# Patient Record
Sex: Female | Born: 1945
Health system: Southern US, Community
[De-identification: ages and names within clinical notes are randomized; demographics above are authoritative.]

## PROBLEM LIST (undated history)

## (undated) DIAGNOSIS — N952 Postmenopausal atrophic vaginitis: Secondary | ICD-10-CM

## (undated) DIAGNOSIS — M109 Gout, unspecified: Secondary | ICD-10-CM

## (undated) DIAGNOSIS — M797 Fibromyalgia: Secondary | ICD-10-CM

## (undated) DIAGNOSIS — N393 Stress incontinence (female) (male): Secondary | ICD-10-CM

## (undated) DIAGNOSIS — M1990 Unspecified osteoarthritis, unspecified site: Secondary | ICD-10-CM

## (undated) DIAGNOSIS — Z1371 Encounter for nonprocreative screening for genetic disease carrier status: Secondary | ICD-10-CM

## (undated) DIAGNOSIS — R0602 Shortness of breath: Secondary | ICD-10-CM

## (undated) DIAGNOSIS — H8109 Meniere's disease, unspecified ear: Secondary | ICD-10-CM

## (undated) DIAGNOSIS — G459 Transient cerebral ischemic attack, unspecified: Secondary | ICD-10-CM

## (undated) DIAGNOSIS — I499 Cardiac arrhythmia, unspecified: Secondary | ICD-10-CM

## (undated) DIAGNOSIS — T7840XA Allergy, unspecified, initial encounter: Secondary | ICD-10-CM

## (undated) DIAGNOSIS — K219 Gastro-esophageal reflux disease without esophagitis: Secondary | ICD-10-CM

## (undated) DIAGNOSIS — E785 Hyperlipidemia, unspecified: Secondary | ICD-10-CM

## (undated) DIAGNOSIS — R42 Dizziness and giddiness: Secondary | ICD-10-CM

## (undated) DIAGNOSIS — E119 Type 2 diabetes mellitus without complications: Secondary | ICD-10-CM

## (undated) DIAGNOSIS — Z803 Family history of malignant neoplasm of breast: Secondary | ICD-10-CM

## (undated) DIAGNOSIS — H269 Unspecified cataract: Secondary | ICD-10-CM

## (undated) DIAGNOSIS — Z87442 Personal history of urinary calculi: Secondary | ICD-10-CM

## (undated) DIAGNOSIS — R002 Palpitations: Secondary | ICD-10-CM

## (undated) DIAGNOSIS — K449 Diaphragmatic hernia without obstruction or gangrene: Secondary | ICD-10-CM

## (undated) DIAGNOSIS — I10 Essential (primary) hypertension: Secondary | ICD-10-CM

## (undated) DIAGNOSIS — I639 Cerebral infarction, unspecified: Secondary | ICD-10-CM

## (undated) HISTORY — PX: TUBAL LIGATION: SHX77

## (undated) HISTORY — DX: Encounter for nonprocreative screening for genetic disease carrier status: Z13.71

## (undated) HISTORY — DX: Postmenopausal atrophic vaginitis: N95.2

## (undated) HISTORY — DX: Hyperlipidemia, unspecified: E78.5

## (undated) HISTORY — DX: Gout, unspecified: M10.9

## (undated) HISTORY — PX: EYE SURGERY: SHX253

## (undated) HISTORY — DX: Unspecified osteoarthritis, unspecified site: M19.90

## (undated) HISTORY — DX: Family history of malignant neoplasm of breast: Z80.3

## (undated) HISTORY — DX: Stress incontinence (female) (male): N39.3

## (undated) HISTORY — PX: TONSILLECTOMY: SUR1361

## (undated) HISTORY — DX: Diaphragmatic hernia without obstruction or gangrene: K44.9

## (undated) HISTORY — PX: JOINT REPLACEMENT: SHX530

## (undated) HISTORY — PX: OOPHORECTOMY: SHX86

## (undated) HISTORY — DX: Allergy, unspecified, initial encounter: T78.40XA

## (undated) HISTORY — DX: Palpitations: R00.2

## (undated) HISTORY — PX: BREAST SURGERY: SHX581

## (undated) HISTORY — DX: Essential (primary) hypertension: I10

## (undated) HISTORY — PX: KNEE ARTHROSCOPY: SHX127

## (undated) HISTORY — PX: CHOLECYSTECTOMY: SHX55

## (undated) HISTORY — DX: Unspecified cataract: H26.9

## (undated) HISTORY — DX: Fibromyalgia: M79.7

## (undated) HISTORY — DX: Meniere's disease, unspecified ear: H81.09

## (undated) HISTORY — PX: SPINE SURGERY: SHX786

## (undated) HISTORY — DX: Transient cerebral ischemic attack, unspecified: G45.9

---

## 1992-05-31 HISTORY — PX: ABDOMINAL HYSTERECTOMY: SHX81

## 1992-05-31 HISTORY — PX: CYSTOCELE REPAIR: SHX163

## 1997-11-19 ENCOUNTER — Ambulatory Visit (HOSPITAL_COMMUNITY): Admission: RE | Admit: 1997-11-19 | Discharge: 1997-11-19 | Payer: Self-pay

## 1998-05-01 ENCOUNTER — Ambulatory Visit (HOSPITAL_COMMUNITY): Admission: RE | Admit: 1998-05-01 | Discharge: 1998-05-01 | Payer: Self-pay | Admitting: Gastroenterology

## 1998-07-24 ENCOUNTER — Ambulatory Visit (HOSPITAL_COMMUNITY): Admission: RE | Admit: 1998-07-24 | Discharge: 1998-07-24 | Payer: Self-pay | Admitting: Gastroenterology

## 1998-07-24 ENCOUNTER — Encounter: Payer: Self-pay | Admitting: Gastroenterology

## 1999-06-01 HISTORY — PX: CHOLECYSTECTOMY, LAPAROSCOPIC: SHX56

## 2000-02-10 ENCOUNTER — Encounter: Payer: Self-pay | Admitting: General Surgery

## 2000-02-11 ENCOUNTER — Ambulatory Visit (HOSPITAL_COMMUNITY): Admission: RE | Admit: 2000-02-11 | Discharge: 2000-02-12 | Payer: Self-pay | Admitting: General Surgery

## 2000-02-11 ENCOUNTER — Encounter (INDEPENDENT_AMBULATORY_CARE_PROVIDER_SITE_OTHER): Payer: Self-pay

## 2000-05-13 ENCOUNTER — Other Ambulatory Visit: Admission: RE | Admit: 2000-05-13 | Discharge: 2000-05-13 | Payer: Self-pay | Admitting: Gastroenterology

## 2000-05-13 ENCOUNTER — Encounter (INDEPENDENT_AMBULATORY_CARE_PROVIDER_SITE_OTHER): Payer: Self-pay | Admitting: Specialist

## 2001-05-31 HISTORY — PX: OTHER SURGICAL HISTORY: SHX169

## 2002-07-31 ENCOUNTER — Encounter: Payer: Self-pay | Admitting: General Surgery

## 2002-07-31 ENCOUNTER — Encounter: Admission: RE | Admit: 2002-07-31 | Discharge: 2002-07-31 | Payer: Self-pay | Admitting: General Surgery

## 2002-08-07 ENCOUNTER — Other Ambulatory Visit: Admission: RE | Admit: 2002-08-07 | Discharge: 2002-08-07 | Payer: Self-pay | Admitting: Obstetrics and Gynecology

## 2002-11-19 ENCOUNTER — Encounter: Payer: Self-pay | Admitting: Emergency Medicine

## 2002-11-19 ENCOUNTER — Emergency Department (HOSPITAL_COMMUNITY): Admission: EM | Admit: 2002-11-19 | Discharge: 2002-11-19 | Payer: Self-pay | Admitting: Emergency Medicine

## 2003-06-01 HISTORY — PX: CARPAL TUNNEL RELEASE: SHX101

## 2003-09-06 ENCOUNTER — Other Ambulatory Visit: Admission: RE | Admit: 2003-09-06 | Discharge: 2003-09-06 | Payer: Self-pay | Admitting: Obstetrics and Gynecology

## 2004-09-21 ENCOUNTER — Ambulatory Visit: Payer: Self-pay | Admitting: Internal Medicine

## 2004-09-23 ENCOUNTER — Other Ambulatory Visit: Admission: RE | Admit: 2004-09-23 | Discharge: 2004-09-23 | Payer: Self-pay | Admitting: Obstetrics and Gynecology

## 2004-09-28 ENCOUNTER — Ambulatory Visit: Payer: Self-pay | Admitting: Internal Medicine

## 2005-02-12 ENCOUNTER — Ambulatory Visit: Payer: Self-pay | Admitting: Internal Medicine

## 2005-05-18 ENCOUNTER — Encounter: Admission: RE | Admit: 2005-05-18 | Discharge: 2005-05-18 | Payer: Self-pay

## 2005-05-31 HISTORY — PX: OTHER SURGICAL HISTORY: SHX169

## 2005-09-28 ENCOUNTER — Other Ambulatory Visit: Admission: RE | Admit: 2005-09-28 | Discharge: 2005-09-28 | Payer: Self-pay | Admitting: Obstetrics and Gynecology

## 2005-11-25 ENCOUNTER — Ambulatory Visit: Payer: Self-pay | Admitting: Internal Medicine

## 2005-12-02 ENCOUNTER — Ambulatory Visit: Payer: Self-pay | Admitting: Internal Medicine

## 2006-02-25 ENCOUNTER — Emergency Department (HOSPITAL_COMMUNITY): Admission: EM | Admit: 2006-02-25 | Discharge: 2006-02-25 | Payer: Self-pay | Admitting: Family Medicine

## 2006-03-03 ENCOUNTER — Ambulatory Visit: Payer: Self-pay | Admitting: Internal Medicine

## 2006-03-17 ENCOUNTER — Inpatient Hospital Stay (HOSPITAL_COMMUNITY): Admission: RE | Admit: 2006-03-17 | Discharge: 2006-03-19 | Payer: Self-pay | Admitting: Orthopedic Surgery

## 2006-10-11 ENCOUNTER — Other Ambulatory Visit: Admission: RE | Admit: 2006-10-11 | Discharge: 2006-10-11 | Payer: Self-pay | Admitting: Obstetrics and Gynecology

## 2006-10-18 ENCOUNTER — Encounter: Admission: RE | Admit: 2006-10-18 | Discharge: 2006-10-18 | Payer: Self-pay | Admitting: Obstetrics and Gynecology

## 2006-10-18 ENCOUNTER — Encounter (INDEPENDENT_AMBULATORY_CARE_PROVIDER_SITE_OTHER): Payer: Self-pay | Admitting: Diagnostic Radiology

## 2006-12-22 ENCOUNTER — Ambulatory Visit: Payer: Self-pay | Admitting: Internal Medicine

## 2006-12-22 LAB — CONVERTED CEMR LAB
ALT: 56 units/L — ABNORMAL HIGH (ref 0–35)
AST: 41 units/L — ABNORMAL HIGH (ref 0–37)
Bacteria, UA: NEGATIVE
Bilirubin, Direct: 0.1 mg/dL (ref 0.0–0.3)
CO2: 27 meq/L (ref 19–32)
Calcium: 9.6 mg/dL (ref 8.4–10.5)
Chloride: 102 meq/L (ref 96–112)
Crystals: NEGATIVE
Eosinophils Relative: 1.9 % (ref 0.0–5.0)
GFR calc non Af Amer: 77 mL/min
Glucose, Bld: 126 mg/dL — ABNORMAL HIGH (ref 70–99)
HCT: 45.6 % (ref 36.0–46.0)
Hemoglobin, Urine: NEGATIVE
LDL Cholesterol: 118 mg/dL — ABNORMAL HIGH (ref 0–99)
MCV: 80.8 fL (ref 78.0–100.0)
Neutrophils Relative %: 51.4 % (ref 43.0–77.0)
RBC: 5.64 M/uL — ABNORMAL HIGH (ref 3.87–5.11)
RDW: 12.9 % (ref 11.5–14.6)
Total CHOL/HDL Ratio: 6.8
Total Protein, Urine: NEGATIVE mg/dL
WBC: 8.2 10*3/uL (ref 4.5–10.5)

## 2006-12-28 ENCOUNTER — Ambulatory Visit: Payer: Self-pay | Admitting: Internal Medicine

## 2007-05-04 ENCOUNTER — Ambulatory Visit: Payer: Self-pay | Admitting: Internal Medicine

## 2007-05-05 ENCOUNTER — Encounter: Payer: Self-pay | Admitting: Internal Medicine

## 2007-08-19 ENCOUNTER — Encounter: Payer: Self-pay | Admitting: *Deleted

## 2007-08-19 DIAGNOSIS — J45909 Unspecified asthma, uncomplicated: Secondary | ICD-10-CM | POA: Insufficient documentation

## 2007-08-19 DIAGNOSIS — J309 Allergic rhinitis, unspecified: Secondary | ICD-10-CM | POA: Insufficient documentation

## 2007-08-19 DIAGNOSIS — R5381 Other malaise: Secondary | ICD-10-CM | POA: Insufficient documentation

## 2007-08-19 DIAGNOSIS — K589 Irritable bowel syndrome without diarrhea: Secondary | ICD-10-CM | POA: Insufficient documentation

## 2007-08-19 DIAGNOSIS — K449 Diaphragmatic hernia without obstruction or gangrene: Secondary | ICD-10-CM

## 2007-08-19 DIAGNOSIS — K219 Gastro-esophageal reflux disease without esophagitis: Secondary | ICD-10-CM | POA: Insufficient documentation

## 2007-08-19 DIAGNOSIS — R5383 Other fatigue: Secondary | ICD-10-CM

## 2007-08-19 DIAGNOSIS — I10 Essential (primary) hypertension: Secondary | ICD-10-CM | POA: Insufficient documentation

## 2007-08-19 DIAGNOSIS — IMO0002 Reserved for concepts with insufficient information to code with codable children: Secondary | ICD-10-CM | POA: Insufficient documentation

## 2007-08-19 DIAGNOSIS — IMO0001 Reserved for inherently not codable concepts without codable children: Secondary | ICD-10-CM | POA: Insufficient documentation

## 2007-08-19 DIAGNOSIS — Z9089 Acquired absence of other organs: Secondary | ICD-10-CM | POA: Insufficient documentation

## 2007-08-19 DIAGNOSIS — Z9889 Other specified postprocedural states: Secondary | ICD-10-CM | POA: Insufficient documentation

## 2007-11-02 LAB — CONVERTED CEMR LAB

## 2007-11-24 ENCOUNTER — Other Ambulatory Visit: Admission: RE | Admit: 2007-11-24 | Discharge: 2007-11-24 | Payer: Self-pay | Admitting: Obstetrics and Gynecology

## 2007-11-29 ENCOUNTER — Encounter: Admission: RE | Admit: 2007-11-29 | Discharge: 2007-11-29 | Payer: Self-pay | Admitting: Obstetrics and Gynecology

## 2008-02-06 ENCOUNTER — Ambulatory Visit: Payer: Self-pay | Admitting: Internal Medicine

## 2008-02-06 LAB — CONVERTED CEMR LAB
ALT: 45 units/L — ABNORMAL HIGH (ref 0–35)
AST: 30 units/L (ref 0–37)
Basophils Absolute: 0 10*3/uL (ref 0.0–0.1)
Bilirubin Urine: NEGATIVE
Bilirubin, Direct: 0.2 mg/dL (ref 0.0–0.3)
CO2: 27 meq/L (ref 19–32)
Chloride: 105 meq/L (ref 96–112)
Eosinophils Absolute: 0.2 10*3/uL (ref 0.0–0.7)
GFR calc non Af Amer: 67 mL/min
Hemoglobin, Urine: NEGATIVE
Ketones, ur: NEGATIVE mg/dL
LDL Cholesterol: 93 mg/dL (ref 0–99)
Lymphocytes Relative: 34.6 % (ref 12.0–46.0)
MCHC: 34.4 g/dL (ref 30.0–36.0)
Mucus, UA: NEGATIVE
Neutrophils Relative %: 54.4 % (ref 43.0–77.0)
Platelets: 301 10*3/uL (ref 150–400)
RBC: 5.27 M/uL — ABNORMAL HIGH (ref 3.87–5.11)
RDW: 12.5 % (ref 11.5–14.6)
Sodium: 142 meq/L (ref 135–145)
Specific Gravity, Urine: 1.02 (ref 1.000–1.03)
TSH: 1.58 microintl units/mL (ref 0.35–5.50)
Total Bilirubin: 1.3 mg/dL — ABNORMAL HIGH (ref 0.3–1.2)
Total CHOL/HDL Ratio: 5.6
Triglycerides: 173 mg/dL — ABNORMAL HIGH (ref 0–149)
Urobilinogen, UA: 0.2 (ref 0.0–1.0)
VLDL: 35 mg/dL (ref 0–40)

## 2008-02-13 ENCOUNTER — Ambulatory Visit: Payer: Self-pay | Admitting: Internal Medicine

## 2008-02-13 DIAGNOSIS — R079 Chest pain, unspecified: Secondary | ICD-10-CM | POA: Insufficient documentation

## 2008-02-19 ENCOUNTER — Ambulatory Visit: Payer: Self-pay

## 2008-02-19 ENCOUNTER — Encounter: Payer: Self-pay | Admitting: Internal Medicine

## 2008-07-30 ENCOUNTER — Ambulatory Visit: Payer: Self-pay | Admitting: Internal Medicine

## 2008-07-30 DIAGNOSIS — H9190 Unspecified hearing loss, unspecified ear: Secondary | ICD-10-CM | POA: Insufficient documentation

## 2008-07-30 DIAGNOSIS — H8309 Labyrinthitis, unspecified ear: Secondary | ICD-10-CM

## 2008-07-30 DIAGNOSIS — H8109 Meniere's disease, unspecified ear: Secondary | ICD-10-CM | POA: Insufficient documentation

## 2008-08-06 ENCOUNTER — Encounter: Payer: Self-pay | Admitting: Internal Medicine

## 2008-08-08 ENCOUNTER — Encounter: Payer: Self-pay | Admitting: Internal Medicine

## 2008-09-09 ENCOUNTER — Encounter: Payer: Self-pay | Admitting: Internal Medicine

## 2008-09-12 ENCOUNTER — Encounter: Payer: Self-pay | Admitting: Internal Medicine

## 2008-09-18 ENCOUNTER — Encounter: Payer: Self-pay | Admitting: Internal Medicine

## 2008-10-30 LAB — CONVERTED CEMR LAB: Pap Smear: NORMAL

## 2008-11-25 ENCOUNTER — Other Ambulatory Visit: Admission: RE | Admit: 2008-11-25 | Discharge: 2008-11-25 | Payer: Self-pay | Admitting: Obstetrics and Gynecology

## 2008-11-25 ENCOUNTER — Ambulatory Visit: Payer: Self-pay | Admitting: Obstetrics and Gynecology

## 2008-11-25 ENCOUNTER — Encounter: Payer: Self-pay | Admitting: Obstetrics and Gynecology

## 2008-11-29 ENCOUNTER — Encounter: Admission: RE | Admit: 2008-11-29 | Discharge: 2008-11-29 | Payer: Self-pay | Admitting: Obstetrics and Gynecology

## 2008-12-05 ENCOUNTER — Encounter: Admission: RE | Admit: 2008-12-05 | Discharge: 2008-12-05 | Payer: Self-pay | Admitting: Obstetrics and Gynecology

## 2008-12-19 ENCOUNTER — Encounter: Admission: RE | Admit: 2008-12-19 | Discharge: 2008-12-19 | Payer: Self-pay | Admitting: Obstetrics and Gynecology

## 2009-02-03 ENCOUNTER — Emergency Department (HOSPITAL_COMMUNITY): Admission: EM | Admit: 2009-02-03 | Discharge: 2009-02-03 | Payer: Self-pay | Admitting: Family Medicine

## 2009-02-24 ENCOUNTER — Ambulatory Visit: Payer: Self-pay | Admitting: Internal Medicine

## 2009-02-24 LAB — CONVERTED CEMR LAB
AST: 23 units/L (ref 0–37)
Albumin: 4.1 g/dL (ref 3.5–5.2)
Alkaline Phosphatase: 76 units/L (ref 39–117)
Basophils Absolute: 0 10*3/uL (ref 0.0–0.1)
Bilirubin, Direct: 0.1 mg/dL (ref 0.0–0.3)
CO2: 29 meq/L (ref 19–32)
Calcium: 9 mg/dL (ref 8.4–10.5)
Creatinine, Ser: 0.7 mg/dL (ref 0.4–1.2)
Eosinophils Absolute: 0.2 10*3/uL (ref 0.0–0.7)
GFR calc non Af Amer: 89.46 mL/min (ref >60)
Glucose, Bld: 121 mg/dL — ABNORMAL HIGH (ref 70–99)
HDL: 28.8 mg/dL — ABNORMAL LOW (ref >39.00)
Hemoglobin: 14.1 g/dL (ref 12.0–15.0)
Ketones, ur: NEGATIVE mg/dL
Lymphocytes Relative: 35.9 % (ref 12.0–46.0)
MCHC: 33.6 g/dL (ref 30.0–36.0)
Monocytes Relative: 9.7 % (ref 3.0–12.0)
Neutrophils Relative %: 50.5 % (ref 43.0–77.0)
Platelets: 237 10*3/uL (ref 150.0–400.0)
RDW: 12.7 % (ref 11.5–14.6)
Sodium: 144 meq/L (ref 135–145)
Specific Gravity, Urine: <=1.005 (ref 1.000–1.030)
Total Bilirubin: 1.2 mg/dL (ref 0.3–1.2)
Total Protein, Urine: NEGATIVE mg/dL
Triglycerides: 150 mg/dL — ABNORMAL HIGH (ref 0.0–149.0)
Urine Glucose: NEGATIVE mg/dL
Urobilinogen, UA: 0.2 (ref 0.0–1.0)
VLDL: 30 mg/dL (ref 0.0–40.0)
pH: 6 (ref 5.0–8.0)

## 2009-02-28 ENCOUNTER — Ambulatory Visit: Payer: Self-pay | Admitting: Internal Medicine

## 2009-02-28 LAB — CONVERTED CEMR LAB: Hgb A1c MFr Bld: 6.5 % (ref 4.6–6.5)

## 2009-04-30 ENCOUNTER — Ambulatory Visit: Payer: Self-pay | Admitting: Obstetrics and Gynecology

## 2009-08-26 ENCOUNTER — Ambulatory Visit: Payer: Self-pay | Admitting: Internal Medicine

## 2010-03-19 ENCOUNTER — Encounter: Admission: RE | Admit: 2010-03-19 | Discharge: 2010-03-19 | Payer: Self-pay | Admitting: Obstetrics and Gynecology

## 2010-03-24 ENCOUNTER — Other Ambulatory Visit: Admission: RE | Admit: 2010-03-24 | Discharge: 2010-03-24 | Payer: Self-pay | Admitting: Obstetrics and Gynecology

## 2010-03-24 ENCOUNTER — Ambulatory Visit: Payer: Self-pay | Admitting: Obstetrics and Gynecology

## 2010-04-28 ENCOUNTER — Encounter: Payer: Self-pay | Admitting: Internal Medicine

## 2010-04-28 ENCOUNTER — Ambulatory Visit: Payer: Self-pay | Admitting: Internal Medicine

## 2010-04-28 DIAGNOSIS — E119 Type 2 diabetes mellitus without complications: Secondary | ICD-10-CM | POA: Insufficient documentation

## 2010-04-28 DIAGNOSIS — M109 Gout, unspecified: Secondary | ICD-10-CM | POA: Insufficient documentation

## 2010-04-28 LAB — CONVERTED CEMR LAB
Basophils Absolute: 0 10*3/uL (ref 0.0–0.1)
CO2: 28 meq/L (ref 19–32)
Calcium: 9.3 mg/dL (ref 8.4–10.5)
Creatinine, Ser: 0.8 mg/dL (ref 0.4–1.2)
Eosinophils Relative: 0.7 % (ref 0.0–5.0)
Glucose, Bld: 119 mg/dL — ABNORMAL HIGH (ref 70–99)
HDL: 38.4 mg/dL — ABNORMAL LOW (ref >39.00)
Hemoglobin: 16.1 g/dL — ABNORMAL HIGH (ref 12.0–15.0)
Hgb A1c MFr Bld: 6.5 % (ref 4.6–6.5)
Lymphocytes Relative: 27.6 % (ref 12.0–46.0)
Monocytes Relative: 6.7 % (ref 3.0–12.0)
Neutro Abs: 7.7 10*3/uL (ref 1.4–7.7)
RDW: 12.9 % (ref 11.5–14.6)
Total CHOL/HDL Ratio: 4
Triglycerides: 108 mg/dL (ref 0.0–149.0)
VLDL: 21.6 mg/dL (ref 0.0–40.0)
WBC: 11.9 10*3/uL — ABNORMAL HIGH (ref 4.5–10.5)

## 2010-04-29 ENCOUNTER — Encounter (INDEPENDENT_AMBULATORY_CARE_PROVIDER_SITE_OTHER): Payer: Self-pay | Admitting: *Deleted

## 2010-05-07 ENCOUNTER — Encounter: Payer: Self-pay | Admitting: Internal Medicine

## 2010-05-28 ENCOUNTER — Encounter (INDEPENDENT_AMBULATORY_CARE_PROVIDER_SITE_OTHER): Payer: Self-pay

## 2010-06-02 ENCOUNTER — Ambulatory Visit
Admission: RE | Admit: 2010-06-02 | Discharge: 2010-06-02 | Payer: Self-pay | Source: Home / Self Care | Attending: Gastroenterology | Admitting: Gastroenterology

## 2010-06-16 ENCOUNTER — Ambulatory Visit
Admission: RE | Admit: 2010-06-16 | Discharge: 2010-06-16 | Payer: Self-pay | Source: Home / Self Care | Attending: Gastroenterology | Admitting: Gastroenterology

## 2010-06-16 ENCOUNTER — Other Ambulatory Visit: Payer: Self-pay | Admitting: Gastroenterology

## 2010-06-22 ENCOUNTER — Encounter: Payer: Self-pay | Admitting: Obstetrics and Gynecology

## 2010-06-22 ENCOUNTER — Encounter: Payer: Self-pay | Admitting: Gastroenterology

## 2010-06-30 NOTE — Letter (Signed)
Summary: Pre Visit Letter Revised  [facility]  [address] [place], [place] [address]   Phone: [phone]  Fax: [phone]        04/29/2010 MRN: [id number] [facility] [address] [place], [place]  [address]             Procedure Date:  06/16/2010  Welcome to the Gastroenterology Division at [facility].    You are scheduled to see a nurse for your pre-procedure visit on 06/02/2010 at 2:00pm on the 3rd floor at [facility], [address]  We ask that you try to arrive at our office 15 minutes prior to your appointment time to allow for check-in.  Please take a minute to review the attached form.  If you answer "Yes" to one or more of the questions on the first page, we ask that you call the person listed at your earliest opportunity.  If you answer "No" to all of the questions, please complete the rest of the form and bring it to your appointment.    Your nurse visit will consist of discussing your medical and surgical history, your immediate family medical history, and your medications.   If you are unable to list all of your medications on the form, please bring the medication bottles to your appointment and we will list them.  We will need to be aware of both prescribed and over the counter drugs.  We will need to know exact dosage information as well.    Please be prepared to read and sign documents such as consent forms, a financial agreement, and acknowledgement forms.  If necessary, and with your consent, a friend or relative is welcome to sit-in on the nurse visit with you.  Please bring your insurance card so that we may make a copy of it.  If your insurance requires a referral to see a specialist, please bring your referral form from your primary care physician.  No co-pay is required for this nurse visit.     If you cannot keep your appointment, please call [phone] to cancel or reschedule prior to your appointment date.  This  allows [place] the opportunity to schedule an appointment for another patient in need of care.    Thank you for choosing [facility] for your medical needs.  We appreciate the opportunity to care for you.  Please visit [place] at our website  to learn more about our practice.  Sincerely, The Gastroenterology Division

## 2010-06-30 NOTE — Assessment & Plan Note (Signed)
Summary: YEARLY--STC   Vital Signs:  Patient profile:   65 year old female Height:      65 inches Weight:      207 pounds BMI:     34.57 O2 Sat:      97 % on Room air Temp:     98.3 degrees F oral Pulse rate:   88 / minute BP sitting:   112 / 84  (left arm) Cuff size:   large  Vitals Entered By: Bill Salinas CMA (April 28, 2010 9:46 AM)  O2 Flow:  Room air  Primary Care Provider:  Jacques Navy MD   History of Present Illness: Interval history - gout in right foot diagnosed by Dr. Dierdre Forth. She has seen her gynecologist and had a normal exam. she has had a normal mammogram. she reports that she has been feeling a little draggy. She is worried about her blood sugar.   She remains independent in all ADLs. She has no signs of depression. Still managing all the household finances and helps with her sons financial paperwork.   She has had a couple of falls - more loss of footing. No LOC. She still has some dizziness but better since stopping nabumetone. She still will have balance problems. She had brain imaging for balance problems  10 years + ago with no changes noted.   Preventive Screening-Counseling & Management  Alcohol-Tobacco     Alcohol drinks/day: <1     >5/day in last 3 mos: yes     Smoking Status: never  Caffeine-Diet-Exercise     Caffeine use/day: only on occasions     Diet Comments: on a healthy low sugar diet     Does Patient Exercise: no  Hep-HIV-STD-Contraception     Hepatitis Risk: no risk noted     HIV Risk: no risk noted     STD Risk: no risk noted     Dental Visit-last 6 months yes     Sun Exposure-Excessive: no  Safety-Violence-Falls     Seat Belt Use: yes     Helmet Use: n/a     Firearms in the Home: firearms in the home     Smoke Detectors: yes     Violence in the Home: no risk noted     Sexual Abuse: no     Fall Risk: moderate fall risk      Sexual History:  currently monogamous.        Drug Use:  never.        Blood Transfusions:   no.    Current Medications (verified): 1)  Diltiazem Hcl Cr 240 Mg Cp24 (Diltiazem Hcl) .... Take 1 Tablet By Mouth Once A Day 2)  Fexofenadine Hcl 180 Mg Tabs (Fexofenadine Hcl) .... Take 1 Tablet By Mouth Once A Day 3)  Furosemide 40 Mg Tabs (Furosemide) .... Take 1 Tablet By Mouth Once A Day 4)  Nortriptyline Hcl 25 Mg  Caps (Nortriptyline Hcl) .... Take 3 Tablets At Bedtime 5)  Astelin 137 Mcg/spray  Soln (Azelastine Hcl) .... Use Two Times A Day 6)  Nasacort Aq 55 Mcg/act  Aers (Triamcinolone Acetonide(Nasal)) .... Use Once Daily 7)  Premarin 0.625 Mg/gm  Crea (Estrogens, Conjugated) .... Use 3 Times Weekly 8)  Patanol 0.1 %  Soln (Olopatadine Hcl) .... Use Daily As Directed. 9)  Omeprazole 40 Mg Cpdr (Omeprazole) .Marland Kitchen.. 1 By Mouth Q Am 10)  Ranitidine Hcl 150 Mg  Caps (Ranitidine Hcl) .... Take One Pm 11)  Epipen 0.3 Mg/0.21ml (  1:1000)  Devi (Epinephrine Hcl (Anaphylaxis)) .... Use As Needed and As Directed. 12)  Proair Hfa 108 (90 Base) Mcg/act Aers (Albuterol Sulfate) .... Inhale 1-2 Puffs As Needed 13)  Losartan Potassium 50 Mg Tabs (Losartan Potassium) .Marland Kitchen.. 1 By Mouth Q Am 14)  Flexeril 5 Mg Tabs (Cyclobenzaprine Hcl) .... 1/2 To 1 Tab At Bedtime 15)  Levocetirizine Dihydrochloride 5 Mg Tabs (Levocetirizine Dihydrochloride) .Marland Kitchen.. 1 Tablet Daily 16)  Dulera 100-5 Mcg/act Aero (Mometasone Furo-Formoterol Fum) .... 2 Puffs Two Times A Day  Allergies (verified): 1)  ! * Ivp Dye 2)  ! Iodine 3)  ! Sulfa  Past History:  Past Medical History: Last updated: 08/19/2007 ACID REFLUX DISEASE (ICD-530.81) * T11-12 DISK HERNIATION,L3-4 DISK HERNIATION FATIGUE, CHRONIC (ICD-780.79) FIBROMYALGIA (ICD-729.1) CATARACTS PROGRESSIVE (ICD-366.9) DEGENERATIVE DISC DISEASE (ICD-722.6) IRRITABLE BOWEL SYNDROME (ICD-564.1) ASTHMA (ICD-493.90) HIATAL HERNIA (ICD-553.3) ALLERGIC RHINITIS (ICD-477.9) HYPERTENSION (ICD-401.9) OTHER ABNORMAL BLOOD CHEMISTRY (ICD-790.6)  Past Surgical History: Last  updated: 08/19/2007 * REPAIR OF TORN POSTERIOR TIBAL TENDON LEFT FOOT * 4 SCREWS IN LEFT ANKE AFTER FRACTURE. BREAST BIOPSY, HX OF BENIGN (ICD-V15.9) CARPAL TUNNEL RELEASE, RIGHT, HX OF (ICD-V45.89) CHOLECYSTECTOMY, LAPAROSCOPIC, HX OF (ICD-V45.79) * TAH WITH BILATERAL SALPING-OOPHORECTOMY  Family History: Last updated: 11-Mar-2008 Father - deceased @ 43: malignant brain tumors Mother - deceased @ 71: h/o breast cancer; developed Sq cell carcinoma Neg-colon cancer; CAD Sister - breast cancer survivor 2nd kin with DM  Social History: Last updated: 03/11/08 HSG married '69 2 sons - '70, '72:  retired - [employer] SO- good health  Social History: Caffeine use/day:  only on occasions Does Patient Exercise:  no Dental Care w/in 6 mos.:  yes Sun Exposure-Excessive:  no Seat Belt Use:  yes Fall Risk:  moderate fall risk Hepatitis Risk:  no risk noted HIV Risk:  no risk noted STD Risk:  no risk noted Sexual History:  currently monogamous Drug Use:  never Blood Transfusions:  no  Review of Systems  The patient denies anorexia, fever, weight loss, weight gain, vision loss, decreased hearing, chest pain, syncope, dyspnea on exertion, prolonged cough, abdominal pain, severe indigestion/heartburn, incontinence, muscle weakness, difficulty walking, depression, and enlarged lymph nodes.         reflux controlled on PPI therapy  Physical Exam  General:  Well-developed,well-nourished,in no acute distress; alert,appropriate and cooperative throughout examination Head:  Normocephalic and atraumatic without obvious abnormalities. No apparent alopecia or balding. Eyes:  vision grossly intact, pupils equal, pupils round, and corneas and lenses clear.   Ears:  External ear exam shows no significant lesions or deformities.  Otoscopic examination reveals clear canals, tympanic membranes are intact bilaterally without bulging, retraction, inflammation or discharge. Hearing is grossly normal  bilaterally. Nose:  no external deformity and no external erythema.   Mouth:  Oral mucosa and oropharynx without lesions or exudates.  Teeth in good repair. Neck:  supple, full ROM, and no thyromegaly.   Chest Wall:  No deformities, masses, or tenderness noted. Breasts:  deferred to gyn Lungs:  Normal respiratory effort, chest expands symmetrically. Lungs are clear to auscultation, no crackles or wheezes. Heart:  normal rate, regular rhythm, no murmur, no gallop, and no JVD.   Abdomen:  soft, non-tender, normal bowel sounds, no guarding, and no hepatomegaly.   Genitalia:  deferred to gyn Msk:  normal ROM, no joint tenderness, no joint swelling, no joint warmth, and no redness over joints.  Right great toe looks normal Pulses:  2+ radial and DP pulses Extremities:  No clubbing, cyanosis, edema,  or deformity noted with normal full range of motion of all joints.   Neurologic:  alert & oriented X3, cranial nerves II-XII intact, sensation intact to pinprick, and DTRs symmetrical and normal.   Skin:  turgor normal, color normal, no rashes, and no suspicious lesions.  Surgical scar dorsum left foot Cervical Nodes:  no anterior cervical adenopathy and no posterior cervical adenopathy.   Psych:  Oriented X3, memory intact for recent and remote, normally interactive, good eye contact, and not anxious appearing.     Impression & Recommendations:  Problem # 1:  OTHER ABNORMAL GLUCOSE (ICD-790.29) h/o elevated blood sugar.  Plam - f/u lab.   Addendum - A1C 6.5% - consistent with diagnosis of diabetes, however below treatment goal of A1C 7% or less  Plan - life-style management with no sugar, low carb diet.  Problem # 2:  ACID REFLUX DISEASE (ICD-530.81) Patient's symptoms are well controlled on her medical regimen  Her updated medication list for this problem includes:    Omeprazole 40 Mg Cpdr (Omeprazole) .Marland Kitchen... 1 by mouth q am    Ranitidine Hcl 150 Mg Caps (Ranitidine hcl) .Marland Kitchen... Take one  pm  Problem # 3:  GOUT, UNSPECIFIED (ICD-274.9) Patient has not had any recent gout flares.  Plan - check Uric acid.  Orders: TLB-Uric Acid, Blood (84550-URIC)  Addendum - Uric Acid 7.2, just above normal range.  Plan - low purine diet          symptomatic treatment as necessary          if greater than two flares of gout n 12 months will consider adding prophylactic medication.  Problem # 4:  IRRITABLE BOWEL SYNDROME (ICD-564.1) stable without current complaints.  Plan - continue high fiber diet.  Problem # 5:  ASTHMA (ICD-493.90) Patients symptoms are well controlled. she does not use Albuterol rescue but every few months.  PLan - continue present regimen of dulera daily  Her updated medication list for this problem includes:    Proair Hfa 108 (90 Base) Mcg/act Aers (Albuterol sulfate) ..... Inhale 1-2 puffs as needed    Dulera 100-5 Mcg/act Aero (Mometasone furo-formoterol fum) .Marland Kitchen... 2 puffs two times a day  Problem # 6:  HYPERTENSION (ICD-401.9)  Her updated medication list for this problem includes:    Diltiazem Hcl Cr 240 Mg Cp24 (Diltiazem hcl) .Marland Kitchen... Take 1 tablet by mouth once a day    Furosemide 40 Mg Tabs (Furosemide) .Marland Kitchen... Take 1 tablet by mouth once a day    Losartan Potassium 50 Mg Tabs (Losartan potassium) .Marland Kitchen... 1 by mouth q am  Orders: TLB-BMP (Basic Metabolic Panel-BMET) (80048-METABOL)  BP today: 112/84 Prior BP: 122/80 (08/26/2009)  Good control. Will continue present medications.  Problem # 7:  LABYRINTHITIS, CHRONIC (ICD-386.30) A chronic problem that does affect her gait. As noted she has had neuroimaging in the past. Her neuro exam is non-focal.  Plan - counselled on fall risk           continue with meclizine prn  Problem # 8:  Preventive Health Care (ICD-V70.0) Interval hisotry is unremarkable. Physical exam today is normal. Lab results are in normal limits including LDL cholesterol of 104. Last mammogram on record is July '10; last  colonoscopy May '04. Immunizations - Flu  Sept '11, Pneumonia vaccine Sept '11. She should consider singles vaccine and will check her coverage. 12 Lead EKG negative for any signs of ischemia.  In summary - a very nice woman with multiple problems that all  seem stable at this time. She is current with health maintenance butis due for mammogram and shingles vaccine. She will return 1 year or as needed.   Orders: TLB-Lipid Panel (80061-LIPID) Welcome to Medicare, Physical [phone])  Complete Medication List: 1)  Diltiazem Hcl Cr 240 Mg Cp24 (Diltiazem hcl) .... Take 1 tablet by mouth once a day 2)  Fexofenadine Hcl 180 Mg Tabs (Fexofenadine hcl) .... Take 1 tablet by mouth once a day 3)  Furosemide 40 Mg Tabs (Furosemide) .... Take 1 tablet by mouth once a day 4)  Nortriptyline Hcl 25 Mg Caps (Nortriptyline hcl) .... Take 3 tablets at bedtime 5)  Astelin 137 Mcg/spray Soln (Azelastine hcl) .... Use two times a day 6)  Nasacort Aq 55 Mcg/act Aers (Triamcinolone acetonide(nasal)) .... Use once daily 7)  Premarin 0.625 Mg/gm Crea (Estrogens, conjugated) .... Use 3 times weekly 8)  Patanol 0.1 % Soln (Olopatadine hcl) .... Use daily as directed. 9)  Omeprazole 40 Mg Cpdr (Omeprazole) .Marland Kitchen.. 1 by mouth q am 10)  Ranitidine Hcl 150 Mg Caps (Ranitidine hcl) .... Take one pm 11)  Epipen 0.3 Mg/0.57ml (1:1000) Devi (Epinephrine hcl (anaphylaxis)) .... Use as needed and as directed. 12)  Proair Hfa 108 (90 Base) Mcg/act Aers (Albuterol sulfate) .... Inhale 1-2 puffs as needed 13)  Losartan Potassium 50 Mg Tabs (Losartan potassium) .Marland Kitchen.. 1 by mouth q am 14)  Flexeril 5 Mg Tabs (Cyclobenzaprine hcl) .... 1/2 to 1 tab at bedtime 15)  Levocetirizine Dihydrochloride 5 Mg Tabs (Levocetirizine dihydrochloride) .Marland Kitchen.. 1 tablet daily 16)  Dulera 100-5 Mcg/act Aero (Mometasone furo-formoterol fum) .... 2 puffs two times a day  Other Orders: TLB-CBC Platelet - w/Differential (85025-CBCD) TLB-A1C / Hgb A1C  (Glycohemoglobin) (83036-A1C) Gastroenterology Referral (GI) EKG w/ Interpretation (93000)  Patient: Kristy [name] Note: All result statuses are Final unless otherwise noted.  Tests: (1) CBC Platelet w/Diff (CBCD)   White Cell Count     [H]  11.9 K/uL                   4.5-10.5   Red Cell Count       [H]  5.51 Mil/uL                 3.87-5.11   Hemoglobin           [H]  16.1 g/dL                   82.9-56.2   Hematocrit           [H]  47.0 %                      36.0-46.0   MCV                       85.2 fl                     78.0-100.0   MCHC                      34.3 g/dL                   13.0-86.5   RDW                       12.9 %  11.5-14.6   Platelet Count            305.0 K/uL                  150.0-400.0   Neutrophil %              64.7 %                      43.0-77.0   Lymphocyte %              27.6 %                      12.0-46.0   Monocyte %                6.7 %                       3.0-12.0   Eosinophils%              0.7 %                       0.0-5.0   Basophils %               0.3 %                       0.0-3.0   Neutrophill Absolute      7.7 K/uL                    1.4-7.7   Lymphocyte Absolute       3.3 K/uL                    0.7-4.0   Monocyte Absolute         0.8 K/uL                    0.1-1.0  Eosinophils, Absolute                             0.1 K/uL                    0.0-0.7   Basophils Absolute        0.0 K/uL                    0.0-0.1  Tests: (2) Hemoglobin A1C (A1C)   Hemoglobin A1C            6.5 %                       4.6-6.5     Glycemic Control Guidelines for People with Diabetes:     Non Diabetic:  <6%     Goal of Therapy: <7%     Additional Action Suggested:  >8%   Tests: (3) Lipid Panel (LIPID)   Cholesterol               164 mg/dL                   6-962     ATP III Classification            Desirable:  < 200 mg/dL                    Borderline High:  200 - 239 mg/dL               High:  > = 240 mg/dL    Triglycerides             108.0 mg/dL                 6.2-130.8     Normal:  <150 mg/dL     Borderline High:  657 - 199 mg/dL   HDL                  [L]  84.69 mg/dL                 >62.95   VLDL Cholesterol          21.6 mg/dL                  2.8-41.3   LDL Cholesterol      [H]  244 mg/dL                   0-10  CHO/HDL Ratio:  CHD Risk                             4                    Men          Women     1/2 Average Risk     3.4          3.3     Average Risk          5.0          4.4     2X Average Risk          9.6          7.1     3X Average Risk          15.0          11.0                           Tests: (4) BMP (METABOL)   Sodium                    139 mEq/L                   135-145   Potassium                 4.1 mEq/L                   3.5-5.1   Chloride                  100 mEq/L                   96-112   Carbon Dioxide            28 mEq/L                    19-32   Glucose              [H]  119 mg/dL                   27-25   BUN  20 mg/dL                    0-86   Creatinine                0.8 mg/dL                   5.7-8.4   Calcium                   9.3 mg/dL                   6.9-62.9   GFR                       76.41 mL/min                >60  Tests: (5) Uric Acid (URIC)   Uric Acid            [H]  7.2 mg/dL                   [id number]: DILTIAZEM HCL CR 240 MG CP24 (DILTIAZEM HCL) Take 1 tablet by mouth once a day  #30 Capsule x 12   Entered and Authorized by:   Jacques Navy MD   Signed by:   Jacques Navy MD on 04/28/2010   Method used:   Electronically to        [facility]  [address] [id number] (retail)       [address]       [place], [place]  [address]       Ph: [phone] or [phone]       Fax: [phone]   RxID:   [id number]    Orders Added: 1)  TLB-CBC Platelet - w/Differential [85025-CBCD] 2)  TLB-A1C / Hgb A1C (Glycohemoglobin) [83036-A1C] 3)  TLB-Lipid Panel [80061-LIPID] 4)   TLB-BMP (Basic Metabolic Panel-BMET) [80048-METABOL] 5)  TLB-Uric Acid, Blood [84550-URIC] 6)  Gastroenterology Referral [GI] 7)  Welcome to Medicare, Physical [G0402] 8)  Est. Patient Level IV [27062] 9)  EKG w/ Interpretation [93000]   Immunization History:  Influenza Immunization History:    Influenza:  historical (02/20/2010)  Pneumovax Immunization History:    Pneumovax:  pneumovax (02/20/2010)   Immunization History:  Influenza Immunization History:    Influenza:  Historical (02/20/2010)  Pneumovax Immunization History:    Pneumovax:  Pneumovax (02/20/2010)

## 2010-06-30 NOTE — Assessment & Plan Note (Signed)
Summary: 6 MO ROV /NWS  #   Vital Signs:  Patient profile:   65 year old female Height:      65 inches (165.10 cm) Weight:      201.25 pounds (91.48 kg) BMI:     33.61 O2 Sat:      97 % on Room air Temp:     98.0 degrees F (36.67 degrees C) oral Pulse rate:   96 / minute Pulse rhythm:   regular BP sitting:   122 / 80  (left arm) Cuff size:   large  Vitals Entered By: Brenton Grills (August 26, 2009 1:10 PM)  O2 Flow:  Room air CC: 6 mo f/u-pt also to discuss nabumetone/ranitidine/nexium/aj   Primary Care Provider:  Jacques Navy MD  CC:  6 mo f/u-pt also to discuss nabumetone/ranitidine/nexium/aj.  History of Present Illness: Patient presents for follow-up of dyspepsia/GERD. She has tried H2 blocker therapy instead of PPI treatment and reports that she is having a lot of pain and discomfort. She is interested in resuming the use of PPI - generic.   She has seen Dr. Dierdre Forth - a new rheumatologist at [facility] (he has taken over Dr. Renaldo Reel patients). She continues to suffer with fibrmyalgia. She is having on-going back trouble for which she exercises and she is taking a new muscle relaxant - cyclobenzaprine. She has stopped Nabumetone and had resolution of her dizziness.   She has had an annual evaluation with Dr. Jethro Bolus - she had a rough winter but is now doing well.   Blood pressure has been well controlled on several visits to different doctors. She is tolerating the medication well.   Current Medications (verified): 1)  Diltiazem Hcl Cr 240 Mg Cp24 (Diltiazem Hcl) .... Take 1 Tablet By Mouth Once A Day 2)  Fexofenadine Hcl 180 Mg Tabs (Fexofenadine Hcl) .... Take 1 Tablet By Mouth Once A Day 3)  Furosemide 40 Mg Tabs (Furosemide) .... Take 1 Tablet By Mouth Once A Day 4)  Nortriptyline Hcl 25 Mg  Caps (Nortriptyline Hcl) .... Take 3 Tablets At Bedtime 5)  Astelin 137 Mcg/spray  Soln (Azelastine Hcl) .... Use Two Times A Day 6)  Nasacort Aq 55 Mcg/act  Aers  (Triamcinolone Acetonide(Nasal)) .... Use Once Daily 7)  Premarin 0.625 Mg/gm  Crea (Estrogens, Conjugated) .... Use 3 Times Weekly 8)  Patanol 0.1 %  Soln (Olopatadine Hcl) .... Use Daily As Directed. 9)  Nabumetone 750 Mg  Tabs (Nabumetone) .... Take 2  Tablets At Bedtime 10)  Nexium 40 Mg  Cpdr (Esomeprazole Magnesium) .... Take One Am 11)  Ranitidine Hcl 150 Mg  Caps (Ranitidine Hcl) .... Take One Pm 12)  Epipen 0.3 Mg/0.71ml (1:1000)  Devi (Epinephrine Hcl (Anaphylaxis)) .... Use As Needed and As Directed. 13)  Proair Hfa 108 (90 Base) Mcg/act Aers (Albuterol Sulfate) .... Inhale 1-2 Puffs As Needed 14)  Losartan Potassium 50 Mg Tabs (Losartan Potassium) .Marland Kitchen.. 1 By Mouth Q Am 15)  Flexeril 5 Mg Tabs (Cyclobenzaprine Hcl) .... 1/2 To 1 Tab At Bedtime  Allergies (verified): 1)  ! * Ivp Dye 2)  ! Iodine 3)  ! Sulfa  Past History:  Past Medical History: Last updated: 08/19/2007 ACID REFLUX DISEASE (ICD-530.81) * T11-12 DISK HERNIATION,L3-4 DISK HERNIATION FATIGUE, CHRONIC (ICD-780.79) FIBROMYALGIA (ICD-729.1) CATARACTS PROGRESSIVE (ICD-366.9) DEGENERATIVE DISC DISEASE (ICD-722.6) IRRITABLE BOWEL SYNDROME (ICD-564.1) ASTHMA (ICD-493.90) HIATAL HERNIA (ICD-553.3) ALLERGIC RHINITIS (ICD-477.9) HYPERTENSION (ICD-401.9) OTHER ABNORMAL BLOOD CHEMISTRY (ICD-790.6)  Past Surgical History: Last updated: 08/19/2007 *  REPAIR OF TORN POSTERIOR TIBAL TENDON LEFT FOOT * 4 SCREWS IN LEFT ANKE AFTER FRACTURE. BREAST BIOPSY, HX OF BENIGN (ICD-V15.9) CARPAL TUNNEL RELEASE, RIGHT, HX OF (ICD-V45.89) CHOLECYSTECTOMY, LAPAROSCOPIC, HX OF (ICD-V45.79) * TAH WITH BILATERAL SALPING-OOPHORECTOMY  Family History: Last updated: 02-24-08 Father - deceased @ 40: malignant brain tumors Mother - deceased @ 54: h/o breast cancer; developed Sq cell carcinoma Neg-colon cancer; CAD Sister - breast cancer survivor 2nd kin with DM  Social History: Last updated: 2008/02/24 HSG married '69 2 sons -  '70, '72:  retired - [employer]- good health  Review of Systems  The patient denies anorexia, fever, weight loss, weight gain, decreased hearing, hoarseness, dyspnea on exertion, peripheral edema, prolonged cough, abdominal pain, hematochezia, incontinence, muscle weakness, suspicious skin lesions, difficulty walking, depression, enlarged lymph nodes, and angioedema.    Physical Exam  General:  WNWD overeweight white female in no distress.  Head:  normocephalic, atraumatic, and no abnormalities observed.   Eyes:  pupils equal, pupils round, corneas and lenses clear, and no injection.   Ears:  R ear normal and L ear normal.   Mouth:  good dentition and pharynx pink and moist.   Neck:  supple, full ROM, and no thyromegaly.   Lungs:  Normal respiratory effort, chest expands symmetrically. Lungs are clear to auscultation, no crackles or wheezes. Heart:  normal rate, regular rhythm, no murmur, and no gallop.   Abdomen:  obese, soft, BS +, mild tenderness in the epigastrum Msk:  normal ROM, no joint tenderness, no joint swelling, and no joint deformities.   Pulses:  2+ radial pulses Neurologic:  alert & oriented X3, cranial nerves II-XII intact, strength normal in all extremities, sensation intact to light touch, and gait normal.   Skin:  turgor normal, color normal, and no ulcerations.   Cervical Nodes:  no anterior cervical adenopathy and no posterior cervical adenopathy.   Psych:  Oriented X3, memory intact for recent and remote, normally interactive, and good eye contact.     Impression & Recommendations:  Problem # 1:  ACID REFLUX DISEASE (ICD-530.81) Poor control with H2 blocker alone  Plan - resume PPI therapy while continuing PM H2 blocker.  Her updated medication list for this problem includes:    Omeprazole 40 Mg Cpdr (Omeprazole) .Marland Kitchen... 1 by mouth q am    Ranitidine Hcl 150 Mg Caps (Ranitidine hcl) .Marland Kitchen... Take one pm  Problem # 2:  FIBROMYALGIA (ICD-729.1) Patient has seen Dr.  Dierdre Forth, her new rheumatologist. She is doing OK and has not seen a real decline or increase in symptoms off of nabumetone.  Plan - defer medication management to Dr. Dierdre Forth.   The following medications were removed from the medication list:    Nabumetone 750 Mg Tabs (Nabumetone) .Marland Kitchen... Take 2  tablets at bedtime Her updated medication list for this problem includes:    Flexeril 5 Mg Tabs (Cyclobenzaprine hcl) .Marland Kitchen... 1/2 to 1 tab at bedtime  Problem # 3:  HYPERTENSION (ICD-401.9)  Her updated medication list for this problem includes:    Diltiazem Hcl Cr 240 Mg Cp24 (Diltiazem hcl) .Marland Kitchen... Take 1 tablet by mouth once a day    Furosemide 40 Mg Tabs (Furosemide) .Marland Kitchen... Take 1 tablet by mouth once a day    Losartan Potassium 50 Mg Tabs (Losartan potassium) .Marland Kitchen... 1 by mouth q am  BP today: 122/80 Prior BP: 146/94 (02/28/2009)  Labs Reviewed: K+: 3.9 (02/24/2009) Creat: : 0.7 (02/24/2009)   Chol: 179 (02/24/2009)   HDL: 28.80 (  02/24/2009)   LDL: 120 (02/24/2009)   TG: 150.0 (02/24/2009)  Good control on present medication.  Complete Medication List: 1)  Diltiazem Hcl Cr 240 Mg Cp24 (Diltiazem hcl) .... Take 1 tablet by mouth once a day 2)  Fexofenadine Hcl 180 Mg Tabs (Fexofenadine hcl) .... Take 1 tablet by mouth once a day 3)  Furosemide 40 Mg Tabs (Furosemide) .... Take 1 tablet by mouth once a day 4)  Nortriptyline Hcl 25 Mg Caps (Nortriptyline hcl) .... Take 3 tablets at bedtime 5)  Astelin 137 Mcg/spray Soln (Azelastine hcl) .... Use two times a day 6)  Nasacort Aq 55 Mcg/act Aers (Triamcinolone acetonide(nasal)) .... Use once daily 7)  Premarin 0.625 Mg/gm Crea (Estrogens, conjugated) .... Use 3 times weekly 8)  Patanol 0.1 % Soln (Olopatadine hcl) .... Use daily as directed. 9)  Omeprazole 40 Mg Cpdr (Omeprazole) .Marland Kitchen.. 1 by mouth q am 10)  Ranitidine Hcl 150 Mg Caps (Ranitidine hcl) .... Take one pm 11)  Epipen 0.3 Mg/0.96ml (1:1000) Devi (Epinephrine hcl (anaphylaxis)) .... Use as  needed and as directed. 12)  Proair Hfa 108 (90 Base) Mcg/act Aers (Albuterol sulfate) .... Inhale 1-2 puffs as needed 13)  Losartan Potassium 50 Mg Tabs (Losartan potassium) .Marland Kitchen.. 1 by mouth q am 14)  Flexeril 5 Mg Tabs (Cyclobenzaprine hcl) .... 1/2 to 1 tab at bedtime Prescriptions: RANITIDINE HCL 150 MG  CAPS (RANITIDINE HCL) Take one pm  #30 x 12   Entered and Authorized by:   Jacques Navy MD   Signed by:   Jacques Navy MD on 08/26/2009   Method used:   Electronically to        [facility] [id number] (retail)       [address]       [place], [place]  [address]       Ph: [phone] or [phone]       Fax: [phone]   RxID:   [id number] OMEPRAZOLE 40 MG CPDR (OMEPRAZOLE) 1 by mouth q AM  #30 x 12   Entered and Authorized by:   Jacques Navy MD   Signed by:   Jacques Navy MD on 08/26/2009   Method used:   Electronically to        [id number] ([facility])       [address]       [place], [place]  [address]       Ph: [phone] or [phone]       Fax: [phone]   RxID:   [id number]      Immunization History:  Influenza Immunization History:    Influenza:  historical (02/28/2009)

## 2010-07-02 NOTE — Miscellaneous (Signed)
Summary: Lec previsit  Clinical Lists Changes  Medications: Added new medication of MOVIPREP 100 GM  SOLR (PEG-KCL-NACL-NASULF-NA ASC-C) As per prep instructions. - Signed Rx of MOVIPREP 100 GM  SOLR (PEG-KCL-NACL-NASULF-NA ASC-C) As per prep instructions.;  #1 x 0;  Signed;  Entered by: Ulis Rias RN;  Authorized by: Louis Meckel MD;  Method used: Electronically to [id number] [address] [place], [place]  [address], Ph: [phone] or [phone], Fax: [phone] Observations: Added new observation of ALLERGY REV: Done (06/02/2010 13:41)    Prescriptions: MOVIPREP 100 GM  SOLR (PEG-KCL-NACL-NASULF-NA ASC-C) As per prep instructions.  #1 x 0   Entered by:   Ulis Rias RN   Authorized by:   Louis Meckel MD   Signed by:   Ulis Rias RN on 06/02/2010   Method used:   [facility]ronically to        [id number]       [address]       [place], [place]  [address]       Ph: [phone] or [phone]       Fax: [phone]   RxID:   [id number]

## 2010-07-02 NOTE — Letter (Signed)
Summary: Patient Notice- Polyp Results  [facility]  [address] [place], [place] [address]   Phone: [phone]  Fax: [phone]        June 22, 2010 MRN: [id number]    [facility] [place] [address] [place], [place]  [address]    Dear Ms. Thoennes,  I am pleased to inform you that the colon polyp(s) removed during your recent colonoscopy was (were) found to be benign (no cancer detected) upon pathologic examination.  I recommend you have a repeat colonoscopy examination in _5 years to look for recurrent polyps, as having colon polyps increases your risk for having recurrent polyps or even colon cancer in the future.  Should you develop new or worsening symptoms of abdominal pain, bowel habit changes or bleeding from the rectum or bowels, please schedule an evaluation with either your primary care physician or with me.  Additional information/recommendations:  __ No further action with gastroenterology is needed at this time. Please      follow-up with your primary care physician for your other healthcare      needs.  __ Please call [phone] to schedule a return visit to review your      situation.  __ Please keep your follow-up visit as already scheduled.  _x_ Continue treatment plan as outlined the day of your exam.  Please call us if you are having persistent problems or have questions about your condition that have not been fully answered at this time.  Sincerely,  Louis Meckel MD  This letter has been electronically signed by your physician.  Appended Document: Patient Notice- Polyp Results LETTER MAILED

## 2010-07-02 NOTE — Letter (Signed)
Summary: [facility]   Imported By: Lennie Odor 05/18/2010 11:52:07  _____________________________________________________________________  External Attachment:    Type:   Image     Comment:   External Document

## 2010-07-02 NOTE — Procedures (Addendum)
Summary: Colonoscopy  Patient: Kristy [name] Note: All result statuses are Final unless otherwise noted.  Tests: (1) Colonoscopy (COL)   COL Colonoscopy           DONE     [facility]     [address]     [place], [place]  [address]           COLONOSCOPY PROCEDURE REPORT           PATIENT:  [name], [name]  MR#:  [id number]     BIRTHDATE:  [birth year], 64 yrs. old  GENDER:  female           ENDOSCOPIST:  Barbette Hair. Arlyce Dice, MD     Referred by:           PROCEDURE DATE:  06/16/2010     PROCEDURE:  Colonoscopy with snare polypectomy     ASA CLASS:  Class II     INDICATIONS:  1) screening  2) history of pre-cancerous     (adenomatous) colon polyps Index polypectomy 1999; 2004     colonoscopy 2004 normal           MEDICATIONS:   Fentanyl 100 mcg IV, Versed 9 mg IV, Benadryl 12.5     mg IV           DESCRIPTION OF PROCEDURE:   After the risks benefits and     alternatives of the procedure were thoroughly explained, informed     consent was obtained.  Digital rectal exam was performed and     revealed no abnormalities.   The LB 180AL [id number] endoscope was     introduced through the anus and advanced to the cecum, which was     identified by the ileocecal valve, without limitations.  The     quality of the prep was Moviprep fair.  The instrument was then     slowly withdrawn as the colon was fully examined.     <<PROCEDUREIMAGES>>           FINDINGS:  Two polyps were found in the descending colon. 2 3mm     sessile polyps Polyp was snared without cautery. Retrieval was     successful (see image1 and image11). snare polyp  Internal     hemorrhoids were found (see image14).  This was otherwise a normal     examination of the colon (see image3, image4, image5, image6,     image8, image9, and image13).   Retroflexed views in the rectum     revealed no abnormalities.    The time to cecum =  10.25  minutes.     The scope was then withdrawn (time =  10.75  min) from the patient  and the procedure completed.           COMPLICATIONS:  None           ENDOSCOPIC IMPRESSION:     1) Two polyps in the descending colon     2) Internal hemorrhoids     3) Otherwise normal examination     RECOMMENDATIONS:     1) If the polyp(s) removed today are proven to be adenomatous     (pre-cancerous) polyps, you will need a repeat colonoscopy in 5     years. Otherwise you should continue to follow colorectal cancer     screening guidelines for "routine risk" patients with colonoscopy     in 10 years.  REPEAT EXAM:   You will receive a letter from Dr. Arlyce Dice in 1-2     weeks, after reviewing the final pathology, with followup     recommendations.           ______________________________     Barbette Hair Arlyce Dice, MD           CC: Jacques Navy, MD           n.     Rosalie DoctorBarbette Hair. Jeff Mccallum at 06/16/2010 08:48 AM           Page 2 of 3   Cleaster, Shiffer [place], [phone]  Note: An exclamation mark (!) indicates a result that was not dispersed into the flowsheet. Document Creation Date: 06/16/2010 8:49 AM _______________________________________________________________________  (1) Order result status: Final Collection or observation date-time: 06/16/2010 08:39 Requested date-time:  Receipt date-time:  Reported date-time:  Referring Physician:   Ordering Physician: Melvia Heaps [id number]) Specimen Source:  Source: Launa Grill Order Number: [id number] Lab site:   Appended Document: Colonoscopy     Procedures Next Due Date:    Colonoscopy: 06/2015

## 2010-07-02 NOTE — Letter (Signed)
Summary: [facility] Instructions  [facility]  [address] [place], [place] [address]   Phone: [phone]  Fax: [phone]       Kristy [name]    05-06-1946    MRN: [id number]        Procedure Day /Date:  Tuesday 06/16/2010     Arrival Time: 7:30 am     Procedure Time: 8:30 am     Location of Procedure:                    _x _  [facility] (4th Floor)                        PREPARATION FOR COLONOSCOPY WITH MOVIPREP   Starting 5 days prior to your procedure Thursday 1/12 do not eat nuts, seeds, popcorn, corn, beans, peas,  salads, or any raw vegetables.  Do not take any fiber supplements (e.g. Metamucil, Citrucel, and Benefiber).  THE DAY BEFORE YOUR PROCEDURE         DATE: Monday 1/16  1.  Drink clear liquids the entire day-NO SOLID FOOD  2.  Do not drink anything colored red or purple.  Avoid juices with pulp.  No orange juice.  3.  Drink at least 64 oz. (8 glasses) of fluid/clear liquids during the day to prevent dehydration and help the prep work efficiently.  CLEAR LIQUIDS INCLUDE: Water Jello Ice Popsicles Tea (sugar ok, no milk/cream) Powdered fruit flavored drinks Coffee (sugar ok, no milk/cream) Gatorade Juice: apple, white grape, white cranberry  Lemonade Clear bullion, consomm, broth Carbonated beverages (any kind) Strained chicken noodle soup Hard Candy                             4.  In the morning, mix first dose of MoviPrep solution:    Empty 1 Pouch A and 1 Pouch B into the disposable container    Add lukewarm drinking water to the top line of the container. Mix to dissolve    Refrigerate (mixed solution should be used within 24 hrs)  5.  Begin drinking the prep at 5:00 p.m. The MoviPrep container is divided by 4 marks.   Every 15 minutes drink the solution down to the next mark (approximately 8 oz) until the full liter is complete.   6.  Follow completed prep with 16 oz of clear liquid of your choice (Nothing red  or purple).  Continue to drink clear liquids until bedtime.  7.  Before going to bed, mix second dose of MoviPrep solution:    Empty 1 Pouch A and 1 Pouch B into the disposable container    Add lukewarm drinking water to the top line of the container. Mix to dissolve    Refrigerate  THE DAY OF YOUR PROCEDURE      DATE: Tuesday 1/17  Beginning at 3:30 a.m. (5 hours before procedure):         1. Every 15 minutes, drink the solution down to the next mark (approx 8 oz) until the full liter is complete.  2. Follow completed prep with 16 oz. of clear liquid of your choice.    3. You may drink clear liquids until 6:30 am (2 HOURS BEFORE PROCEDURE).   MEDICATION INSTRUCTIONS  Unless otherwise instructed, you should take regular prescription medications with a small sip of water   as early as possible the morning of your  procedure.).  Additional medication instructions: Do not take Furosemide am of procedure.         OTHER INSTRUCTIONS  You will need a responsible adult at least 66 years of age to accompany you and drive you home.   This person must remain in the waiting room during your procedure.  Wear loose fitting clothing that is easily removed.  Leave jewelry and other valuables at home.  However, you may wish to bring a book to read or  an iPod/MP3 player to listen to music as you wait for your procedure to start.  Remove all body piercing jewelry and leave at home.  Total time from sign-in until discharge is approximately 2-3 hours.  You should go home directly after your procedure and rest.  You can resume normal activities the  day after your procedure.  The day of your procedure you should not:   Drive   Make legal decisions   Operate machinery   Drink alcohol   Return to work  You will receive specific instructions about eating, activities and medications before you leave.    The above instructions have been reviewed and explained to me by   Ulis Rias RN  June 02, 2010 2:22 PM     I fully understand and can verbalize these instructions _____________________________ Date _________

## 2010-07-07 ENCOUNTER — Encounter: Payer: Self-pay | Admitting: Internal Medicine

## 2010-07-22 NOTE — Letter (Signed)
Summary: [facility]  [facility]   Imported By: Sherian Rein 07/15/2010 07:32:50  _____________________________________________________________________  External Attachment:    Type:   Image     Comment:   External Document

## 2010-08-27 ENCOUNTER — Other Ambulatory Visit: Payer: Self-pay | Admitting: Internal Medicine

## 2010-09-30 ENCOUNTER — Other Ambulatory Visit: Payer: Self-pay | Admitting: Internal Medicine

## 2010-10-13 NOTE — Assessment & Plan Note (Signed)
[facility]                           PRIMARY CARE OFFICE NOTE   [name], [name]                        MRN:          [id number]  DATE:12/28/2006                            DOB:          Oct 28, 1945    Kristy [name] is a 65 year old woman who presents today for evaluation.  She  was last seen in the office March 03, 2006 for suture removal after  sustaining a laceration in a fall at home.   INTERVAL HISTORY:  1. Stereotactic breast biopsy on the right for increased      calcifications.  Final result was benign.  2. DEXA scan June 2008, which was normal.  3. Triple arthrodesis with 4 screws placed in her left ankle by Aldean Baker October 2007 after a fracture.  4. Rheumatology.  Patient has been seen and followed by Dr. Estill Bakes      with no changes in her medical regimen.   PAST MEDICAL HISTORY:  Well documented in chart noted December 02, 2005.   PHYSICIAN ROSTER:  1. Rheumatology:  Dr. Phylliss Bob.  2. Allergy:  Dr. Stevphen Rochester.  3. Gynecology:  Dr. Eda Paschal.  4. GI:  Dr. Victorino Dike, now retired.  5. Podiatry:  Dr. Leeanne Deed.  6. Orthopedics:  Dr. Wyonia Hough.  7. Dermatology:  Dr. Leta Speller.   CURRENT MEDICATIONS:  1. AeroBid 2 puffs b.i.d.  2. Nortriptyline 50 mg nightly.  3. Diltiazem ER 240 mg daily.  4. Robinul forte 2 mg b.i.d. p.r.n.  5. Astelin nasal spray b.i.d.  6. Nasacort spray daily.  7. Lasix 40 mg daily.  8. Premarin vaginal cream 3 times a week.  9. Allegra 180 mg daily.  10.Patanol eye drops daily.  11.Nabumetone 1500 mg nightly.  12.Nexium 40 mg q.a.m.  13.Ranitidine 150 mg q.p.m.  14.Albuterol as ProAir meter dose inhaler q.i.d. p.r.n.  15.Epi pen as needed.   FAMILY HISTORY:  Documented in previous chart notes, and are unchanged.   SOCIAL HISTORY:  Documented in previous chart notes, and are unchanged.   HEALTH MAINTENANCE:  1. Last mammogram May 2008 that was normal.  2. Pap smear June 2008 that was normal.  3. Last colonoscopy was May 2004 with followup in 5 years, that would      be 2009.   REVIEW OF SYSTEMS:  Patient has had insomnia, but this is a longstanding  problem.  Patient's last eye exam March 2008 that was normal, and she  has had no visual problems.  No ENT, cardiovascular, respiratory  complaints, except for dyspnea on exertion, which is stable.  Patient  has occasional reflux and heartburn, and will add Zantac at night for  this.  No GU complaints.  No dermatologic or neurologic complaints.   EXAMINATION:  Temperature was 98.  Blood pressure 151/78.  Pulse was  105.  Weight 209.  GENERAL APPEARANCE:  This is a heavyset woman in no acute distress.  HEENT EXAM:  Normocephalic and atraumatic.  EACs and TMs were normal.  Oropharynx with native dentition in good repair.  No buccal or  palate  lesions were noted.  Posterior pharynx was clear.  Conjunctivae and  sclerae were clear.  PERRLA.  EOMI.  Funduscopic exam deferred to  Ophthalmology.  NECK:  Supple without thyromegaly.  No lymphadenopathy noticed in the cervical or supraclavicular regions.  CHEST:  No CVA tenderness.  No deformities noted.  LUNGS:  Clear with no rales, wheezes, or rhonchi.  BREAST EXAM:  Deferred to Gynecology.  CARDIOVASCULAR:  Two plus radial pulses.  No JVD.  No carotid bruits.  She had a quiet precordium.  She had a regular rate and rhythm without  murmurs, rubs, or gallops.  ABDOMEN:  Soft.  No guarding.  No rebound.  No organosplenomegaly was  appreciated.  PELVIC AND RECTAL:  Exams deferred to Gynecology.  EXTREMITIES:  Patient has scarring of her left ankle from surgery, and  she has loss of inversion and eversion, but has dorsal flexion and  plantar flexion preserved.  No other deformities are noted.  SKIN:  Was clear.   LABORATORY:  Hemoglobin was 15.9 gm, white count 8.2 with a normal  differential.  Chemistries were normal.  Blood sugar was elevated at  126.  Kidney function normal with a  creatinine of 0.8, GFR of 77 mL per  minute.  Liver functions remain mildly elevated with an SGOT of 41, SGPT  of 56, otherwise labs were normal.  Cholesterol was 179, triglycerides  176, HDL 26.2, LDL was 118.  Thyroid function normal with a TSH of 2.84.  Urinalysis was negative.   ASSESSMENT AND PLAN:  1. Hypertension.  Patient poorly controlled at this time with mild      elevation as noted.  We will have her continue on her present      regimen.  I have asked her to monitor her blood pressure at home.      Her last reading in the office was normal at 134/86.  In July 2007      blood pressure was 130/84.  If home monitoring reveals consistently      elevated systolic over 140, and we need to adjust the patient's      medical regimen.  2. Fibromyalgia.  Stable on her present medical regimen.  3. Allergic rhinitis.  Stable.  She will continue with Allegra.  4. Asthma.  Patient is well controlled with AeroBid and as-needed      albuterol inhaler.  5. Orthopedics.  Patient continues to follow with Dr. Lajoyce Corners.  She is      doing relatively well at this time.  She does well with nabumetone.      A new prescription was provided for her.  6. Dermatology.  Rosacea is well controlled.  She is followed by Dr.      Leta Speller on a regular basis.  7. Health maintenance.  Patient is current and up to date with      Gynecology and colorectal screening.   SUMMARY:  This is a very pleasant woman who seems medically stable at  this time.  I would like her follow a no-sugar, low-concentrated sweets  diet in order to keep her serum glucose under control.  Patient also is  to follow a low-fat diet.  I believe that her transaminase elevation is  secondary to fatty infiltrate of the liver as documented on ultrasound  of the abdomen with increased echodensity consistent with steatoses from  a study of 2001.   Patient is asked to return to see me on a p.r.n. basis.  Rosalyn Gess Norins, MD   Electronically Signed    MEN/MedQ  DD: 12/29/2006  DT: 12/29/2006  Job #: [id number]   cc:   Areatha Keas, M.D.  Daniel L. Eda Paschal, M.D.

## 2010-10-16 NOTE — Op Note (Signed)
NAMESHANZAY, Kristy [name]                 ACCOUNT NO.:  [id number]   MEDICAL RECORD NO.:  [id number]          PATIENT TYPE:  INP   LOCATION:  5013                         FACILITY:  [facility]   PHYSICIAN:  Nadara Mustard, MD     DATE OF BIRTH:  Oct 02, 1945   DATE OF PROCEDURE:  03/17/2006  DATE OF DISCHARGE:                                 OPERATIVE REPORT   PREOPERATIVE DIAGNOSIS:  Left foot posterior tibial tendon insufficiency  with subtalar arthrosis with fixed pronated valgus hindfoot.   POSTOPERATIVE DIAGNOSIS:  Left foot posterior tibial tendon insufficiency  with subtalar arthrosis with fixed pronated valgus hindfoot.   PROCEDURE:  Left foot triple arthrodesis.   SURGEON.:  Nadara Mustard, MD   ANESTHESIA:  General plus popliteal block.   ESTIMATED BLOOD LOSS:  Minimal.   ANTIBIOTICS:  Clindamycin 600 mg IV.   DRAINS:  None.   COMPLICATIONS:  None.   TOURNIQUET TIME:  Esmarch at the ankle for approximately 69 minutes.   DISPOSITION:  To PACU in stable condition.   INDICATIONS FOR PROCEDURE:  The patient is a 65 year old woman with  posterior tibial tendon insufficiency.  She has developed a fixed pronated  valgus hindfoot with subtalar arthrosis, has failed conservative care and  presents at this time for surgical intervention.  Risks and benefits were  discussed including infection, neurovascular injury, persistent pain,  failure of union, need for additional surgery.  The patient states she  understands and wishes to proceed at this time.   DESCRIPTION OF PROCEDURE:  The patient was brought to OR room 1 and  underwent a general anesthetic after a popliteal block.  After adequate  levels of anesthesia were obtained, the patient was prepped using Hibiclens  and draped into a sterile field.  Of note, the area of the popliteal block  did use Betadine and this was cleansed prior to prepping with the Hibiclens.  The foot was draped into a sterile field with a stockinette.  The  leg was  elevated and Esmarch was wrapped around the ankle for tourniquet control.  An oblique incision was made in the line of the skin folds over the sinus  tarsi.  This was carried down the extensor tendons and extensor muscles were  elevated and retracted distally.  The subtalar and calcaneocuboid joint were  identified and the articular cartilage was removed from both joints.  The  articular cartilage removed as well as all soft tissue from the subtalar  joint.  This was irrigated with normal saline.  Attention was then focused  to the talonavicular joint.  Incision was made dorsally over the  talonavicular joint.  The talonavicular joint was identified and the  articular cartilage was removed from this joint.  There were also degenerate  changes of the navicular medial cuneiform and this was also debrided of  articular cartilage.  The joints were reduced, irrigated with normal saline.  A guidewire was inserted with the foot held plantigrade with about 10  degrees of valgus and neutral ankle dorsiflexion and a guidewire was  inserted from the talar neck  into the calcaneus.  This measured 60 mm and a  60 mm partially threaded 7.3 screw was inserted.  With the foot plantigrade  and neutral ankle dorsiflexion, a screw was inserted across the  calcaneocuboid joint and then 2 screws were used to insert across the  talonavicular and the medial cuneiform to the navicular into the talus.  C-  arm fluoroscopy verified reduction in AP and lateral planes.  The foot was  stable.  Demineralized bone matrix and croutons were used of 5 mL to pack  the subtalar joint.  The Esmarch was released and hemostasis was obtained.  Subcu was closed using 2-0 Vicryl.  The skin was closed using 2-0 nylon with  a modified vertical mattress suture.  The wounds were covered with Adaptic  orthopedic sponges, Webril and a Coban dressing.  The patient was extubated  and taken to PACU in stable condition.       Nadara Mustard, MD  Electronically Signed     MVD/MEDQ  D:  03/17/2006  T:  03/18/2006  Job:  [id number]

## 2010-10-16 NOTE — Op Note (Signed)
[place]. [facility]  Patient:    Kristy [name], Kristy [name]                        MRN: [id number] Proc. Date: 02/11/00 Adm. Date:  [id number] Attending:  Glenna Fellows T[place]                           Operative Report  PREOPERATIVE DIAGNOSIS:  Cholelithiasis, cholecystitis.  POSTOPERATIVE DIAGNOSIS:  Cholelithiasis, cholecystitis.  OPERATION PERFORMED:  Laparoscopic cholecystectomy.  SURGEON:  Lorne Skeens. Hoxworth, M.D.  ASSISTANT:  Velora Heckler, M.D.  ANESTHESIA:  General.  INDICATIONS FOR PROCEDURE:  Kristy [name] is a 65 year old white female with recent onset of episodic severe epigastric abdominal pain.  Work-up has included a gallbladder ultrasound showing multiple gallstones.  She is felt to have symptomatic cholelithiasis.  Laparoscopic cholecystectomy has been recommended and accepted.  The nature of the procedure, its indications and risks of bleeding, infection, bile leak and bile duct injury were discussed and understood.  The patient is now brought to the operating room for this procedure.  DESCRIPTION OF PROCEDURE:  The patient was brought to the operating room, placed in supine position on the operating room and general endotracheal anesthesia was induced.  The abdomen was sterilely prepped and draped.  PAS were in place.  Antibiotics had been given preoperatively.  Local anesthesia was used to infiltrate the trocar sites.  A 1 cm incision was made in the umbilicus and dissection carried down to the midline fascia.  This was sharply incised for 1 cm and the perineum entered under direct vision.  Through a mattress suture of 0 Vicryl, the Hasson trocar was placed and pneumoperitoneum established.  Under direct vision, a 10 mm trocar was placed in the subxiphoid area and two 5 mm trocars on the right subcostal margin.  The gallbladder was visualized.  The fundus was grasped.  There were omental adhesions were carefully stripped down off the  gallbladder.  The infundibulum retracted inferolaterally.  Fibrofatty tissue was stripped down off the neck of the gallbladder toward the porta hepatis.  The distal gallbladder was dissected and the cystic duct identified and dissected free over about a centimeter and a half.  The cystic duct gallbladder junction was dissected 360 degrees.  The cystic duct was clipped at the gallbladder junction.  A small nick was made in the cystic duct for a cholangiogram but the catheter was unable to be advanced due to a valve.  The patients liver function tests were normal and I elected to not attempt any further efforts at cholangiography.  The cystic duct was doubly clipped proximally and divided.  Calots triangle has been thoroughly dissected and the anterior and posterior branches of the cystic artery were individually divided between clips.  The gallbladder was then dissected free from its bed using the hook and spatula and removed intact through the umbilicus.  The operative site was inspected for hemostasis which was complete.  All CO2 was evacuated from the peritoneal cavity.  The pursestring suture was secured to the umbilicus.  The skin incisions were closed with interrupted subcuticular 4-0 Monocryl and Steri-Strips.  The sponge and needle counts were correct.  Dry sterile dressings were applied and the patient was taken to recovery in good condition. DD:  02/11/00 TD:  02/12/00 Job: [id number]

## 2010-10-29 ENCOUNTER — Other Ambulatory Visit: Payer: Self-pay | Admitting: Internal Medicine

## 2011-02-11 ENCOUNTER — Other Ambulatory Visit: Payer: Self-pay | Admitting: Obstetrics and Gynecology

## 2011-02-11 DIAGNOSIS — Z1231 Encounter for screening mammogram for malignant neoplasm of breast: Secondary | ICD-10-CM

## 2011-03-23 DIAGNOSIS — D219 Benign neoplasm of connective and other soft tissue, unspecified: Secondary | ICD-10-CM | POA: Insufficient documentation

## 2011-03-23 DIAGNOSIS — N393 Stress incontinence (female) (male): Secondary | ICD-10-CM | POA: Insufficient documentation

## 2011-03-23 DIAGNOSIS — N938 Other specified abnormal uterine and vaginal bleeding: Secondary | ICD-10-CM | POA: Insufficient documentation

## 2011-03-23 DIAGNOSIS — N952 Postmenopausal atrophic vaginitis: Secondary | ICD-10-CM | POA: Insufficient documentation

## 2011-03-25 ENCOUNTER — Ambulatory Visit
Admission: RE | Admit: 2011-03-25 | Discharge: 2011-03-25 | Disposition: A | Discharge status: Home / Self Care | Payer: Federal, State, Local not specified - PPO | Source: Ambulatory Visit | Attending: Obstetrics and Gynecology | Admitting: Obstetrics and Gynecology

## 2011-03-25 DIAGNOSIS — Z1231 Encounter for screening mammogram for malignant neoplasm of breast: Secondary | ICD-10-CM

## 2011-03-30 ENCOUNTER — Other Ambulatory Visit: Payer: Self-pay | Admitting: Internal Medicine

## 2011-03-31 ENCOUNTER — Encounter: Payer: Self-pay | Admitting: Obstetrics and Gynecology

## 2011-03-31 ENCOUNTER — Ambulatory Visit (INDEPENDENT_AMBULATORY_CARE_PROVIDER_SITE_OTHER): Payer: Medicare Other | Admitting: Obstetrics and Gynecology

## 2011-03-31 VITALS — BP 112/70 | Ht 64.0 in | Wt 210.0 lb

## 2011-03-31 DIAGNOSIS — N952 Postmenopausal atrophic vaginitis: Secondary | ICD-10-CM

## 2011-03-31 DIAGNOSIS — B373 Candidiasis of vulva and vagina: Secondary | ICD-10-CM

## 2011-03-31 DIAGNOSIS — R351 Nocturia: Secondary | ICD-10-CM

## 2011-03-31 DIAGNOSIS — R3915 Urgency of urination: Secondary | ICD-10-CM

## 2011-03-31 DIAGNOSIS — B3731 Acute candidiasis of vulva and vagina: Secondary | ICD-10-CM

## 2011-03-31 MED ORDER — ESTROGENS, CONJUGATED 0.625 MG/GM VA CREA: TOPICAL_CREAM | VAGINAL | Status: DC

## 2011-03-31 NOTE — Progress Notes (Signed)
Subjective:     Patient ID: Kristy [name], female   DOB: 1945/10/01, 65 y.o.   MRN: [id number]  HPIpatient came to see me today for further followup. We are treating her for atrophic vaginitis with Premarin cream. She says she does well with that although doesn't always remember to use it. She is having no vaginal bleeding or pelvic pain. She does have urinary frequency, urgency and incontinence but does not desire medical intervention at the moment. She is up-to-date on her mammograms. She had a normal bone density. She has a sister and a cousin with early onset breast cancer and they were gene positive. They suggested to her that she should be tested but so far she has not done this. She has however had her ovaries removed. She does have recurrent yeast infections but responds well when we treat her.  Review of Systems  Constitutional: Positive for fatigue.  HENT:       Hearing loss  Eyes: Negative.   Respiratory:       Asthma  Cardiovascular:       Chest pain, hypertension,  Gastrointestinal: Positive for abdominal pain.       Hiatal hernia  Genitourinary:       See history of present illness  Musculoskeletal:       Arthritis, fibromyalgia,  Skin: Negative.   Neurological:       Degenerative disc disease  Hematological: Negative.   Psychiatric/Behavioral: Negative.        Objective:   Physical ExamHEENT: Within normal limits. Kennon Portela present Neck: No masses. Supraclavicular lymph nodes: Not enlarged. Breasts: Examined in both sitting and lying position. Symmetrical without skin changes or masses. Abdomen: Soft no masses guarding or rebound. No hernias. Pelvic: External within normal limits. BUS within normal limits. Vaginal examination shows good estrogen effect, no cystocele enterocele or rectocele. Cervix and uterus absent. Adnexa within normal limits. Rectovaginal confirmatory. Extremities within normal limits.      Assessment:     #1. Atrophic vaginitis #2.  Recurrent yeast infections #3. Detrussor instability #4. Family history of positive BRCA one or 2.    Plan:     Discussed all the above issues. Refilled her Premarin cream. Urged her to be tested for BRCA1 and 2. Explained the issues how it might effect her sons

## 2011-04-30 ENCOUNTER — Encounter: Payer: Self-pay | Admitting: Internal Medicine

## 2011-05-03 ENCOUNTER — Other Ambulatory Visit: Payer: Self-pay | Admitting: Internal Medicine

## 2011-05-05 ENCOUNTER — Encounter: Payer: Self-pay | Admitting: Internal Medicine

## 2011-05-05 ENCOUNTER — Other Ambulatory Visit (INDEPENDENT_AMBULATORY_CARE_PROVIDER_SITE_OTHER): Payer: Medicare Other

## 2011-05-05 ENCOUNTER — Ambulatory Visit (INDEPENDENT_AMBULATORY_CARE_PROVIDER_SITE_OTHER): Payer: Medicare Other | Admitting: Internal Medicine

## 2011-05-05 VITALS — BP 128/74 | HR 97 | Temp 97.5°F | Ht 65.0 in | Wt 211.0 lb

## 2011-05-05 DIAGNOSIS — K219 Gastro-esophageal reflux disease without esophagitis: Secondary | ICD-10-CM

## 2011-05-05 DIAGNOSIS — IMO0001 Reserved for inherently not codable concepts without codable children: Secondary | ICD-10-CM

## 2011-05-05 DIAGNOSIS — R7309 Other abnormal glucose: Secondary | ICD-10-CM

## 2011-05-05 DIAGNOSIS — N949 Unspecified condition associated with female genital organs and menstrual cycle: Secondary | ICD-10-CM

## 2011-05-05 DIAGNOSIS — J45909 Unspecified asthma, uncomplicated: Secondary | ICD-10-CM

## 2011-05-05 DIAGNOSIS — Z0000 Encounter for general adult medical examination without abnormal findings: Secondary | ICD-10-CM

## 2011-05-05 DIAGNOSIS — N925 Other specified irregular menstruation: Secondary | ICD-10-CM

## 2011-05-05 DIAGNOSIS — R739 Hyperglycemia, unspecified: Secondary | ICD-10-CM

## 2011-05-05 DIAGNOSIS — M109 Gout, unspecified: Secondary | ICD-10-CM

## 2011-05-05 DIAGNOSIS — I10 Essential (primary) hypertension: Secondary | ICD-10-CM

## 2011-05-05 DIAGNOSIS — N938 Other specified abnormal uterine and vaginal bleeding: Secondary | ICD-10-CM

## 2011-05-05 DIAGNOSIS — Z23 Encounter for immunization: Secondary | ICD-10-CM

## 2011-05-05 LAB — CBC WITH DIFFERENTIAL/PLATELET
Basophils Absolute: 0 10*3/uL (ref 0.0–0.1)
Eosinophils Absolute: 0.2 10*3/uL (ref 0.0–0.7)
HCT: 44.1 % (ref 36.0–46.0)
Lymphs Abs: 3.7 10*3/uL (ref 0.7–4.0)
MCHC: 34.1 g/dL (ref 30.0–36.0)
Monocytes Absolute: 0.9 10*3/uL (ref 0.1–1.0)
Monocytes Relative: 9.3 % (ref 3.0–12.0)
Neutro Abs: 5 10*3/uL (ref 1.4–7.7)
Platelets: 274 10*3/uL (ref 150.0–400.0)
RDW: 13.1 % (ref 11.5–14.6)

## 2011-05-05 LAB — COMPREHENSIVE METABOLIC PANEL
ALT: 32 U/L (ref 0–35)
AST: 21 U/L (ref 0–37)
CO2: 28 mEq/L (ref 19–32)
Chloride: 103 mEq/L (ref 96–112)
GFR: 90.35 mL/min (ref >60.00)
Sodium: 141 mEq/L (ref 135–145)
Total Bilirubin: 1.1 mg/dL (ref 0.3–1.2)
Total Protein: 7.9 g/dL (ref 6.0–8.3)

## 2011-05-05 LAB — HEMOGLOBIN A1C: Hgb A1c MFr Bld: 6.4 % (ref 4.6–6.5)

## 2011-05-05 MED ORDER — TETANUS-DIPHTH-ACELL PERTUSSIS 5-2.5-18.5 LF-MCG/0.5 IM SUSP: 0.5000 mL | Freq: Once | INTRAMUSCULAR | Status: DC

## 2011-05-05 NOTE — Progress Notes (Signed)
Subjective:    Patient ID: Kristy [name], female    DOB: 1946-02-25, 65 y.o.   MRN: [id number]  HPI The patient is here for annual Medicare wellness examination and management of other chronic and acute problems. No new problems. Dizziness and acid reflux are the main problems.   The risk factors are reflected in the social history.  The roster of all physicians providing medical care to patient - is listed in the Snapshot section of the chart.  Activities of daily living:  The patient is 100% inedpendent in all ADLs: dressing, toileting, feeding as well as independent mobility  Home safety : The patient has smoke detectors in the home. They wear seatbelts.  Firearms are present in the home, kept in a safe fashion. There is no violence in the home.   There is no risks for hepatitis, STDs or HIV. There is no history of blood transfusion. They have no travel history to infectious disease endemic areas of the world.  The patient has seen their dentist in the last six month. They have seen their eye doctor in the last year. They admit to any hearing difficulty and have had audiologic testing in the last year.  They do not  have excessive sun exposure. Discussed the need for sun protection: hats, long sleeves and use of sunscreen if there is significant sun exposure.   Diet: the importance of a healthy diet is discussed. They do have a healthy diet.  The patient has a regular exercise program in summer: swimmoing , 30 duration, 3 per week.  The benefits of regular aerobic exercise were discussed.  Depression screen: there are vegative symptoms of depression- irritability, change in appetite, anhedonia, sadness/tearfullness.  Cognitive assessment: the patient manages all their financial and personal affairs and is actively engaged. They could relate day,date,year and events; recalled 3/3 objects at 3 minutes; performed clock-face test normally.  The following portions of the patient's history  were reviewed and updated as appropriate: allergies, current medications, past family history, past medical history,  past surgical history, past social history  and problem list.  Vision, hearing, body mass index were assessed and reviewed.   During the course of the visit the patient was educated and counseled about appropriate screening and preventive services including : fall prevention , diabetes screening, nutrition counseling, colorectal cancer screening, and recommended immunizations.  Past Medical History  Diagnosis Date  . Asthma   . Arthritis   . Hypertension   . Hiatal hernia   . DUB (dysfunctional uterine bleeding)   . SUI (stress urinary incontinence, female)   . Atrophic vaginitis   . Fibroid   . Fibromyalgia   . Reflux    Past Surgical History  Procedure Date  . Abdominal hysterectomy 1994    TAH,BSO-BLADDER NECK SUSPENSION  . Surgery for poserior tibial tendon 2003  . Cystocele repair 1994  . Cholecystectomy, laparoscopic 2001  . Tubal ligation   . Carpal tunnel release 2005  . Triple fusion left ankle 2007  . Oophorectomy     BSO  . Breast surgery     LUMPECTOMY-BENIGN Breast Bx X2   Family History  Problem Relation Age of Onset  . Breast cancer Mother   . Breast cancer Sister   . Hypertension Sister   . Hemochromatosis Sister   . Cancer Father     Brain tumor   History   Social History  . Marital Status: Married    Spouse Name: N/A    Number  of Children: 1  . 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 Smoking status: Never Smoker   . Smokeless tobacco: Never Used  . Alcohol Use: Yes     rare  . Drug Use: No  . Sexually Active: No   Other Topics Concern  . Not on file   Social History Narrative   married '69. 2 sons - '70, '72: retired - [employer]. SO- good health      Review of Systems Constitutional:  Negative for fever, chills, activity change and unexpected weight change.    HEENT:  Negative for hearing loss, ear pain, congestion, neck stiffness and postnasal drip. Negative for sore throat or swallowing problems. Negative for dental complaints.   Eyes: Negative for vision loss or change in visual acuity.  Respiratory: Negative for chest tightness and wheezing. Negative for DOE.   Cardiovascular: Negative for chest pain or palpitations. No decreased exercise tolerance Gastrointestinal: No change in bowel habit. No bloating or gas.Postive for reflux, no indigestion Genitourinary: Negative for urgency, frequency, flank pain and difficulty urinating.  Musculoskeletal: Negative for myalgias, back pain, arthralgias and gait problem.  Neurological: Negative for dizziness, tremors, weakness and headaches.  Hematological: Negative for adenopathy.  Psychiatric/Behavioral: Negative for behavioral problems and dysphoric mood.       Objective:   Physical Exam Vitals reviewed- wt 207, BMI 35.11. Gen'l: well nourished, well developed, overweight white woman in no distress HEENT - [place]/AT, EACs/TMs normal, oropharynx with native dentition in good condition, no buccal or palatal lesions, posterior pharynx clear, mucous membranes moist. C&S clear, PERRLA, fundi - normal Neck - supple, no thyromegaly Nodes- negative submental, cervical, supraclavicular regions Chest - no deformity, no CVAT Lungs - cleat without rales, wheezes. No increased work of breathing Breast - deferred to gyn Cardiovascular - regular rate and rhythm, quiet precordium, no murmurs, rubs or gallops, 2+ radial, DP and PT pulses Abdomen - BS+ x 4, no HSM, no guarding or rebound or tenderness Pelvic - deferred to gyn Rectal - deferred to gyn Extremities - no clubbing, cyanosis, edema or deformity.  Neuro - A&O x 3, CN II-XII normal, motor strength normal and equal, DTRs 2+ and symmetrical biceps, radial, and patellar tendons. Cerebellar - no tremor, no rigidity, fluid movement and normal gait. Derm - Head,  neck, back, abdomen and extremities without suspicious lesions  Lab Results  Component Value Date   WBC 9.9 05/05/2011   HGB 15.0 05/05/2011   HCT 44.1 05/05/2011   PLT 274.0 05/05/2011   GLUCOSE 92 05/05/2011   CHOL 164 04/28/2010   TRIG 108.0 04/28/2010   HDL 38.40* 04/28/2010   LDLCALC 104* 04/28/2010   ALT 32 05/05/2011   AST 21 05/05/2011   NA 141 05/05/2011   K 3.9 05/05/2011   CL 103 05/05/2011   CREATININE 0.7 05/05/2011   BUN 13 05/05/2011   CO2 28 05/05/2011   TSH 1.32 02/24/2009   HGBA1C 6.4 05/05/2011           Assessment & Plan:

## 2011-05-06 DIAGNOSIS — Z0000 Encounter for general adult medical examination without abnormal findings: Secondary | ICD-10-CM | POA: Insufficient documentation

## 2011-05-06 NOTE — Assessment & Plan Note (Addendum)
Doing well w/ no recent flares off bronchodilators, albuterol and dulera. She does use astelin nasal spray for allergy

## 2011-05-06 NOTE — Assessment & Plan Note (Signed)
Doing well with controlled symptoms.

## 2011-05-06 NOTE — Assessment & Plan Note (Signed)
BP Readings from Last 3 Encounters:  05/05/11 128/74  03/31/11 112/70  04/28/10 112/84   Good control. Will continue present regimen

## 2011-05-06 NOTE — Assessment & Plan Note (Signed)
Stable - able to do all of her desired activities.

## 2011-05-06 NOTE — Assessment & Plan Note (Signed)
Interval medical history is unremarkable: no serious illness, surgery or injury. Physical exam, sans breast and pelvic, is notable only for obesity. Lab results are in normal range and fine. She is current for colorectal and breast cancer screening. Immunizations are up to date except she is due for shingles vaccine.  In summary - a very nice woman who is medically stable. She is encouraged to continue an exercise program; weight management through smart food choices, PORTION SIZE CONTROL. Goal is to loose 1-2 lbs/month with a target weight of 160. ( a three year project). She will return as needed or in 1 year.

## 2011-05-10 ENCOUNTER — Encounter: Payer: Self-pay | Admitting: Internal Medicine

## 2011-05-27 ENCOUNTER — Other Ambulatory Visit: Payer: Self-pay | Admitting: *Deleted

## 2011-05-27 MED ORDER — FUROSEMIDE 40 MG PO TABS: 40.0000 mg | ORAL_TABLET | Freq: Every day | ORAL | Status: DC

## 2011-06-14 DIAGNOSIS — IMO0001 Reserved for inherently not codable concepts without codable children: Secondary | ICD-10-CM | POA: Diagnosis not present

## 2011-06-14 DIAGNOSIS — M159 Polyosteoarthritis, unspecified: Secondary | ICD-10-CM | POA: Diagnosis not present

## 2011-06-14 DIAGNOSIS — J309 Allergic rhinitis, unspecified: Secondary | ICD-10-CM | POA: Diagnosis not present

## 2011-06-14 DIAGNOSIS — M1A00X0 Idiopathic chronic gout, unspecified site, without tophus (tophi): Secondary | ICD-10-CM | POA: Diagnosis not present

## 2011-06-21 DIAGNOSIS — J309 Allergic rhinitis, unspecified: Secondary | ICD-10-CM | POA: Diagnosis not present

## 2011-06-30 DIAGNOSIS — J309 Allergic rhinitis, unspecified: Secondary | ICD-10-CM | POA: Diagnosis not present

## 2011-07-07 DIAGNOSIS — J029 Acute pharyngitis, unspecified: Secondary | ICD-10-CM | POA: Diagnosis not present

## 2011-07-15 DIAGNOSIS — J3089 Other allergic rhinitis: Secondary | ICD-10-CM | POA: Diagnosis not present

## 2011-07-15 DIAGNOSIS — J301 Allergic rhinitis due to pollen: Secondary | ICD-10-CM | POA: Diagnosis not present

## 2011-07-15 DIAGNOSIS — J3081 Allergic rhinitis due to animal (cat) (dog) hair and dander: Secondary | ICD-10-CM | POA: Diagnosis not present

## 2011-07-15 DIAGNOSIS — J309 Allergic rhinitis, unspecified: Secondary | ICD-10-CM | POA: Diagnosis not present

## 2011-07-15 DIAGNOSIS — H1045 Other chronic allergic conjunctivitis: Secondary | ICD-10-CM | POA: Diagnosis not present

## 2011-07-23 DIAGNOSIS — J309 Allergic rhinitis, unspecified: Secondary | ICD-10-CM | POA: Diagnosis not present

## 2011-07-30 DIAGNOSIS — J309 Allergic rhinitis, unspecified: Secondary | ICD-10-CM | POA: Diagnosis not present

## 2011-08-05 DIAGNOSIS — J309 Allergic rhinitis, unspecified: Secondary | ICD-10-CM | POA: Diagnosis not present

## 2011-08-12 DIAGNOSIS — J309 Allergic rhinitis, unspecified: Secondary | ICD-10-CM | POA: Diagnosis not present

## 2011-08-19 DIAGNOSIS — J309 Allergic rhinitis, unspecified: Secondary | ICD-10-CM | POA: Diagnosis not present

## 2011-08-25 DIAGNOSIS — J309 Allergic rhinitis, unspecified: Secondary | ICD-10-CM | POA: Diagnosis not present

## 2011-08-27 ENCOUNTER — Other Ambulatory Visit: Payer: Self-pay | Admitting: Internal Medicine

## 2011-09-02 DIAGNOSIS — J309 Allergic rhinitis, unspecified: Secondary | ICD-10-CM | POA: Diagnosis not present

## 2011-09-10 DIAGNOSIS — J309 Allergic rhinitis, unspecified: Secondary | ICD-10-CM | POA: Diagnosis not present

## 2011-09-23 DIAGNOSIS — J309 Allergic rhinitis, unspecified: Secondary | ICD-10-CM | POA: Diagnosis not present

## 2011-09-27 ENCOUNTER — Other Ambulatory Visit: Payer: Self-pay | Admitting: Internal Medicine

## 2011-09-30 DIAGNOSIS — H2589 Other age-related cataract: Secondary | ICD-10-CM | POA: Diagnosis not present

## 2011-09-30 DIAGNOSIS — J309 Allergic rhinitis, unspecified: Secondary | ICD-10-CM | POA: Diagnosis not present

## 2011-10-07 DIAGNOSIS — J309 Allergic rhinitis, unspecified: Secondary | ICD-10-CM | POA: Diagnosis not present

## 2011-10-07 DIAGNOSIS — M48061 Spinal stenosis, lumbar region without neurogenic claudication: Secondary | ICD-10-CM | POA: Diagnosis not present

## 2011-10-07 DIAGNOSIS — M1710 Unilateral primary osteoarthritis, unspecified knee: Secondary | ICD-10-CM | POA: Diagnosis not present

## 2011-10-14 DIAGNOSIS — J309 Allergic rhinitis, unspecified: Secondary | ICD-10-CM | POA: Diagnosis not present

## 2011-10-21 DIAGNOSIS — J309 Allergic rhinitis, unspecified: Secondary | ICD-10-CM | POA: Diagnosis not present

## 2011-10-28 DIAGNOSIS — M1710 Unilateral primary osteoarthritis, unspecified knee: Secondary | ICD-10-CM | POA: Diagnosis not present

## 2011-10-28 DIAGNOSIS — J309 Allergic rhinitis, unspecified: Secondary | ICD-10-CM | POA: Diagnosis not present

## 2011-10-28 DIAGNOSIS — S838X9A Sprain of other specified parts of unspecified knee, initial encounter: Secondary | ICD-10-CM | POA: Diagnosis not present

## 2011-10-28 DIAGNOSIS — M48061 Spinal stenosis, lumbar region without neurogenic claudication: Secondary | ICD-10-CM | POA: Diagnosis not present

## 2011-10-28 DIAGNOSIS — S86819A Strain of other muscle(s) and tendon(s) at lower leg level, unspecified leg, initial encounter: Secondary | ICD-10-CM | POA: Diagnosis not present

## 2011-11-01 ENCOUNTER — Other Ambulatory Visit: Payer: Self-pay | Admitting: Orthopaedic Surgery

## 2011-11-01 DIAGNOSIS — M545 Low back pain, unspecified: Secondary | ICD-10-CM

## 2011-11-02 ENCOUNTER — Ambulatory Visit
Admission: RE | Admit: 2011-11-02 | Discharge: 2011-11-02 | Disposition: A | Discharge status: Home / Self Care | Payer: Medicare Other | Source: Ambulatory Visit | Attending: Orthopaedic Surgery | Admitting: Orthopaedic Surgery

## 2011-11-02 DIAGNOSIS — M5124 Other intervertebral disc displacement, thoracic region: Secondary | ICD-10-CM | POA: Diagnosis not present

## 2011-11-02 DIAGNOSIS — M5126 Other intervertebral disc displacement, lumbar region: Secondary | ICD-10-CM | POA: Diagnosis not present

## 2011-11-02 DIAGNOSIS — M545 Low back pain, unspecified: Secondary | ICD-10-CM

## 2011-11-02 DIAGNOSIS — IMO0002 Reserved for concepts with insufficient information to code with codable children: Secondary | ICD-10-CM | POA: Diagnosis not present

## 2011-11-04 DIAGNOSIS — M545 Low back pain, unspecified: Secondary | ICD-10-CM | POA: Diagnosis not present

## 2011-11-04 DIAGNOSIS — J309 Allergic rhinitis, unspecified: Secondary | ICD-10-CM | POA: Diagnosis not present

## 2011-11-04 DIAGNOSIS — M48061 Spinal stenosis, lumbar region without neurogenic claudication: Secondary | ICD-10-CM | POA: Diagnosis not present

## 2011-11-12 DIAGNOSIS — J309 Allergic rhinitis, unspecified: Secondary | ICD-10-CM | POA: Diagnosis not present

## 2011-11-22 DIAGNOSIS — IMO0002 Reserved for concepts with insufficient information to code with codable children: Secondary | ICD-10-CM | POA: Diagnosis not present

## 2011-11-26 DIAGNOSIS — J309 Allergic rhinitis, unspecified: Secondary | ICD-10-CM | POA: Diagnosis not present

## 2011-12-07 DIAGNOSIS — J309 Allergic rhinitis, unspecified: Secondary | ICD-10-CM | POA: Diagnosis not present

## 2011-12-13 DIAGNOSIS — IMO0001 Reserved for inherently not codable concepts without codable children: Secondary | ICD-10-CM | POA: Diagnosis not present

## 2011-12-13 DIAGNOSIS — M159 Polyosteoarthritis, unspecified: Secondary | ICD-10-CM | POA: Diagnosis not present

## 2011-12-13 DIAGNOSIS — J309 Allergic rhinitis, unspecified: Secondary | ICD-10-CM | POA: Diagnosis not present

## 2011-12-13 DIAGNOSIS — M1A00X0 Idiopathic chronic gout, unspecified site, without tophus (tophi): Secondary | ICD-10-CM | POA: Diagnosis not present

## 2011-12-20 DIAGNOSIS — J309 Allergic rhinitis, unspecified: Secondary | ICD-10-CM | POA: Diagnosis not present

## 2011-12-23 ENCOUNTER — Other Ambulatory Visit: Payer: Self-pay | Admitting: Internal Medicine

## 2011-12-30 DIAGNOSIS — J301 Allergic rhinitis due to pollen: Secondary | ICD-10-CM | POA: Diagnosis not present

## 2011-12-30 DIAGNOSIS — H1045 Other chronic allergic conjunctivitis: Secondary | ICD-10-CM | POA: Diagnosis not present

## 2011-12-30 DIAGNOSIS — J3081 Allergic rhinitis due to animal (cat) (dog) hair and dander: Secondary | ICD-10-CM | POA: Diagnosis not present

## 2011-12-30 DIAGNOSIS — J309 Allergic rhinitis, unspecified: Secondary | ICD-10-CM | POA: Diagnosis not present

## 2011-12-30 DIAGNOSIS — J3089 Other allergic rhinitis: Secondary | ICD-10-CM | POA: Diagnosis not present

## 2012-01-13 DIAGNOSIS — J309 Allergic rhinitis, unspecified: Secondary | ICD-10-CM | POA: Diagnosis not present

## 2012-01-20 DIAGNOSIS — J309 Allergic rhinitis, unspecified: Secondary | ICD-10-CM | POA: Diagnosis not present

## 2012-01-21 DIAGNOSIS — J309 Allergic rhinitis, unspecified: Secondary | ICD-10-CM | POA: Diagnosis not present

## 2012-01-27 DIAGNOSIS — J309 Allergic rhinitis, unspecified: Secondary | ICD-10-CM | POA: Diagnosis not present

## 2012-02-07 DIAGNOSIS — J309 Allergic rhinitis, unspecified: Secondary | ICD-10-CM | POA: Diagnosis not present

## 2012-02-10 DIAGNOSIS — J309 Allergic rhinitis, unspecified: Secondary | ICD-10-CM | POA: Diagnosis not present

## 2012-02-15 DIAGNOSIS — J309 Allergic rhinitis, unspecified: Secondary | ICD-10-CM | POA: Diagnosis not present

## 2012-02-18 DIAGNOSIS — J309 Allergic rhinitis, unspecified: Secondary | ICD-10-CM | POA: Diagnosis not present

## 2012-02-21 ENCOUNTER — Other Ambulatory Visit: Payer: Self-pay | Admitting: Obstetrics and Gynecology

## 2012-02-21 DIAGNOSIS — Z1231 Encounter for screening mammogram for malignant neoplasm of breast: Secondary | ICD-10-CM

## 2012-02-22 ENCOUNTER — Other Ambulatory Visit: Payer: Self-pay | Admitting: Internal Medicine

## 2012-02-22 DIAGNOSIS — J309 Allergic rhinitis, unspecified: Secondary | ICD-10-CM | POA: Diagnosis not present

## 2012-02-23 ENCOUNTER — Other Ambulatory Visit: Payer: Self-pay | Admitting: General Practice

## 2012-02-23 MED ORDER — DILTIAZEM HCL ER COATED BEADS 240 MG PO CP24: 240.0000 mg | ORAL_CAPSULE | Freq: Every day | ORAL | Status: DC

## 2012-02-28 DIAGNOSIS — J309 Allergic rhinitis, unspecified: Secondary | ICD-10-CM | POA: Diagnosis not present

## 2012-03-02 DIAGNOSIS — H903 Sensorineural hearing loss, bilateral: Secondary | ICD-10-CM | POA: Diagnosis not present

## 2012-03-10 DIAGNOSIS — J309 Allergic rhinitis, unspecified: Secondary | ICD-10-CM | POA: Diagnosis not present

## 2012-03-16 DIAGNOSIS — J309 Allergic rhinitis, unspecified: Secondary | ICD-10-CM | POA: Diagnosis not present

## 2012-03-21 DIAGNOSIS — M1710 Unilateral primary osteoarthritis, unspecified knee: Secondary | ICD-10-CM | POA: Diagnosis not present

## 2012-03-22 ENCOUNTER — Other Ambulatory Visit: Payer: Self-pay | Admitting: Internal Medicine

## 2012-03-24 DIAGNOSIS — J309 Allergic rhinitis, unspecified: Secondary | ICD-10-CM | POA: Diagnosis not present

## 2012-03-27 ENCOUNTER — Ambulatory Visit
Admission: RE | Admit: 2012-03-27 | Discharge: 2012-03-27 | Disposition: A | Discharge status: Home / Self Care | Payer: Medicare Other | Source: Ambulatory Visit | Attending: Obstetrics and Gynecology | Admitting: Obstetrics and Gynecology

## 2012-03-27 DIAGNOSIS — Z1231 Encounter for screening mammogram for malignant neoplasm of breast: Secondary | ICD-10-CM

## 2012-03-27 DIAGNOSIS — H903 Sensorineural hearing loss, bilateral: Secondary | ICD-10-CM | POA: Diagnosis not present

## 2012-03-31 DIAGNOSIS — J309 Allergic rhinitis, unspecified: Secondary | ICD-10-CM | POA: Diagnosis not present

## 2012-04-06 DIAGNOSIS — J309 Allergic rhinitis, unspecified: Secondary | ICD-10-CM | POA: Diagnosis not present

## 2012-04-14 DIAGNOSIS — J309 Allergic rhinitis, unspecified: Secondary | ICD-10-CM | POA: Diagnosis not present

## 2012-04-18 DIAGNOSIS — M1710 Unilateral primary osteoarthritis, unspecified knee: Secondary | ICD-10-CM | POA: Diagnosis not present

## 2012-04-20 DIAGNOSIS — J309 Allergic rhinitis, unspecified: Secondary | ICD-10-CM | POA: Diagnosis not present

## 2012-04-26 DIAGNOSIS — J309 Allergic rhinitis, unspecified: Secondary | ICD-10-CM | POA: Diagnosis not present

## 2012-04-26 DIAGNOSIS — Z23 Encounter for immunization: Secondary | ICD-10-CM | POA: Diagnosis not present

## 2012-05-04 DIAGNOSIS — J309 Allergic rhinitis, unspecified: Secondary | ICD-10-CM | POA: Diagnosis not present

## 2012-05-08 ENCOUNTER — Encounter (HOSPITAL_COMMUNITY): Payer: Self-pay | Admitting: Pharmacy Technician

## 2012-05-09 ENCOUNTER — Other Ambulatory Visit (HOSPITAL_COMMUNITY): Payer: Self-pay | Admitting: Orthopaedic Surgery

## 2012-05-09 ENCOUNTER — Encounter: Payer: Medicare Other | Admitting: Internal Medicine

## 2012-05-10 ENCOUNTER — Other Ambulatory Visit (HOSPITAL_COMMUNITY): Payer: Self-pay

## 2012-05-10 ENCOUNTER — Encounter (HOSPITAL_COMMUNITY)
Admission: RE | Admit: 2012-05-10 | Discharge: 2012-05-10 | Disposition: A | Discharge status: Home / Self Care | Payer: Medicare Other | Source: Ambulatory Visit | Attending: Orthopaedic Surgery | Admitting: Orthopaedic Surgery

## 2012-05-10 ENCOUNTER — Encounter (HOSPITAL_COMMUNITY): Payer: Self-pay

## 2012-05-10 ENCOUNTER — Other Ambulatory Visit (HOSPITAL_COMMUNITY): Payer: Self-pay | Admitting: Orthopaedic Surgery

## 2012-05-10 DIAGNOSIS — I10 Essential (primary) hypertension: Secondary | ICD-10-CM | POA: Diagnosis not present

## 2012-05-10 DIAGNOSIS — Z01818 Encounter for other preprocedural examination: Secondary | ICD-10-CM | POA: Diagnosis not present

## 2012-05-10 DIAGNOSIS — D62 Acute posthemorrhagic anemia: Secondary | ICD-10-CM | POA: Diagnosis not present

## 2012-05-10 DIAGNOSIS — J45909 Unspecified asthma, uncomplicated: Secondary | ICD-10-CM | POA: Diagnosis not present

## 2012-05-10 DIAGNOSIS — M1710 Unilateral primary osteoarthritis, unspecified knee: Secondary | ICD-10-CM | POA: Diagnosis not present

## 2012-05-10 DIAGNOSIS — J309 Allergic rhinitis, unspecified: Secondary | ICD-10-CM | POA: Diagnosis not present

## 2012-05-10 HISTORY — DX: Shortness of breath: R06.02

## 2012-05-10 HISTORY — DX: Gastro-esophageal reflux disease without esophagitis: K21.9

## 2012-05-10 LAB — COMPREHENSIVE METABOLIC PANEL
ALT: 43 U/L — ABNORMAL HIGH (ref 0–35)
Albumin: 4.2 g/dL (ref 3.5–5.2)
Alkaline Phosphatase: 93 U/L (ref 39–117)
Chloride: 103 mEq/L (ref 96–112)
Glucose, Bld: 104 mg/dL — ABNORMAL HIGH (ref 70–99)
Potassium: 3.4 mEq/L — ABNORMAL LOW (ref 3.5–5.1)
Sodium: 142 mEq/L (ref 135–145)
Total Bilirubin: 0.6 mg/dL (ref 0.3–1.2)
Total Protein: 7.8 g/dL (ref 6.0–8.3)

## 2012-05-10 LAB — CBC
Hemoglobin: 15.7 g/dL — ABNORMAL HIGH (ref 12.0–15.0)
MCHC: 35 g/dL (ref 30.0–36.0)
RDW: 13 % (ref 11.5–15.5)
WBC: 11.6 10*3/uL — ABNORMAL HIGH (ref 4.0–10.5)

## 2012-05-10 LAB — URINALYSIS, ROUTINE W REFLEX MICROSCOPIC
Bilirubin Urine: NEGATIVE
Glucose, UA: NEGATIVE mg/dL
Hgb urine dipstick: NEGATIVE
Ketones, ur: NEGATIVE mg/dL
Protein, ur: NEGATIVE mg/dL
pH: 5 (ref 5.0–8.0)

## 2012-05-10 LAB — TYPE AND SCREEN: Antibody Screen: NEGATIVE

## 2012-05-10 LAB — SURGICAL PCR SCREEN: MRSA, PCR: NEGATIVE

## 2012-05-10 NOTE — Pre-Procedure Instructions (Addendum)
19 Kristy [name]  05/10/2012   Your procedure is scheduled on: 05/19/12  Report to [facility] at 830 AM.  Call this number if you have problems the morning of surgery: [phone]   Remember:   Do not eat foodor drink:After Midnight.    Take these medicines the morning of surgery with A SIP OF WATER: diltiazem,inhaler, flexeril, eye drops, omeprazole ,   Do not wear jewelry, make-up or nail polish.  Do not wear lotions, powders, or perfumes. You may not wear deodorant.  Do not shave 48 hours prior to surgery. Men may shave face and neck.  Do not bring valuables to the hospital.  Contacts, dentures or bridgework may not be worn into surgery.  Leave suitcase in the car. After surgery it may be brought to your room.  For patients admitted to the hospital, checkout time is 11:00 AM the day of discharge.   Patients discharged the day of surgery will not be allowed to drive home.  Name and phone number of your driver: spouse [phone]  Special Instructions: Shower using CHG 2 nights before surgery and the night before surgery.  If you shower the day of surgery use CHG.  Use special wash - you have one bottle of CHG for all showers.  You should use approximately 1/3 of the bottle for each shower.   Please read over the following fact sheets that you were given: Pain Booklet, Coughing and Deep Breathing, Blood Transfusion Information, Total Joint Packet, MRSA Information and Surgical Site Infection Prevention

## 2012-05-16 NOTE — H&P (Addendum)
TOTAL KNEE ADMISSION H&P  Patient is being admitted for left total knee arthroplasty.  Subjective:  Chief Complaint:left knee pain.  HPI: Kristy [name], 66 y.o. female, has a history of pain and functional disability in the left knee due to arthritis and has failed non-surgical conservative treatments for greater than 12 weeks to includecorticosteriod injections, use of assistive devices and activity modification.  Onset of symptoms was gradual, starting 5 years ago with gradually worsening course since that time. The patient noted prior procedures on the knee to include  arthroscopy for left lateral meniscectomy as a youth. She has also had right knee medial meniscectomy as a youth.  She has developed windswept knee deformity. of the  knee(s).  Patient currently rates pain in the left knee(s) at 8 out of 10 with activity. Patient has night pain, worsening of pain with activity and weight bearing, joint swelling and locking of the left knee..  Patient has evidence of subchondral sclerosis, periarticular osteophytes, joint space narrowing and and 15 degress of valgus deformity of the left knee by imaging studies. This patient has had frequent locking of the knee causing severe pain.. There is no active infection.  Patient Active Problem List   Diagnosis Date Noted  . Routine health maintenance 05/06/2011  . Asthma   . Arthritis   . Hypertension   . Hiatal hernia   . DUB (dysfunctional uterine bleeding)   . SUI (stress urinary incontinence, female)   . Atrophic vaginitis   . Fibroid   . GOUT, UNSPECIFIED 04/28/2010  . Other abnormal glucose 04/28/2010  . LABYRINTHITIS, CHRONIC 07/30/2008  . HEARING LOSS 07/30/2008  . CHEST PAIN, EXERTIONAL 02/13/2008  . HYPERTENSION 08/19/2007  . ALLERGIC RHINITIS 08/19/2007  . ASTHMA 08/19/2007  . ACID REFLUX DISEASE 08/19/2007  . HIATAL HERNIA 08/19/2007  . IRRITABLE BOWEL SYNDROME 08/19/2007  . DEGENERATIVE DISC DISEASE 08/19/2007  . FIBROMYALGIA  08/19/2007  . FATIGUE, CHRONIC 08/19/2007  . CHOLECYSTECTOMY, LAPAROSCOPIC, HX OF 08/19/2007  . CARPAL TUNNEL RELEASE, RIGHT, HX OF 08/19/2007   Past Medical History  Diagnosis Date  . Asthma   . Hypertension   . Hiatal hernia   . DUB (dysfunctional uterine bleeding)   . SUI (stress urinary incontinence, female)   . Atrophic vaginitis   . Fibroid   . Fibromyalgia   . Reflux   . Shortness of breath     occ  . GERD (gastroesophageal reflux disease)   . Arthritis     generalized joint pain, including back    Past Surgical History  Procedure Date  . Abdominal hysterectomy 1994    TAH,BSO-BLADDER NECK SUSPENSION  . Surgery for poserior tibial tendon 2003  . Cystocele repair 1994  . Cholecystectomy, laparoscopic 2001  . Tubal ligation   . Carpal tunnel release 2005  . Triple fusion left ankle 2007  . Oophorectomy     BSO  . Breast surgery     LUMPECTOMY-BENIGN Breast Bx X2  . Tonsillectomy   . Knee arthroscopy     bil    Prescriptions prior to admission  Medication Sig Dispense Refill  . Azelastine HCl (ASTEPRO) 0.15 % SOLN Place 2 sprays into the nose daily.      . beclomethasone (QVAR) 80 MCG/ACT inhaler Inhale 1 puff into the lungs 2 (two) times daily.      Marland Kitchen conjugated estrogens (PREMARIN) vaginal cream 0.5 gram in vagina three times a week  45 g  3  . diltiazem (CARDIZEM CD) 240 MG 24 hr  capsule Take 1 capsule (240 mg total) by mouth daily.  30 capsule  2  . furosemide (LASIX) 40 MG tablet TAKE 1 TABLET (40 MG TOTAL) BY MOUTH DAILY.  30 tablet  6  . levocetirizine (XYZAL) 5 MG tablet Take 5 mg by mouth daily.      Marland Kitchen losartan (COZAAR) 50 MG tablet Take 50 mg by mouth daily.      . nortriptyline (PAMELOR) 25 MG capsule Take 25 mg by mouth at bedtime.        . Olopatadine HCl (PATADAY) 0.2 % SOLN Place 1 drop into both eyes daily.      Marland Kitchen omeprazole (PRILOSEC) 40 MG capsule Take 40 mg by mouth daily.      Marland Kitchen EPINEPHrine (EPIPEN) 0.3 mg/0.3 mL DEVI Inject 0.3 mg into the  muscle as needed. For allergic reactions      . levalbuterol (XOPENEX HFA) 45 MCG/ACT inhaler Inhale 2 puffs into the lungs every 4 (four) hours as needed. For shortness of breath       Allergies  Allergen Reactions  . Iodine Anaphylaxis  . Shellfish Allergy Anaphylaxis  . Sulfonamide Derivatives Rash  . Other Swelling    Citrus  Sometimes Lactose intolerant  . Amoxicillin Diarrhea    History  Substance Use Topics  . Smoking status: Never Smoker   . Smokeless tobacco: Never Used  . Alcohol Use: Yes     Comment: rare    Family History  Problem Relation Age of Onset  . Breast cancer Mother   . Breast cancer Sister   . Hypertension Sister   . Cancer Father     Brain tumor  . Cancer Maternal Aunt   . Cancer Maternal Uncle   . Hemochromatosis Sister   . Thalassemia Sister      Review of Systems  Constitutional: Negative.   HENT: Negative.   Eyes: Negative.   Respiratory: Negative.   Cardiovascular: Negative.   Gastrointestinal: Negative.   Genitourinary: Negative.   Musculoskeletal: Positive for joint pain.  Skin: Negative.   Neurological: Negative.   Endo/Heme/Allergies: Negative.   Psychiatric/Behavioral: Negative.     Objective:  Physical Exam  Constitutional: She is oriented to person, place, and time. She appears well-developed and well-nourished.  HENT:  Head: Normocephalic and atraumatic.  Eyes: EOM are normal. Pupils are equal, round, and reactive to light.  Neck: Normal range of motion. Neck supple.  Cardiovascular: Normal rate, regular rhythm and intact distal pulses.   Respiratory: Effort normal and breath sounds normal.  GI: Soft. Bowel sounds are normal.  Musculoskeletal: She exhibits edema.       Left knee with crepitus.  Well healed arthrotomy scar.  Tender joint line. Valgus deformity of left knee. Varus deformity of right knee  Trace edema of lower legs  Neurological: She is alert and oriented to person, place, and time.  Skin: Skin is warm  and dry.  Psychiatric: She has a normal mood and affect.    Vital signs in last 24 hours: Temp:  [97.5 F (36.4 C)] 97.5 F (36.4 C) (12/20 0834) Pulse Rate:  [97-101] 97  (12/20 1000) Resp:  [18-20] 20  (12/20 1000) BP: (126)/(65) 126/65 mmHg (12/20 0834) SpO2:  [95 %-100 %] 100 % (12/20 1000)  Labs:   Estimated Body mass index is 35.11 kg/(m^2) as calculated from the following:   Height as of 05/05/11: 5\' 5" (1.651 m).   Weight as of 05/05/11: 211 lb(95.709 kg).   Imaging Review Plain radiographs demonstrate  severe degenerative joint disease of the left knee(s). The overall alignment issignificant valgus. The bone quality appears to be adequate for age and reported activity level.  Assessment/Plan:  End stage arthritis, left knee   The patient history, physical examination, clinical judgment of the provider and imaging studies are consistent with end stage degenerative joint disease of the left knee(s) and total knee arthroplasty is deemed medically necessary. The treatment options including medical management, injection therapy arthroscopy and arthroplasty were discussed at length. The risks and benefits of total knee arthroplasty were presented and reviewed. The risks due to aseptic loosening, infection, stiffness, patella tracking problems, thromboembolic complications and other imponderables were discussed. The patient acknowledged the explanation, agreed to proceed with the plan and consent was signed. Patient is being admitted for inpatient treatment for surgery, pain control, PT, OT, prophylactic antibiotics, VTE prophylaxis, progressive ambulation and ADL's and discharge planning. The patient is planning to be discharged home with home health services

## 2012-05-18 ENCOUNTER — Encounter: Payer: Self-pay | Admitting: Internal Medicine

## 2012-05-18 ENCOUNTER — Ambulatory Visit (INDEPENDENT_AMBULATORY_CARE_PROVIDER_SITE_OTHER): Payer: Medicare Other | Admitting: Internal Medicine

## 2012-05-18 VITALS — BP 130/78 | HR 104 | Temp 96.8°F | Resp 12 | Ht 64.5 in | Wt 209.1 lb

## 2012-05-18 DIAGNOSIS — IMO0002 Reserved for concepts with insufficient information to code with codable children: Secondary | ICD-10-CM

## 2012-05-18 DIAGNOSIS — I10 Essential (primary) hypertension: Secondary | ICD-10-CM

## 2012-05-18 DIAGNOSIS — Z0000 Encounter for general adult medical examination without abnormal findings: Secondary | ICD-10-CM

## 2012-05-18 DIAGNOSIS — M1710 Unilateral primary osteoarthritis, unspecified knee: Secondary | ICD-10-CM | POA: Diagnosis not present

## 2012-05-18 DIAGNOSIS — M109 Gout, unspecified: Secondary | ICD-10-CM

## 2012-05-18 DIAGNOSIS — IMO0001 Reserved for inherently not codable concepts without codable children: Secondary | ICD-10-CM

## 2012-05-18 DIAGNOSIS — M1712 Unilateral primary osteoarthritis, left knee: Secondary | ICD-10-CM

## 2012-05-18 MED ORDER — CEFAZOLIN SODIUM-DEXTROSE 2-3 GM-% IV SOLR
2.0000 g | INTRAVENOUS | Status: AC
  Administered 2012-05-19: 2 g via INTRAVENOUS
  Filled 2012-05-18: qty 50

## 2012-05-18 NOTE — Patient Instructions (Addendum)
Exam is normal except for that bum knee. Labs from preop look good with glucose of 104.  You are medically clear for surgery and should do well. Good wishes for a speedy recovery - it will be work  Please check on coverage for shingles vaccine.   Have a Happy [address]

## 2012-05-18 NOTE — Progress Notes (Signed)
Subjective:    Patient ID: Kristy [name], female    DOB: [birth year], 66 y.o.   MRN: [id number]  HPI The patient is here for annual Medicare wellness examination and management of other chronic and acute problems.   She is for left TKR Dec Dec 20th  The risk factors are reflected in the social history.  The roster of all physicians providing medical care to patient - is listed in the Snapshot section of the chart.  Activities of daily living:  The patient is 100% inedpendent in all ADLs: dressing, toileting, feeding as well as independent mobility  Home safety : The patient has smoke detectors in the home. Falls - fell in October '13. Home is fall safe  Including grab bars in the bathroom. They wear seatbelts.   firearms are present in the home, kept in a safe fashion. There is no violence in the home.   There is no risks for hepatitis, STDs or HIV. There is no history of blood transfusion. They have no travel history to infectious disease endemic areas of the world.  The patient has seen their dentist in the last six month. They have seen their eye doctor in the last year. They admits to hearing difficulty and have  had audiologic testing in the last year. She is using hearing aids.     They do not  have excessive sun exposure. Discussed the need for sun protection: hats, long sleeves and use of sunscreen if there is significant sun exposure.   Diet: the importance of a healthy diet is discussed. They do have a healthy diet.  The patient has no regular exercise program.  The benefits of regular aerobic exercise were discussed. Plans to resume exercise after TKR  Depression screen: there are no signs or vegative symptoms of depression- irritability, change in appetite, anhedonia, sadness/tearfullness.  Cognitive assessment: the patient manages all their financial and personal affairs and is actively engaged.   Past Medical History  Diagnosis Date  . Asthma   . Hypertension   .  Hiatal hernia   . DUB (dysfunctional uterine bleeding)   . SUI (stress urinary incontinence, female)   . Atrophic vaginitis   . Fibroid   . Fibromyalgia   . Reflux   . Shortness of breath     occ  . GERD (gastroesophageal reflux disease)   . Arthritis     generalized joint pain, including back   Past Surgical History  Procedure Date  . Abdominal hysterectomy 1994    TAH,BSO-BLADDER NECK SUSPENSION  . Surgery for poserior tibial tendon 2003  . Cystocele repair 1994  . Cholecystectomy, laparoscopic 2001  . Tubal ligation   . Carpal tunnel release 2005  . Triple fusion left ankle 2007  . Oophorectomy     BSO  . Breast surgery     LUMPECTOMY-BENIGN Breast Bx X2  . Tonsillectomy   . Knee arthroscopy     bil   Family History  Problem Relation Age of Onset  . Breast cancer Mother   . Breast cancer Sister   . Hypertension Sister   . Cancer Father     Brain tumor  . Cancer Maternal Aunt   . Cancer Maternal Uncle   . Hemochromatosis Sister   . Thalassemia Sister    History   Social History  . Marital Status: Married    Spouse Name: N/A    Number of Children: 1  . 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 Smoking status: Never Smoker   . Smokeless tobacco: Never Used  . Alcohol Use: Yes     Comment: rare  . Drug Use: No  . Sexually Active: No   Other Topics Concern  . 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 married '69. 2 sons - '70, '72: retired - [employer]. SO- good health.ACP - OK for DNR, no long term ventilation, no heroic measures in the face of poor quality of life. HCPOA - husband, secondary son Kristy [name] (c) [phone]    Current Outpatient Prescriptions on File Prior to Visit  Medication Sig Dispense Refill  . Azelastine HCl (ASTEPRO) 0.15 % SOLN Place 2 sprays into the nose daily.      . beclomethasone (QVAR) 80 MCG/ACT inhaler Inhale 1 puff into the lungs 2  (two) times daily.      Marland Kitchen conjugated estrogens (PREMARIN) vaginal cream 0.5 gram in vagina three times a week  45 g  3  . diltiazem (CARDIZEM CD) 240 MG 24 hr capsule Take 1 capsule (240 mg total) by mouth daily.  30 capsule  2  . EPINEPHrine (EPIPEN) 0.3 mg/0.3 mL DEVI Inject 0.3 mg into the muscle as needed. For allergic reactions      . furosemide (LASIX) 40 MG tablet TAKE 1 TABLET (40 MG TOTAL) BY MOUTH DAILY.  30 tablet  6  . levalbuterol (XOPENEX HFA) 45 MCG/ACT inhaler Inhale 2 puffs into the lungs every 4 (four) hours as needed. For shortness of breath      . levocetirizine (XYZAL) 5 MG tablet Take 5 mg by mouth daily.      Marland Kitchen losartan (COZAAR) 50 MG tablet Take 50 mg by mouth daily.      . nortriptyline (PAMELOR) 25 MG capsule Take 25 mg by mouth at bedtime.        . Olopatadine HCl (PATADAY) 0.2 % SOLN Place 1 drop into both eyes daily.      Marland Kitchen omeprazole (PRILOSEC) 40 MG capsule Take 40 mg by mouth daily.       Current Facility-Administered Medications on File Prior to Visit  Medication Dose Route Frequency Provider Last Rate Last Dose  . TDaP (BOOSTRIX) injection 0.5 mL  0.5 mL Intramuscular Once Jacques Navy, MD         Vision, hearing, body mass index were assessed and reviewed.   During the course of the visit the patient was educated and counseled about appropriate screening and preventive services including : fall prevention , diabetes screening, nutrition counseling, colorectal cancer screening, and recommended immunizations.    Review of Systems Constitutional:  Negative for fever, chills, activity change and unexpected weight change.  HEENT:  Negative for hearing loss, ear pain, congestion, neck stiffness and postnasal drip. Negative for sore throat or swallowing problems. Negative for dental complaints.   Eyes: Negative for vision loss or change in visual acuity.  Respiratory: Negative for chest tightness and wheezing. Negative for DOE.   Cardiovascular: Negative  for chest pain or palpitations. No decreased exercise tolerance Gastrointestinal: No change in bowel habit. No bloating or gas. No reflux or indigestion Genitourinary: Negative for urgency, frequency, flank pain and difficulty urinating.  Musculoskeletal: positive for myalgias, back pain, arthralgias and gait problem.  Neurological: Negative for dizziness, tremors, weakness and headaches.  Hematological: Negative for adenopathy.  Psychiatric/Behavioral: Negative for behavioral problems and dysphoric mood.  Objective:   Physical Exam Filed Vitals:   05/18/12 0949  BP: 130/78  Pulse: 104  Temp: 96.8 F (36 C)  Resp: 12   Wt Readings from Last 3 Encounters:  05/18/12 209 lb 1.9 oz (94.856 kg)  05/10/12 209 lb 12.8 oz (95.165 kg)  05/05/11 211 lb (95.709 kg)   Gen'l: well nourished, well developed, overweight white Woman in no distress HEENT - Damon/AT, EACs/TMs normal, oropharynx with native dentition in good condition, no buccal or palatal lesions, posterior pharynx clear, mucous membranes moist. C&S clear, PERRLA Neck - supple, no thyromegaly Nodes- negative submental, cervical, supraclavicular regions Chest - no deformity, no CVAT Lungs - clear without rales, wheezes. No increased work of breathing Breast - deferred to recent mammogram Cardiovascular - regular rate and rhythm, quiet precordium, no murmurs, rubs or gallops, 2+ radial, DP and PT pulses Abdomen - BS+ x 4, no HSM, no guarding or rebound or tenderness Pelvic - deferred to gyn Rectal - deferred to gyn Extremities - no clubbing, cyanosis, edema. Valgus deformity left knee.  Neuro - A&O x 3, CN II-XII normal, motor strength normal and equal, DTRs 2+ and symmetrical biceps, radial, and patellar tendons. Cerebellar - no tremor, no rigidity, fluid movement and normal gait. Derm - Head, neck, back, abdomen and extremities without suspicious lesions  Lab Results  Component Value Date   WBC 11.6* 05/10/2012   HGB 15.7*  05/10/2012   HCT 44.9 05/10/2012   PLT 275 05/10/2012   GLUCOSE 104* 05/10/2012   CHOL 164 04/28/2010   TRIG 108.0 04/28/2010   HDL 38.40* 04/28/2010   LDLCALC 104* 04/28/2010   ALT 43* 05/10/2012   AST 30 05/10/2012   NA 142 05/10/2012   K 3.4* 05/10/2012   CL 103 05/10/2012   CREATININE 0.69 05/10/2012   BUN 15 05/10/2012   CO2 24 05/10/2012   TSH 1.32 02/24/2009   HGBA1C 6.4 05/05/2011         Assessment & Plan:

## 2012-05-19 ENCOUNTER — Encounter (HOSPITAL_COMMUNITY): Payer: Self-pay | Admitting: Anesthesiology

## 2012-05-19 ENCOUNTER — Inpatient Hospital Stay (HOSPITAL_COMMUNITY): Payer: Medicare Other | Admitting: Anesthesiology

## 2012-05-19 ENCOUNTER — Encounter (HOSPITAL_COMMUNITY)
Admission: RE | Disposition: A | Discharge status: Home Health | Payer: Self-pay | Source: Ambulatory Visit | Attending: Orthopaedic Surgery

## 2012-05-19 ENCOUNTER — Inpatient Hospital Stay (HOSPITAL_COMMUNITY)
Admission: RE | Admit: 2012-05-19 | Discharge: 2012-05-22 | DRG: 470 | Disposition: A | Discharge status: Home Health | Payer: Medicare Other | Source: Ambulatory Visit | Attending: Orthopaedic Surgery | Admitting: Orthopaedic Surgery

## 2012-05-19 DIAGNOSIS — Z7982 Long term (current) use of aspirin: Secondary | ICD-10-CM | POA: Diagnosis not present

## 2012-05-19 DIAGNOSIS — Z881 Allergy status to other antibiotic agents status: Secondary | ICD-10-CM

## 2012-05-19 DIAGNOSIS — Z01812 Encounter for preprocedural laboratory examination: Secondary | ICD-10-CM | POA: Diagnosis not present

## 2012-05-19 DIAGNOSIS — IMO0001 Reserved for inherently not codable concepts without codable children: Secondary | ICD-10-CM | POA: Diagnosis present

## 2012-05-19 DIAGNOSIS — I10 Essential (primary) hypertension: Secondary | ICD-10-CM | POA: Diagnosis not present

## 2012-05-19 DIAGNOSIS — J45909 Unspecified asthma, uncomplicated: Secondary | ICD-10-CM | POA: Diagnosis not present

## 2012-05-19 DIAGNOSIS — M109 Gout, unspecified: Secondary | ICD-10-CM | POA: Diagnosis present

## 2012-05-19 DIAGNOSIS — Z79899 Other long term (current) drug therapy: Secondary | ICD-10-CM

## 2012-05-19 DIAGNOSIS — M1710 Unilateral primary osteoarthritis, unspecified knee: Principal | ICD-10-CM | POA: Diagnosis present

## 2012-05-19 DIAGNOSIS — Z8249 Family history of ischemic heart disease and other diseases of the circulatory system: Secondary | ICD-10-CM

## 2012-05-19 DIAGNOSIS — D62 Acute posthemorrhagic anemia: Secondary | ICD-10-CM | POA: Diagnosis not present

## 2012-05-19 DIAGNOSIS — M1712 Unilateral primary osteoarthritis, left knee: Secondary | ICD-10-CM

## 2012-05-19 DIAGNOSIS — Z882 Allergy status to sulfonamides status: Secondary | ICD-10-CM | POA: Diagnosis not present

## 2012-05-19 DIAGNOSIS — K219 Gastro-esophageal reflux disease without esophagitis: Secondary | ICD-10-CM | POA: Diagnosis present

## 2012-05-19 DIAGNOSIS — G8918 Other acute postprocedural pain: Secondary | ICD-10-CM | POA: Diagnosis not present

## 2012-05-19 DIAGNOSIS — K449 Diaphragmatic hernia without obstruction or gangrene: Secondary | ICD-10-CM | POA: Diagnosis present

## 2012-05-19 DIAGNOSIS — R0602 Shortness of breath: Secondary | ICD-10-CM | POA: Diagnosis not present

## 2012-05-19 HISTORY — PX: KNEE ARTHROPLASTY: SHX992

## 2012-05-19 SURGERY — ARTHROPLASTY, KNEE, TOTAL, USING IMAGELESS COMPUTER-ASSISTED NAVIGATION
Anesthesia: General | Site: Knee | Laterality: Left | Wound class: Clean

## 2012-05-19 MED ORDER — MIDAZOLAM HCL 5 MG/5ML IJ SOLN
INTRAMUSCULAR | Status: DC | PRN
  Administered 2012-05-19: 2 mg via INTRAVENOUS

## 2012-05-19 MED ORDER — SODIUM CHLORIDE 0.9 % IR SOLN
Status: DC | PRN
  Administered 2012-05-19: 3000 mL

## 2012-05-19 MED ORDER — MORPHINE SULFATE (PF) 1 MG/ML IV SOLN
INTRAVENOUS | Status: DC
  Administered 2012-05-19: 3 mg via INTRAVENOUS
  Administered 2012-05-19: 1.5 mg via INTRAVENOUS
  Administered 2012-05-20: 3 mg via INTRAVENOUS

## 2012-05-19 MED ORDER — LORATADINE 10 MG PO TABS
10.0000 mg | ORAL_TABLET | Freq: Every day | ORAL | Status: DC
  Administered 2012-05-19 – 2012-05-22 (×4): 10 mg via ORAL
  Filled 2012-05-19 (×5): qty 1

## 2012-05-19 MED ORDER — ACETAMINOPHEN 650 MG RE SUPP: 650.0000 mg | Freq: Four times a day (QID) | RECTAL | Status: DC | PRN

## 2012-05-19 MED ORDER — LACTATED RINGERS IV SOLN
INTRAVENOUS | Status: DC | PRN
  Administered 2012-05-19: 10:00:00 via INTRAVENOUS

## 2012-05-19 MED ORDER — SENNOSIDES-DOCUSATE SODIUM 8.6-50 MG PO TABS: 1.0000 | ORAL_TABLET | Freq: Every evening | ORAL | Status: DC | PRN

## 2012-05-19 MED ORDER — AZELASTINE HCL 0.15 % NA SOLN
2.0000 | Freq: Every day | NASAL | Status: DC
  Administered 2012-05-20 – 2012-05-22 (×3): 2 via NASAL
  Filled 2012-05-19 (×4): qty 30

## 2012-05-19 MED ORDER — NORTRIPTYLINE HCL 25 MG PO CAPS
25.0000 mg | ORAL_CAPSULE | Freq: Every day | ORAL | Status: DC
  Administered 2012-05-19 – 2012-05-21 (×3): 25 mg via ORAL
  Filled 2012-05-19 (×5): qty 1

## 2012-05-19 MED ORDER — 0.9 % SODIUM CHLORIDE (POUR BTL) OPTIME
TOPICAL | Status: DC | PRN
  Administered 2012-05-19: 1000 mL

## 2012-05-19 MED ORDER — LACTATED RINGERS IV SOLN
INTRAVENOUS | Status: DC
  Administered 2012-05-19: 10:00:00 via INTRAVENOUS

## 2012-05-19 MED ORDER — METHOCARBAMOL 500 MG PO TABS
500.0000 mg | ORAL_TABLET | Freq: Four times a day (QID) | ORAL | Status: DC | PRN
  Filled 2012-05-19 (×2): qty 1

## 2012-05-19 MED ORDER — FUROSEMIDE 40 MG PO TABS
40.0000 mg | ORAL_TABLET | Freq: Every day | ORAL | Status: DC
  Administered 2012-05-20 – 2012-05-22 (×3): 40 mg via ORAL
  Filled 2012-05-19 (×3): qty 1

## 2012-05-19 MED ORDER — METOCLOPRAMIDE HCL 5 MG/ML IJ SOLN
10.0000 mg | Freq: Once | INTRAMUSCULAR | Status: AC
  Administered 2012-05-19: 10 mg via INTRAVENOUS

## 2012-05-19 MED ORDER — SODIUM CHLORIDE 0.9 % IJ SOLN: 9.0000 mL | INTRAMUSCULAR | Status: DC | PRN

## 2012-05-19 MED ORDER — HYDROMORPHONE HCL PF 1 MG/ML IJ SOLN
INTRAMUSCULAR | Status: DC | PRN
  Administered 2012-05-19 (×2): 0.5 mg via INTRAVENOUS

## 2012-05-19 MED ORDER — LOSARTAN POTASSIUM 50 MG PO TABS
50.0000 mg | ORAL_TABLET | Freq: Every day | ORAL | Status: DC
  Administered 2012-05-19 – 2012-05-22 (×4): 50 mg via ORAL
  Filled 2012-05-19 (×5): qty 1

## 2012-05-19 MED ORDER — PROPOFOL 10 MG/ML IV BOLUS
INTRAVENOUS | Status: DC | PRN
  Administered 2012-05-19: 200 mg via INTRAVENOUS
  Administered 2012-05-19: 50 mg via INTRAVENOUS

## 2012-05-19 MED ORDER — FENTANYL CITRATE 0.05 MG/ML IJ SOLN
INTRAMUSCULAR | Status: DC | PRN
  Administered 2012-05-19 (×7): 50 ug via INTRAVENOUS

## 2012-05-19 MED ORDER — DIPHENHYDRAMINE HCL 12.5 MG/5ML PO ELIX: 12.5000 mg | ORAL_SOLUTION | ORAL | Status: DC | PRN

## 2012-05-19 MED ORDER — METOCLOPRAMIDE HCL 5 MG/ML IJ SOLN: 5.0000 mg | Freq: Three times a day (TID) | INTRAMUSCULAR | Status: DC | PRN

## 2012-05-19 MED ORDER — ONDANSETRON HCL 4 MG/2ML IJ SOLN: 4.0000 mg | Freq: Four times a day (QID) | INTRAMUSCULAR | Status: DC | PRN

## 2012-05-19 MED ORDER — METOCLOPRAMIDE HCL 10 MG PO TABS: 5.0000 mg | ORAL_TABLET | Freq: Three times a day (TID) | ORAL | Status: DC | PRN

## 2012-05-19 MED ORDER — FENTANYL CITRATE 0.05 MG/ML IJ SOLN
INTRAMUSCULAR | Status: AC
  Filled 2012-05-19: qty 2

## 2012-05-19 MED ORDER — MIDAZOLAM HCL 2 MG/2ML IJ SOLN
1.0000 mg | INTRAMUSCULAR | Status: DC | PRN
  Administered 2012-05-19: 2 mg via INTRAVENOUS

## 2012-05-19 MED ORDER — NALOXONE HCL 0.4 MG/ML IJ SOLN: 0.4000 mg | INTRAMUSCULAR | Status: DC | PRN

## 2012-05-19 MED ORDER — HYDROMORPHONE HCL PF 1 MG/ML IJ SOLN
INTRAMUSCULAR | Status: AC
  Filled 2012-05-19: qty 1

## 2012-05-19 MED ORDER — METOCLOPRAMIDE HCL 5 MG/ML IJ SOLN
INTRAMUSCULAR | Status: AC
  Filled 2012-05-19: qty 2

## 2012-05-19 MED ORDER — PANTOPRAZOLE SODIUM 40 MG PO TBEC
80.0000 mg | DELAYED_RELEASE_TABLET | Freq: Every day | ORAL | Status: DC
  Administered 2012-05-19 – 2012-05-22 (×4): 80 mg via ORAL
  Filled 2012-05-19: qty 1
  Filled 2012-05-19 (×3): qty 2

## 2012-05-19 MED ORDER — CEFAZOLIN SODIUM 1-5 GM-% IV SOLN
1.0000 g | Freq: Four times a day (QID) | INTRAVENOUS | Status: AC
  Administered 2012-05-19 (×2): 1 g via INTRAVENOUS
  Filled 2012-05-19 (×3): qty 50

## 2012-05-19 MED ORDER — FENTANYL CITRATE 0.05 MG/ML IJ SOLN
100.0000 ug | Freq: Once | INTRAMUSCULAR | Status: AC
  Administered 2012-05-19: 100 ug via INTRAVENOUS

## 2012-05-19 MED ORDER — KCL IN DEXTROSE-NACL 20-5-0.45 MEQ/L-%-% IV SOLN
INTRAVENOUS | Status: DC
  Administered 2012-05-20: 05:00:00 via INTRAVENOUS
  Filled 2012-05-19 (×4): qty 1000

## 2012-05-19 MED ORDER — MORPHINE SULFATE (PF) 1 MG/ML IV SOLN
INTRAVENOUS | Status: AC
  Filled 2012-05-19: qty 25

## 2012-05-19 MED ORDER — FLEET ENEMA 7-19 GM/118ML RE ENEM: 1.0000 | ENEMA | Freq: Once | RECTAL | Status: AC | PRN

## 2012-05-19 MED ORDER — LIDOCAINE HCL (CARDIAC) 20 MG/ML IV SOLN
INTRAVENOUS | Status: DC | PRN
  Administered 2012-05-19: 80 mg via INTRAVENOUS

## 2012-05-19 MED ORDER — ONDANSETRON HCL 4 MG PO TABS: 4.0000 mg | ORAL_TABLET | Freq: Four times a day (QID) | ORAL | Status: DC | PRN

## 2012-05-19 MED ORDER — MIDAZOLAM HCL 2 MG/2ML IJ SOLN
INTRAMUSCULAR | Status: AC
  Filled 2012-05-19: qty 2

## 2012-05-19 MED ORDER — ZOLPIDEM TARTRATE 5 MG PO TABS: 5.0000 mg | ORAL_TABLET | Freq: Every evening | ORAL | Status: DC | PRN

## 2012-05-19 MED ORDER — OXYCODONE HCL 5 MG PO TABS
5.0000 mg | ORAL_TABLET | ORAL | Status: DC | PRN
  Administered 2012-05-20: 10 mg via ORAL
  Administered 2012-05-21 – 2012-05-22 (×2): 5 mg via ORAL
  Filled 2012-05-19 (×2): qty 2
  Filled 2012-05-19: qty 1

## 2012-05-19 MED ORDER — DILTIAZEM HCL ER COATED BEADS 240 MG PO CP24
240.0000 mg | ORAL_CAPSULE | Freq: Every day | ORAL | Status: DC
  Administered 2012-05-20 – 2012-05-22 (×3): 240 mg via ORAL
  Filled 2012-05-19 (×5): qty 1

## 2012-05-19 MED ORDER — DIPHENHYDRAMINE HCL 12.5 MG/5ML PO ELIX: 12.5000 mg | ORAL_SOLUTION | Freq: Four times a day (QID) | ORAL | Status: DC | PRN

## 2012-05-19 MED ORDER — KCL IN DEXTROSE-NACL 20-5-0.45 MEQ/L-%-% IV SOLN
INTRAVENOUS | Status: AC
  Filled 2012-05-19: qty 1000

## 2012-05-19 MED ORDER — OXYCODONE-ACETAMINOPHEN 5-325 MG PO TABS: ORAL_TABLET | ORAL | Status: DC

## 2012-05-19 MED ORDER — DOCUSATE SODIUM 100 MG PO CAPS
100.0000 mg | ORAL_CAPSULE | Freq: Two times a day (BID) | ORAL | Status: DC
  Administered 2012-05-20 – 2012-05-21 (×3): 100 mg via ORAL
  Filled 2012-05-19 (×6): qty 1

## 2012-05-19 MED ORDER — PHENOL 1.4 % MT LIQD: 1.0000 | OROMUCOSAL | Status: DC | PRN

## 2012-05-19 MED ORDER — METHOCARBAMOL 500 MG PO TABS: 500.0000 mg | ORAL_TABLET | Freq: Four times a day (QID) | ORAL | Status: DC | PRN

## 2012-05-19 MED ORDER — HYDROMORPHONE HCL PF 1 MG/ML IJ SOLN
0.2500 mg | INTRAMUSCULAR | Status: DC | PRN
  Administered 2012-05-19 (×3): 0.5 mg via INTRAVENOUS

## 2012-05-19 MED ORDER — METHOCARBAMOL 100 MG/ML IJ SOLN
500.0000 mg | Freq: Four times a day (QID) | INTRAVENOUS | Status: DC | PRN
  Administered 2012-05-19: 500 mg via INTRAVENOUS
  Filled 2012-05-19: qty 5

## 2012-05-19 MED ORDER — BISACODYL 10 MG RE SUPP: 10.0000 mg | Freq: Every day | RECTAL | Status: DC | PRN

## 2012-05-19 MED ORDER — FLUTICASONE PROPIONATE HFA 44 MCG/ACT IN AERO
1.0000 | INHALATION_SPRAY | Freq: Two times a day (BID) | RESPIRATORY_TRACT | Status: DC
  Administered 2012-05-20 – 2012-05-22 (×4): 1 via RESPIRATORY_TRACT
  Filled 2012-05-19 (×2): qty 10.6

## 2012-05-19 MED ORDER — ASPIRIN EC 325 MG PO TBEC
325.0000 mg | DELAYED_RELEASE_TABLET | Freq: Every day | ORAL | Status: DC
  Administered 2012-05-20 – 2012-05-22 (×3): 325 mg via ORAL
  Filled 2012-05-19 (×5): qty 1

## 2012-05-19 MED ORDER — ACETAMINOPHEN 325 MG PO TABS: 650.0000 mg | ORAL_TABLET | Freq: Four times a day (QID) | ORAL | Status: DC | PRN

## 2012-05-19 MED ORDER — LEVALBUTEROL TARTRATE 45 MCG/ACT IN AERO
2.0000 | INHALATION_SPRAY | RESPIRATORY_TRACT | Status: DC | PRN
  Filled 2012-05-19: qty 15

## 2012-05-19 MED ORDER — OLOPATADINE HCL 0.2 % OP SOLN: 1.0000 [drp] | Freq: Every day | OPHTHALMIC | Status: DC

## 2012-05-19 MED ORDER — EPINEPHRINE 0.3 MG/0.3ML IJ DEVI
0.3000 mg | INTRAMUSCULAR | Status: DC | PRN
  Filled 2012-05-19: qty 0.6

## 2012-05-19 MED ORDER — OLOPATADINE HCL 0.1 % OP SOLN
1.0000 [drp] | Freq: Two times a day (BID) | OPHTHALMIC | Status: DC
  Administered 2012-05-20 – 2012-05-22 (×4): 1 [drp] via OPHTHALMIC
  Filled 2012-05-19: qty 5

## 2012-05-19 MED ORDER — LEVOCETIRIZINE DIHYDROCHLORIDE 5 MG PO TABS: 5.0000 mg | ORAL_TABLET | Freq: Every day | ORAL | Status: DC

## 2012-05-19 MED ORDER — ONDANSETRON HCL 4 MG/2ML IJ SOLN
INTRAMUSCULAR | Status: DC | PRN
  Administered 2012-05-19: 4 mg via INTRAVENOUS

## 2012-05-19 MED ORDER — MORPHINE SULFATE 2 MG/ML IJ SOLN: 2.0000 mg | INTRAMUSCULAR | Status: DC | PRN

## 2012-05-19 MED ORDER — MENTHOL 3 MG MT LOZG
1.0000 | LOZENGE | OROMUCOSAL | Status: DC | PRN
  Filled 2012-05-19: qty 9

## 2012-05-19 MED ORDER — DIPHENHYDRAMINE HCL 50 MG/ML IJ SOLN: 12.5000 mg | Freq: Four times a day (QID) | INTRAMUSCULAR | Status: DC | PRN

## 2012-05-19 SURGICAL SUPPLY — 75 items
APL SKNCLS STERI-STRIP NONHPOA (GAUZE/BANDAGES/DRESSINGS) ×1
BANDAGE ELASTIC 4 VELCRO ST LF (GAUZE/BANDAGES/DRESSINGS) ×2 IMPLANT
BANDAGE ESMARK 6X9 LF (GAUZE/BANDAGES/DRESSINGS) ×1 IMPLANT
BENZOIN TINCTURE PRP APPL 2/3 (GAUZE/BANDAGES/DRESSINGS) ×2 IMPLANT
BLADE SAGITTAL 25.0X1.19X90 (BLADE) ×2 IMPLANT
BLADE SAW SGTL 13X75X1.27 (BLADE) ×2 IMPLANT
BLADE SURG 10 STRL SS (BLADE) ×1 IMPLANT
BNDG CMPR 9X6 STRL LF SNTH (GAUZE/BANDAGES/DRESSINGS) ×1
BNDG CMPR MED 10X6 ELC LF (GAUZE/BANDAGES/DRESSINGS) ×1
BNDG ELASTIC 6X10 VLCR STRL LF (GAUZE/BANDAGES/DRESSINGS) ×2 IMPLANT
BNDG ESMARK 6X9 LF (GAUZE/BANDAGES/DRESSINGS) ×2
BOWL SMART MIX CTS (DISPOSABLE) ×2 IMPLANT
CEMENT HV SMART SET (Cement) ×4 IMPLANT
CHLORAPREP W/TINT 26ML (MISCELLANEOUS) ×1 IMPLANT
CLOTH BEACON ORANGE TIMEOUT ST (SAFETY) ×2 IMPLANT
CLSR STERI-STRIP ANTIMIC 1/2X4 (GAUZE/BANDAGES/DRESSINGS) ×1 IMPLANT
COVER BACK TABLE 24X17X13 BIG (DRAPES) ×1 IMPLANT
COVER SURGICAL LIGHT HANDLE (MISCELLANEOUS) ×2 IMPLANT
CUFF TOURNIQUET SINGLE 34IN LL (TOURNIQUET CUFF) ×2 IMPLANT
CUFF TOURNIQUET SINGLE 44IN (TOURNIQUET CUFF) IMPLANT
DRAPE INCISE 23X17 IOBAN STRL (DRAPES) ×2
DRAPE INCISE 23X17 STRL (DRAPES) IMPLANT
DRAPE INCISE IOBAN 23X17 STRL (DRAPES) ×2 IMPLANT
DRAPE ORTHO SPLIT 77X108 STRL (DRAPES) ×4
DRAPE SURG ORHT 6 SPLT 77X108 (DRAPES) ×2 IMPLANT
DRAPE U-SHAPE 47X51 STRL (DRAPES) ×2 IMPLANT
DRSG PAD ABDOMINAL 8X10 ST (GAUZE/BANDAGES/DRESSINGS) ×4 IMPLANT
DURAPREP 26ML APPLICATOR (WOUND CARE) ×1 IMPLANT
ELECT REM PT RETURN 9FT ADLT (ELECTROSURGICAL) ×2
ELECTRODE REM PT RTRN 9FT ADLT (ELECTROSURGICAL) ×1 IMPLANT
FACESHIELD LNG OPTICON STERILE (SAFETY) ×2 IMPLANT
GAUZE XEROFORM 5X9 LF (GAUZE/BANDAGES/DRESSINGS) ×2 IMPLANT
GLOVE BIOGEL PI IND STRL 7.5 (GLOVE) ×1 IMPLANT
GLOVE BIOGEL PI IND STRL 8 (GLOVE) ×1 IMPLANT
GLOVE BIOGEL PI INDICATOR 7.5 (GLOVE) ×1
GLOVE BIOGEL PI INDICATOR 8 (GLOVE) ×1
GLOVE ECLIPSE 7.0 STRL STRAW (GLOVE) ×2 IMPLANT
GLOVE ORTHO TXT STRL SZ7.5 (GLOVE) ×2 IMPLANT
GOWN PREVENTION PLUS LG XLONG (DISPOSABLE) ×1 IMPLANT
GOWN PREVENTION PLUS XLARGE (GOWN DISPOSABLE) ×2 IMPLANT
GOWN STRL NON-REIN LRG LVL3 (GOWN DISPOSABLE) ×6 IMPLANT
HANDPIECE INTERPULSE COAX TIP (DISPOSABLE) ×2
IMMOBILIZER KNEE 22 UNIV (SOFTGOODS) ×3 IMPLANT
KIT BASIN OR (CUSTOM PROCEDURE TRAY) ×2 IMPLANT
KIT ROOM TURNOVER OR (KITS) ×2 IMPLANT
MANIFOLD NEPTUNE II (INSTRUMENTS) ×2 IMPLANT
MARKER SPHERE PSV REFLC THRD 5 (MARKER) ×6 IMPLANT
NDL 1/2 CIR MAYO (NEEDLE) ×1 IMPLANT
NDL HYPO 25GX1X1/2 BEV (NEEDLE) ×1 IMPLANT
NEEDLE 1/2 CIR MAYO (NEEDLE) ×2 IMPLANT
NEEDLE HYPO 25GX1X1/2 BEV (NEEDLE) ×2 IMPLANT
NS IRRIG 1000ML POUR BTL (IV SOLUTION) ×2 IMPLANT
PACK TOTAL JOINT (CUSTOM PROCEDURE TRAY) ×2 IMPLANT
PAD ARMBOARD 7.5X6 YLW CONV (MISCELLANEOUS) ×4 IMPLANT
PAD CAST 4YDX4 CTTN HI CHSV (CAST SUPPLIES) ×1 IMPLANT
PADDING CAST COTTON 4X4 STRL (CAST SUPPLIES) ×2
PADDING CAST COTTON 6X4 STRL (CAST SUPPLIES) ×3 IMPLANT
PIN SCHANZ 4MM 130MM (PIN) ×8 IMPLANT
SET HNDPC FAN SPRY TIP SCT (DISPOSABLE) ×1 IMPLANT
SPONGE GAUZE 4X4 12PLY (GAUZE/BANDAGES/DRESSINGS) ×2 IMPLANT
STAPLER VISISTAT 35W (STAPLE) ×2 IMPLANT
STRIP CLOSURE SKIN 1/2X4 (GAUZE/BANDAGES/DRESSINGS) ×4 IMPLANT
SUCTION FRAZIER TIP 10 FR DISP (SUCTIONS) ×2 IMPLANT
SUT ETHIBOND NAB CT1 #1 30IN (SUTURE) ×3 IMPLANT
SUT VIC AB 0 CT1 27 (SUTURE) ×2
SUT VIC AB 0 CT1 27XBRD ANBCTR (SUTURE) IMPLANT
SUT VIC AB 2-0 CT1 27 (SUTURE) ×6
SUT VIC AB 2-0 CT1 TAPERPNT 27 (SUTURE) ×2 IMPLANT
SUT VICRYL 4-0 PS2 18IN ABS (SUTURE) ×2 IMPLANT
SUT VICRYL AB 2 0 TIES (SUTURE) ×2 IMPLANT
SYR CONTROL 10ML LL (SYRINGE) ×2 IMPLANT
TOWEL OR 17X24 6PK STRL BLUE (TOWEL DISPOSABLE) ×2 IMPLANT
TOWEL OR 17X26 10 PK STRL BLUE (TOWEL DISPOSABLE) ×2 IMPLANT
TRAY FOLEY CATH 14FR (SET/KITS/TRAYS/PACK) ×2 IMPLANT
WATER STERILE IRR 1000ML POUR (IV SOLUTION) ×6 IMPLANT

## 2012-05-19 NOTE — Anesthesia Postprocedure Evaluation (Signed)
  Anesthesia Post-op Note  Patient: Kristy [name]  Procedure(s) Performed: Procedure(s) (LRB) with comments: COMPUTER ASSISTED TOTAL KNEE ARTHROPLASTY (Left) - Left  Total Knee Arthroplasty  Patient Location: PACU  Anesthesia Type:General  Level of Consciousness: awake  Airway and Oxygen Therapy: Patient Spontanous Breathing  Post-op Pain: mild  Post-op Assessment: Post-op Vital signs reviewed  Post-op Vital Signs: Reviewed  Complications: No apparent anesthesia complications

## 2012-05-19 NOTE — Anesthesia Preprocedure Evaluation (Addendum)
Anesthesia Evaluation  Patient identified by MRN, date of birth, ID band Patient awake    Reviewed: Allergy & Precautions, H&P , NPO status , Patient's Chart, lab work & pertinent test results  Airway Mallampati: II TM Distance: >3 FB Neck ROM: Full    Dental  (+) Teeth Intact and Dental Advisory Given   Pulmonary shortness of breath and with exertion, asthma ,          Cardiovascular hypertension, Pt. on medications     Neuro/Psych    GI/Hepatic hiatal hernia, GERD-  Medicated and Poorly Controlled,  Endo/Other    Renal/GU      Musculoskeletal  (+) Fibromyalgia -  Abdominal   Peds  Hematology   Anesthesia Other Findings   Reproductive/Obstetrics                          Anesthesia Physical Anesthesia Plan  ASA: III  Anesthesia Plan: General   Post-op Pain Management:    Induction:   Airway Management Planned: Oral ETT  Additional Equipment:   Intra-op Plan:   Post-operative Plan: Extubation in OR  Informed Consent: I have reviewed the patients History and Physical, chart, labs and discussed the procedure including the risks, benefits and alternatives for the proposed anesthesia with the patient or authorized representative who has indicated his/her understanding and acceptance.   Dental advisory given  Plan Discussed with: CRNA, Anesthesiologist and Surgeon  Anesthesia Plan Comments:         Anesthesia Quick Evaluation

## 2012-05-19 NOTE — Interval H&P Note (Signed)
History and Physical Interval Note:  05/19/2012 12:45 PM  Kristy [name]  has presented today for surgery, with the diagnosis of Left Knee Osteoarthritis  The various methods of treatment have been discussed with the patient and family. After consideration of risks, benefits and other options for treatment, the patient has consented to  Procedure(s) (LRB) with comments: COMPUTER ASSISTED TOTAL KNEE ARTHROPLASTY (Left) - Left  Total Knee Arthroplasty as a surgical intervention .  The patient's history has been reviewed, patient examined, no change in status, stable for surgery.  I have reviewed the patient's chart and labs.  Questions were answered to the patient's satisfaction.     [name] C

## 2012-05-19 NOTE — Op Note (Signed)
Test te  Preop diagnosis: Left knee osteoarthritis  Postop diagnosis: Left knee osteoarthritis  Procedure: Left cemented total knee arthroplasty, computer-assisted  Surgeon: Annell Greening M.D.  Asst.: Maud Deed PA-C  Anesthesia: Gen. plus preop femoral nerve block  Tourniquet time: 1 hour 6 minutes x350  EBL: Less than 100 cc  Procedure after induction general anesthesia the patient had standard draping prepping  Her Ancef prophylaxis timeout procedure completed.  Still skin Loraine Leriche was used Betadine Steri-Drape to the skin leg was wrapped an Esmarch and tourniquet was inflated.    This dictation is not working will be finished on the phone line since it is repeatedly cutting out more than 10 times and is unable be completed

## 2012-05-19 NOTE — Progress Notes (Signed)
Orthopedic Tech Progress Note Patient Details:  Kristy [name] 08/29/1945 [id number]  CPM Left Knee CPM Left Knee: On Left Knee Flexion (Degrees): 40  Left Knee Extension (Degrees): 0  Additional Comments: TRAPEZE BAR   Cammer, Antavius Sperbeck Carol 05/19/2012, 5:09 PM  

## 2012-05-19 NOTE — Progress Notes (Signed)
Orthopedic Tech Progress Note Patient Details:  Kristy [name] Oct 12, 1945 [id number]  CPM Left Knee CPM Left Knee: On Left Knee Flexion (Degrees): 40  Left Knee Extension (Degrees): 0  Additional Comments: TRAPEZE BAR   Shawnie Pons 05/19/2012, 5:09 PM

## 2012-05-19 NOTE — Brief Op Note (Cosign Needed)
05/19/2012  1:16 PM  PATIENT:  Mechele Dawley  66 y.o. female  PRE-OPERATIVE DIAGNOSIS:  Left Knee Osteoarthritis  POST-OPERATIVE DIAGNOSIS:  Left Knee Osteoarthritis  PROCEDURE:  Procedure(s) (LRB) with comments: COMPUTER ASSISTED TOTAL KNEE ARTHROPLASTY (Left) - Left  Total Knee Arthroplasty  SURGEON:  Surgeon(s) and Role:    * Eldred Manges, MD - Primary  PHYSICIAN ASSISTANT: Maud Deed [facility]  ASSISTANTS: none   ANESTHESIA:   general  EBL:  Total I/O In: 2000 [I.V.:2000] Out: 175 [Urine:175]  BLOOD ADMINISTERED:none  DRAINS: none   LOCAL MEDICATIONS USED:  NONE  SPECIMEN:  No Specimen  DISPOSITION OF SPECIMEN:  N/A  COUNTS:  YES  TOURNIQUET:  * Missing tourniquet times found for documented tourniquets in log:  [id number] *  DICTATION: .Note written in EPIC  PLAN OF CARE: Admit to inpatient   PATIENT DISPOSITION:  PACU - hemodynamically stable.   Delay start of Pharmacological VTE agent (>24hrs) due to surgical blood loss or risk of bleeding: no

## 2012-05-19 NOTE — Anesthesia Procedure Notes (Signed)
Anesthesia Regional Block:  Femoral nerve block  Pre-Anesthetic Checklist: ,, timeout performed, Correct Patient, Correct Site, Correct Laterality, Correct Procedure, Correct Position, site marked, Risks and benefits discussed,  Surgical consent,  Pre-op evaluation,  At surgeon's request and post-op pain management   Prep: chloraprep       Needles:  Injection technique: Single-shot      Additional Needles:  Procedures: Doppler guided and nerve stimulator Femoral nerve block  Nerve Stimulator or Paresthesia:  Response: 0.5 mA,   Additional Responses:   Narrative:  Start time: 05/19/2012 9:30 AM End time: 05/19/2012 9:45 AM  Performed by: Personally  Anesthesiologist: Dr. Randa Evens

## 2012-05-19 NOTE — Transfer of Care (Signed)
Immediate Anesthesia Transfer of Care Note  Patient: Kristy [name]  Procedure(s) Performed: Procedure(s) (LRB) with comments: COMPUTER ASSISTED TOTAL KNEE ARTHROPLASTY (Left) - Left  Total Knee Arthroplasty  Patient Location: PACU  Anesthesia Type:General and Regional  Level of Consciousness: sedated and patient cooperative  Airway & Oxygen Therapy: Patient Spontanous Breathing and Patient connected to nasal cannula oxygen  Post-op Assessment: Report given to PACU RN, Post -op Vital signs reviewed and stable and Patient moving all extremities X 4  Post vital signs: Reviewed and stable  Complications: No apparent anesthesia complications

## 2012-05-20 ENCOUNTER — Encounter (HOSPITAL_COMMUNITY): Payer: Self-pay | Admitting: *Deleted

## 2012-05-20 LAB — CBC
HCT: 25.9 % — ABNORMAL LOW (ref 36.0–46.0)
Hemoglobin: 8.4 g/dL — ABNORMAL LOW (ref 12.0–15.0)
MCH: 29.4 pg (ref 26.0–34.0)
MCHC: 32.4 g/dL (ref 30.0–36.0)

## 2012-05-20 LAB — BASIC METABOLIC PANEL
BUN: 8 mg/dL (ref 6–23)
Calcium: 8.3 mg/dL — ABNORMAL LOW (ref 8.4–10.5)
Creatinine, Ser: 0.47 mg/dL — ABNORMAL LOW (ref 0.50–1.10)
GFR calc non Af Amer: >90 mL/min (ref >90)
Glucose, Bld: 140 mg/dL — ABNORMAL HIGH (ref 70–99)

## 2012-05-20 NOTE — Progress Notes (Signed)
Physical Therapy Treatment Patient Details Name: Kristy [name] MRN: [id number] DOB: [birth year] Today's Date: 05/20/2012 Time: 2130-8657 PT Time Calculation (min): 25 min  PT Assessment / Plan / Recommendation Comments on Treatment Session  Pt made improvements from session this AM.  Should continue to progress well.      Follow Up Recommendations  Home health PT     Does the patient have the potential to tolerate intense rehabilitation     Barriers to Discharge        Equipment Recommendations  None recommended by PT    Recommendations for Other Services    Frequency 7X/week   Plan Discharge plan remains appropriate    Precautions / Restrictions Precautions Precautions: Knee Required Braces or Orthoses: Knee Immobilizer - Left Knee Immobilizer - Left: Other (comment) (not specified) Restrictions Weight Bearing Restrictions: Yes LLE Weight Bearing: Weight bearing as tolerated   Pertinent Vitals/Pain 6/10 L knee pain after coming out of CPM    Mobility  Bed Mobility Bed Mobility: Supine to Sit;Sit to Supine Supine to Sit: 4: Min assist;With rails Sit to Supine: 4: Min assist;With rail Details for Bed Mobility Assistance: Needs assist with LLE getting in and out of bed Transfers Transfers: Sit to Stand;Stand to Sit Sit to Stand: 4: Min assist;From bed Stand to Sit: 4: Min guard;With upper extremity assist;To bed Details for Transfer Assistance: Instructional cues for technique Ambulation/Gait Ambulation/Gait Assistance: 4: Min assist Ambulation Distance (Feet): 18 Feet Assistive device: Rolling walker Ambulation/Gait Assistance Details: gait much more fluid this pm and with better sequencing Gait Pattern: Step-to pattern Gait velocity: decreased Stairs: No    Exercises Total Joint Exercises Ankle Circles/Pumps: AROM;Both;Supine;5 reps Quad Sets: AROM;Left;5 reps;Supine Short Arc Quad: AAROM;Left;5 reps;Supine (very painful for pt) Hip ABduction/ADduction:  AAROM;10 reps;Left;Supine Straight Leg Raises: AAROM;Left;5 reps   PT Diagnosis: Difficulty walking  PT Problem List: Decreased strength;Decreased range of motion;Decreased mobility;Decreased knowledge of precautions;Decreased knowledge of use of DME;Pain PT Treatment Interventions: DME instruction;Gait training;Therapeutic exercise;Patient/family education   PT Goals Acute Rehab PT Goals PT Goal Formulation: With patient Time For Goal Achievement: 05/27/12 Potential to Achieve Goals: Good Pt will go Supine/Side to Sit: with supervision PT Goal: Supine/Side to Sit - Progress: Progressing toward goal Pt will go Sit to Stand: with supervision PT Goal: Sit to Stand - Progress: Progressing toward goal Pt will Ambulate: 51 - 150 feet;with supervision;with rolling walker PT Goal: Ambulate - Progress: Progressing toward goal Pt will Perform Home Exercise Program: with supervision, verbal cues required/provided PT Goal: Perform Home Exercise Program - Progress: Progressing toward goal  Visit Information  Last PT Received On: 05/20/12 Assistance Needed: +1    Subjective Data  Patient Stated Goal: To be able to get to the bathroom   Cognition  Overall Cognitive Status: Appears within functional limits for tasks assessed/performed Arousal/Alertness: Awake/alert Orientation Level: Oriented X4 / Intact Behavior During Session: [facility] for tasks performed    Balance     End of Session PT - End of Session Equipment Utilized During Treatment: Gait belt;Left knee immobilizer Activity Tolerance: Patient tolerated treatment well Patient left: in bed;with call bell/phone within reach Nurse Communication: Mobility status CPM Left Knee CPM Left Knee: Off   GP     Donnella Sham 05/20/2012, 2:21 PM Lavona Mound, PT  [phone] 05/20/2012

## 2012-05-20 NOTE — Progress Notes (Signed)
Patient ID: Kristy [name], female   DOB: July 24, 1945, 66 y.o.   MRN: [id number] Patient is postoperative day 1 left total knee arthroplasty. She complains of pain in the popliteal fossa. Discussed the importance of knee extension exercising.

## 2012-05-20 NOTE — Progress Notes (Signed)
Physical Therapy Evaluation Patient Details Name: Kristy [name] MRN: [id number] DOB: 19-Feb-1946 Today's Date: 05/20/2012 Time: 8119-1478 PT Time Calculation (min): 20 min  PT Assessment / Plan / Recommendation Clinical Impression  66 yo F s/p L TKA.  Anticipate pt will progress well enough with mobility to allow for DC home in the next several days.      PT Assessment  Patient needs continued PT services    Follow Up Recommendations  Home health PT    Does the patient have the potential to tolerate intense rehabilitation      Barriers to Discharge        Equipment Recommendations  None recommended by PT    Recommendations for Other Services     Frequency 7X/week    Precautions / Restrictions Precautions Required Braces or Orthoses: Knee Immobilizer - Left Restrictions Weight Bearing Restrictions: Yes LLE Weight Bearing: Weight bearing as tolerated   Pertinent Vitals/Pain 4/10 L knee at rest      Mobility  Bed Mobility Bed Mobility: Supine to Sit Supine to Sit: 3: Mod assist;With rails Transfers Transfers: Sit to Stand;Stand to Sit Sit to Stand: 3: Mod assist;From bed;With upper extremity assist Stand to Sit: 4: Min guard;With armrests;With upper extremity assist;To chair/3-in-1 Details for Transfer Assistance: Instructional cues for technique Ambulation/Gait Ambulation/Gait Assistance: 4: Min assist Ambulation Distance (Feet): 6 Feet (ambulated 6 ft twice) Assistive device: Rolling walker Ambulation/Gait Assistance Details: verbal and tactile cues for sequencing Gait Pattern: Step-to pattern Gait velocity: decreased Stairs: No         Exercises Total Joint Exercises Ankle Circles/Pumps: AROM;Both;10 reps;Supine   PT Diagnosis: Difficulty walking  PT Problem List: Decreased strength;Decreased range of motion;Decreased mobility;Decreased knowledge of precautions;Decreased knowledge of use of DME;Pain PT Treatment Interventions: DME instruction;Gait  training;Therapeutic exercise;Patient/family education   PT Goals Acute Rehab PT Goals PT Goal Formulation: With patient Time For Goal Achievement: 05/27/12 Potential to Achieve Goals: Good Pt will go Supine/Side to Sit: with supervision PT Goal: Supine/Side to Sit - Progress: Goal set today Pt will go Sit to Stand: with supervision PT Goal: Sit to Stand - Progress: Goal set today Pt will Ambulate: 51 - 150 feet;with supervision;with rolling walker PT Goal: Ambulate - Progress: Goal set today Pt will Perform Home Exercise Program: with supervision, verbal cues required/provided PT Goal: Perform Home Exercise Program - Progress: Goal set today  Visit Information  Last PT Received On: 05/20/12 Assistance Needed: +2    Subjective Data  Patient Stated Goal: To be able to get to the bathroom   Prior Functioning  Home Living Lives With: Spouse Available Help at Discharge: Family;Available 24 hours/day Type of Home: House Home Access: Ramped entrance Home Layout: One level Home Adaptive Equipment: Crutches;Walker - rolling;Walker - four wheeled;Straight cane;Bedside commode/3-in-1;Walker - standard;Wheelchair - manual Prior Function Level of Independence: Independent with assistive device(s) Communication Communication: No difficulties    Cognition  Overall Cognitive Status: Appears within functional limits for tasks assessed/performed Arousal/Alertness: Awake/alert Orientation Level: Oriented X4 / Intact Behavior During Session: [facility] for tasks performed    Extremity/Trunk Assessment Right Upper Extremity Assessment RUE ROM/Strength/Tone: [facility] for tasks assessed Left Upper Extremity Assessment LUE ROM/Strength/Tone: WFL for tasks assessed Right Lower Extremity Assessment RLE ROM/Strength/Tone: [facility] for tasks assessed Left Lower Extremity Assessment LLE ROM/Strength/Tone: Deficits LLE ROM/Strength/Tone Deficits: Limited knee ROM and strength   Balance    End of Session PT -  End of Session Equipment Utilized During Treatment: Gait belt;Left knee immobilizer Activity Tolerance: Patient  tolerated treatment well Patient left: in chair;with call bell/phone within reach Nurse Communication: Mobility status CPM Left Knee CPM Left Knee: Off       Donnella Sham 05/20/2012, 1:12 PM Lavona Mound, [place]  [phone] 05/20/2012

## 2012-05-20 NOTE — Op Note (Signed)
[name], [name]                 ACCOUNT NO.:  [id number]  MEDICAL RECORD NO.:  [id number]  LOCATION:  5N20C                        FACILITY:  [facility]  PHYSICIAN:  Ted Leonhart C. Ophelia Charter, M.D.    DATE OF BIRTH:  15-Nov-1945  DATE OF PROCEDURE:  05/19/2012 DATE OF DISCHARGE:                              OPERATIVE REPORT   PREOPERATIVE DIAGNOSIS:  Left knee osteoarthritis with valgus deformity and flexion contracture.  POSTOPERATIVE DIAGNOSIS:  Left knee osteoarthritis with valgus deformity and flexion contracture.  PROCEDURE:  Left cemented total knee arthroplasty computer assist.  SURGEON:  Arlyn Buerkle C. Ophelia Charter, MD  ASSISTANT:  Maud Deed, PA-C  Medically necessary and present for the entire procedure.   ANESTHESIA:  General plus preoperative femoral nerve block.  TOURNIQUET TIME:  One hour 6 minutes times 350.  EBL:  Less than 100 mL.  PROCEDURE:  After induction of general anesthesia, preoperative femoral nerve block, prepping and draping was performed.  Usual total knee split sheets, drapes, impervious stockinette, Coban, sterile skin marker and Betadine, Steri-Drape was applied.  Ancef 2 g was given.  Time-out procedure completed.  Midline incision was made.  Superficial retinaculum was divided.  Medial parapatellar incision was made flipping the patella over and removing 10 mm of bone off the patella.  Distal cut was made after computer pins were inserted inside the incision on the femur and stab incision mid tibia and models were made for both tibia and femur.  It suggested 9 mm off the high side.  Distal resection 9 mm in the femur was performed, verification and then anterior-posterior and chamfer cuts.  Box cut on the femur was delayed until after the tibia was cut.  Tibia was cut, needed some additional removal of bone to help posteriorly as well as in the lateral tibial plateau and then verification showed satisfactory position with less than 0.5 degree and less than 0.5 mm  off on height.  Kristy [name] was prepared, #3 on the femur, #3 on the tibia.  Box cut made on the femur.  Large osteophytes removed off of the posterior aspect of the femur as well as osteophytes on the lateral edge of the femur. Pulsatile lavage was used.  Three peg patella, 35 mm was selected. After pulsatile lavage, cement was vacuum mixed.  Trials have been used. There was good flexion, extension, balance and computer pins were removed.  The tibia was cemented first followed by femur, insertion of the 10 mm poly, and then holding the patella was cemented in, 3 pegs with clamp.  Alignment was satisfactory.  Tourniquet was deflated after 15 minutes when the cement was hard.  Instrument count and needle count was correct.  Procedure was just posted and after hemostasis, standard layered closure with #1 nonabsorbable in the deep retinaculum, 2-0 Vicryl in superficial retinaculum, subcu tissue, subcuticular skin closure, postop dressing, knee immobilizer.     Kristy [name] C. Ophelia Charter, M.D.     MCY/MEDQ  D:  05/19/2012  T:  05/20/2012  Job:  [id number]

## 2012-05-21 LAB — CBC
MCH: 28.9 pg (ref 26.0–34.0)
MCHC: 34.2 g/dL (ref 30.0–36.0)
Platelets: 211 10*3/uL (ref 150–400)
RDW: 13.2 % (ref 11.5–15.5)

## 2012-05-21 NOTE — Progress Notes (Signed)
Physical Therapy Treatment Patient Details Name: WENDE LONGSTRETH MRN: [id number] DOB: [birth year] Today's Date: 05/21/2012 Time: 1478-2956 PT Time Calculation (min): 16 min  PT Assessment / Plan / Recommendation Comments on Treatment Session  Pt moving well, improvements in gait this session. Pt still limited in quad strengthening during therex. Continue per plan, plan to d/c tomorrow    Follow Up Recommendations  Home health PT     Does the patient have the potential to tolerate intense rehabilitation     Barriers to Discharge        Equipment Recommendations  None recommended by PT    Recommendations for Other Services    Frequency 7X/week   Plan Discharge plan remains appropriate;Frequency remains appropriate    Precautions / Restrictions Precautions Precautions: Knee Required Braces or Orthoses: Knee Immobilizer - Left Restrictions Weight Bearing Restrictions: Yes LLE Weight Bearing: Weight bearing as tolerated   Pertinent Vitals/Pain Pain 5/10. RN aware and pain meds given    Mobility  Bed Mobility Bed Mobility: Not assessed Transfers Transfers: Sit to Stand;Stand to Sit Sit to Stand: 4: Min guard;With upper extremity assist;From chair/3-in-1 Stand to Sit: 4: Min guard;With upper extremity assist;To chair/3-in-1 Details for Transfer Assistance: Cues for safe technique and hand placement to/from RW. Pt able to control descent  Ambulation/Gait Ambulation/Gait Assistance: 4: Min guard Ambulation Distance (Feet): 100 Feet Assistive device: Rolling walker Ambulation/Gait Assistance Details: VC for proper sequencing. Pt with good step lengths, although still limited weight bearing through LLE Gait Pattern: Step-to pattern Gait velocity: decreased    Exercises Total Joint Exercises Quad Sets: AROM;Strengthening;Left;10 reps;Supine Heel Slides: AAROM;Strengthening;Left;10 reps;Seated Straight Leg Raises: AAROM;Left;10 reps;Supine Long Arc Quad:  Strengthening;AAROM;Left;10 reps;Seated Goniometric ROM: 0-65   PT Diagnosis:    PT Problem List:   PT Treatment Interventions:     PT Goals Acute Rehab PT Goals PT Goal: Sit to Stand - Progress: Progressing toward goal PT Goal: Ambulate - Progress: Progressing toward goal PT Goal: Perform Home Exercise Program - Progress: Progressing toward goal  Visit Information  Last PT Received On: 05/21/12 Assistance Needed: +1    Subjective Data      Cognition  Overall Cognitive Status: Appears within functional limits for tasks assessed/performed Arousal/Alertness: Awake/alert Orientation Level: Oriented X4 / Intact Behavior During Session: [facility] for tasks performed    Balance     End of Session PT - End of Session Equipment Utilized During Treatment: Gait belt;Left knee immobilizer Activity Tolerance: Patient tolerated treatment well Patient left: in chair;with call bell/phone within reach Nurse Communication: Mobility status   GP     Milana Kidney 05/21/2012, 12:01 PM

## 2012-05-21 NOTE — Progress Notes (Signed)
Physical Therapy Treatment Patient Details Name: SYREETA FIGLER MRN: [id number] DOB: 13-Feb-1946 Today's Date: 05/21/2012 Time: 4098-1191 PT Time Calculation (min): 19 min  PT Assessment / Plan / Recommendation Comments on Treatment Session  Treatment limited this session secondary to pt feeling nauseus during ambulation. Plan to d/c tomorrow, discussed safe bed mobility    Follow Up Recommendations  Home health PT     Does the patient have the potential to tolerate intense rehabilitation     Barriers to Discharge        Equipment Recommendations  None recommended by PT    Recommendations for Other Services    Frequency 7X/week   Plan Discharge plan remains appropriate;Frequency remains appropriate    Precautions / Restrictions Precautions Precautions: Knee Required Braces or Orthoses: Knee Immobilizer - Left Restrictions Weight Bearing Restrictions: Yes LLE Weight Bearing: Weight bearing as tolerated   Pertinent Vitals/Pain Pain 2/10 in knee.     Mobility  Bed Mobility Bed Mobility: Supine to Sit;Sitting - Scoot to Edge of Bed;Sit to Supine Supine to Sit: 4: Min assist;With rails Sitting - Scoot to Edge of Bed: 4: Min guard Sit to Supine: 4: Min assist;With rail Details for Bed Mobility Assistance: Min assist with LLE. Educated pt on use of RLE to assist with LLE. Cues for sequencing. Pt used rails and trapeze pain(has elevated at home) Transfers Transfers: Sit to Stand;Stand to Sit Sit to Stand: 4: Min guard;With upper extremity assist;From chair/3-in-1 Stand to Sit: 4: Min guard;With upper extremity assist;To chair/3-in-1 Details for Transfer Assistance: Cues still required for safe hand placement. Controlled stand/sit Ambulation/Gait Ambulation/Gait Assistance: 4: Min guard Ambulation Distance (Feet): 70 Feet Assistive device: Rolling walker Ambulation/Gait Assistance Details: Distance limited this session as pt became nauseus during ambulation. No KI this  session, pt able to control knee without any buckling Gait Pattern: Step-to pattern;Decreased hip/knee flexion - left Gait velocity: decreased    Exercises Total Joint Exercises Hip ABduction/ADduction: AAROM;10 reps;Left;Supine Straight Leg Raises: AAROM;Left;10 reps;Supine   PT Diagnosis:    PT Problem List:   PT Treatment Interventions:     PT Goals Acute Rehab PT Goals PT Goal: Supine/Side to Sit - Progress: Progressing toward goal PT Goal: Sit to Stand - Progress: Progressing toward goal PT Goal: Ambulate - Progress: Progressing toward goal PT Goal: Perform Home Exercise Program - Progress: Progressing toward goal  Visit Information  Last PT Received On: 05/21/12 Assistance Needed: +1    Subjective Data      Cognition  Overall Cognitive Status: Appears within functional limits for tasks assessed/performed Arousal/Alertness: Awake/alert Orientation Level: Oriented X4 / Intact Behavior During Session: [facility] for tasks performed    Balance     End of Session PT - End of Session Equipment Utilized During Treatment: Gait belt Activity Tolerance: Patient limited by fatigue Patient left: in bed;with call bell/phone within reach Nurse Communication: Mobility status CPM Left Knee CPM Left Knee: Off   GP     Milana Kidney 05/21/2012, 3:01 PM

## 2012-05-21 NOTE — Progress Notes (Signed)
Patient ID: Kristy [name], female   DOB: 10-27-1945, 66 y.o.   MRN: [id number] Patient making progress with physical therapy. Hemoglobin stable. Continue physical therapy today. IV and fluids discontinued today.

## 2012-05-21 NOTE — Progress Notes (Signed)
OT Cancellation Note  Patient Details Name: Kristy [name] MRN: [id number] DOB: Mar 17, 1946   Cancelled Treatment:    Reason Eval/Treat Not Completed: Other (comment) (Pt fatigued. Will see i am prior to D/C.)  [facility], OTR/L  [phone] 05/21/2012 05/21/2012, 6:49 PM

## 2012-05-22 ENCOUNTER — Encounter (HOSPITAL_COMMUNITY): Payer: Self-pay | Admitting: Orthopaedic Surgery

## 2012-05-22 DIAGNOSIS — D62 Acute posthemorrhagic anemia: Secondary | ICD-10-CM | POA: Diagnosis not present

## 2012-05-22 LAB — CBC
MCV: 84.9 fL (ref 78.0–100.0)
Platelets: 219 10*3/uL (ref 150–400)
RDW: 13.1 % (ref 11.5–15.5)
WBC: 10.6 10*3/uL — ABNORMAL HIGH (ref 4.0–10.5)

## 2012-05-22 MED ORDER — ASPIRIN 325 MG PO TBEC: 325.0000 mg | DELAYED_RELEASE_TABLET | Freq: Every day | ORAL | Status: DC

## 2012-05-22 MED ORDER — METHOCARBAMOL 500 MG PO TABS: 500.0000 mg | ORAL_TABLET | Freq: Four times a day (QID) | ORAL | Status: DC | PRN

## 2012-05-22 MED ORDER — OXYCODONE-ACETAMINOPHEN 5-325 MG PO TABS: ORAL_TABLET | ORAL | Status: DC

## 2012-05-22 NOTE — Assessment & Plan Note (Signed)
Stable with no complaint of diffuse pain.

## 2012-05-22 NOTE — Progress Notes (Signed)
CARE MANAGEMENT NOTE 05/22/2012  Patient:  Kristy [name], Kristy [name]   Account Number:  [id number]  Date Initiated:  05/22/2012  Documentation initiated by:  Vance Peper  Subjective/Objective Assessment:   66 yr old female s/p left total knee arthroplasty.     Action/Plan:   CM spoke with patient concerning honme healkth and DME needs at discharge. Patient states she has rolling walker, needs 3n1 and CPM.  Choice offered for [facility].   Anticipated DC Date:  05/22/2012   Anticipated DC Plan:  HOME W HOME HEALTH SERVICES      DC Planning Services  CM consult      [facility] Choice  HOME HEALTH  DURABLE MEDICAL EQUIPMENT   Choice offered to / List presented to:  C-1 Patient   DME arranged  3-N-1  CPM      DME agency  [employer]     HH arranged  HH-2 PT      HH agency  [facility]   Status of service:  Completed, signed off Medicare Important Message given?   (If response is "NO", the following Medicare IM given date fields will be blank) Date Medicare IM given:   Date Additional Medicare IM given:    Discharge Disposition:  HOME W HOME HEALTH SERVICES  Per UR Regulation:    If discussed at Long Length of Stay Meetings, dates discussed:    Comments:

## 2012-05-22 NOTE — Progress Notes (Signed)
Dressing changed to mepilex before patient discharged home. Knee immobilizer in place. Discharge instructions completed with patient and husband. All questions answered, prescriptions given to patient. Patient discharged to private vehicle with volunteer services.

## 2012-05-22 NOTE — Progress Notes (Signed)
UR completed 

## 2012-05-22 NOTE — Assessment & Plan Note (Signed)
Interval history - notable for progressive left knee pain and that she is for TKR in the next days. Limited physical exam is normal. Most recent labs reviewed - last LDL 104 in '11, no indication to repeat at this time. She is current for colorectal cancer screening and breast cancer screening. Immunizations are up to date.  In Summary - a nice woman who is medically stable and ready for surgery.

## 2012-05-22 NOTE — Assessment & Plan Note (Signed)
NO flares of gout. Lasr Uric acid leve Dec '12 = 6.1

## 2012-05-22 NOTE — Progress Notes (Signed)
Subjective: 3 Days Post-Op Procedure(s) (LRB): COMPUTER ASSISTED TOTAL KNEE ARTHROPLASTY (Left) Patient reports pain as mild.    Objective: Vital signs in last 24 hours: Temp:  [98 F (36.7 C)-98.7 F (37.1 C)] 98 F (36.7 C) (12/23 0649) Pulse Rate:  [100-107] 102  (12/23 0649) Resp:  [18] 18  (12/23 0649) BP: (122-151)/(59-90) 142/59 mmHg (12/23 0649) SpO2:  [94 %-96 %] 94 % (12/23 0649)  Intake/Output from previous day: 12/22 0701 - 12/23 0700 In: 560 [P.O.:560] Out: -  Intake/Output this shift:     Basename 05/22/12 0555 05/21/12 0520 05/20/12 0515  HGB 11.0* 11.4* 8.4*    Basename 05/22/12 0555 05/21/12 0520  WBC 10.6* 11.2*  RBC 3.83* 3.95  HCT 32.5* 33.3*  PLT 219 211    Basename 05/20/12 0515  NA 133*  K 3.7  CL 98  CO2 24  BUN 8  CREATININE 0.47*  GLUCOSE 140*  CALCIUM 8.3*   No results found for this basename: LABPT:2,INR:2 in the last 72 hours  Neurovascular intact Sensation intact distally Dorsiflexion/Plantar flexion intact Incision: no drainage  Assessment/Plan: 3 Days Post-Op Procedure(s) (LRB): COMPUTER ASSISTED TOTAL KNEE ARTHROPLASTY (Left) Discharge home with home health  Norma Ignasiak M 05/22/2012, 10:52 AM

## 2012-05-22 NOTE — Evaluation (Signed)
Occupational Therapy Evaluation Patient Details Name: Kristy [name] MRN: [id number] DOB: 01-11-1946 Today's Date: 05/22/2012 Time: 4259-5638 OT Time Calculation (min): 13 min  OT Assessment / Plan / Recommendation Clinical Impression  66 yo female s/p Lt TKA that does not need acute OT . Ot to sign off. Recommend 3n1    OT Assessment  Patient does not need any further OT services    Follow Up Recommendations  No OT follow up    Barriers to Discharge      Equipment Recommendations  3 in 1 bedside comode    Recommendations for Other Services    Frequency       Precautions / Restrictions Precautions Precautions: Knee Required Braces or Orthoses: Knee Immobilizer - Left Restrictions LLE Weight Bearing: Weight bearing as tolerated   Pertinent Vitals/Pain No pain currently s/p pain medication this AM with ice on knee    ADL  Eating/Feeding: Independent Where Assessed - Eating/Feeding: Chair Grooming: Wash/dry hands;Independent Where Assessed - Grooming: Unsupported standing Lower Body Dressing:  (plans to use reacher and husband (A)) Toilet Transfer: Community education officer Method: Sit to Barista: Raised toilet seat with arms (or 3-in-1 over toilet) Toileting - Clothing Manipulation and Hygiene: Independent Where Assessed - Toileting Clothing Manipulation and Hygiene: Sit to stand from 3-in-1 or toilet Tub/Shower Transfer: Supervision/safety Tub/Shower Transfer Method: Stand pivot Psychologist, educational: Walk in Scientist, research (physical sciences) Used: Rolling walker;Knee Immobilizer Transfers/Ambulation Related to ADLs: Pt completed transfer with increased time Mod I ADL Comments: pt plans to have husband (A) at d/c . Pt complete toilet transfer, sink level and shower transfer. All OT needs met     OT Diagnosis:    OT Problem List:   OT Treatment Interventions:     OT Goals    Visit Information  Last OT Received On: 05/22/12 Assistance Needed:  +1    Subjective Data  Subjective: "I think I will do alright if you just remind me one time. I have had to do this stuff before"- pt pleasant and eager to d/c home Patient Stated Goal: to go home today- husband present and reports when greeted hello by therapist "i'd be better if I was home so as soon as she can go the better"   Prior Functioning     Home Living Lives With: Spouse Available Help at Discharge: Family;Available 24 hours/day Type of Home: House Home Access: Ramped entrance Home Layout: One level Bathroom Shower/Tub: Walk-in shower;Door [employer] Toilet: Standard Home Adaptive Equipment: Crutches;Walker - rolling;Walker - four wheeled;Straight cane;Bedside commode/3-in-1;Walker - standard;Wheelchair - manual Prior Function Level of Independence: Independent with assistive device(s) Communication Communication: No difficulties Dominant Hand: Right         Vision/Perception     Cognition  Overall Cognitive Status: Appears within functional limits for tasks assessed/performed Arousal/Alertness: Awake/alert Orientation Level: Appears intact for tasks assessed Behavior During Session: [facility] for tasks performed    Extremity/Trunk Assessment Right Upper Extremity Assessment RUE ROM/Strength/Tone:  [facility] for tasks assessed Left Upper Extremity Assessment LUE ROM/Strength/Tone: WFL for tasks assessed Trunk Assessment Trunk Assessment: Normal     Mobility Bed Mobility Bed Mobility: Not assessed Supine to Sit: 5: Supervision Sitting - Scoot to Edge of Bed: 5: Supervision Details for Bed Mobility Assistance: Cues for postioning  Transfers Sit to Stand: 6: Modified independent (Device/Increase time);From chair/3-in-1 Stand to Sit: 6: Modified independent (Device/Increase time);To chair/3-in-1 Details for Transfer Assistance: Cues for safe hand placement and not to pull up from RW  Balance     End of Session OT - End of Session Activity Tolerance:  Patient tolerated treatment well Patient left: in chair;with call bell/phone within reach Nurse Communication: Mobility status;Precautions  GO     Lucile Shutters 05/22/2012, 11:41 AM Pager: [phone]

## 2012-05-22 NOTE — Assessment & Plan Note (Signed)
For TKR left Dec 20th

## 2012-05-22 NOTE — Discharge Summary (Signed)
Physician Discharge Summary  Patient ID: Kristy [name] MRN: [id number] DOB/AGE: 1946/05/18 66 y.o.  Admit date: 05/19/2012 Discharge date: 05/22/2012  Admission Diagnoses:  Osteoarthritis of left knee  Discharge Diagnoses:  Principal Problem:  *Osteoarthritis of left knee Active Problems:  Acute blood loss anemia   Past Medical History  Diagnosis Date  . Asthma   . Hypertension   . Hiatal hernia   . DUB (dysfunctional uterine bleeding)   . SUI (stress urinary incontinence, female)   . Atrophic vaginitis   . Fibroid   . Fibromyalgia   . Reflux   . Shortness of breath     occ  . GERD (gastroesophageal reflux disease)   . Arthritis     generalized joint pain, including back    Surgeries: Procedure(s): COMPUTER ASSISTED TOTAL KNEE ARTHROPLASTY on 05/19/2012   Consultants (if any):  none  Discharged Condition: Improved  Hospital Course: Kristy [name] is an 66 y.o. female who was admitted 05/19/2012 with a diagnosis of Osteoarthritis of left knee and went to the operating room on 05/19/2012 and underwent the above named procedures.    She was given perioperative antibiotics:      Anti-infectives     Start     Dose/Rate Route Frequency Ordered Stop   05/19/12 1645   ceFAZolin (ANCEF) IVPB 1 g/50 mL premix        1 g 100 mL/hr over 30 Minutes Intravenous Every 6 hours 05/19/12 1630 05/19/12 2308   05/18/12 1428   ceFAZolin (ANCEF) IVPB 2 g/50 mL premix        2 g 100 mL/hr over 30 Minutes Intravenous 60 min pre-op 05/18/12 1428 05/19/12 1059        .  She was given sequential compression devices, early ambulation, and aspirin for DVT prophylaxis.  She benefited maximally from the hospital stay and there were no complications.    Recent vital signs:  Filed Vitals:   05/22/12 0649  BP: 142/59  Pulse: 102  Temp: 98 F (36.7 C)  Resp: 18    Recent laboratory studies:  Lab Results  Component Value Date   HGB 11.0* 05/22/2012   HGB 11.4* 05/21/2012    HGB 8.4* 05/20/2012   Lab Results  Component Value Date   WBC 10.6* 05/22/2012   PLT 219 05/22/2012   No results found for this basename: INR   Lab Results  Component Value Date   NA 133* 05/20/2012   K 3.7 05/20/2012   CL 98 05/20/2012   CO2 24 05/20/2012   BUN 8 05/20/2012   CREATININE 0.47* 05/20/2012   GLUCOSE 140* 05/20/2012    Discharge Medications:     Medication List     As of 05/22/2012 11:00 AM    TAKE these medications         aspirin 325 MG EC tablet   Take 1 tablet (325 mg total) by mouth daily with breakfast.      ASTEPRO 0.15 % Soln   Generic drug: Azelastine HCl   Place 2 sprays into the nose daily.      beclomethasone 80 MCG/ACT inhaler   Commonly known as: QVAR   Inhale 1 puff into the lungs 2 (two) times daily.      conjugated estrogens vaginal cream   Commonly known as: PREMARIN   0.5 gram in vagina three times a week      diltiazem 240 MG 24 hr capsule   Commonly known as: CARDIZEM CD   Take 1  capsule (240 mg total) by mouth daily.      EPIPEN 0.3 mg/0.3 mL Devi   Generic drug: EPINEPHrine   Inject 0.3 mg into the muscle as needed. For allergic reactions      furosemide 40 MG tablet   Commonly known as: LASIX   TAKE 1 TABLET (40 MG TOTAL) BY MOUTH DAILY.      levalbuterol 45 MCG/ACT inhaler   Commonly known as: XOPENEX HFA   Inhale 2 puffs into the lungs every 4 (four) hours as needed. For shortness of breath      levocetirizine 5 MG tablet   Commonly known as: XYZAL   Take 5 mg by mouth daily.      losartan 50 MG tablet   Commonly known as: COZAAR   Take 50 mg by mouth daily.      methocarbamol 500 MG tablet   Commonly known as: ROBAXIN   Take 1 tablet (500 mg total) by mouth every 6 (six) hours as needed.      methocarbamol 500 MG tablet   Commonly known as: ROBAXIN   Take 1 tablet (500 mg total) by mouth every 6 (six) hours as needed.      nortriptyline 25 MG capsule   Commonly known as: PAMELOR   Take 25 mg by  mouth at bedtime.      omeprazole 40 MG capsule   Commonly known as: PRILOSEC   Take 40 mg by mouth daily.      oxyCODONE-acetaminophen 5-325 MG per tablet   Commonly known as: PERCOCET/ROXICET   1-2 q4-6 hr prn pain      oxyCODONE-acetaminophen 5-325 MG per tablet   Commonly known as: PERCOCET/ROXICET   1-2 q4-6 hrs prn pain      PATADAY 0.2 % Soln   Generic drug: Olopatadine HCl   Place 1 drop into both eyes daily.         Diagnostic Studies: Dg Chest 2 View  05/10/2012  *RADIOLOGY REPORT*  Clinical Data: Preop for knee replacement, history as  CHEST - 2 VIEW  Comparison: Chest x-ray of 03/14/2006  Findings: No active infiltrate or effusion is seen.  The heart is within normal limits in size.  There is a hiatal hernia noted creating a retrocardiac opacity.  There are degenerative changes in the lower thoracic spine.  IMPRESSION: Stable chest x-ray.  No active lung disease.  Hiatal hernia.   Original Report Authenticated By: Dwyane Dee, M.D.     Disposition: to home   Keep knee incision dry for 5 days post op then may wet while bathing. Therapy daily and  goal full extension and greater than 90 degrees flexion. Call if fever or chills or increased drainage. Go to ER if acutely short of breath or call for ambulance. Return for follow up in 2 weeks. May full weight bear on the surgical leg unless told otherwise. Use knee immobilizer until able to straight leg raise off bed with knee stable. In house walking for first 2 weeks.  TEDS at home for 4 weeks.  Remove at night.  Aspirin for DVT prophylaxis.   Follow-up Information    Follow up with Eldred Manges, MD. Schedule an appointment as soon as possible for a visit in 2 weeks.   Contact information:   [address] [place] [address] [place] [address] [phone]           Signed: Wende Neighbors 05/22/2012, 11:00 AM

## 2012-05-22 NOTE — Progress Notes (Signed)
Physical Therapy Treatment Patient Details Name: Kristy [name] MRN: [id number] DOB: June 22, 1945 Today's Date: 05/22/2012 Time: 0835-0900 PT Time Calculation (min): 25 min  PT Assessment / Plan / Recommendation Comments on Treatment Session  Patient ambulating better today. No complaints of nausea. patient stating she is planning on discharging home with her husband today    Follow Up Recommendations  Home health PT     Does the patient have the potential to tolerate intense rehabilitation     Barriers to Discharge        Equipment Recommendations  None recommended by PT    Recommendations for Other Services    Frequency 7X/week   Plan Discharge plan remains appropriate;Frequency remains appropriate    Precautions / Restrictions Precautions Required Braces or Orthoses: Knee Immobilizer - Left Restrictions LLE Weight Bearing: Weight bearing as tolerated   Pertinent Vitals/Pain     Mobility  Bed Mobility Supine to Sit: 5: Supervision Sitting - Scoot to Edge of Bed: 5: Supervision Details for Bed Mobility Assistance: Cues for postioning  Transfers Sit to Stand: 5: Supervision;With upper extremity assist;From toilet;From bed Stand to Sit: 5: Supervision;With upper extremity assist;To toilet;To chair/3-in-1 Details for Transfer Assistance: Cues for safe hand placement and not to pull up from RW Ambulation/Gait Ambulation/Gait Assistance: 5: Supervision Ambulation Distance (Feet): 150 Feet Assistive device: Rolling walker Ambulation/Gait Assistance Details: Cues for posture.  Gait Pattern: Step-to pattern    Exercises Total Joint Exercises Quad Sets: AROM;Left;10 reps Heel Slides: AAROM;Left;10 reps Hip ABduction/ADduction: AAROM;Left;10 reps Straight Leg Raises: AAROM;Left;10 reps   PT Diagnosis:    PT Problem List:   PT Treatment Interventions:     PT Goals Acute Rehab PT Goals PT Goal: Supine/Side to Sit - Progress: Met PT Goal: Sit to Stand - Progress:  Met PT Goal: Ambulate - Progress: Met PT Goal: Perform Home Exercise Program - Progress: Progressing toward goal  Visit Information  Last PT Received On: 05/22/12 Assistance Needed: +1    Subjective Data      Cognition  Overall Cognitive Status: Appears within functional limits for tasks assessed/performed Arousal/Alertness: Awake/alert Orientation Level: Appears intact for tasks assessed Behavior During Session: [facility] for tasks performed    Balance     End of Session PT - End of Session Equipment Utilized During Treatment: Gait belt Activity Tolerance: Patient tolerated treatment well Patient left: in chair;with call bell/phone within reach Nurse Communication: Mobility status   GP     Fredrich Birks 05/22/2012, 10:30 AM 05/22/2012 Fredrich Birks PTA [phone] pager [phone] office

## 2012-05-22 NOTE — Assessment & Plan Note (Signed)
BP Readings from Last 3 Encounters:  05/22/12 142/59  05/22/12 142/59  05/18/12 130/78   Adequate control. Will reassess after TKR when she is having less knee pain.

## 2012-05-23 DIAGNOSIS — J45909 Unspecified asthma, uncomplicated: Secondary | ICD-10-CM | POA: Diagnosis not present

## 2012-05-23 DIAGNOSIS — IMO0001 Reserved for inherently not codable concepts without codable children: Secondary | ICD-10-CM | POA: Diagnosis not present

## 2012-05-23 DIAGNOSIS — I10 Essential (primary) hypertension: Secondary | ICD-10-CM | POA: Diagnosis not present

## 2012-05-23 DIAGNOSIS — Z471 Aftercare following joint replacement surgery: Secondary | ICD-10-CM | POA: Diagnosis not present

## 2012-05-23 DIAGNOSIS — M159 Polyosteoarthritis, unspecified: Secondary | ICD-10-CM | POA: Diagnosis not present

## 2012-05-23 DIAGNOSIS — Z96659 Presence of unspecified artificial knee joint: Secondary | ICD-10-CM | POA: Diagnosis not present

## 2012-05-25 ENCOUNTER — Encounter: Payer: Medicare Other | Admitting: Obstetrics and Gynecology

## 2012-05-25 DIAGNOSIS — IMO0001 Reserved for inherently not codable concepts without codable children: Secondary | ICD-10-CM | POA: Diagnosis not present

## 2012-05-25 DIAGNOSIS — J45909 Unspecified asthma, uncomplicated: Secondary | ICD-10-CM | POA: Diagnosis not present

## 2012-05-25 DIAGNOSIS — Z96659 Presence of unspecified artificial knee joint: Secondary | ICD-10-CM | POA: Diagnosis not present

## 2012-05-25 DIAGNOSIS — Z471 Aftercare following joint replacement surgery: Secondary | ICD-10-CM | POA: Diagnosis not present

## 2012-05-25 DIAGNOSIS — I10 Essential (primary) hypertension: Secondary | ICD-10-CM | POA: Diagnosis not present

## 2012-05-25 DIAGNOSIS — M159 Polyosteoarthritis, unspecified: Secondary | ICD-10-CM | POA: Diagnosis not present

## 2012-05-26 DIAGNOSIS — Z96659 Presence of unspecified artificial knee joint: Secondary | ICD-10-CM | POA: Diagnosis not present

## 2012-05-26 DIAGNOSIS — Z471 Aftercare following joint replacement surgery: Secondary | ICD-10-CM | POA: Diagnosis not present

## 2012-05-26 DIAGNOSIS — I10 Essential (primary) hypertension: Secondary | ICD-10-CM | POA: Diagnosis not present

## 2012-05-26 DIAGNOSIS — IMO0001 Reserved for inherently not codable concepts without codable children: Secondary | ICD-10-CM | POA: Diagnosis not present

## 2012-05-26 DIAGNOSIS — J45909 Unspecified asthma, uncomplicated: Secondary | ICD-10-CM | POA: Diagnosis not present

## 2012-05-26 DIAGNOSIS — M159 Polyosteoarthritis, unspecified: Secondary | ICD-10-CM | POA: Diagnosis not present

## 2012-05-28 ENCOUNTER — Other Ambulatory Visit: Payer: Self-pay | Admitting: Internal Medicine

## 2012-05-29 DIAGNOSIS — J45909 Unspecified asthma, uncomplicated: Secondary | ICD-10-CM | POA: Diagnosis not present

## 2012-05-29 DIAGNOSIS — I10 Essential (primary) hypertension: Secondary | ICD-10-CM | POA: Diagnosis not present

## 2012-05-29 DIAGNOSIS — Z471 Aftercare following joint replacement surgery: Secondary | ICD-10-CM | POA: Diagnosis not present

## 2012-05-29 DIAGNOSIS — IMO0001 Reserved for inherently not codable concepts without codable children: Secondary | ICD-10-CM | POA: Diagnosis not present

## 2012-05-29 DIAGNOSIS — M159 Polyosteoarthritis, unspecified: Secondary | ICD-10-CM | POA: Diagnosis not present

## 2012-05-29 DIAGNOSIS — Z96659 Presence of unspecified artificial knee joint: Secondary | ICD-10-CM | POA: Diagnosis not present

## 2012-05-31 DIAGNOSIS — I10 Essential (primary) hypertension: Secondary | ICD-10-CM | POA: Diagnosis not present

## 2012-05-31 DIAGNOSIS — IMO0001 Reserved for inherently not codable concepts without codable children: Secondary | ICD-10-CM | POA: Diagnosis not present

## 2012-05-31 DIAGNOSIS — Z96659 Presence of unspecified artificial knee joint: Secondary | ICD-10-CM | POA: Diagnosis not present

## 2012-05-31 DIAGNOSIS — J45909 Unspecified asthma, uncomplicated: Secondary | ICD-10-CM | POA: Diagnosis not present

## 2012-05-31 DIAGNOSIS — M159 Polyosteoarthritis, unspecified: Secondary | ICD-10-CM | POA: Diagnosis not present

## 2012-05-31 DIAGNOSIS — Z471 Aftercare following joint replacement surgery: Secondary | ICD-10-CM | POA: Diagnosis not present

## 2012-06-02 DIAGNOSIS — Z96659 Presence of unspecified artificial knee joint: Secondary | ICD-10-CM | POA: Diagnosis not present

## 2012-06-02 DIAGNOSIS — Z471 Aftercare following joint replacement surgery: Secondary | ICD-10-CM | POA: Diagnosis not present

## 2012-06-02 DIAGNOSIS — J45909 Unspecified asthma, uncomplicated: Secondary | ICD-10-CM | POA: Diagnosis not present

## 2012-06-02 DIAGNOSIS — IMO0001 Reserved for inherently not codable concepts without codable children: Secondary | ICD-10-CM | POA: Diagnosis not present

## 2012-06-02 DIAGNOSIS — M159 Polyosteoarthritis, unspecified: Secondary | ICD-10-CM | POA: Diagnosis not present

## 2012-06-02 DIAGNOSIS — I10 Essential (primary) hypertension: Secondary | ICD-10-CM | POA: Diagnosis not present

## 2012-06-05 DIAGNOSIS — Z96659 Presence of unspecified artificial knee joint: Secondary | ICD-10-CM | POA: Diagnosis not present

## 2012-06-05 DIAGNOSIS — I10 Essential (primary) hypertension: Secondary | ICD-10-CM | POA: Diagnosis not present

## 2012-06-05 DIAGNOSIS — Z471 Aftercare following joint replacement surgery: Secondary | ICD-10-CM | POA: Diagnosis not present

## 2012-06-05 DIAGNOSIS — M159 Polyosteoarthritis, unspecified: Secondary | ICD-10-CM | POA: Diagnosis not present

## 2012-06-05 DIAGNOSIS — J45909 Unspecified asthma, uncomplicated: Secondary | ICD-10-CM | POA: Diagnosis not present

## 2012-06-05 DIAGNOSIS — IMO0001 Reserved for inherently not codable concepts without codable children: Secondary | ICD-10-CM | POA: Diagnosis not present

## 2012-06-06 DIAGNOSIS — Z96659 Presence of unspecified artificial knee joint: Secondary | ICD-10-CM | POA: Diagnosis not present

## 2012-06-06 DIAGNOSIS — M1710 Unilateral primary osteoarthritis, unspecified knee: Secondary | ICD-10-CM | POA: Diagnosis not present

## 2012-06-07 DIAGNOSIS — J45909 Unspecified asthma, uncomplicated: Secondary | ICD-10-CM | POA: Diagnosis not present

## 2012-06-07 DIAGNOSIS — I10 Essential (primary) hypertension: Secondary | ICD-10-CM | POA: Diagnosis not present

## 2012-06-07 DIAGNOSIS — M159 Polyosteoarthritis, unspecified: Secondary | ICD-10-CM | POA: Diagnosis not present

## 2012-06-07 DIAGNOSIS — IMO0001 Reserved for inherently not codable concepts without codable children: Secondary | ICD-10-CM | POA: Diagnosis not present

## 2012-06-07 DIAGNOSIS — Z471 Aftercare following joint replacement surgery: Secondary | ICD-10-CM | POA: Diagnosis not present

## 2012-06-07 DIAGNOSIS — Z96659 Presence of unspecified artificial knee joint: Secondary | ICD-10-CM | POA: Diagnosis not present

## 2012-06-09 DIAGNOSIS — Z471 Aftercare following joint replacement surgery: Secondary | ICD-10-CM | POA: Diagnosis not present

## 2012-06-09 DIAGNOSIS — IMO0001 Reserved for inherently not codable concepts without codable children: Secondary | ICD-10-CM | POA: Diagnosis not present

## 2012-06-09 DIAGNOSIS — J45909 Unspecified asthma, uncomplicated: Secondary | ICD-10-CM | POA: Diagnosis not present

## 2012-06-09 DIAGNOSIS — Z96659 Presence of unspecified artificial knee joint: Secondary | ICD-10-CM | POA: Diagnosis not present

## 2012-06-09 DIAGNOSIS — M159 Polyosteoarthritis, unspecified: Secondary | ICD-10-CM | POA: Diagnosis not present

## 2012-06-09 DIAGNOSIS — I10 Essential (primary) hypertension: Secondary | ICD-10-CM | POA: Diagnosis not present

## 2012-06-14 DIAGNOSIS — M1710 Unilateral primary osteoarthritis, unspecified knee: Secondary | ICD-10-CM | POA: Diagnosis not present

## 2012-06-14 DIAGNOSIS — Z96659 Presence of unspecified artificial knee joint: Secondary | ICD-10-CM | POA: Diagnosis not present

## 2012-06-14 DIAGNOSIS — M25569 Pain in unspecified knee: Secondary | ICD-10-CM | POA: Diagnosis not present

## 2012-06-16 DIAGNOSIS — M1710 Unilateral primary osteoarthritis, unspecified knee: Secondary | ICD-10-CM | POA: Diagnosis not present

## 2012-06-16 DIAGNOSIS — M25569 Pain in unspecified knee: Secondary | ICD-10-CM | POA: Diagnosis not present

## 2012-06-16 DIAGNOSIS — Z96659 Presence of unspecified artificial knee joint: Secondary | ICD-10-CM | POA: Diagnosis not present

## 2012-06-21 DIAGNOSIS — J309 Allergic rhinitis, unspecified: Secondary | ICD-10-CM | POA: Diagnosis not present

## 2012-06-21 DIAGNOSIS — M1710 Unilateral primary osteoarthritis, unspecified knee: Secondary | ICD-10-CM | POA: Diagnosis not present

## 2012-06-21 DIAGNOSIS — M25569 Pain in unspecified knee: Secondary | ICD-10-CM | POA: Diagnosis not present

## 2012-06-21 DIAGNOSIS — Z96659 Presence of unspecified artificial knee joint: Secondary | ICD-10-CM | POA: Diagnosis not present

## 2012-06-23 DIAGNOSIS — Z96659 Presence of unspecified artificial knee joint: Secondary | ICD-10-CM | POA: Diagnosis not present

## 2012-06-23 DIAGNOSIS — M1710 Unilateral primary osteoarthritis, unspecified knee: Secondary | ICD-10-CM | POA: Diagnosis not present

## 2012-06-23 DIAGNOSIS — M25569 Pain in unspecified knee: Secondary | ICD-10-CM | POA: Diagnosis not present

## 2012-06-26 DIAGNOSIS — M25569 Pain in unspecified knee: Secondary | ICD-10-CM | POA: Diagnosis not present

## 2012-06-26 DIAGNOSIS — Z96659 Presence of unspecified artificial knee joint: Secondary | ICD-10-CM | POA: Diagnosis not present

## 2012-06-26 DIAGNOSIS — M1710 Unilateral primary osteoarthritis, unspecified knee: Secondary | ICD-10-CM | POA: Diagnosis not present

## 2012-06-28 DIAGNOSIS — Z96659 Presence of unspecified artificial knee joint: Secondary | ICD-10-CM | POA: Diagnosis not present

## 2012-06-28 DIAGNOSIS — M1710 Unilateral primary osteoarthritis, unspecified knee: Secondary | ICD-10-CM | POA: Diagnosis not present

## 2012-06-28 DIAGNOSIS — M25569 Pain in unspecified knee: Secondary | ICD-10-CM | POA: Diagnosis not present

## 2012-06-29 DIAGNOSIS — J309 Allergic rhinitis, unspecified: Secondary | ICD-10-CM | POA: Diagnosis not present

## 2012-06-30 DIAGNOSIS — M1710 Unilateral primary osteoarthritis, unspecified knee: Secondary | ICD-10-CM | POA: Diagnosis not present

## 2012-06-30 DIAGNOSIS — Z96659 Presence of unspecified artificial knee joint: Secondary | ICD-10-CM | POA: Diagnosis not present

## 2012-06-30 DIAGNOSIS — M25569 Pain in unspecified knee: Secondary | ICD-10-CM | POA: Diagnosis not present

## 2012-07-03 DIAGNOSIS — Z96659 Presence of unspecified artificial knee joint: Secondary | ICD-10-CM | POA: Diagnosis not present

## 2012-07-03 DIAGNOSIS — M25569 Pain in unspecified knee: Secondary | ICD-10-CM | POA: Diagnosis not present

## 2012-07-03 DIAGNOSIS — M1710 Unilateral primary osteoarthritis, unspecified knee: Secondary | ICD-10-CM | POA: Diagnosis not present

## 2012-07-05 DIAGNOSIS — M1710 Unilateral primary osteoarthritis, unspecified knee: Secondary | ICD-10-CM | POA: Diagnosis not present

## 2012-07-05 DIAGNOSIS — M25569 Pain in unspecified knee: Secondary | ICD-10-CM | POA: Diagnosis not present

## 2012-07-05 DIAGNOSIS — Z96659 Presence of unspecified artificial knee joint: Secondary | ICD-10-CM | POA: Diagnosis not present

## 2012-07-06 DIAGNOSIS — J3089 Other allergic rhinitis: Secondary | ICD-10-CM | POA: Diagnosis not present

## 2012-07-06 DIAGNOSIS — J301 Allergic rhinitis due to pollen: Secondary | ICD-10-CM | POA: Diagnosis not present

## 2012-07-06 DIAGNOSIS — J309 Allergic rhinitis, unspecified: Secondary | ICD-10-CM | POA: Diagnosis not present

## 2012-07-06 DIAGNOSIS — J3081 Allergic rhinitis due to animal (cat) (dog) hair and dander: Secondary | ICD-10-CM | POA: Diagnosis not present

## 2012-07-06 DIAGNOSIS — H1045 Other chronic allergic conjunctivitis: Secondary | ICD-10-CM | POA: Diagnosis not present

## 2012-07-07 DIAGNOSIS — Z96659 Presence of unspecified artificial knee joint: Secondary | ICD-10-CM | POA: Diagnosis not present

## 2012-07-07 DIAGNOSIS — M25569 Pain in unspecified knee: Secondary | ICD-10-CM | POA: Diagnosis not present

## 2012-07-07 DIAGNOSIS — M1710 Unilateral primary osteoarthritis, unspecified knee: Secondary | ICD-10-CM | POA: Diagnosis not present

## 2012-07-10 DIAGNOSIS — Z96659 Presence of unspecified artificial knee joint: Secondary | ICD-10-CM | POA: Diagnosis not present

## 2012-07-10 DIAGNOSIS — M1710 Unilateral primary osteoarthritis, unspecified knee: Secondary | ICD-10-CM | POA: Diagnosis not present

## 2012-07-10 DIAGNOSIS — M25569 Pain in unspecified knee: Secondary | ICD-10-CM | POA: Diagnosis not present

## 2012-07-12 DIAGNOSIS — J309 Allergic rhinitis, unspecified: Secondary | ICD-10-CM | POA: Diagnosis not present

## 2012-07-14 ENCOUNTER — Emergency Department (HOSPITAL_COMMUNITY): Payer: Medicare Other

## 2012-07-14 ENCOUNTER — Observation Stay (HOSPITAL_COMMUNITY)
Admission: EM | Admit: 2012-07-14 | Discharge: 2012-07-16 | Disposition: A | Discharge status: Home / Self Care | Payer: Medicare Other | Attending: Internal Medicine | Admitting: Internal Medicine

## 2012-07-14 ENCOUNTER — Encounter (HOSPITAL_COMMUNITY): Payer: Self-pay | Admitting: Emergency Medicine

## 2012-07-14 DIAGNOSIS — I10 Essential (primary) hypertension: Secondary | ICD-10-CM | POA: Insufficient documentation

## 2012-07-14 DIAGNOSIS — R51 Headache: Secondary | ICD-10-CM | POA: Insufficient documentation

## 2012-07-14 DIAGNOSIS — Z8673 Personal history of transient ischemic attack (TIA), and cerebral infarction without residual deficits: Secondary | ICD-10-CM | POA: Diagnosis present

## 2012-07-14 DIAGNOSIS — D72829 Elevated white blood cell count, unspecified: Secondary | ICD-10-CM | POA: Insufficient documentation

## 2012-07-14 DIAGNOSIS — G459 Transient cerebral ischemic attack, unspecified: Principal | ICD-10-CM | POA: Insufficient documentation

## 2012-07-14 DIAGNOSIS — Z79899 Other long term (current) drug therapy: Secondary | ICD-10-CM | POA: Insufficient documentation

## 2012-07-14 DIAGNOSIS — R4789 Other speech disturbances: Secondary | ICD-10-CM | POA: Diagnosis not present

## 2012-07-14 DIAGNOSIS — R29898 Other symptoms and signs involving the musculoskeletal system: Secondary | ICD-10-CM | POA: Diagnosis not present

## 2012-07-14 DIAGNOSIS — R209 Unspecified disturbances of skin sensation: Secondary | ICD-10-CM | POA: Insufficient documentation

## 2012-07-14 LAB — DIFFERENTIAL
Eosinophils Absolute: 0.2 10*3/uL (ref 0.0–0.7)
Eosinophils Relative: 2 % (ref 0–5)
Lymphocytes Relative: 40 % (ref 12–46)
Lymphs Abs: 4.5 10*3/uL — ABNORMAL HIGH (ref 0.7–4.0)
Monocytes Relative: 8 % (ref 3–12)

## 2012-07-14 LAB — CBC
Hemoglobin: 14.9 g/dL (ref 12.0–15.0)
MCH: 28.5 pg (ref 26.0–34.0)
MCV: 82.6 fL (ref 78.0–100.0)
RBC: 5.22 MIL/uL — ABNORMAL HIGH (ref 3.87–5.11)
WBC: 11.3 10*3/uL — ABNORMAL HIGH (ref 4.0–10.5)

## 2012-07-14 LAB — COMPREHENSIVE METABOLIC PANEL
ALT: 22 U/L (ref 0–35)
Alkaline Phosphatase: 100 U/L (ref 39–117)
BUN: 14 mg/dL (ref 6–23)
CO2: 24 mEq/L (ref 19–32)
Calcium: 9.4 mg/dL (ref 8.4–10.5)
GFR calc Af Amer: >90 mL/min (ref >90)
GFR calc non Af Amer: >90 mL/min (ref >90)
Glucose, Bld: 164 mg/dL — ABNORMAL HIGH (ref 70–99)
Potassium: 3.5 mEq/L (ref 3.5–5.1)
Sodium: 139 mEq/L (ref 135–145)
Total Protein: 7.7 g/dL (ref 6.0–8.3)

## 2012-07-14 LAB — POCT I-STAT TROPONIN I: Troponin i, poc: 0 ng/mL (ref 0.00–0.08)

## 2012-07-14 LAB — ETHANOL: Alcohol, Ethyl (B): <11 mg/dL (ref 0–11)

## 2012-07-14 LAB — POCT I-STAT, CHEM 8
HCT: 45 % (ref 36.0–46.0)
Hemoglobin: 15.3 g/dL — ABNORMAL HIGH (ref 12.0–15.0)
Sodium: 140 mEq/L (ref 135–145)
TCO2: 24 mmol/L (ref 0–100)

## 2012-07-14 LAB — TROPONIN I: Troponin I: <0.3 ng/mL (ref <0.30)

## 2012-07-14 LAB — GLUCOSE, CAPILLARY: Glucose-Capillary: 158 mg/dL — ABNORMAL HIGH (ref 70–99)

## 2012-07-14 NOTE — ED Notes (Signed)
C/o sudden onset of L sided headache and numbness to L side of face that started at 2100.  Reports slurred speech after pain started.  States pain is better now but is still having intermittent L sided headache and reports that she is still having L sided facial numbness.  Speech clear and no neuro deficits noted on triage exam.  No facial droop, arm drift or weakness noted on exam.

## 2012-07-14 NOTE — ED Notes (Signed)
Pt arrival in CT

## 2012-07-14 NOTE — ED Notes (Addendum)
Code Stroke Called

## 2012-07-14 NOTE — Consult Note (Signed)
Referring Physician: ED    Chief Complaint: left occipital headache, left face numbness, slurred speech. Code stroke.  HPI:                                                                                                                                         Kristy [name] is an 67 y.o. female with a past medical history significant for hypertension, asthma, GERD, left knee replacement, who was in her usual state of health until 9 pm today when she developed acute onset left occipital pain that was rapidly followed by a " numb sensation" in the left side of the face and slurred speech. She indicated that the speech difficulty lasted for approximately 10 minutes and then completely resolved. Stated that the left side of her face is still numb but there is no numbness or tingling of the left arm and leg. No vertigo, double vision, difficulty swallowing, focal weakness, or altered mental status. NIHSS 0.  CT brain without contrast showed no acute intracranial abnormality.  LSN: 9 pm tonight. tPA Given: no, due to NIHSS 0  Past Medical History  Diagnosis Date  . Asthma   . Hypertension   . Hiatal hernia   . DUB (dysfunctional uterine bleeding)   . SUI (stress urinary incontinence, female)   . Atrophic vaginitis   . Fibroid   . Fibromyalgia   . Reflux   . Shortness of breath     occ  . GERD (gastroesophageal reflux disease)   . Arthritis     generalized joint pain, including back    Past Surgical History  Procedure Laterality Date  . Abdominal hysterectomy  1994    TAH,BSO-BLADDER NECK SUSPENSION  . Surgery for poserior tibial tendon  2003  . Cystocele repair  1994  . Cholecystectomy, laparoscopic  2001  . Tubal ligation    . Carpal tunnel release  2005  . Triple fusion left ankle  2007  . Oophorectomy      BSO  . Breast surgery      LUMPECTOMY-BENIGN Breast Bx X2  . Tonsillectomy    . Knee arthroscopy      bil  . Knee arthroplasty  05/19/2012    Procedure: COMPUTER  ASSISTED TOTAL KNEE ARTHROPLASTY;  Surgeon: Eldred Manges, MD;  Location: [facility] OR;  Service: Orthopedics;  Laterality: Left;  Left  Total Knee Arthroplasty    Family History  Problem Relation Age of Onset  . Breast cancer Mother   . Breast cancer Sister   . Hypertension Sister   . Cancer Father     Brain tumor  . Cancer Maternal Aunt   . Cancer Maternal Uncle   . Hemochromatosis Sister   . Thalassemia Sister    Social History:  reports that she has never smoked. She has never used smokeless tobacco. She reports that  drinks alcohol. She reports that she does not  use illicit drugs.  Allergies:  Allergies  Allergen Reactions  . Iodine Anaphylaxis  . Shellfish Allergy Anaphylaxis  . Sulfonamide Derivatives Rash  . Other Swelling    Citrus  Sometimes Lactose intolerant  . Amoxicillin Diarrhea    Medications:                                                                                                                           I have reviewed the patient's current medications.  ROS:                                                                                                                                       History obtained from the patient  General ROS: negative for - chills, fatigue, fever, night sweats, weight gain or weight loss Psychological ROS: negative for - behavioral disorder, hallucinations, memory difficulties, mood swings or suicidal ideation Ophthalmic ROS: negative for - blurry vision, double vision, eye pain or loss of vision ENT ROS: negative for - epistaxis, nasal discharge, oral lesions, sore throat, tinnitus or vertigo Allergy and Immunology ROS: negative for - hives or itchy/watery eyes Hematological and Lymphatic ROS: negative for - bleeding problems, bruising or swollen lymph nodes Endocrine ROS: negative for - galactorrhea, hair pattern changes, polydipsia/polyuria or temperature intolerance Respiratory ROS: negative for - cough, hemoptysis,  shortness of breath or wheezing Cardiovascular ROS: negative for - chest pain, dyspnea on exertion, edema or irregular heartbeat Gastrointestinal ROS: negative for - abdominal pain, diarrhea, hematemesis, nausea/vomiting or stool incontinence Genito-Urinary ROS: negative for - dysuria, hematuria, incontinence or urinary frequency/urgency Musculoskeletal ROS: negative for - joint swelling or muscular weakness Neurological ROS: as noted in HPI Dermatological ROS: negative for rash and skin lesion changes.  Physical exam" pleasant, no distress. Blood pressure 152/84, pulse 110, temperature 99 F (37.2 C), temperature source Oral, resp. rate 18, SpO2 98.00%. Head: normocephalic. Neck: supple, no bruits or JVD. Cardiac: no murmurs. Lungs: clear. Abdomen: soft. Extremities: symmetrical without edema.  Neurologic Examination:  Mental Status: Alert, awake, oriented x 4, thought content appropriate, comprehension, naming, and repetition normal.  Speech fluent without evidence of aphasia.  Cranial Nerves: II: Discs flat bilaterally; Visual fields grossly normal, pupils equal, round, reactive to light and accommodation III,IV, VI: ptosis not present, extra-ocular motions intact bilaterally V,VII: smile symmetric, facial light touch sensation normal bilaterally VIII: hearing normal bilaterally IX,X: gag reflex present XI: bilateral shoulder shrug XII: midline tongue extension Motor: Right : Upper extremity   5/5    Left:     Upper extremity   5/5  Lower extremity   5/5     Lower extremity   5/5 Tone and bulk:normal tone throughout; no atrophy noted Sensory: Pinprick and light touch intact throughout, bilaterally Deep Tendon Reflexes: 2+ and symmetric throughout Plantars: Right: downgoing   Left: downgoing Cerebellar: normal finger-to-nose,  normal heel-to-shin test Gait: no ataxia. CV:  pulses palpable throughout    Results for orders placed during the hospital encounter of 07/14/12 (from the past 48 hour(s))  CBC     Status: Abnormal   Collection Time    07/14/12 10:08 PM      Result Value Range   WBC 11.3 (*) 4.0 - 10.5 K/uL   RBC 5.22 (*) 3.87 - 5.11 MIL/uL   Hemoglobin 14.9  12.0 - 15.0 g/dL   HCT 40.9  81.1 - 91.4 %   MCV 82.6  78.0 - 100.0 fL   MCH 28.5  26.0 - 34.0 pg   MCHC 34.6  30.0 - 36.0 g/dL   RDW 78.2  95.6 - 21.3 %   Platelets 279  150 - 400 K/uL  DIFFERENTIAL     Status: Abnormal   Collection Time    07/14/12 10:08 PM      Result Value Range   Neutrophils Relative 50  43 - 77 %   Neutro Abs 5.7  1.7 - 7.7 K/uL   Lymphocytes Relative 40  12 - 46 %   Lymphs Abs 4.5 (*) 0.7 - 4.0 K/uL   Monocytes Relative 8  3 - 12 %   Monocytes Absolute 0.9  0.1 - 1.0 K/uL   Eosinophils Relative 2  0 - 5 %   Eosinophils Absolute 0.2  0.0 - 0.7 K/uL   Basophils Relative 1  0 - 1 %   Basophils Absolute 0.1  0.0 - 0.1 K/uL  POCT I-STAT, CHEM 8     Status: Abnormal   Collection Time    07/14/12 10:25 PM      Result Value Range   Sodium 140  135 - 145 mEq/L   Potassium 3.4 (*) 3.5 - 5.1 mEq/L   Chloride 105  96 - 112 mEq/L   BUN 15  6 - 23 mg/dL   Creatinine, Ser 0.86  0.50 - 1.10 mg/dL   Glucose, Bld 578 (*) 70 - 99 mg/dL   Calcium, Ion 4.69  6.29 - 1.30 mmol/L   TCO2 24  0 - 100 mmol/L   Hemoglobin 15.3 (*) 12.0 - 15.0 g/dL   HCT 52.8  41.3 - 24.4 %  POCT I-STAT TROPONIN I     Status: None   Collection Time    07/14/12 10:25 PM      Result Value Range   Troponin i, poc 0.00  0.00 - 0.08 ng/mL   Comment 3            Comment: Due to the release kinetics of cTnI,     a negative result within  the first hours     of the onset of symptoms does not rule out     myocardial infarction with certainty.     If myocardial infarction is still suspected,     repeat the test at appropriate intervals.   No results found.   [facility] [phone]  07/14/2012, 10:40 PM   Assessment: 67 y.o. female with hypertension and acute onset left occipital headache, left face numbness, and slurred speech. No frank neurological abnormalities on exam, and her NIHSS is 0 at this moment. However, she has persistent numbness restricted to the left face.  Differential includes TIA versus small lacunar infarct right thalamus. Suggest aspirin 81 mg and statins if no contraindications. Complete TIA work up. Our stroke team will see patient in the morning and make further recommendations accordingly.   Wyatt Portela, MD [facility] [phone]  07/14/2012, 10:40 PM

## 2012-07-14 NOTE — ED Notes (Signed)
Stroke team arrival

## 2012-07-14 NOTE — ED Notes (Signed)
Phlebotomy at bedside.

## 2012-07-14 NOTE — ED Notes (Signed)
Pt last seen normal

## 2012-07-14 NOTE — ED Notes (Addendum)
CT read by neurologist, Dr. Cyril Mourning

## 2012-07-14 NOTE — ED Notes (Signed)
Lab called for patients PT, APTT. Specimen was clotted. Nurse put in new orders.

## 2012-07-14 NOTE — ED Notes (Signed)
Pt arrival.  

## 2012-07-14 NOTE — ED Notes (Signed)
EDP exam 

## 2012-07-14 NOTE — ED Notes (Signed)
Pt taken straight to treatment room.  PA and Dr. Ignacia Palma notified of pt's continued numbness without any deficits noted on triage exam.  Code Stroke called.  Fayrene Fearing, RN notified of pt.

## 2012-07-14 NOTE — ED Notes (Signed)
Per Neurologist, cancel code stroke

## 2012-07-14 NOTE — ED Provider Notes (Signed)
History     CSN: [id number]  Arrival date & time 07/14/12  2128   First MD Initiated Contact with Patient 07/14/12 2206      Chief Complaint  Patient presents with  . Code Stroke    (Consider location/radiation/quality/duration/timing/severity/associated sxs/prior treatment) Patient is a 67 y.o. female presenting with neurologic complaint. The history is provided by the patient. No language interpreter was used.  Neurologic Problem The primary symptoms include paresthesias, focal weakness and speech change. Primary symptoms do not include fever. Primary symptoms comment: Patient had sudden onset of occipital headache on the left side, followed by left facial numbness and slurred speech. This occurred about 8:30 PM. The headache lasted about 60 seconds, the difficulty with speech and 10 minutes. . The symptoms began 1 to 2 hours ago. The episode lasted 10 minutes. The symptoms are resolved. The neurological symptoms are focal.  Paresthesias began 1 - 3 hours ago. Progression of paresthesias: Feeling of numbness in the left side of the face. Affected locations include the: left side of the face.  Change in speech began 1 - 3 hours ago. Progression of speech change: Resolved. Features of the speech change include inability to speak fluently.  Medical issues also include hypertension and recent surgery.    Past Medical History  Diagnosis Date  . Asthma   . Hypertension   . Hiatal hernia   . DUB (dysfunctional uterine bleeding)   . SUI (stress urinary incontinence, female)   . Atrophic vaginitis   . Fibroid   . Fibromyalgia   . Reflux   . Shortness of breath     occ  . GERD (gastroesophageal reflux disease)   . Arthritis     generalized joint pain, including back    Past Surgical History  Procedure Laterality Date  . Abdominal hysterectomy  1994    TAH,BSO-BLADDER NECK SUSPENSION  . Surgery for poserior tibial tendon  2003  . Cystocele repair  1994  . Cholecystectomy,  laparoscopic  2001  . Tubal ligation    . Carpal tunnel release  2005  . Triple fusion left ankle  2007  . Oophorectomy      BSO  . Breast surgery      LUMPECTOMY-BENIGN Breast Bx X2  . Tonsillectomy    . Knee arthroscopy      bil  . Knee arthroplasty  05/19/2012    Procedure: COMPUTER ASSISTED TOTAL KNEE ARTHROPLASTY;  Surgeon: Eldred Manges, MD;  Location: [facility] OR;  Service: Orthopedics;  Laterality: Left;  Left  Total Knee Arthroplasty    Family History  Problem Relation Age of Onset  . Breast cancer Mother   . Breast cancer Sister   . Hypertension Sister   . Cancer Father     Brain tumor  . Cancer Maternal Aunt   . Cancer Maternal Uncle   . Hemochromatosis Sister   . Thalassemia Sister     History  Substance Use Topics  . Smoking status: Never Smoker   . Smokeless tobacco: Never Used  . Alcohol Use: Yes     Comment: rare    OB History   Grav Para Term Preterm Abortions TAB SAB Ect Mult Living   2 2        2       Review of Systems  Constitutional: Negative for fever and chills.  HENT: Negative.   Eyes: Negative.   Respiratory: Negative.   Cardiovascular: Negative.   Gastrointestinal: Negative.   Genitourinary: Negative.   Neurological: Positive  for speech change, focal weakness, speech difficulty, numbness and paresthesias.  Psychiatric/Behavioral: Negative.     Allergies  Iodine; Shellfish allergy; Sulfonamide derivatives; Other; and Amoxicillin  Home Medications   Current Outpatient Rx  Name  Route  Sig  Dispense  Refill  . aspirin EC 325 MG EC tablet   Oral   Take 1 tablet (325 mg total) by mouth daily with breakfast.   30 tablet      . Azelastine HCl (ASTEPRO) 0.15 % SOLN   Nasal   Place 2 sprays into the nose daily.         . beclomethasone (QVAR) 80 MCG/ACT inhaler   Inhalation   Inhale 1 puff into the lungs 2 (two) times daily.         Marland Kitchen conjugated estrogens (PREMARIN) vaginal cream      0.5 gram in vagina three times a week    45 g   3   . diltiazem (CARDIZEM CD) 240 MG 24 hr capsule      TAKE 1 CAPSULE (240 MG TOTAL) BY MOUTH DAILY.   30 capsule   6   . EPINEPHrine (EPIPEN) 0.3 mg/0.3 mL DEVI   Intramuscular   Inject 0.3 mg into the muscle as needed. For allergic reactions         . furosemide (LASIX) 40 MG tablet      TAKE 1 TABLET (40 MG TOTAL) BY MOUTH DAILY.   30 tablet   6   . levalbuterol (XOPENEX HFA) 45 MCG/ACT inhaler   Inhalation   Inhale 2 puffs into the lungs every 4 (four) hours as needed. For shortness of breath         . levocetirizine (XYZAL) 5 MG tablet   Oral   Take 5 mg by mouth daily.         Marland Kitchen losartan (COZAAR) 50 MG tablet   Oral   Take 50 mg by mouth daily.         . methocarbamol (ROBAXIN) 500 MG tablet   Oral   Take 1 tablet (500 mg total) by mouth every 6 (six) hours as needed.   40 tablet   0   . methocarbamol (ROBAXIN) 500 MG tablet   Oral   Take 1 tablet (500 mg total) by mouth every 6 (six) hours as needed.   30 tablet   0   . nortriptyline (PAMELOR) 25 MG capsule   Oral   Take 25 mg by mouth at bedtime.           . Olopatadine HCl (PATADAY) 0.2 % SOLN   Both Eyes   Place 1 drop into both eyes daily.         Marland Kitchen omeprazole (PRILOSEC) 40 MG capsule   Oral   Take 40 mg by mouth daily.         Marland Kitchen oxyCODONE-acetaminophen (ROXICET) 5-325 MG per tablet      1-2 q4-6 hr prn pain   60 tablet   0   . oxyCODONE-acetaminophen (ROXICET) 5-325 MG per tablet      1-2 q4-6 hrs prn pain   60 tablet   0     BP 152/82  Pulse 110  Temp(Src) 99 F (37.2 C) (Oral)  Resp 18  SpO2 94%  Physical Exam  Constitutional: She is oriented to person, place, and time. She appears well-developed and well-nourished. No distress.  HENT:  Head: Normocephalic and atraumatic.  Right Ear: External ear normal.  Left Ear: External ear  normal.  Mouth/Throat: Oropharynx is clear and moist.  Eyes: Conjunctivae and EOM are normal. Pupils are equal, round, and  reactive to light.  Neck: Normal range of motion. Neck supple.  Cardiovascular: Normal rate, regular rhythm and normal heart sounds.   Pulmonary/Chest: Effort normal and breath sounds normal.  Abdominal: Soft. Bowel sounds are normal.  Musculoskeletal: Normal range of motion.  Left TKR scar, well healed.  Neurological: She is alert and oriented to person, place, and time.  No sensory or motor deficit.  Skin: Skin is warm and dry.  Psychiatric: She has a normal mood and affect. Her behavior is normal.    ED Course  Procedures (including critical care time)  Labs Reviewed  PROTIME-INR  APTT  CBC  DIFFERENTIAL  COMPREHENSIVE METABOLIC PANEL  TROPONIN I  ETHANOL  URINE RAPID DRUG SCREEN (HOSP PERFORMED)  URINALYSIS, ROUTINE W REFLEX MICROSCOPIC   10:27 PM Pt was seen STAT --> physical exam was performed.  CODE STROKE was called.  Lab workup and CT of head was ordered.  10:38 PM Call from Signa Kell, M.D., radiologist.  Pt has no evidence of acute stroke on CT of the head.  12:09 AM Results for orders placed during the hospital encounter of 07/14/12  PROTIME-INR      Result Value Range   Prothrombin Time 10.6 (*) 11.6 - 15.2 seconds   INR 0.75  0.00 - 1.49  CBC      Result Value Range   WBC 11.3 (*) 4.0 - 10.5 K/uL   RBC 5.22 (*) 3.87 - 5.11 MIL/uL   Hemoglobin 14.9  12.0 - 15.0 g/dL   HCT 78.4  69.6 - 29.5 %   MCV 82.6  78.0 - 100.0 fL   MCH 28.5  26.0 - 34.0 pg   MCHC 34.6  30.0 - 36.0 g/dL   RDW 28.4  13.2 - 44.0 %   Platelets 279  150 - 400 K/uL  DIFFERENTIAL      Result Value Range   Neutrophils Relative 50  43 - 77 %   Neutro Abs 5.7  1.7 - 7.7 K/uL   Lymphocytes Relative 40  12 - 46 %   Lymphs Abs 4.5 (*) 0.7 - 4.0 K/uL   Monocytes Relative 8  3 - 12 %   Monocytes Absolute 0.9  0.1 - 1.0 K/uL   Eosinophils Relative 2  0 - 5 %   Eosinophils Absolute 0.2  0.0 - 0.7 K/uL   Basophils Relative 1  0 - 1 %   Basophils Absolute 0.1  0.0 - 0.1 K/uL   COMPREHENSIVE METABOLIC PANEL      Result Value Range   Sodium 139  135 - 145 mEq/L   Potassium 3.5  3.5 - 5.1 mEq/L   Chloride 101  96 - 112 mEq/L   CO2 24  19 - 32 mEq/L   Glucose, Bld 164 (*) 70 - 99 mg/dL   BUN 14  6 - 23 mg/dL   Creatinine, Ser 1.02  0.50 - 1.10 mg/dL   Calcium 9.4  8.4 - 72.5 mg/dL   Total Protein 7.7  6.0 - 8.3 g/dL   Albumin 4.0  3.5 - 5.2 g/dL   AST 22  0 - 37 U/L   ALT 22  0 - 35 U/L   Alkaline Phosphatase 100  39 - 117 U/L   Total Bilirubin 0.4  0.3 - 1.2 mg/dL   GFR calc non Af Amer >90  >90 mL/min  GFR calc Af Amer >90  >90 mL/min  TROPONIN I      Result Value Range   Troponin I <0.30  <0.30 ng/mL  ETHANOL      Result Value Range   Alcohol, Ethyl (B) <11  0 - 11 mg/dL  APTT      Result Value Range   aPTT 28  24 - 37 seconds  PROTIME-INR      Result Value Range   Prothrombin Time 12.1  11.6 - 15.2 seconds   INR 0.90  0.00 - 1.49  GLUCOSE, CAPILLARY      Result Value Range   Glucose-Capillary 158 (*) 70 - 99 mg/dL  POCT I-STAT, CHEM 8      Result Value Range   Sodium 140  135 - 145 mEq/L   Potassium 3.4 (*) 3.5 - 5.1 mEq/L   Chloride 105  96 - 112 mEq/L   BUN 15  6 - 23 mg/dL   Creatinine, Ser 1.91  0.50 - 1.10 mg/dL   Glucose, Bld 478 (*) 70 - 99 mg/dL   Calcium, Ion 2.95  6.21 - 1.30 mmol/L   TCO2 24  0 - 100 mmol/L   Hemoglobin 15.3 (*) 12.0 - 15.0 g/dL   HCT 30.8  65.7 - 84.6 %  POCT I-STAT TROPONIN I      Result Value Range   Troponin i, poc 0.00  0.00 - 0.08 ng/mL   Comment 3            Ct Head (brain) Wo Contrast  07/14/2012  *RADIOLOGY REPORT*  Clinical Data: Left sided headache  CT HEAD WITHOUT CONTRAST  Technique:  Contiguous axial images were obtained from the base of the skull through the vertex without contrast.  Comparison: None  Findings: There is mild patchy low attenuation within the subcortical white matter consistent with chronic small vessel ischemic change.  The brain has an otherwise normal appearance without  evidence for hemorrhage, infarction, hydrocephalus, or mass lesion.  There is no extra axial fluid collection.  The skull and paranasal sinuses are normal.  The mastoid air cells and paranasal sinuses are clear.  The skull is intact.  IMPRESSION:  1.  No acute intracranial abnormalities. 2.  Mild small vessel ischemic change.   Original Report Authenticated By: Signa Kell, M.D.     Lab workup is essentially negative.  Dr. Leroy Kennedy, neurologist, has seen pt and advised admission for completion of her TIA workup.   12:35 AM Case discussed with Dr. Adela Glimpse.  Admit to telemetry to Dr. Debby Bud.  1. Transient ischemic attack            Carleene Cooper III, MD 07/15/12 [phone]

## 2012-07-15 ENCOUNTER — Encounter (HOSPITAL_COMMUNITY): Payer: Self-pay | Admitting: Internal Medicine

## 2012-07-15 ENCOUNTER — Observation Stay (HOSPITAL_COMMUNITY): Payer: Medicare Other

## 2012-07-15 ENCOUNTER — Other Ambulatory Visit: Payer: Self-pay

## 2012-07-15 DIAGNOSIS — G459 Transient cerebral ischemic attack, unspecified: Principal | ICD-10-CM

## 2012-07-15 DIAGNOSIS — R930 Abnormal findings on diagnostic imaging of skull and head, not elsewhere classified: Secondary | ICD-10-CM | POA: Diagnosis not present

## 2012-07-15 DIAGNOSIS — I6789 Other cerebrovascular disease: Secondary | ICD-10-CM

## 2012-07-15 DIAGNOSIS — R209 Unspecified disturbances of skin sensation: Secondary | ICD-10-CM | POA: Diagnosis not present

## 2012-07-15 DIAGNOSIS — I10 Essential (primary) hypertension: Secondary | ICD-10-CM

## 2012-07-15 DIAGNOSIS — I6529 Occlusion and stenosis of unspecified carotid artery: Secondary | ICD-10-CM | POA: Diagnosis not present

## 2012-07-15 DIAGNOSIS — Z8673 Personal history of transient ischemic attack (TIA), and cerebral infarction without residual deficits: Secondary | ICD-10-CM | POA: Diagnosis present

## 2012-07-15 LAB — GLUCOSE, CAPILLARY: Glucose-Capillary: 124 mg/dL — ABNORMAL HIGH (ref 70–99)

## 2012-07-15 LAB — URINALYSIS, ROUTINE W REFLEX MICROSCOPIC
Bilirubin Urine: NEGATIVE
Glucose, UA: NEGATIVE mg/dL
Hgb urine dipstick: NEGATIVE
Ketones, ur: NEGATIVE mg/dL
Nitrite: NEGATIVE
Protein, ur: NEGATIVE mg/dL
Specific Gravity, Urine: 1.02 (ref 1.005–1.030)
Urobilinogen, UA: 1 mg/dL (ref 0.0–1.0)
pH: 5.5 (ref 5.0–8.0)

## 2012-07-15 LAB — RAPID URINE DRUG SCREEN, HOSP PERFORMED
Amphetamines: NOT DETECTED
Tetrahydrocannabinol: NOT DETECTED

## 2012-07-15 LAB — LIPID PANEL
HDL: 31 mg/dL — ABNORMAL LOW (ref >39)
Total CHOL/HDL Ratio: 4.5 RATIO
Triglycerides: 110 mg/dL (ref <150)

## 2012-07-15 LAB — URINE MICROSCOPIC-ADD ON

## 2012-07-15 LAB — HEMOGLOBIN A1C: Mean Plasma Glucose: 137 mg/dL — ABNORMAL HIGH (ref <117)

## 2012-07-15 MED ORDER — METHOCARBAMOL 500 MG PO TABS: 500.0000 mg | ORAL_TABLET | Freq: Two times a day (BID) | ORAL | Status: DC | PRN

## 2012-07-15 MED ORDER — ACETAMINOPHEN 325 MG PO TABS: 650.0000 mg | ORAL_TABLET | ORAL | Status: DC | PRN

## 2012-07-15 MED ORDER — LORAZEPAM BOLUS VIA INFUSION
1.0000 mg | Freq: Once | INTRAVENOUS | Status: DC
  Filled 2012-07-15: qty 1

## 2012-07-15 MED ORDER — SODIUM CHLORIDE 0.9 % IV SOLN
INTRAVENOUS | Status: AC
  Administered 2012-07-15: 03:00:00 via INTRAVENOUS

## 2012-07-15 MED ORDER — GADOBENATE DIMEGLUMINE 529 MG/ML IV SOLN
17.0000 mL | Freq: Once | INTRAVENOUS | Status: AC | PRN
  Administered 2012-07-15: 17 mL via INTRAVENOUS

## 2012-07-15 MED ORDER — LORAZEPAM 2 MG/ML IJ SOLN
INTRAMUSCULAR | Status: AC
  Administered 2012-07-15: 1 mg
  Filled 2012-07-15: qty 1

## 2012-07-15 MED ORDER — HYDROCODONE-ACETAMINOPHEN 7.5-325 MG PO TABS: 1.0000 | ORAL_TABLET | Freq: Four times a day (QID) | ORAL | Status: DC | PRN

## 2012-07-15 MED ORDER — OLOPATADINE HCL 0.1 % OP SOLN
1.0000 [drp] | Freq: Every day | OPHTHALMIC | Status: DC
  Administered 2012-07-15 – 2012-07-16 (×2): 1 [drp] via OPHTHALMIC
  Filled 2012-07-15: qty 5

## 2012-07-15 MED ORDER — ASPIRIN 325 MG PO TABS
325.0000 mg | ORAL_TABLET | Freq: Every day | ORAL | Status: DC
  Administered 2012-07-15 – 2012-07-16 (×2): 325 mg via ORAL
  Filled 2012-07-15 (×2): qty 1

## 2012-07-15 MED ORDER — LEVALBUTEROL TARTRATE 45 MCG/ACT IN AERO: 2.0000 | INHALATION_SPRAY | RESPIRATORY_TRACT | Status: DC | PRN

## 2012-07-15 MED ORDER — DILTIAZEM HCL ER COATED BEADS 240 MG PO CP24
240.0000 mg | ORAL_CAPSULE | Freq: Every day | ORAL | Status: DC
  Administered 2012-07-15 – 2012-07-16 (×2): 240 mg via ORAL
  Filled 2012-07-15 (×2): qty 1

## 2012-07-15 MED ORDER — LORAZEPAM 2 MG/ML IJ SOLN
1.0000 mg | Freq: Once | INTRAMUSCULAR | Status: AC
  Administered 2012-07-15: 1 mg via INTRAVENOUS

## 2012-07-15 MED ORDER — FLUTICASONE PROPIONATE HFA 44 MCG/ACT IN AERO
1.0000 | INHALATION_SPRAY | Freq: Two times a day (BID) | RESPIRATORY_TRACT | Status: DC
  Administered 2012-07-15 – 2012-07-16 (×3): 1 via RESPIRATORY_TRACT
  Filled 2012-07-15: qty 10.6

## 2012-07-15 MED ORDER — FUROSEMIDE 40 MG PO TABS
40.0000 mg | ORAL_TABLET | Freq: Every day | ORAL | Status: DC
  Administered 2012-07-15 – 2012-07-16 (×2): 40 mg via ORAL
  Filled 2012-07-15 (×2): qty 1

## 2012-07-15 MED ORDER — LORATADINE 10 MG PO TABS
10.0000 mg | ORAL_TABLET | Freq: Every day | ORAL | Status: DC
  Administered 2012-07-15 – 2012-07-16 (×2): 10 mg via ORAL
  Filled 2012-07-15 (×2): qty 1

## 2012-07-15 MED ORDER — LOSARTAN POTASSIUM 50 MG PO TABS
50.0000 mg | ORAL_TABLET | Freq: Every day | ORAL | Status: DC
  Administered 2012-07-15 – 2012-07-16 (×2): 50 mg via ORAL
  Filled 2012-07-15 (×2): qty 1

## 2012-07-15 MED ORDER — PANTOPRAZOLE SODIUM 40 MG PO TBEC
40.0000 mg | DELAYED_RELEASE_TABLET | Freq: Every day | ORAL | Status: DC
  Administered 2012-07-15 – 2012-07-16 (×2): 40 mg via ORAL
  Filled 2012-07-15: qty 1

## 2012-07-15 NOTE — Progress Notes (Signed)
  Echocardiogram 2D Echocardiogram has been performed.  Georgian Co 07/15/2012, 10:40 AM

## 2012-07-15 NOTE — ED Notes (Signed)
Pt and nurse ambulated to bathroom

## 2012-07-15 NOTE — H&P (Signed)
PCP:  Illene Regulus, MD  Rheumatology: Alben Deeds  Chief Complaint:   Left facial numbness  HPI: Kristy [name] is a 67 y.o. female   has a past medical history of Asthma; Hypertension; Hiatal hernia; DUB (dysfunctional uterine bleeding); SUI (stress urinary incontinence, female); Atrophic vaginitis; Fibroid; Fibromyalgia; Reflux; Shortness of breath; GERD (gastroesophageal reflux disease); and Arthritis.   Presented with  Around 8:30 pm today she developed left facial numbness and headache and slurred speech lasting 15 minutes. Denies any loss of strength no ataxia, no double vision. The numbness have persisted for about 4 hours. She have not had any chest pain, no fever. CT scan was unremarkable. Neurology have seen her in ER and recommend TIA wok up and admission.  2 months ago she had left knee replacement she was on aspirin 325 for DVT prophylaxis and this was  Self discontinued last month.   Review of Systems:   Pertinent positives include: localizing neurological complaints,  tingling, headaches, nausea, slurred speech   Constitutional:  No weight loss, night sweats, Fevers, chills, fatigue, weight loss  HEENT:  No  Difficulty swallowing,Tooth/dental problems,Sore throat,  No sneezing, itching, ear ache, nasal congestion, post nasal drip,  Cardio-vascular:  No chest pain, Orthopnea, PND, anasarca, dizziness, palpitations.no Bilateral lower extremity swelling  GI:  No heartburn, indigestion, abdominal pain,  vomiting, diarrhea, change in bowel habits, loss of appetite, melena, blood in stool, hematemesis Resp:  no shortness of breath at rest. No dyspnea on exertion, No excess mucus, no productive cough, No non-productive cough, No coughing up of blood.No change in color of mucus.No wheezing. Skin:  no rash or lesions. No jaundice GU:  no dysuria, change in color of urine, no urgency or frequency. No straining to urinate.  No flank pain.  Musculoskeletal:  No joint pain or  no joint swelling. No decreased range of motion. No back pain.  Psych:  No change in mood or affect. No depression or anxiety. No memory loss.  Neuro: no weakness, no double vision, no gait abnormality, no slurred speech, no confusion  Otherwise ROS are negative except for above, 10 systems were reviewed  Past Medical History: Past Medical History  Diagnosis Date  . Asthma   . Hypertension   . Hiatal hernia   . DUB (dysfunctional uterine bleeding)   . SUI (stress urinary incontinence, female)   . Atrophic vaginitis   . Fibroid   . Fibromyalgia   . Reflux   . Shortness of breath     occ  . GERD (gastroesophageal reflux disease)   . Arthritis     generalized joint pain, including back   Past Surgical History  Procedure Laterality Date  . Abdominal hysterectomy  1994    TAH,BSO-BLADDER NECK SUSPENSION  . Surgery for poserior tibial tendon  2003  . Cystocele repair  1994  . Cholecystectomy, laparoscopic  2001  . Tubal ligation    . Carpal tunnel release  2005  . Triple fusion left ankle  2007  . Oophorectomy      BSO  . Breast surgery      LUMPECTOMY-BENIGN Breast Bx X2  . Tonsillectomy    . Knee arthroscopy      bil  . Knee arthroplasty  05/19/2012    Procedure: COMPUTER ASSISTED TOTAL KNEE ARTHROPLASTY;  Surgeon: Eldred Manges, MD;  Location: [facility] OR;  Service: Orthopedics;  Laterality: Left;  Left  Total Knee Arthroplasty     Medications: Prior to Admission medications   Medication  Sig Start Date End Date Taking? Authorizing Provider  Azelastine HCl (ASTEPRO) 0.15 % SOLN Place 2 sprays into the nose daily.   Yes Historical Provider, MD  beclomethasone (QVAR) 80 MCG/ACT inhaler Inhale 1 puff into the lungs 2 (two) times daily.   Yes Historical Provider, MD  conjugated estrogens (PREMARIN) vaginal cream 0.5 gram in vagina three times a week 03/31/11  Yes Trellis Paganini, MD  diltiazem (CARDIZEM CD) 240 MG 24 hr capsule Take 240 mg by mouth daily.   Yes Historical  Provider, MD  EPINEPHrine (EPIPEN) 0.3 mg/0.3 mL DEVI Inject 0.3 mg into the muscle as needed. For allergic reactions   Yes Historical Provider, MD  furosemide (LASIX) 40 MG tablet Take 40 mg by mouth daily.   Yes Historical Provider, MD  HYDROcodone-acetaminophen (NORCO) 7.5-325 MG per tablet Take 1 tablet by mouth every 6 (six) hours as needed for pain.   Yes Historical Provider, MD  levalbuterol [facility] HFA) 45 MCG/ACT inhaler Inhale 2 puffs into the lungs every 4 (four) hours as needed. For shortness of breath   Yes Historical Provider, MD  levocetirizine (XYZAL) 5 MG tablet Take 5 mg by mouth daily.   Yes Historical Provider, MD  losartan (COZAAR) 50 MG tablet Take 50 mg by mouth daily.   Yes Historical Provider, MD  methocarbamol (ROBAXIN) 500 MG tablet Take 500 mg by mouth 2 (two) times daily between meals as needed.   Yes Historical Provider, MD  nortriptyline (PAMELOR) 25 MG capsule Take 25 mg by mouth at bedtime.     Yes Historical Provider, MD  Olopatadine HCl (PATADAY) 0.2 % SOLN Place 1 drop into both eyes daily.   Yes Historical Provider, MD  omeprazole (PRILOSEC) 40 MG capsule Take 40 mg by mouth daily.   Yes Historical Provider, MD  vitamin E 400 UNIT capsule Take 400 Units by mouth daily.   Yes Historical Provider, MD    Allergies:   Allergies  Allergen Reactions  . Iodine Anaphylaxis  . Shellfish Allergy Anaphylaxis  . Sulfonamide Derivatives Rash  . Other Swelling    Citrus  Sometimes Lactose intolerant  . Amoxicillin Diarrhea    Social History:  Ambulatory occasionally usses walker  Or cane Lives at  home   reports that she has never smoked. She has never used smokeless tobacco. She reports that  drinks alcohol. She reports that she does not use illicit drugs.   Family History: family history includes Breast cancer in her mother and sister; Cancer in her father, maternal aunt, and maternal uncle; Hemochromatosis in her sister; Hypertension in her sister; and  Thalassemia in her sister.    Physical Exam: Patient Vitals for the past 24 hrs:  BP Temp Temp src Pulse Resp SpO2  07/15/12 0003 135/73 mmHg 98.7 F (37.1 C) Oral 111 23 96 %  07/15/12 0001 - 98.7 F (37.1 C) - - - -  07/14/12 2345 135/73 mmHg - - - 18 -  07/14/12 2330 131/69 mmHg - - - 19 -  07/14/12 2315 143/66 mmHg - - - 14 -  07/14/12 2305 138/79 mmHg - - 103 20 98 %  07/14/12 2300 138/79 mmHg - - - 20 -  07/14/12 2245 157/74 mmHg - - - 18 -  07/14/12 2238 152/84 mmHg - - 110 18 98 %  07/14/12 2201 152/82 mmHg 99 F (37.2 C) Oral 110 18 94 %    1. General:  in No Acute distress 2. Psychological: Alert and  Oriented 3.  Head/ENT:   Moist  Mucous Membranes                          Head Non traumatic, neck supple                          Normal   Dentition 4. SKIN: normal  Skin turgor,  Skin clean Dry and intact no rash 5. Heart: Regular rate and rhythm no Murmur, Rub or gallop 6. Lungs: Clear to auscultation bilaterally, no wheezes or crackles   7. Abdomen: Soft, non-tender, Non distended 8. Lower extremities: no clubbing, cyanosis, or edema 9. Neurologically CN 2-12 intact , strength 5/5 in all 4 ext. 10. MSK: Normal range of motion  body mass index is unknown because there is no weight on file.   Labs on Admission:   Recent Labs  07/14/12 2208 07/14/12 2225  NA 139 140  K 3.5 3.4*  CL 101 105  CO2 24  --   GLUCOSE 164* 163*  BUN 14 15  CREATININE 0.60 0.70  CALCIUM 9.4  --     Recent Labs  07/14/12 2208  AST 22  ALT 22  ALKPHOS 100  BILITOT 0.4  PROT 7.7  ALBUMIN 4.0   No results found for this basename: LIPASE, AMYLASE,  in the last 72 hours  Recent Labs  07/14/12 2208 07/14/12 2225  WBC 11.3*  --   NEUTROABS 5.7  --   HGB 14.9 15.3*  HCT 43.1 45.0  MCV 82.6  --   PLT 279  --     Recent Labs  07/14/12 2209  TROPONINI <0.30   No results found for this basename: TSH, T4TOTAL, FREET3, T3FREE, THYROIDAB,  in the last 72 hours No  results found for this basename: VITAMINB12, FOLATE, FERRITIN, TIBC, IRON, RETICCTPCT,  in the last 72 hours Lab Results  Component Value Date   HGBA1C 6.4 05/05/2011    The CrCl is unknown because both a height and weight (above a minimum accepted value) are required for this calculation. ABG    Component Value Date/Time   TCO2 24 07/14/2012 2225     No results found for this basename: DDIMER    Cultures: No results found for this basename: sdes, specrequest, cult, reptstatus       Radiological Exams on Admission: Ct Head (brain) Wo Contrast  07/14/2012  *RADIOLOGY REPORT*  Clinical Data: Left sided headache  CT HEAD WITHOUT CONTRAST  Technique:  Contiguous axial images were obtained from the base of the skull through the vertex without contrast.  Comparison: None  Findings: There is mild patchy low attenuation within the subcortical white matter consistent with chronic small vessel ischemic change.  The brain has an otherwise normal appearance without evidence for hemorrhage, infarction, hydrocephalus, or mass lesion.  There is no extra axial fluid collection.  The skull and paranasal sinuses are normal.  The mastoid air cells and paranasal sinuses are clear.  The skull is intact.  IMPRESSION:  1.  No acute intracranial abnormalities. 2.  Mild small vessel ischemic change.   Original Report Authenticated By: Signa Kell, M.D.     Chart has been reviewed  Assessment/Plan Is a 67 year old female with history of hypertension fibromyalgia presents with TIA like symptoms  Present on Admission:  . TIA (transient ischemic attack) -  - will admit based on TIA/CVA protocol, await results of MRA/MRI, Carotid Doppler and Echo, obtain cardiac  enzymes,  ECG, , Lipid panel, TSH. Order PT/OT evaluation. Will make sure patient is on antiplatelet agent.  Neurology consult.     . Hypertension -we'll continue home medications    Prophylaxis: SCD Protonix  CODE STATUS: FULL CODE  Other plan as  per orders.  I have spent a total of 55 min on this admission  Liset Mcmonigle 07/15/2012, 1:04 AM

## 2012-07-15 NOTE — Progress Notes (Signed)
Stroke Team Progress Note  HISTORY Kristy [name] is an 67 y.o. female with a past medical history significant for hypertension, asthma, GERD, left knee replacement, who was in her usual state of health until 9 pm 07/14/2012 when she developed acute onset left occipital pain that was rapidly followed by a " numb sensation" in the left side of the face and slurred speech. She indicated that the speech difficulty lasted for approximately 10 minutes and then completely resolved. Stated that the left side of her face is still numb but there is no numbness or tingling of the left arm and leg. No vertigo, double vision, difficulty swallowing, focal weakness, or altered mental status.   NIHSS 0.  CT brain without contrast showed no acute intracranial abnormality.   LSN: 9 pm 07/14/2012  tPA Given: no, due to NIHSS 0   SUBJECTIVE The patient's husband is at the bedside this morning. The patient feels her left-sided facial numbness is slowly resolving. She feels her speech is for the most part back to normal. She recently returned from her MRI and we're awaiting the results. She did require IV Ativan for a history of claustrophobia.  OBJECTIVE Most recent Vital Signs: Filed Vitals:   07/15/12 0228 07/15/12 0230 07/15/12 0430 07/15/12 0630  BP:  149/72 131/71 122/71  Pulse:  99 96 91  Temp:  98.7 F (37.1 C) 97.6 F (36.4 C) 97.9 F (36.6 C)  TempSrc:      Resp:  18 18 18   Height: 5\' 5"  (1.651 m)     Weight: 92.262 kg (203 lb 6.4 oz)     SpO2:  95% 95% 96%   CBG (last 3)   Recent Labs  07/14/12 2310  GLUCAP 158*    IV Fluid Intake:     MEDICATIONS  . sodium chloride   Intravenous STAT  . aspirin  325 mg Oral Daily  . diltiazem  240 mg Oral Daily  . fluticasone  1 puff Inhalation BID  . furosemide  40 mg Oral Daily  . loratadine  10 mg Oral Daily  . LORazepam      . losartan  50 mg Oral Daily  . olopatadine  1 drop Both Eyes Daily  . pantoprazole  40 mg Oral Daily   PRN:   acetaminophen, HYDROcodone-acetaminophen, levalbuterol, methocarbamol  Diet:  Cardiac Thin liquids Activity:   Bathroom privileges with assistance DVT Prophylaxis:  SCD's  CLINICALLY SIGNIFICANT STUDIES Basic Metabolic Panel:  Recent Labs Lab 07/14/12 2208 07/14/12 2225  NA 139 140  K 3.5 3.4*  CL 101 105  CO2 24  --   GLUCOSE 164* 163*  BUN 14 15  CREATININE 0.60 0.70  CALCIUM 9.4  --    Liver Function Tests:  Recent Labs Lab 07/14/12 2208  AST 22  ALT 22  ALKPHOS 100  BILITOT 0.4  PROT 7.7  ALBUMIN 4.0   CBC:  Recent Labs Lab 07/14/12 2208 07/14/12 2225  WBC 11.3*  --   NEUTROABS 5.7  --   HGB 14.9 15.3*  HCT 43.1 45.0  MCV 82.6  --   PLT 279  --    Coagulation:  Recent Labs Lab 07/14/12 2208 07/14/12 2303  LABPROT 10.6* 12.1  INR 0.75 0.90   Cardiac Enzymes:  Recent Labs Lab 07/14/12 2209  TROPONINI <0.30   Urinalysis:  Recent Labs Lab 07/15/12 0140  COLORURINE YELLOW  LABSPEC 1.020  PHURINE 5.5  GLUCOSEU NEGATIVE  HGBUR NEGATIVE  BILIRUBINUR NEGATIVE  KETONESUR NEGATIVE  PROTEINUR NEGATIVE  UROBILINOGEN 1.0  NITRITE NEGATIVE  LEUKOCYTESUR MODERATE*   Lipid Panel    Component Value Date/Time   CHOL 138 07/15/2012 0500   TRIG 110 07/15/2012 0500   HDL 31* 07/15/2012 0500   CHOLHDL 4.5 07/15/2012 0500   VLDL 22 07/15/2012 0500   LDLCALC 85 07/15/2012 0500   HgbA1C  Lab Results  Component Value Date   HGBA1C 6.4 05/05/2011    Urine Drug Screen:     Component Value Date/Time   LABOPIA POSITIVE* 07/15/2012 0140   COCAINSCRNUR NONE DETECTED 07/15/2012 0140   LABBENZ NONE DETECTED 07/15/2012 0140   AMPHETMU NONE DETECTED 07/15/2012 0140   THCU NONE DETECTED 07/15/2012 0140   LABBARB NONE DETECTED 07/15/2012 0140    Alcohol Level:  Recent Labs Lab 07/14/12 2208  ETH <11    07/14/12 CT head No acute intracranial abnormalities. Mild small vessel ischemic change.   .    07/15/12 MRI of the brain  Mild atrophy. Mild to moderate  chronic microvascular ischemic  change. No acute stroke. No abnormal enhancement.  07/15/12 MRA head Unremarkable MR angiography intracranial circulation.  07/15/12 MRA neck Mild posterior wall plaque right internal carotid artery origin, without flow reducing stenosis or dissection. Otherwise unremarkable study.  07/15/12 2D Echocardiogram  Estimated ejection fraction was in the range of 55% to 60%. Wall motion was normal; there were no regional wall motion abnormalities.  EKG  pending  Therapy Recommendations pending  Physical Exam    General - This is a pleasant 67 year old female in no acute distress lying in bed. Heart - Regular rate and rhythm - no murmer Lungs - Clear to auscultation Abdomen - Soft - non tender Extremities - Distal pulses intact - no edema Skin - Warm and dry  Filed Vitals:   07/15/12 0230 07/15/12 0430 07/15/12 0630 07/15/12 1040  BP: 149/72 131/71 122/71 140/75  Pulse: 99 96 91 95  Temp: 98.7 F (37.1 C) 97.6 F (36.4 C) 97.9 F (36.6 C) 97.9 F (36.6 C)  TempSrc:    Oral  Resp: 18 18 18 18   Height:      Weight:      SpO2: 95% 95% 96% 96%   NEUROLOGIC:   MENTAL STATUS: awake, alert, Language fluent Follows simple commands. Naming intact   CRANIAL NERVES: pupils equal and reactive to light, visual fields full to confrontation, extraocular muscles intact, no nystagmus, strength symmetric, uvula midline, shoulder shrug symmetric, tongue midline; EXCEPT DECR SENSATION IN LEFT FACE.  MOTOR: normal bulk and tone, full strength in the BUE, BLE  SENSORY: normal and symmetric to light touch  COORDINATION: finger-nose-finger normal - heel to shin normal - rapid alternating movements normal REFLEXES: deep tendon reflexes present and symmetric - no babinski   ASSESSMENT [name] is a 67 y.o. female presenting with left facial paresthesias with dysarthria. TPA was not given secondary to rapid resolution of the symptoms. CT scan revealed no acute  intracranial abnormalities with small vessel ischemic changes. The patient was on no anticoagulation prior to admission. She is now on aspirin 325 mg daily for secondary stroke prevention. Patient with resultant mild left facial paresthesias with mild resolving dysarthria.  Dx: TIA (right brain, subcortical)   Hypertension history  Mild leukocytosis  Hospital day # 1  TREATMENT/PLAN  Continue aspirin 325 mg orally every day for secondary stroke prevention.  Workup complete, ok to discharge from neuro standpoint  F/u with PCP for BP control  Will sign off  Delton See PA-C [facility] Pager [phone] 07/15/2012, 10:15 AM  I have personally obtained a history, examined the patient, evaluated imaging results, and formulated the assessment and plan of care. I agree with the above.  [facility] - Stroke Team Joycelyn Schmid, MD 07/15/2012, 12:58 PM   Please refer to [email or web address] for on-call Stroke MD

## 2012-07-15 NOTE — Evaluation (Signed)
Physical Therapy Evaluation Patient Details Name: Kristy [name] MRN: [id number] DOB: [birth year] Today's Date: 07/15/2012 Time: 4098-1191 PT Time Calculation (min): 19 min  PT Assessment / Plan / Recommendation Clinical Impression  Pt 67 yo female admitted with L facial numbness and had h/o of L TKA 04/2012. Pt functioning near baseline. Pt instructed to con't to use RW both in home and community to decrease fall risk and to increase stability during amb. Spouse to be avail 24/7. Pt safe to d/c home when medically approriate.    PT Assessment  Patient needs continued PT services    Follow Up Recommendations  No PT follow up;Supervision/Assistance - 24 hour    Does the patient have the potential to tolerate intense rehabilitation      Barriers to Discharge None      Equipment Recommendations   (pt has all equip)    Recommendations for Other Services     Frequency Min 3X/week    Precautions / Restrictions Precautions Precautions: Fall Restrictions Weight Bearing Restrictions: No   Pertinent Vitals/Pain Pt denies pain      Mobility  Bed Mobility Bed Mobility: Supine to Sit;Sit to Supine Supine to Sit: 6: Modified independent (Device/Increase time);HOB flat Sit to Supine: 6: Modified independent (Device/Increase time);HOB flat Transfers Transfers: Sit to Stand;Stand to Sit Sit to Stand: 5: Supervision;With upper extremity assist;From bed Stand to Sit: 5: Supervision;With upper extremity assist;To bed Details for Transfer Assistance: pt with desire to reach for something to hold onto due to "I'm so dopey." Ambulation/Gait Ambulation/Gait Assistance: 4: Min assist;5: Supervision Ambulation Distance (Feet): 100 Feet Assistive device: Rolling walker;None Ambulation/Gait Assistance Details: minA when amb without device due to instability. Pt reports "I can't walk in a straight line. I've been unsteady for a long time." pt supevision when using RW with increased stability, no  episodes of LOB when using RW Gait Pattern: Step-through pattern (without RW pt with antalgic gait pattern) Gait velocity: wfl Stairs: No Modified Rankin (Stroke Patients Only) Pre-Morbid Rankin Score: No significant disability Modified Rankin: Slight disability    Exercises     PT Diagnosis: Difficulty walking  PT Problem List: Decreased balance;Decreased mobility PT Treatment Interventions: Gait training;Functional mobility training;Balance training   PT Goals Acute Rehab PT Goals PT Goal Formulation: With patient Time For Goal Achievement: 07/22/12 Potential to Achieve Goals: Good Pt will go Sit to Stand: with modified independence;with upper extremity assist (up to RW) PT Goal: Sit to Stand - Progress: Goal set today Pt will go Stand to Sit: with modified independence;with upper extremity assist PT Goal: Stand to Sit - Progress: Goal set today Pt will Ambulate: >150 feet;with modified independence;with rolling walker PT Goal: Ambulate - Progress: Goal set today Additional Goals Additional Goal #1: Pt to score >19 on DGI to indicate decreased falls risk. PT Goal: Additional Goal #1 - Progress: Goal set today  Visit Information  Last PT Received On: 07/15/12 Assistance Needed: +1    Subjective Data  Subjective: Pt received supine in bed with report "I feel a little dopey bc they gave me something for the MRI." Patient Stated Goal: home   Prior Functioning  Home Living Lives With: Spouse Available Help at Discharge: Family;Available 24 hours/day Type of Home: House Home Access: Ramped entrance Home Layout: One level Bathroom Shower/Tub: Engineer, manufacturing systems: Standard Bathroom Accessibility: Yes How Accessible: Accessible via walker Home Adaptive Equipment: Tub transfer bench;Walker - rolling;Straight cane Prior Function Level of Independence: Independent (uses cane for community) Able to  Take Stairs?: Yes Driving:  (limited due to progressive vision  deficits) Vocation: Retired Musician: No difficulties Dominant Hand: Right    Cognition  Cognition Overall Cognitive Status: Appears within functional limits for tasks assessed/performed Arousal/Alertness: Awake/alert Orientation Level: Oriented X4 / Intact Behavior During Session: WFL for tasks performed    Extremity/Trunk Assessment Face: pt reports dullness in L side of face/head Right Upper Extremity Assessment RUE ROM/Strength/Tone: Within functional levels RUE Sensation: WFL - Light Touch RUE Coordination: WFL - gross/fine motor Left Upper Extremity Assessment LUE ROM/Strength/Tone: Within functional levels LUE Sensation: WFL - Light Touch Right Lower Extremity Assessment RLE ROM/Strength/Tone: Within functional levels RLE Sensation: WFL - Light Touch RLE Coordination: WFL - gross/fine motor Left Lower Extremity Assessment LLE ROM/Strength/Tone: Within functional levels LLE Sensation: WFL - Light Touch LLE Coordination: WFL - gross/fine motor Trunk Assessment Trunk Assessment: Normal   Balance Balance Balance Assessed: Yes Static Sitting Balance Static Sitting - Balance Support: No upper extremity supported Static Sitting - Level of Assistance: 6: Modified independent (Device/Increase time) Static Sitting - Comment/# of Minutes: 3 Static Standing Balance Static Standing - Balance Support: Right upper extremity supported Static Standing - Level of Assistance: 4: Min assist Static Standing - Comment/# of Minutes: 30 sec  End of Session PT - End of Session Equipment Utilized During Treatment: Gait belt Activity Tolerance: Patient tolerated treatment well Patient left: in bed;with call bell/phone within reach (echo technician present) Nurse Communication: Mobility status  GP Functional Assessment Tool Used: clinical judgement Functional Limitation: Mobility: Walking and moving around Mobility: Walking and Moving Around Current Status ([id number]): At  least 1 percent but less than 20 percent impaired, limited or restricted Mobility: Walking and Moving Around Goal Status [phone]): At least 1 percent but less than 20 percent impaired, limited or restricted Mobility: Walking and Moving Around Discharge Status [phone]): At least 1 percent but less than 20 percent impaired, limited or restricted   Marcene Brawn 07/15/2012, 10:19 AM

## 2012-07-15 NOTE — Progress Notes (Signed)
UR completed 

## 2012-07-15 NOTE — Progress Notes (Signed)
Subjective: Ms. Salvatierra had an episode of severe headache followed by left facial numbness and difficulty with speech. She was seen within 4 hours in the [facility] ED. CT brain was normal. Dr. Leroy Kennedy for stroke neurology saw the patient: concern for TIA vs thalamic CVA. Not a candidate for TPA. She is admitted for routine stroke workup.   At this time she has no c/o and reports the numbness in the left face is better  Objective: Lab: Lab Results  Component Value Date   WBC 11.3* 07/14/2012   HGB 15.3* 07/14/2012   HCT 45.0 07/14/2012   MCV 82.6 07/14/2012   PLT 279 07/14/2012   BMET    Component Value Date/Time   NA 140 07/14/2012 2225   K 3.4* 07/14/2012 2225   CL 105 07/14/2012 2225   CO2 24 07/14/2012 2208   GLUCOSE 163* 07/14/2012 2225   BUN 15 07/14/2012 2225   CREATININE 0.70 07/14/2012 2225   CALCIUM 9.4 07/14/2012 2208   GFRNONAA >90 07/14/2012 2208   GFRAA >90 07/14/2012 2208     Imaging: MRI/MRA, carotid doppler, 2 d echo all pending.  Scheduled Meds: . sodium chloride   Intravenous STAT  . aspirin  325 mg Oral Daily  . diltiazem  240 mg Oral Daily  . fluticasone  1 puff Inhalation BID  . furosemide  40 mg Oral Daily  . loratadine  10 mg Oral Daily  . LORazepam      . losartan  50 mg Oral Daily  . olopatadine  1 drop Both Eyes Daily  . pantoprazole  40 mg Oral Daily   Continuous Infusions:  PRN Meds:.acetaminophen, HYDROcodone-acetaminophen, levalbuterol, methocarbamol   Physical Exam: Filed Vitals:   07/15/12 0630  BP: 122/71  Pulse: 91  Temp: 97.9 F (36.6 C)  Resp: 18   No exam - patient having studies done.     Assessment/Plan: 1. Neuro - TIA vs CVA work-up in progress.  Plan ASA  Complete work-up  Dispo - most likely home in AM   [employer] [facility] (o) [phone]; (c) [phone] Call-grp - [facility]  Tele: [phone]  07/15/2012, 10:08 AM

## 2012-07-15 NOTE — Progress Notes (Signed)
VASCULAR LAB PRELIMINARY  PRELIMINARY  PRELIMINARY  PRELIMINARY  Carotid duplex  completed.    Preliminary report:  Bilateral:  No evidence of hemodynamically significant internal carotid artery stenosis.   Vertebral artery flow is antegrade.      Rama Sorci, RVT 07/15/2012, 10:44 AM

## 2012-07-16 DIAGNOSIS — G459 Transient cerebral ischemic attack, unspecified: Secondary | ICD-10-CM | POA: Diagnosis not present

## 2012-07-16 DIAGNOSIS — I10 Essential (primary) hypertension: Secondary | ICD-10-CM | POA: Diagnosis not present

## 2012-07-16 LAB — GLUCOSE, CAPILLARY: Glucose-Capillary: 120 mg/dL — ABNORMAL HIGH (ref 70–99)

## 2012-07-16 MED ORDER — ASPIRIN 325 MG PO TABS: 325.0000 mg | ORAL_TABLET | Freq: Every day | ORAL | Status: DC

## 2012-07-16 NOTE — Progress Notes (Signed)
Work up complete. Ready for d/c home  Dictation [id number].

## 2012-07-16 NOTE — Discharge Summary (Signed)
Kristy [name], Kristy [name]                 ACCOUNT NO.:  [id number]  MEDICAL RECORD NO.:  [id number]  LOCATION:  [id number]                        FACILITY:  [facility]  PHYSICIAN:  Rosalyn Gess. Norins, MD  DATE OF BIRTH:  05-01-1946  DATE OF ADMISSION:  07/14/2012 DATE OF DISCHARGE:  07/16/2012                              DISCHARGE SUMMARY   ADMITTING DIAGNOSIS:  Transient neurologic event.  DISCHARGE DIAGNOSIS:  Transient neurologic event.  CONSULTANTS:  Dr. Leroy Kennedy for Neurology as well as Dr. Marjory Lies for Neurology.  PROCEDURES: 1. MRI of the brain that was negative and no sign of acute event. 2. MRA of the brain with no intracranial vascular abnormality. 3. Carotid Dopplers which were negative for any signs of a significant     carotid occlusion. 4. 2D echo which showed no PFO, no source of cardiac emboli.  HISTORY OF PRESENT ILLNESS:  Kristy [name] is a 67 year old woman followed for hypertension, asthma, GERD, who just underwent left knee replacement, who was in her usual state of health until 9 p.m. on the 14th when she developed acute onset of left occipital headache followed by numb sensation on the left side of her face and slurred speech.  She reports that this speech difficulty lasted about 10 minutes and then completely resolved.  By the time she arrived in the emergency department, she still has some left facial numbness which was improving. She never experienced vertigo, double vision, difficulty swallowing, focal weakness, or altered mental status.  The patient was assessed by the stroke team in the ER at the time of admission and was felt that have NIHSS class 0 and therefore not a candidate for tPA.  Please see the H and P for past medical history, family history, social history, and admission examination.  HOSPITAL COURSE: 1. Neurology.  The patient with what sounded like a transient     neurologic event.  She had a standard workup with negative imaging     studies as noted  above.  She was seen in consultation by the stroke     service.  She was seen by PT and OT and was felt to have no need     for either PT or OT on an ongoing basis.  She does have a 3 in 1     bedside commode, a tub shower seat which is related to her knee     Replacement.   It was felt the patient was stable to be discharged to home on     aspirin alone.  She will continue all of her other home     medications.  DISCHARGE EXAMINATION:  Vital signs:  Temperature was 97.9, blood pressure 146/62, pulse was 89, respiratory rate is 18, oxygen saturation 95% on room air.  General appearance:  This is a well-nourished, well- developed woman, in no acute distress.  HEENT:  Conjunctiva and sclerae were clear.  Neck:  Supple.  Chest:  The patient is moving air normally with no increased work of breathing.  Cardiovascular:  2+ radial pulse. Her precordium is quiet.  She had a regular rate and rhythm. Extremities:  The patient has a recently operated left knee with  a well- healed surgical scar.  Neuro:  The patient is awake, alert.  She is oriented to person, place, time and context.  Cranial nerves II-XII were grossly intact with normal facial symmetry and movement.  Extraocular muscles were intact.  Pupils are equal, round, and reactive.  Motor strength, the patient moves all of her extremities independently. Cerebellar, the patient has no tremor.  DISPOSITION:  The patient is discharged to home.  She will continue on all her home medications without change.  FOLLOWUP:  The patient will be seen in the office for followup in 7-10 days.  She is instructed to continue on 325 mg aspirin as well as other home medications.  The patient's condition at time of discharge dictation is stable and improved.     Rosalyn Gess Norins, MD     MEN/MEDQ  D:  07/16/2012  T:  07/16/2012  Job:  [id number]

## 2012-07-16 NOTE — Progress Notes (Deleted)
Occupational Therapy Treatment Patient Details Name: LOLITHA TORTORA MRN: [id number] DOB: 1945-06-09 Today's Date: 07/16/2012 Time: 9562-1308 OT Time Calculation (min): 44 min  OT Assessment / Plan / Recommendation Comments on Treatment Session  (Pt. very appreciative of OT and ed. provided. )    Follow Up Recommendations  Home health OT    Barriers to Discharge       Equipment Recommendations  3 in 1 bedside comode;Tub/shower seat    Recommendations for Other Services    Frequency     Plan Discharge plan remains appropriate    Precautions / Restrictions Precautions Precautions: Back Precaution Booklet Issued: Yes (comment) Precaution Comments:  (Pt. ed on back safety sheet) Required Braces or Orthoses: Spinal Brace Spinal Brace: Lumbar corset   Pertinent Vitals/Pain     ADL  Upper Body Bathing: Simulated;Supervision/safety Where Assessed - Upper Body Bathing: Unsupported sitting Lower Body Bathing: Simulated;Min guard Where Assessed - Lower Body Bathing: Unsupported sitting;Supported standing Lower Body Dressing: Performed;Minimal assistance Where Assessed - Lower Body Dressing: Unsupported sitting;Supported standing Transfers/Ambulation Related to ADLs: bed to chair transfer Min A with sit to stand farom bed and Min Guard assist wtihtransfer to chair with walker. ADL Comments: Pt. ed. on use of AE foar LE ADL's and was Min A to use and min cues. Pt. was ed. on use of toilet aid to clean back peri area. Pt. ed.  on showering with use of AE and simulateld activity. Pt. will need further OT to become more independent. Pt. deferred tub transfer secondary to receiving suppository.     OT Diagnosis:    OT Problem List:   OT Treatment Interventions:     OT Goals Acute Rehab OT Goals OT Goal Formulation: With patient ADL Goals Pt Will Perform Lower Body Dressing: with modified independence ADL Goal: Lower Body Dressing - Progress: Progressing toward goals Pt Will Transfer  to Toilet: with supervision ADL Goal: Toilet Transfer - Progress: Progressing toward goals Pt Will Perform Tub/Shower Transfer: Tub transfer;with min assist ADL Goal: Tub/Shower Transfer - Progress: Goal set today  Visit Information  Last OT Received On: 07/16/12 Assistance Needed: +1    Subjective Data      Prior Functioning       Cognition  Cognition Overall Cognitive Status: Appears within functional limits for tasks assessed/performed    Mobility  Bed Mobility Bed Mobility: Rolling Left Rolling Left: 5: Supervision Supine to Sit: 5: Supervision Details for Bed Mobility Assistance: Pt. ed. on proper technique to protect back. Transfers Sit to Stand: 4: Min assist;From bed Stand to Sit: 4: Min guard;To chair/3-in-1 Details for Transfer Assistance: Min guard assist wth walker to AMB 4 feet to chair.    Exercises      Balance     End of Session OT - End of Session Equipment Utilized During Treatment: Gait belt Activity Tolerance: Patient tolerated treatment well Patient left: in chair;with call bell/phone within reach  GO     Walaa Carel 07/16/2012, 12:37 PM

## 2012-07-17 DIAGNOSIS — IMO0001 Reserved for inherently not codable concepts without codable children: Secondary | ICD-10-CM | POA: Diagnosis not present

## 2012-07-17 DIAGNOSIS — M159 Polyosteoarthritis, unspecified: Secondary | ICD-10-CM | POA: Diagnosis not present

## 2012-07-17 DIAGNOSIS — M1A00X0 Idiopathic chronic gout, unspecified site, without tophus (tophi): Secondary | ICD-10-CM | POA: Diagnosis not present

## 2012-07-21 DIAGNOSIS — J309 Allergic rhinitis, unspecified: Secondary | ICD-10-CM | POA: Diagnosis not present

## 2012-07-24 ENCOUNTER — Other Ambulatory Visit: Payer: Self-pay | Admitting: Internal Medicine

## 2012-07-24 NOTE — Telephone Encounter (Signed)
Med filled.  

## 2012-07-26 ENCOUNTER — Ambulatory Visit (INDEPENDENT_AMBULATORY_CARE_PROVIDER_SITE_OTHER): Payer: Medicare Other | Admitting: Internal Medicine

## 2012-07-26 ENCOUNTER — Other Ambulatory Visit: Payer: Self-pay | Admitting: *Deleted

## 2012-07-26 ENCOUNTER — Encounter: Payer: Self-pay | Admitting: Internal Medicine

## 2012-07-26 VITALS — BP 136/88 | HR 105 | Temp 98.2°F | Resp 10 | Wt 203.0 lb

## 2012-07-26 DIAGNOSIS — J309 Allergic rhinitis, unspecified: Secondary | ICD-10-CM | POA: Diagnosis not present

## 2012-07-26 DIAGNOSIS — I10 Essential (primary) hypertension: Secondary | ICD-10-CM | POA: Diagnosis not present

## 2012-07-26 DIAGNOSIS — G459 Transient cerebral ischemic attack, unspecified: Secondary | ICD-10-CM | POA: Diagnosis not present

## 2012-07-26 MED ORDER — LOSARTAN POTASSIUM 50 MG PO TABS: 50.0000 mg | ORAL_TABLET | Freq: Every day | ORAL | Status: DC

## 2012-07-26 MED ORDER — FUROSEMIDE 40 MG PO TABS: 40.0000 mg | ORAL_TABLET | Freq: Every day | ORAL | Status: DC

## 2012-07-26 MED ORDER — DILTIAZEM HCL ER COATED BEADS 240 MG PO CP24: 240.0000 mg | ORAL_CAPSULE | Freq: Every day | ORAL | Status: DC

## 2012-07-26 MED ORDER — OMEPRAZOLE 40 MG PO CPDR: 40.0000 mg | DELAYED_RELEASE_CAPSULE | Freq: Every day | ORAL | Status: DC

## 2012-07-26 NOTE — Patient Instructions (Addendum)
1. Neurology - The hospital evaluation was normal completely. On exam today there is no neurologic deficits. Continue aspirin  2. Knee - getting better.  3. Malaise and weakness - not sleeping and combining that with muscle relaxants will really make you feel drowsy.    Sleep is a learned or unlearned behavior. 5 principles of sleep hygiene - 1) regular hour to retire and rise 7days/wk 2) no stimulants - caffeine, chocolat, alcohol, 3) regular exercise  - every afternoon  4) sleep sanctuary - a space that is right light, temperature, sound level, good bed where all you do is sleep. 5) No extinction behaviors, e.g. Laying in bed awake doing anything but sleeping. This means if you have a bad night - no naps, etc. 6) behavior modification - set a sleep period of 3 hrs and stay with this until you sleep 3 hrs most of the time, then extend to 4 hrs until you are sleeping 4 hrs most of the time, etc. Working your way back to 6-7 hrs per night.

## 2012-07-27 ENCOUNTER — Other Ambulatory Visit: Payer: Self-pay | Admitting: *Deleted

## 2012-07-27 MED ORDER — LOSARTAN POTASSIUM 50 MG PO TABS: 50.0000 mg | ORAL_TABLET | Freq: Every day | ORAL | Status: DC

## 2012-07-29 NOTE — Progress Notes (Signed)
Subjective:    Patient ID: Kristy [name], female    DOB: [birth year], 67 y.o.   MRN: [id number]  HPI Mrs. Sliwa presents for hospital follow-up with a recent admission and evaluation for TIA: she developed transient left facial numbness. Her neuro evaluation included MRI brainn - neg; MRA - neg; Carotid doppler w/o obstructive d; 2 D echo that was normal.   Since d/c home she has been doing well with almost complete resolution of left facial numbness. She has had no recurrent headache or other neurologic symptoms. She continues to make good progress with recovery from  Left TKR.  Past Medical History  Diagnosis Date  . Asthma   . Hypertension   . Hiatal hernia   . DUB (dysfunctional uterine bleeding)   . SUI (stress urinary incontinence, female)   . Atrophic vaginitis   . Fibroid   . Fibromyalgia   . Reflux   . Shortness of breath     occ  . GERD (gastroesophageal reflux disease)   . Arthritis     generalized joint pain, including back   Past Surgical History  Procedure Laterality Date  . Abdominal hysterectomy  1994    TAH,BSO-BLADDER NECK SUSPENSION  . Surgery for poserior tibial tendon  2003  . Cystocele repair  1994  . Cholecystectomy, laparoscopic  2001  . Tubal ligation    . Carpal tunnel release  2005  . Triple fusion left ankle  2007  . Oophorectomy      BSO  . Breast surgery      LUMPECTOMY-BENIGN Breast Bx X2  . Tonsillectomy    . Knee arthroscopy      bil  . Knee arthroplasty  05/19/2012    Procedure: COMPUTER ASSISTED TOTAL KNEE ARTHROPLASTY;  Surgeon: Eldred Manges, MD;  Location: [facility] OR;  Service: Orthopedics;  Laterality: Left;  Left  Total Knee Arthroplasty   Family History  Problem Relation Age of Onset  . Breast cancer Mother   . Breast cancer Sister   . Hypertension Sister   . Cancer Father     Brain tumor  . Cancer Maternal Aunt   . Cancer Maternal Uncle   . Hemochromatosis Sister   . Thalassemia Sister    History   Social History  .  Marital Status: Married    Spouse Name: N/A    Number of Children: 1  . 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 Smoking status: Never Smoker   . Smokeless tobacco: Never Used  . Alcohol Use: Yes     Comment: rare  . Drug Use: No  . Sexually Active: No   Other Topics Concern  . 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 married '69. 2 sons - '70, '72: retired - [employer]. SO- good health.   ACP - OK for DNR, no long term ventilation, no heroic measures in the face of poor quality of life. HCPOA - husband, secondary son Nanna Ertle (c) [phone]    Current Outpatient Prescriptions on File Prior to Visit  Medication Sig Dispense Refill  . aspirin 325 MG tablet Take 1 tablet (325 mg total) by mouth daily.      . Azelastine HCl (ASTEPRO) 0.15 % SOLN Place 2 sprays into the nose daily.      . beclomethasone (QVAR) 80 MCG/ACT inhaler Inhale 1 puff into the lungs 2 (two) times  daily.      . conjugated estrogens (PREMARIN) vaginal cream 0.5 gram in vagina three times a week  45 g  3  . EPINEPHrine (EPIPEN) 0.3 mg/0.3 mL DEVI Inject 0.3 mg into the muscle as needed. For allergic reactions      . HYDROcodone-acetaminophen (NORCO) 7.5-325 MG per tablet Take 1 tablet by mouth every 6 (six) hours as needed for pain.      Marland Kitchen levalbuterol (XOPENEX HFA) 45 MCG/ACT inhaler Inhale 2 puffs into the lungs every 4 (four) hours as needed. For shortness of breath      . levocetirizine (XYZAL) 5 MG tablet Take 5 mg by mouth daily.      . methocarbamol (ROBAXIN) 500 MG tablet Take 500 mg by mouth 2 (two) times daily between meals as needed.      . nortriptyline (PAMELOR) 25 MG capsule Take 25 mg by mouth at bedtime.        . Olopatadine HCl (PATADAY) 0.2 % SOLN Place 1 drop into both eyes daily.      . vitamin E 400 UNIT capsule Take 400 Units by mouth daily.       Current Facility-Administered Medications on File  Prior to Visit  Medication Dose Route Frequency Provider Last Rate Last Dose  . TDaP (BOOSTRIX) injection 0.5 mL  0.5 mL Intramuscular Once Jacques Navy, MD          Review of Systems System review is negative for any constitutional, cardiac, pulmonary, GI or neuro symptoms or complaints other than as described in the HPI.     Objective:   Physical Exam Filed Vitals:   07/26/12 1302  BP: 136/88  Pulse: 105  Temp: 98.2 F (36.8 C)  Resp: 10   Wt Readings from Last 3 Encounters:  07/26/12 203 lb (92.08 kg)  07/15/12 203 lb 6.4 oz (92.262 kg)  05/20/12 147 lb 9.6 oz (66.951 kg)   Gen'l - WNWD white woman in no distress HEENT_ C&S clear Neck supple Cor- 2+ radial pulse, RRR Pulm  - normal respirations Neuro - A&O x 3, CN II-XII normal with normal facial symmetry and movement, nl motor strength, normal cerebellar function with normal gait.        Assessment & Plan:

## 2012-07-29 NOTE — Assessment & Plan Note (Signed)
Feb '14: MRI - brain negative; MRA intracranial - negative; Carotid Doppler - no obstructive disease; 2 D echo - no PFO. Symptoms rapidly resolved. She has remained stable.  Plan Risk reduction  Full dose aspirin therapy

## 2012-07-29 NOTE — Assessment & Plan Note (Signed)
BP Readings from Last 3 Encounters:  07/26/12 136/88  07/16/12 146/62  05/22/12 142/59   Stable with adequate control.

## 2012-08-03 DIAGNOSIS — J309 Allergic rhinitis, unspecified: Secondary | ICD-10-CM | POA: Diagnosis not present

## 2012-08-10 DIAGNOSIS — J309 Allergic rhinitis, unspecified: Secondary | ICD-10-CM | POA: Diagnosis not present

## 2012-08-17 DIAGNOSIS — J309 Allergic rhinitis, unspecified: Secondary | ICD-10-CM | POA: Diagnosis not present

## 2012-08-29 ENCOUNTER — Other Ambulatory Visit: Payer: Self-pay | Admitting: Internal Medicine

## 2012-08-31 DIAGNOSIS — J309 Allergic rhinitis, unspecified: Secondary | ICD-10-CM | POA: Diagnosis not present

## 2012-09-08 DIAGNOSIS — J309 Allergic rhinitis, unspecified: Secondary | ICD-10-CM | POA: Diagnosis not present

## 2012-09-13 DIAGNOSIS — J309 Allergic rhinitis, unspecified: Secondary | ICD-10-CM | POA: Diagnosis not present

## 2012-09-19 ENCOUNTER — Ambulatory Visit (INDEPENDENT_AMBULATORY_CARE_PROVIDER_SITE_OTHER)
Admission: RE | Admit: 2012-09-19 | Discharge: 2012-09-19 | Disposition: A | Discharge status: Home / Self Care | Payer: Medicare Other | Source: Ambulatory Visit | Attending: Cardiovascular Disease | Admitting: Cardiovascular Disease

## 2012-09-19 ENCOUNTER — Encounter: Payer: Self-pay | Admitting: Internal Medicine

## 2012-09-19 ENCOUNTER — Ambulatory Visit (INDEPENDENT_AMBULATORY_CARE_PROVIDER_SITE_OTHER): Payer: Medicare Other | Admitting: Internal Medicine

## 2012-09-19 VITALS — BP 148/86 | HR 101 | Temp 96.8°F | Ht 65.0 in | Wt 205.6 lb

## 2012-09-19 DIAGNOSIS — R52 Pain, unspecified: Secondary | ICD-10-CM

## 2012-09-19 DIAGNOSIS — R3129 Other microscopic hematuria: Secondary | ICD-10-CM

## 2012-09-19 DIAGNOSIS — R109 Unspecified abdominal pain: Secondary | ICD-10-CM

## 2012-09-19 LAB — POCT URINALYSIS DIPSTICK
Bilirubin, UA: NEGATIVE
Glucose, UA: NEGATIVE
Nitrite, UA: NEGATIVE
Spec Grav, UA: 1.025

## 2012-09-19 NOTE — Patient Instructions (Signed)
Flank Pain  Flank pain refers to pain that is located on the side of the body between the upper abdomen and the back. It can be caused by many things.  CAUSES   Some of the more common causes of flank pain include:   Muscle strain.   Muscle spasms.   A disease of your spine (vertebral disk disease).   A lung infection (pneumonia).   Fluid around your lungs (pulmonary edema).   A kidney infection.   Kidney stones.   A very painful skin rash on only one side of your body (shingles).   Gallbladder disease.  DIAGNOSIS   Blood tests, urine tests, and X-rays may help your caregiver determine what is wrong.  TREATMENT   The treatment of pain depends on the cause. Your caregiver will determine what treatment will work best for you.  HOME CARE INSTRUCTIONS    Home care will depend on the cause of your pain.   Some medications may help relieve the pain. Take medication for relief of pain as directed by your caregiver.   Tell your caregiver about any changes in your pain.   Follow up with your caregiver.  SEEK IMMEDIATE MEDICAL CARE IF:    Your pain is not controlled with medication.   The pain increases.   You have abdominal pain.   You have shortness of breath.   You have persistent nausea or vomiting.   You have swelling in your abdomen.   You feel faint or pass out.   You have a temperature by mouth above 102 F (38.9 C), not controlled by medicine.  MAKE SURE YOU:    Understand these instructions.   Will watch your condition.   Will get help right away if you are not doing well or get worse.  Document Released: 07/08/2005 Document Revised: 08/09/2011 Document Reviewed: 11/01/2009  Exit[employer] Patient Information 2013 [employer].

## 2012-09-19 NOTE — Progress Notes (Signed)
HPI  Pt presents to the clinic today with c/o right flank pain. This started 3 days ago. The pain is a constant 7/10. It feels like a sharp stabbing pain. It hurts no matter what position she is in. She denies any other urinary symptoms. She has not had a kidney stone in the past. She denies fever, chills or body aches.  Review of Systems  Past Medical History  Diagnosis Date  . Asthma   . Hypertension   . Hiatal hernia   . DUB (dysfunctional uterine bleeding)   . SUI (stress urinary incontinence, female)   . Atrophic vaginitis   . Fibroid   . Fibromyalgia   . Reflux   . Shortness of breath     occ  . GERD (gastroesophageal reflux disease)   . Arthritis     generalized joint pain, including back    Family History  Problem Relation Age of Onset  . Breast cancer Mother   . Breast cancer Sister   . Hypertension Sister   . Cancer Father     Brain tumor  . Cancer Maternal Aunt   . Cancer Maternal Uncle   . Hemochromatosis Sister   . Thalassemia Sister     History   Social History  . Marital Status: Married    Spouse Name: N/A    Number of Children: 1  . 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 Smoking status: Never Smoker   . Smokeless tobacco: Never Used  . Alcohol Use: Yes     Comment: rare  . Drug Use: No  . Sexually Active: No   Other Topics Concern  . 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 married '69. 2 sons - '70, '72: retired - [employer]. SO- good health.   ACP - OK for DNR, no long term ventilation, no heroic measures in the face of poor quality of life. HCPOA - husband, secondary son Rebbeca Sheperd (c) [phone]    Allergies  Allergen Reactions  . Iodine Anaphylaxis  . Shellfish Allergy Anaphylaxis  . Sulfonamide Derivatives Rash  . Other Swelling    Citrus  Sometimes Lactose intolerant  . Amoxicillin Diarrhea    Constitutional: Denies fever, malaise,  fatigue, headache or abrupt weight changes.   GU: Pt reports right flank pain. Denies urgency, frequency, pain with urination, burning sensation, blood in urine, odor or discharge. Skin: Denies redness, rashes, lesions or ulcercations.   No other specific complaints in a complete review of systems (except as listed in HPI above).    Objective:   Physical Exam  BP 148/86  Pulse 101  Temp(Src) 96.8 F (36 C) (Oral)  Ht 5\' 5"  (1.651 m)  Wt 205 lb 9.6 oz (93.26 kg)  BMI 34.21 kg/m2  SpO2 96% Wt Readings from Last 3 Encounters:  09/19/12 205 lb 9.6 oz (93.26 kg)  07/26/12 203 lb (92.08 kg)  07/15/12 203 lb 6.4 oz (92.262 kg)    General: Appears her stated age, well developed, well nourished in NAD. Cardiovascular: Normal rate and rhythm. S1,S2 noted.  No murmur, rubs or gallops noted. No JVD or BLE edema. No carotid bruits noted. Pulmonary/Chest: Normal effort and positive vesicular breath sounds. No respiratory distress. No wheezes, rales or ronchi noted.  Abdomen: Soft and nontender. Normal bowel sounds, no bruits noted. No distention or masses noted. Liver, spleen and kidneys non palpable.  Tender to palpation over the bladder area. No CVA tenderness.      Assessment & Plan:   Right flank pain, new onset:  Urinalysis to r/o infection- positive blood Will obtain CT to r/o kidney stone Drink plenty of fluids  RTC as needed or if symptoms persist.

## 2012-09-28 DIAGNOSIS — J309 Allergic rhinitis, unspecified: Secondary | ICD-10-CM | POA: Diagnosis not present

## 2012-09-29 ENCOUNTER — Other Ambulatory Visit: Payer: Self-pay | Admitting: Internal Medicine

## 2012-10-06 DIAGNOSIS — J309 Allergic rhinitis, unspecified: Secondary | ICD-10-CM | POA: Diagnosis not present

## 2012-10-16 ENCOUNTER — Other Ambulatory Visit: Payer: Self-pay | Admitting: *Deleted

## 2012-10-18 ENCOUNTER — Encounter: Payer: Self-pay | Admitting: Neurology

## 2012-10-18 ENCOUNTER — Ambulatory Visit (INDEPENDENT_AMBULATORY_CARE_PROVIDER_SITE_OTHER): Payer: Medicare Other | Admitting: Neurology

## 2012-10-18 VITALS — BP 128/75 | HR 103 | Temp 98.0°F | Ht 65.0 in | Wt 210.0 lb

## 2012-10-18 DIAGNOSIS — G459 Transient cerebral ischemic attack, unspecified: Secondary | ICD-10-CM

## 2012-10-18 DIAGNOSIS — J309 Allergic rhinitis, unspecified: Secondary | ICD-10-CM | POA: Diagnosis not present

## 2012-10-18 NOTE — Patient Instructions (Addendum)
She was advised to continue aspirin for stroke prevention and maintain strict control of hypertension with blood pressure goal below 130/90. She was also advised to eat healthy, exercise and lose weight. Return for followup in 4 months with Larita Fife, NP.

## 2012-10-18 NOTE — Progress Notes (Signed)
[facility] [address] [place]. [place] [address] [phone]       OFFICE FOLLOW-UP NOTE  [name] Date of Birth:  03/21/1946 Medical Record Number:  [id number]   HPI: 67 year old Caucasian lady seen for her first office followup visit following hospital admission for TIA. She was admitted on 07/14/12 with sudden onset of numbness involving the left face and slurred speech following a brief occipital headache. Her headache and speech symptoms resolved but left facial numbness persisted. NIH stroke scale on admission was 0. CT scan was unremarkable. MRI scan did not show any acute infarct. MRA of the brain showed no large vessel stenosis. Lipid profile was normal hemoglobin A1c was borderline at 6.4%. Transthoracic echo showed normal ejection fraction without cardiac source of embolism. MRA of the neck showed only mild plaque without hemodynamically significant stenosis. She was started on aspirin for stroke prevention. Urine drug screen was negative. She denied history of migraine headaches the. She states she done well since discharge the her headache and speech problems have completely resolved. She still has mild minor procedures on left cheek which seemed to be improving but have not gone away completely. She states her blood pressure has been under good control and it is 128/7 fine office today. She has no new complaints. She plans to exercise regularly and start going to a swimming pool. She has no new complaints.  ROS:   14 system review of systems is positive for hearing loss, ringing in the ears, easy bruising, joint pain, joint swelling, aching muscle, dizziness, insomnia and allergies. PMH:  Past Medical History  Diagnosis Date  . Asthma   . Hypertension   . Hiatal hernia   . DUB (dysfunctional uterine bleeding)   . SUI (stress urinary incontinence, female)   . Atrophic vaginitis   . Fibroid   . Fibromyalgia   . Reflux   . Shortness of breath    occ  . GERD (gastroesophageal reflux disease)   . Arthritis     generalized joint pain, including back    Social History:  History   Social History  . Marital Status: Married    Spouse Name: Kristy [name]    Number of Children: 1  . 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 Smoking status: Never Smoker   . Smokeless tobacco: Never Used  . Alcohol Use: Yes     Comment: rare  . Drug Use: No  . Sexually Active: No   Other Topics Concern  . 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 married '69. 2 sons - '70, '72: retired - [employer]. SO- good health.   ACP - OK for DNR, no long term ventilation, no heroic measures in the face of poor quality of life. HCPOA - husband, secondary son Kristy [name] (c) [phone]   Caffeine Use: Occasionally    Medications:   Current Outpatient Prescriptions on File Prior to Visit  Medication Sig Dispense Refill  . aspirin 325 MG tablet Take 1 tablet (325 mg total) by mouth daily.      . Azelastine HCl (ASTEPRO) 0.15 % SOLN Place 2 sprays into the nose daily.      . beclomethasone (QVAR) 80 MCG/ACT inhaler Inhale 1 puff into the lungs 2 (two) times daily.      Marland Kitchen conjugated estrogens (PREMARIN) vaginal cream 0.5 gram in vagina three times  a week  45 g  3  . diltiazem (CARDIZEM CD) 240 MG 24 hr capsule Take 1 capsule (240 mg total) by mouth daily.  60 capsule  0  . EPINEPHrine (EPIPEN) 0.3 mg/0.3 mL DEVI Inject 0.3 mg into the muscle as needed. For allergic reactions      . furosemide (LASIX) 40 MG tablet Take 1 tablet (40 mg total) by mouth daily.  30 tablet  0  . levalbuterol (XOPENEX HFA) 45 MCG/ACT inhaler Inhale 2 puffs into the lungs every 4 (four) hours as needed. For shortness of breath      . levocetirizine (XYZAL) 5 MG tablet Take 5 mg by mouth daily.      Marland Kitchen losartan (COZAAR) 50 MG tablet Take 1 tablet (50 mg total) by mouth daily.  90 tablet  0  . losartan  (COZAAR) 50 MG tablet TAKE 1 TABLET EVERY MORNING  30 tablet  5  . nortriptyline (PAMELOR) 25 MG capsule Take 25 mg by mouth at bedtime.        . Olopatadine HCl (PATADAY) 0.2 % SOLN Place 1 drop into both eyes daily.      Marland Kitchen omeprazole (PRILOSEC) 40 MG capsule TAKE ONE CAPSULE BY MOUTH EVERY MORNING  30 capsule  11  . vitamin E 400 UNIT capsule Take 400 Units by mouth daily.       Current Facility-Administered Medications on File Prior to Visit  Medication Dose Route Frequency Provider Last Rate Last Dose  . TDaP (BOOSTRIX) injection 0.5 mL  0.5 mL Intramuscular Once Jacques Navy, MD        Allergies:   Allergies  Allergen Reactions  . Iodine Anaphylaxis  . Shellfish Allergy Anaphylaxis  . Sulfonamide Derivatives Rash  . Other Swelling    Citrus  Sometimes Lactose intolerant  . Amoxicillin Diarrhea   Filed Vitals:   10/18/12 1437  BP: 128/75  Pulse: 103  Temp: 98 F (36.7 C)    Physical Exam General: Obese middle-aged Caucasian lady, seated, in no evident distress Head: head normocephalic and atraumatic. Orohparynx benign Neck: supple with no carotid or supraclavicular bruits Cardiovascular: regular rate and rhythm, no murmurs Musculoskeletal: no deformity Skin:  no rash/petichiae Vascular:  Normal pulses all extremities  Neurologic Exam Mental Status: Awake and fully alert. Oriented to place and time. Recent and remote memory intact. Attention span, concentration and fund of knowledge appropriate. Mood and affect appropriate.  Cranial Nerves: Fundoscopic exam reveals sharp disc margins. Pupils equal, briskly reactive to light. Extraocular movements full without nystagmus. Visual fields full to confrontation. Hearing intact. Facial sensation intact. Face, tongue, palate moves normally and symmetrically.  Motor: Normal bulk and tone. Normal strength in all tested extremity muscles. Sensory.: intact to tough and pinprick and vibratory.  Coordination: Rapid alternating  movements normal in all extremities. Finger-to-nose and heel-to-shin performed accurately bilaterally. Gait and Station: Arises from chair without difficulty. Stance is normal. Gait demonstrates normal stride length and balance . Unable to heel, toe and tandem walk without difficulty.  Reflexes: 1+ and symmetric. Toes downgoing.     ASSESSMENT: 10 year lady with a right brain TIA in February 2014 likely due to small vessel disease. Vascular structures of hypertension, age and sex only.    PLAN: Continue aspirin for stroke prevention along with strict control of hypertension with blood pressure goal below 130/90. I have advised her to exercise, lose weight. Return for followup in 4 months with Larita Fife, NP

## 2012-10-26 DIAGNOSIS — J309 Allergic rhinitis, unspecified: Secondary | ICD-10-CM | POA: Diagnosis not present

## 2012-11-03 DIAGNOSIS — J309 Allergic rhinitis, unspecified: Secondary | ICD-10-CM | POA: Diagnosis not present

## 2012-11-06 DIAGNOSIS — J309 Allergic rhinitis, unspecified: Secondary | ICD-10-CM | POA: Diagnosis not present

## 2012-11-21 DIAGNOSIS — M1710 Unilateral primary osteoarthritis, unspecified knee: Secondary | ICD-10-CM | POA: Diagnosis not present

## 2012-11-21 DIAGNOSIS — J309 Allergic rhinitis, unspecified: Secondary | ICD-10-CM | POA: Diagnosis not present

## 2012-11-30 DIAGNOSIS — J309 Allergic rhinitis, unspecified: Secondary | ICD-10-CM | POA: Diagnosis not present

## 2012-12-07 DIAGNOSIS — J309 Allergic rhinitis, unspecified: Secondary | ICD-10-CM | POA: Diagnosis not present

## 2012-12-20 DIAGNOSIS — J309 Allergic rhinitis, unspecified: Secondary | ICD-10-CM | POA: Diagnosis not present

## 2012-12-25 DIAGNOSIS — J309 Allergic rhinitis, unspecified: Secondary | ICD-10-CM | POA: Diagnosis not present

## 2013-01-01 ENCOUNTER — Other Ambulatory Visit: Payer: Self-pay | Admitting: Internal Medicine

## 2013-01-04 DIAGNOSIS — J3089 Other allergic rhinitis: Secondary | ICD-10-CM | POA: Diagnosis not present

## 2013-01-04 DIAGNOSIS — H1045 Other chronic allergic conjunctivitis: Secondary | ICD-10-CM | POA: Diagnosis not present

## 2013-01-04 DIAGNOSIS — J3081 Allergic rhinitis due to animal (cat) (dog) hair and dander: Secondary | ICD-10-CM | POA: Diagnosis not present

## 2013-01-04 DIAGNOSIS — J301 Allergic rhinitis due to pollen: Secondary | ICD-10-CM | POA: Diagnosis not present

## 2013-01-04 DIAGNOSIS — J309 Allergic rhinitis, unspecified: Secondary | ICD-10-CM | POA: Diagnosis not present

## 2013-01-09 ENCOUNTER — Encounter: Payer: Self-pay | Admitting: Gynecology

## 2013-01-09 ENCOUNTER — Ambulatory Visit (INDEPENDENT_AMBULATORY_CARE_PROVIDER_SITE_OTHER): Payer: Medicare Other | Admitting: Gynecology

## 2013-01-09 VITALS — BP 142/88 | Ht 64.0 in | Wt 211.0 lb

## 2013-01-09 DIAGNOSIS — R32 Unspecified urinary incontinence: Secondary | ICD-10-CM | POA: Diagnosis not present

## 2013-01-09 DIAGNOSIS — N951 Menopausal and female climacteric states: Secondary | ICD-10-CM | POA: Diagnosis not present

## 2013-01-09 DIAGNOSIS — Z803 Family history of malignant neoplasm of breast: Secondary | ICD-10-CM

## 2013-01-09 DIAGNOSIS — Z780 Asymptomatic menopausal state: Secondary | ICD-10-CM

## 2013-01-09 DIAGNOSIS — N952 Postmenopausal atrophic vaginitis: Secondary | ICD-10-CM

## 2013-01-09 DIAGNOSIS — J309 Allergic rhinitis, unspecified: Secondary | ICD-10-CM | POA: Diagnosis not present

## 2013-01-09 MED ORDER — NONFORMULARY OR COMPOUNDED ITEM: Status: DC

## 2013-01-09 NOTE — Patient Instructions (Addendum)
BRCA-1 and BRCA-2 BRCA-1 and BRCA-2 are 2 genes that are linked with hereditary breast and ovarian cancers. About 200,000 women are diagnosed with invasive breast cancer each year and about 23,000 with ovarian cancer (according to the American Cancer Society). Of these cancers, about 5% to 10% will be due to a mutation in one of the BRCA genes. Men can also inherit an increased risk of developing breast cancer, primarily from an alteration in the BRCA-2 gene.  Individuals with mutations in BRCA1 or BRCA2 have significantly elevated risks for breast cancer (up to 80% lifetime risk), ovarian cancer (up to 40% lifetime risk), bilateral breast cancer and other types of cancers. BRCA mutations are inherited and passed from generation to generation. One half of the time, they are passed from the father's side of the family.  The DNA in white blood cells is used to detect mutations in the BRCA genes. While the gene products (proteins) of the BRCA genes act only in breast and ovarian tissue, the genes are present in every cell of the body and blood is the most easily accessible source of that DNA. PREPARATION FOR TEST The test for BRCA mutations is done on a blood sample collected by needle from a vein in the arm. The test does not require surgical biopsy of breast or ovarian tissue.  NORMAL FINDINGS No genetic mutations. Ranges for normal findings may vary among different laboratories and hospitals. You should always check with your doctor after having lab work or other tests done to discuss the meaning of your test results and whether your values are considered within normal limits. MEANING OF TEST  Your caregiver will go over the test results with you and discuss the importance and meaning of your results, as well as treatment options and the need for additional tests if necessary. OBTAINING THE TEST RESULTS It is your responsibility to obtain your test results. Ask the lab or department performing the test  when and how you will get your results. OTHER THINGS TO KNOW Your test results may have implications for other family members. When one member of a family is tested for BRCA mutations, issues often arise about how or whether to share this information with other family members. Seek advice from a genetic counselor about communication of result with your family members.  Pre and post test consultation with a health care provider knowledgeable about genetic testing cannot be overemphasized.  There are many issues to be considered when preparing for a genetic test and upon learning the results, and a genetic counselor has the knowledge and experience to help you sort through them.  If the BRCA test is positive, the options include increased frequency of check-ups (e.g., mammography, blood tests for CA-125, or transvaginal ultrasonography); medications that could reduce risk (e.g., oral contraceptives or tamoxifen); or surgical removal of the ovaries or breasts. There are a number of variables involved and it is important to discuss your options with your doctor and genetic counselor. Research studies have reported that for every 1000 women negative for BRCA mutations, between 12 and 45 of them will develop breast cancer by age 50 and between 3 and 4 will develop ovarian cancer by age 50. The risk increases with age. The test can be ordered by a doctor, preferably by one who can also offer genetic counseling. The blood sample will be sent to a laboratory that specializes in BRCA testing. The American Society of Clinical Oncology and the National Breast Cancer Coalition encourage women seeking the   test to participate in long-term outcome studies to help gather information on the effectiveness of different check-up and treatment options. Document Released: 06/10/2004 Document Revised: 08/09/2011 Document Reviewed: 04/22/2008 [facility] Patient Information 2014 [place], [place]. Shingles Vaccine What You Need to  Know WHAT IS SHINGLES?  Shingles is a painful skin rash, often with blisters. It is also called Herpes Zoster or just Zoster.  A shingles rash usually appears on one side of the face or body and lasts from 2 to 4 weeks. Its main symptom is pain, which can be quite severe. Other symptoms of shingles can include fever, headache, chills, and upset stomach. Very rarely, a shingles infection can lead to pneumonia, hearing problems, blindness, brain inflammation (encephalitis), or death.  For about 1 person in 5, severe pain can continue even after the rash clears up. This is called post-herpetic neuralgia.  Shingles is caused by the Varicella Zoster virus. This is the same virus that causes chickenpox. Only someone who has had a case of chickenpox or rarely, has gotten chickenpox vaccine, can get shingles. The virus stays in your body. It can reappear many years later to cause a case of shingles.  You cannot catch shingles from another person with shingles. However, a person who has never had chickenpox (or chickenpox vaccine) could get chickenpox from someone with shingles. This is not very common.  Shingles is far more common in people 44 and older than in younger people. It is also more common in people whose immune systems are weakened because of a disease such as cancer or drugs such as steroids or chemotherapy.  At least 1 million people get shingles per year in the [place]. SHINGLES VACCINE  A vaccine for shingles was licensed in 2006. In clinical trials, the vaccine reduced the risk of shingles by 50%. It can also reduce the pain in people who still get shingles after being vaccinated.  A single dose of shingles vaccine is recommended for adults 62 years of age and older. SOME PEOPLE SHOULD NOT GET SHINGLES VACCINE OR SHOULD WAIT A person should not get shingles vaccine if he or she:  Has ever had a life-threatening allergic reaction to gelatin, the antibiotic neomycin, or any other  component of shingles vaccine. Tell your caregiver if you have any severe allergies.  Has a weakened immune system because of current:  AIDS or another disease that affects the immune system.  Treatment with drugs that affect the immune system, such as prolonged use of high-dose steroids.  Cancer treatment, such as radiation or chemotherapy.  Cancer affecting the bone marrow or lymphatic system, such as leukemia or lymphoma.  Is pregnant, or might be pregnant. Women should not become pregnant until at least 4 weeks after getting shingles vaccine. Someone with a minor illness, such as a cold, may be vaccinated. Anyone with a moderate or severe acute illness should usually wait until he or she recovers before getting the vaccine. This includes anyone with a temperature of 101.3 F (38 C) or higher. WHAT ARE THE RISKS FROM SHINGLES VACCINE?  A vaccine, like any medicine, could possibly cause serious problems, such as severe allergic reactions. However, the risk of a vaccine causing serious harm, or death, is extremely small.  No serious problems have been identified with shingles vaccine. Mild Problems  Redness, soreness, swelling, or itching at the site of the injection (about 1 person in 3).  Headache (about 1 person in 70). Like all vaccines, shingles vaccine is being closely monitored for  unusual or severe problems. WHAT IF THERE IS A MODERATE OR SEVERE REACTION? What should I look for? Any unusual condition, such as a severe allergic reaction or a high fever. If a severe allergic reaction occurred, it would be within a few minutes to an hour after the shot. Signs of a serious allergic reaction can include difficulty breathing, weakness, hoarseness or wheezing, a fast heartbeat, hives, dizziness, paleness, or swelling of the throat. What should I do?  Call your caregiver, or get the person to a caregiver right away.  Tell the caregiver what happened, the date and time it happened,  and when the vaccination was given.  Ask the caregiver to report the reaction by filing a Vaccine Adverse Event Reporting System (VAERS) form. Or, you can file this report through the VAERS web site at www.vaers.LAgents.no or by calling 1-714 494 1357. VAERS does not provide medical advice. HOW CAN I LEARN MORE?  Ask your caregiver. He or she can give you the vaccine package insert or suggest other sources of information.  Contact the Centers for Disease Control and Prevention (CDC):  Call 873-082-6019 (1-800-CDC-INFO).  Visit the CDC website at PicCapture.uy CDC Shingles Vaccine VIS (03/05/08) Document Released: 03/14/2006 Document Revised: 08/09/2011 Document Reviewed: 03/05/2008 [employer] Patient Information 2014 [place], [place].

## 2013-01-09 NOTE — Progress Notes (Addendum)
Kristy [name] Oct 01, 1945 [id number]   History:    67 y.o.  for GYN exam and followup. Patient with past history of atrophic vaginitis who had been on Premarin vaginal cream twice a week currently not taking. Patient with prior history of total abdominal hysterectomy with bilateral salpingo-oophorectomy as well as bladder neck suspension. She still occasionally will complaining of stress urinary incontinence. She's had some on and off right breast tenderness but no discernible mass. Patient with past mammogram October of 2013 which was dense but was analyzed with 3 D. The patient was strong family history of breast cancer were by her sister and cousin both were BRCA1 and BRCA2 positive for the gene mutation. Her mother had breast cancer at the age of 40. Patient has not obtained her BRCA1 and BRCA2 gene mutation testing. Patient tells me that the sister with the BRCA1 and BRCA2 gene mutation has 2 daughters who were tested and were negative for the gene. Patient also states that her other sister with no history of breast cancer was tested and was back again negative. Patient does her monthly breast exam. Patient prior to her hysterectomy had no history of abnormal Pap smear. Her last bone density study was in 2008. The patient has not received her shingles vaccine yet Tdap vaccine is up-to-date. Of note her previous breast biopsy consisted of fibroadenoma benign findings. Patient has had history of colon polyps in the past and her last colonoscopy was in 2012. Past medical history,surgical history, family history and social history were all reviewed and documented in the EPIC chart.  Gynecologic History No LMP recorded. Patient has had a hysterectomy. Contraception: post menopausal status Last Pap: 2009. Results were: normal Last mammogram: 2013. Results were: normal  Obstetric History OB History   Grav Para Term Preterm Abortions TAB SAB Ect Mult Living   2 2        2      # Outc Date GA Lbr Len/2nd  Wgt Sex Del Anes PTL Lv   1 PAR            2 PAR                ROS: A ROS was performed and pertinent positives and negatives are included in the history.  GENERAL: No fevers or chills. HEENT: No change in vision, no earache, sore throat or sinus congestion. NECK: No pain or stiffness. CARDIOVASCULAR: No chest pain or pressure. No palpitations. PULMONARY: No shortness of breath, cough or wheeze. GASTROINTESTINAL: No abdominal pain, nausea, vomiting or diarrhea, melena or bright red blood per rectum. GENITOURINARY: No urinary frequency, urgency, hesitancy or dysuria. MUSCULOSKELETAL: No joint or muscle pain, no back pain, no recent trauma. DERMATOLOGIC: No rash, no itching, no lesions. ENDOCRINE: No polyuria, polydipsia, no heat or cold intolerance. No recent change in weight. HEMATOLOGICAL: No anemia or easy bruising or bleeding. NEUROLOGIC: No headache, seizures, numbness, tingling or weakness. PSYCHIATRIC: No depression, no loss of interest in normal activity or change in sleep pattern.     Exam: chaperone present  BP 142/88  Ht 5\' 4"  (1.626 m)  Wt 211 lb (95.709 kg)  BMI 36.2 kg/m2  Body mass index is 36.2 kg/(m^2).  General appearance : Well developed well nourished female. No acute distress HEENT: Neck supple, trachea midline, no carotid bruits, no thyroidmegaly Lungs: Clear to auscultation, no rhonchi or wheezes, or rib retractions  Heart: Regular rate and rhythm, no murmurs or gallops Breast:Examined in sitting and supine position were symmetrical  in appearance, no palpable masses or tenderness,  no skin retraction, no nipple inversion, no nipple discharge, no skin discoloration, no axillary or supraclavicular lymphadenopathy Abdomen: no palpable masses or tenderness, no rebound or guarding Extremities: no edema or skin discoloration or tenderness  Pelvic:  Bartholin, Urethra, Skene Glands: Within normal limits             Vagina: No gross lesions or discharge,atrophic  changes  Cervix:absent  Uterus Absent  Adnexa  Without masses or tenderness  Anus and perineum  normal   Rectovaginal  normal sphincter tone without palpated masses or tenderness             Hemoccult course to be provided     Assessment/Plan:  67 y.o. female with vaginal atrophy. We discussed Kegel exercises for stress urinary incontinence and recommended that she follow up with the urologist but she states that it is not that bad. We discussed the new Pap smear guidelines. No Pap smear done today. Patient will schedule her bone density study here in the office in the next few weeks. Prescription for shingles vaccine was provided. She is to submit to the office the Hemoccult cards for testing. We discussed importance of calcium and vitamin D for osteoporosis prevention along with regular exercise. It was once again recommended the patient have genetic counseling for BR CA 1 BRCA2 testing.patient to schedule her mammogram.    Ok Edwards MD, 3:12 PM 01/09/2013

## 2013-01-11 DIAGNOSIS — J309 Allergic rhinitis, unspecified: Secondary | ICD-10-CM | POA: Diagnosis not present

## 2013-01-12 ENCOUNTER — Encounter: Payer: Self-pay | Admitting: Obstetrics and Gynecology

## 2013-01-15 DIAGNOSIS — M1A00X0 Idiopathic chronic gout, unspecified site, without tophus (tophi): Secondary | ICD-10-CM | POA: Diagnosis not present

## 2013-01-15 DIAGNOSIS — M159 Polyosteoarthritis, unspecified: Secondary | ICD-10-CM | POA: Diagnosis not present

## 2013-01-15 DIAGNOSIS — IMO0001 Reserved for inherently not codable concepts without codable children: Secondary | ICD-10-CM | POA: Diagnosis not present

## 2013-01-17 DIAGNOSIS — J309 Allergic rhinitis, unspecified: Secondary | ICD-10-CM | POA: Diagnosis not present

## 2013-01-23 ENCOUNTER — Encounter: Payer: Self-pay | Admitting: Obstetrics and Gynecology

## 2013-01-23 DIAGNOSIS — J309 Allergic rhinitis, unspecified: Secondary | ICD-10-CM | POA: Diagnosis not present

## 2013-01-30 DIAGNOSIS — J309 Allergic rhinitis, unspecified: Secondary | ICD-10-CM | POA: Diagnosis not present

## 2013-02-12 DIAGNOSIS — J309 Allergic rhinitis, unspecified: Secondary | ICD-10-CM | POA: Diagnosis not present

## 2013-02-25 ENCOUNTER — Other Ambulatory Visit: Payer: Self-pay | Admitting: Internal Medicine

## 2013-02-28 DIAGNOSIS — J309 Allergic rhinitis, unspecified: Secondary | ICD-10-CM | POA: Diagnosis not present

## 2013-03-01 ENCOUNTER — Ambulatory Visit (INDEPENDENT_AMBULATORY_CARE_PROVIDER_SITE_OTHER): Payer: Medicare Other | Admitting: Nurse Practitioner

## 2013-03-01 ENCOUNTER — Encounter: Payer: Self-pay | Admitting: Nurse Practitioner

## 2013-03-01 VITALS — BP 142/83 | HR 100 | Temp 97.4°F | Ht 65.0 in | Wt 212.0 lb

## 2013-03-01 DIAGNOSIS — G459 Transient cerebral ischemic attack, unspecified: Secondary | ICD-10-CM

## 2013-03-01 DIAGNOSIS — I10 Essential (primary) hypertension: Secondary | ICD-10-CM

## 2013-03-01 DIAGNOSIS — R42 Dizziness and giddiness: Secondary | ICD-10-CM | POA: Diagnosis not present

## 2013-03-01 NOTE — Patient Instructions (Addendum)
Continue aspirin for stroke prevention along with strict control of hypertension with blood pressure goal below 130/90. I have advised her to exercise, lose weight. Return for followup in 6 months with Larita Fife, NP

## 2013-03-01 NOTE — Progress Notes (Signed)
[facility]  PATIENT: Kristy [name] DOB: 06-26-1945   REASON FOR VISIT: follow up HISTORY FROM: patient  HISTORY OF PRESENT ILLNESS: 67 year old Caucasian lady seen for her first office followup visit following hospital admission for TIA. She was admitted on 07/14/12 with sudden onset of numbness involving the left face and slurred speech following a brief occipital headache. Her headache and speech symptoms resolved but left facial numbness persisted. NIH stroke scale on admission was 0. CT scan was unremarkable. MRI scan did not show any acute infarct. MRA of the brain showed no large vessel stenosis. Lipid profile was normal hemoglobin A1c was borderline at 6.4%. Transthoracic echo showed normal ejection fraction without cardiac source of embolism. MRA of the neck showed only mild plaque without hemodynamically significant stenosis. She was started on aspirin for stroke prevention. She denied history of migraine headaches. She states she has done well since discharge; her headache and speech problems have completely resolved. She states her blood pressure has been under good control and it is 128/7 in office today. She has no new complaints. She plans to exercise regularly and start going to a swimming pool.    UPDATE 03/01/13 (LL): [name] comes in for TIA followup.  She has had no recurrent TIA symptoms, but occasionally gets a brief sharp pain in her head.  She has an area on her left cheek that she says has decreased sensation since the TIA.  She has difficulty with dizziness and motion sickness, and sometimes falls for no reason.  She sometimes uses a cane to help with balance.  She denies weakness in her legs but has had total knee replacement on the left and needs the right side done, plans are for next month.  She states her left ankle has 4 screws in it as well.  She states her blood pressure is well controlled, is 142/83 in office today.  ROS:  14 system review of  systems is positive for dizziness.  All others negative.  ALLERGIES: Allergies  Allergen Reactions  . Iodine Anaphylaxis  . Shellfish Allergy Anaphylaxis  . Sulfonamide Derivatives Rash  . Other Swelling    Citrus  Sometimes Lactose intolerant  . Amoxicillin Diarrhea    HOME MEDICATIONS: Outpatient Prescriptions Prior to Visit  Medication Sig Dispense Refill  . aspirin 325 MG tablet Take 1 tablet (325 mg total) by mouth daily.      . Azelastine HCl (ASTEPRO) 0.15 % SOLN Place 2 sprays into the nose daily.      . beclomethasone (QVAR) 80 MCG/ACT inhaler Inhale 1 puff into the lungs 2 (two) times daily.      Marland Kitchen conjugated estrogens (PREMARIN) vaginal cream 0.5 gram in vagina three times a week  45 g  3  . diltiazem (CARDIZEM CD) 240 MG 24 hr capsule TAKE 1 CAPSULE (240 MG TOTAL) BY MOUTH DAILY.  30 capsule  6  . EPINEPHrine (EPIPEN) 0.3 mg/0.3 mL DEVI Inject 0.3 mg into the muscle as needed. For allergic reactions      . furosemide (LASIX) 40 MG tablet Take 1 tablet (40 mg total) by mouth daily.  30 tablet  0  . furosemide (LASIX) 40 MG tablet TAKE 1 TABLET (40 MG TOTAL) BY MOUTH DAILY.  30 tablet  6  . levalbuterol (XOPENEX HFA) 45 MCG/ACT inhaler Inhale 2 puffs into the lungs every 4 (four) hours as needed. For shortness of breath      . levocetirizine (XYZAL) 5 MG tablet Take 5  mg by mouth daily.      Marland Kitchen losartan (COZAAR) 50 MG tablet TAKE 1 TABLET EVERY MORNING  30 tablet  5  . NONFORMULARY OR COMPOUNDED ITEM Estradiol .02% 1 ML Prefilled Applicator Sig: apply vaginally twice a week #90 Day Supply with 4 refills  1 each  4  . nortriptyline (PAMELOR) 25 MG capsule Take 25 mg by mouth at bedtime.        . Olopatadine HCl (PATADAY) 0.2 % SOLN Place 1 drop into both eyes daily.      Marland Kitchen omeprazole (PRILOSEC) 40 MG capsule TAKE ONE CAPSULE BY MOUTH EVERY MORNING  30 capsule  11  . vitamin E 400 UNIT capsule Take 400 Units by mouth daily.       No facility-administered medications prior  to visit.    PAST MEDICAL HISTORY: Past Medical History  Diagnosis Date  . Asthma   . Hypertension   . Hiatal hernia   . DUB (dysfunctional uterine bleeding)   . SUI (stress urinary incontinence, female)   . Atrophic vaginitis   . Fibroid   . Fibromyalgia   . Reflux   . Shortness of breath     occ  . GERD (gastroesophageal reflux disease)   . Arthritis     generalized joint pain, including back  . TIA (transient ischemic attack)     07/2012    PAST SURGICAL HISTORY: Past Surgical History  Procedure Laterality Date  . Abdominal hysterectomy  1994    TAH,BSO-BLADDER NECK SUSPENSION  . Surgery for poserior tibial tendon  2003  . Cystocele repair  1994  . Cholecystectomy, laparoscopic  2001  . Tubal ligation    . Carpal tunnel release  2005  . Triple fusion left ankle  2007  . Oophorectomy      BSO  . Breast surgery      LUMPECTOMY-BENIGN Breast Bx X2  . Tonsillectomy    . Knee arthroscopy      bil  . Knee arthroplasty  05/19/2012    Procedure: COMPUTER ASSISTED TOTAL KNEE ARTHROPLASTY;  Surgeon: Eldred Manges, MD;  Location: [facility] OR;  Service: Orthopedics;  Laterality: Left;  Left  Total Knee Arthroplasty    FAMILY HISTORY: Family History  Problem Relation Age of Onset  . Breast cancer Mother   . Breast cancer Sister 107    postitive gene BRCA2  . Hypertension Sister   . Cancer Father     Brain tumor  . Cancer Maternal Aunt   . Cancer Maternal Uncle   . Hemochromatosis Sister   . Thalassemia Sister   . Other Sister     negative for BRCA1 BRCA2  . Breast cancer Cousin     postive gene    SOCIAL HISTORY: History   Social History  . Marital Status: Married    Spouse Name: Jeri Cos    Number of Children: 1  . 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 Smoking status: Never Smoker   . Smokeless tobacco: Never Used  . Alcohol Use: Yes     Comment: rare  . Drug Use: No  . Sexual Activity: No    Other Topics Concern  . 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 married '69. 2 sons - '70, '72: retired - [employer]. SO- good health.   ACP - OK for DNR, no long term ventilation, no heroic  measures in the face of poor quality of life. HCPOA - husband, secondary son Roneshia Drew (c) [phone]   Caffeine Use: Occasionally     PHYSICAL EXAM  There were no vitals filed for this visit. There is no weight on file to calculate BMI.  General: Obese middle-aged Caucasian lady, seated, in no evident distress  Head: head normocephalic and atraumatic. Orohparynx benign  Neck: supple with no carotid or supraclavicular bruits  Cardiovascular: regular rate and rhythm, no murmurs  Musculoskeletal: no deformity  Skin: no rash/petichiae  Vascular: Normal pulses all extremities   Neurologic Exam  Mental Status: Awake and fully alert. Oriented to place and time. Recent and remote memory intact. Attention span, concentration and fund of knowledge appropriate. Mood and affect appropriate.  Cranial Nerves:  Pupils equal, briskly reactive to light. Extraocular movements full without nystagmus. Visual fields full to confrontation. Hearing intact. Facial sensation intact. Face, tongue, palate moves normally and symmetrically.  Motor: Normal bulk and tone. Normal strength in all tested extremity muscles.  Sensory.: intact to tough and pinprick and vibratory.  Coordination: Rapid alternating movements normal in all extremities. Finger-to-nose and heel-to-shin performed accurately bilaterally.  Gait and Station: Arises from chair without difficulty. Stance is normal. Gait demonstrates normal stride length and balance . Unable to heel, toe and tandem walk without difficulty.  Reflexes: 1+ and symmetric. Toes downgoing.   DIAGNOSTIC DATA (LABS, IMAGING, TESTING) - I reviewed patient records, labs, notes, testing and imaging myself where available.  Lab Results  Component  Value Date   WBC 11.3* 07/14/2012   HGB 15.3* 07/14/2012   HCT 45.0 07/14/2012   MCV 82.6 07/14/2012   PLT 279 07/14/2012      Component Value Date/Time   NA 140 07/14/2012 2225   K 3.4* 07/14/2012 2225   CL 105 07/14/2012 2225   CO2 24 07/14/2012 2208   GLUCOSE 163* 07/14/2012 2225   BUN 15 07/14/2012 2225   CREATININE 0.70 07/14/2012 2225   CALCIUM 9.4 07/14/2012 2208   PROT 7.7 07/14/2012 2208   ALBUMIN 4.0 07/14/2012 2208   AST 22 07/14/2012 2208   ALT 22 07/14/2012 2208   ALKPHOS 100 07/14/2012 2208   BILITOT 0.4 07/14/2012 2208   GFRNONAA >90 07/14/2012 2208   GFRAA >90 07/14/2012 2208   Lab Results  Component Value Date   CHOL 138 07/15/2012   HDL 31* 07/15/2012   LDLCALC 85 07/15/2012   TRIG 110 07/15/2012   CHOLHDL 4.5 07/15/2012   Lab Results  Component Value Date   HGBA1C 6.4* 07/15/2012   No results found for this basename: [id number]   Lab Results  Component Value Date   TSH 1.32 02/24/2009   MRI HEAD WITHOUT AND WITH CONTRAST 07/15/12 Mild atrophy. Mild to moderate chronic microvascular ischemic change. No acute stroke. No abnormal enhancement.  MRA HEAD WITHOUT CONTRAST 07/15/12 Unremarkable MR angiography intracranial circulation.  MRA NECK WITHOUT AND WITH CONTRAST 07/15/12 Mild posterior wall plaque right internal carotid artery origin, without flow reducing stenosis or dissection. Otherwise unremarkable study.   ASSESSMENT AND PLAN 59 year lady with a right brain TIA in February 2014 likely due to small vessel disease. Vascular risk factors of hypertension, age and sex only.  She has dizziness and balance problems, offered physical therapy for gait and balance training, she defers at this time due to plans for right total knee replacement soon.  PLAN:  Continue aspirin for stroke prevention along with strict control of hypertension with blood pressure goal below  140/90. I have advised her to exercise, lose weight. Return for followup in 6 months with Larita Fife, NP  Tawny Asal  Mayrene Bastarache, MSN, NP-C 03/01/2013, 1:18 PM [facility] [address] [place], [place] [address] [phone]  \

## 2013-03-08 ENCOUNTER — Ambulatory Visit (INDEPENDENT_AMBULATORY_CARE_PROVIDER_SITE_OTHER): Payer: Medicare Other

## 2013-03-08 DIAGNOSIS — J309 Allergic rhinitis, unspecified: Secondary | ICD-10-CM | POA: Diagnosis not present

## 2013-03-08 DIAGNOSIS — Z780 Asymptomatic menopausal state: Secondary | ICD-10-CM

## 2013-03-08 DIAGNOSIS — M899 Disorder of bone, unspecified: Secondary | ICD-10-CM | POA: Diagnosis not present

## 2013-03-08 DIAGNOSIS — M8580 Other specified disorders of bone density and structure, unspecified site: Secondary | ICD-10-CM

## 2013-03-15 DIAGNOSIS — J309 Allergic rhinitis, unspecified: Secondary | ICD-10-CM | POA: Diagnosis not present

## 2013-03-22 DIAGNOSIS — J309 Allergic rhinitis, unspecified: Secondary | ICD-10-CM | POA: Diagnosis not present

## 2013-03-28 DIAGNOSIS — J309 Allergic rhinitis, unspecified: Secondary | ICD-10-CM | POA: Diagnosis not present

## 2013-04-04 DIAGNOSIS — J309 Allergic rhinitis, unspecified: Secondary | ICD-10-CM | POA: Diagnosis not present

## 2013-04-05 ENCOUNTER — Other Ambulatory Visit: Payer: Self-pay

## 2013-04-18 DIAGNOSIS — J309 Allergic rhinitis, unspecified: Secondary | ICD-10-CM | POA: Diagnosis not present

## 2013-04-19 ENCOUNTER — Other Ambulatory Visit: Payer: Self-pay

## 2013-04-19 ENCOUNTER — Other Ambulatory Visit: Payer: Self-pay | Admitting: *Deleted

## 2013-04-19 DIAGNOSIS — Z1231 Encounter for screening mammogram for malignant neoplasm of breast: Secondary | ICD-10-CM

## 2013-04-19 DIAGNOSIS — M8580 Other specified disorders of bone density and structure, unspecified site: Secondary | ICD-10-CM

## 2013-04-20 ENCOUNTER — Other Ambulatory Visit: Payer: Medicare Other

## 2013-04-20 DIAGNOSIS — M899 Disorder of bone, unspecified: Secondary | ICD-10-CM | POA: Diagnosis not present

## 2013-04-20 DIAGNOSIS — Z23 Encounter for immunization: Secondary | ICD-10-CM | POA: Diagnosis not present

## 2013-04-20 DIAGNOSIS — M8580 Other specified disorders of bone density and structure, unspecified site: Secondary | ICD-10-CM

## 2013-04-21 LAB — VITAMIN D 25 HYDROXY (VIT D DEFICIENCY, FRACTURES): Vit D, 25-Hydroxy: 23 ng/mL — ABNORMAL LOW (ref 30–89)

## 2013-04-23 ENCOUNTER — Other Ambulatory Visit: Payer: Self-pay | Admitting: Gynecology

## 2013-04-23 DIAGNOSIS — E559 Vitamin D deficiency, unspecified: Secondary | ICD-10-CM

## 2013-04-23 LAB — PTH, INTACT AND CALCIUM
Calcium: 9.8 mg/dL (ref 8.4–10.5)
PTH: 125 pg/mL — ABNORMAL HIGH (ref 14.0–72.0)

## 2013-04-23 MED ORDER — VITAMIN D (ERGOCALCIFEROL) 1.25 MG (50000 UNIT) PO CAPS: 50000.0000 [IU] | ORAL_CAPSULE | ORAL | Status: DC

## 2013-04-25 DIAGNOSIS — J309 Allergic rhinitis, unspecified: Secondary | ICD-10-CM | POA: Diagnosis not present

## 2013-05-01 DIAGNOSIS — J309 Allergic rhinitis, unspecified: Secondary | ICD-10-CM | POA: Diagnosis not present

## 2013-05-01 DIAGNOSIS — Z96659 Presence of unspecified artificial knee joint: Secondary | ICD-10-CM | POA: Diagnosis not present

## 2013-05-01 DIAGNOSIS — M1710 Unilateral primary osteoarthritis, unspecified knee: Secondary | ICD-10-CM | POA: Diagnosis not present

## 2013-05-10 DIAGNOSIS — J309 Allergic rhinitis, unspecified: Secondary | ICD-10-CM | POA: Diagnosis not present

## 2013-05-15 DIAGNOSIS — J309 Allergic rhinitis, unspecified: Secondary | ICD-10-CM | POA: Diagnosis not present

## 2013-05-22 ENCOUNTER — Ambulatory Visit
Admission: RE | Admit: 2013-05-22 | Discharge: 2013-05-22 | Disposition: A | Discharge status: Home / Self Care | Payer: Medicare Other | Source: Ambulatory Visit

## 2013-05-22 DIAGNOSIS — Z1231 Encounter for screening mammogram for malignant neoplasm of breast: Secondary | ICD-10-CM

## 2013-05-22 DIAGNOSIS — J309 Allergic rhinitis, unspecified: Secondary | ICD-10-CM | POA: Diagnosis not present

## 2013-05-29 DIAGNOSIS — J309 Allergic rhinitis, unspecified: Secondary | ICD-10-CM | POA: Diagnosis not present

## 2013-05-31 DIAGNOSIS — Z1371 Encounter for nonprocreative screening for genetic disease carrier status: Secondary | ICD-10-CM

## 2013-05-31 HISTORY — DX: Encounter for nonprocreative screening for genetic disease carrier status: Z13.71

## 2013-06-05 ENCOUNTER — Other Ambulatory Visit (INDEPENDENT_AMBULATORY_CARE_PROVIDER_SITE_OTHER): Payer: Medicare Other

## 2013-06-05 ENCOUNTER — Ambulatory Visit (INDEPENDENT_AMBULATORY_CARE_PROVIDER_SITE_OTHER): Payer: Medicare Other | Admitting: Internal Medicine

## 2013-06-05 ENCOUNTER — Encounter: Payer: Self-pay | Admitting: Internal Medicine

## 2013-06-05 VITALS — BP 142/98 | HR 104 | Temp 96.9°F | Ht 65.0 in | Wt 208.0 lb

## 2013-06-05 DIAGNOSIS — M949 Disorder of cartilage, unspecified: Secondary | ICD-10-CM

## 2013-06-05 DIAGNOSIS — R7989 Other specified abnormal findings of blood chemistry: Secondary | ICD-10-CM

## 2013-06-05 DIAGNOSIS — Z0000 Encounter for general adult medical examination without abnormal findings: Secondary | ICD-10-CM | POA: Diagnosis not present

## 2013-06-05 DIAGNOSIS — R7309 Other abnormal glucose: Secondary | ICD-10-CM

## 2013-06-05 DIAGNOSIS — IMO0001 Reserved for inherently not codable concepts without codable children: Secondary | ICD-10-CM

## 2013-06-05 DIAGNOSIS — Z23 Encounter for immunization: Secondary | ICD-10-CM

## 2013-06-05 DIAGNOSIS — I10 Essential (primary) hypertension: Secondary | ICD-10-CM

## 2013-06-05 DIAGNOSIS — M8580 Other specified disorders of bone density and structure, unspecified site: Secondary | ICD-10-CM

## 2013-06-05 DIAGNOSIS — E215 Disorder of parathyroid gland, unspecified: Secondary | ICD-10-CM | POA: Diagnosis not present

## 2013-06-05 DIAGNOSIS — M109 Gout, unspecified: Secondary | ICD-10-CM

## 2013-06-05 DIAGNOSIS — H8309 Labyrinthitis, unspecified ear: Secondary | ICD-10-CM

## 2013-06-05 DIAGNOSIS — J45909 Unspecified asthma, uncomplicated: Secondary | ICD-10-CM

## 2013-06-05 DIAGNOSIS — M1712 Unilateral primary osteoarthritis, left knee: Secondary | ICD-10-CM

## 2013-06-05 DIAGNOSIS — M899 Disorder of bone, unspecified: Secondary | ICD-10-CM

## 2013-06-05 LAB — LIPID PANEL
Cholesterol: 189 mg/dL (ref 0–200)
HDL: 33.4 mg/dL — ABNORMAL LOW (ref >39.00)
Total CHOL/HDL Ratio: 6
Triglycerides: 241 mg/dL — ABNORMAL HIGH (ref 0.0–149.0)
VLDL: 48.2 mg/dL — ABNORMAL HIGH (ref 0.0–40.0)

## 2013-06-05 LAB — HEMOGLOBIN A1C: Hgb A1c MFr Bld: 6.7 % — ABNORMAL HIGH (ref 4.6–6.5)

## 2013-06-05 LAB — COMPREHENSIVE METABOLIC PANEL
ALBUMIN: 4.7 g/dL (ref 3.5–5.2)
ALT: 40 U/L — ABNORMAL HIGH (ref 0–35)
AST: 27 U/L (ref 0–37)
Alkaline Phosphatase: 84 U/L (ref 39–117)
BUN: 14 mg/dL (ref 6–23)
CO2: 23 mEq/L (ref 19–32)
Calcium: 9.3 mg/dL (ref 8.4–10.5)
Chloride: 102 mEq/L (ref 96–112)
Creatinine, Ser: 0.9 mg/dL (ref 0.4–1.2)
GFR: 66.93 mL/min (ref >60.00)
GLUCOSE: 133 mg/dL — AB (ref 70–99)
POTASSIUM: 3.7 meq/L (ref 3.5–5.1)
Sodium: 138 mEq/L (ref 135–145)
Total Bilirubin: 0.8 mg/dL (ref 0.3–1.2)
Total Protein: 8.1 g/dL (ref 6.0–8.3)

## 2013-06-05 LAB — LDL CHOLESTEROL, DIRECT: Direct LDL: 121.3 mg/dL

## 2013-06-05 LAB — URIC ACID: Uric Acid, Serum: 8 mg/dL — ABNORMAL HIGH (ref 2.4–7.0)

## 2013-06-05 NOTE — Assessment & Plan Note (Signed)
Contemplating surgery 

## 2013-06-05 NOTE — Assessment & Plan Note (Signed)
Marked ataxia with tandem gait but no complaint of dizziness.  Plan Will notify Dr. Leonie Man as to whether there is a need for any additional diagnostic testing  She is to use her walker at all times.

## 2013-06-05 NOTE — Progress Notes (Signed)
Pre visit review using our clinic review tool, if applicable. No additional management support is needed unless otherwise documented below in the visit note. 

## 2013-06-05 NOTE — Assessment & Plan Note (Signed)
BP Readings from Last 3 Encounters:  06/05/13 142/98  03/01/13 142/83  01/09/13 142/88   Adequate control per JNC 8 guidelines  Plan  routine lab

## 2013-06-05 NOTE — Assessment & Plan Note (Addendum)
No recent flares of gout.  Plan  Uric acid level today with recommendations to follow.  Addendum - Uric Acid 8.0 (H) - in the absence of more than 2 gout flares/12 months will hold off starting allopurinol.

## 2013-06-05 NOTE — Patient Instructions (Signed)
Good to see you  Exam is ok except for problem with tandem gait - don't get stopped by HP. Use your rolling walker AT ALL TIMES!!!!! I will notify Dr. Leonie Man of the gait issue.   Labs ordered and results will be posted to [facility] maintenance is up to date.

## 2013-06-05 NOTE — Assessment & Plan Note (Signed)
Stable with no recent flares °

## 2013-06-05 NOTE — Progress Notes (Signed)
Subjective:    Patient ID: Kristy [name], female    DOB: [birth year], 68 y.o.   MRN: [id number]  HPI The patient is here for annual Medicare wellness examination and management of other chronic and acute problems.  She has seen Dr. Toney Rakes for gyn: normal exam; mammogram was done Dec '14; DEXA -    The risk factors are reflected in the social history.  The roster of all physicians providing medical care to patient - is listed in the Snapshot section of the chart.  Activities of daily living:  The patient is 100% inedpendent in all ADLs: dressing, toileting, feeding as well as independent mobility  Home safety : The patient has smoke detectors in the home. Falls - 3-4 falls w/o injury. Home is fall safe. This is a balance issue.  They wear seatbelts.   firearms are present in the home, kept in a safe fashion. There is no violence in the home.   There is no risks for hepatitis, STDs or HIV. There is no history of blood transfusion. They have no travel history to infectious disease endemic areas of the world.  The patient has seen their dentist in the last six month. They have not seen their eye doctor in the last year. They deny any hearing difficulty and have not had audiologic testing in the last year.    They do not  have excessive sun exposure. Discussed the need for sun protection: hats, long sleeves and use of sunscreen if there is significant sun exposure.   Diet: the importance of a healthy diet is discussed. They do have a mostly healthy diet.  The patient has a regular exercise program: stretching w/ machines, 45 min duration, 3 per week.  The benefits of regular aerobic exercise were discussed.  Depression screen: there are no signs or vegative symptoms of depression- irritability, change in appetite, anhedonia, sadness/tearfullness.  Cognitive assessment: the patient manages all their financial and personal affairs and is actively engaged.   The following portions of the  patient's history were reviewed and updated as appropriate: allergies, current medications, past family history, past medical history,  past surgical history, past social history  and problem list.  Vision, hearing, body mass index were assessed and reviewed.   During the course of the visit the patient was educated and counseled about appropriate screening and preventive services including : fall prevention , diabetes screening, nutrition counseling, colorectal cancer screening, and recommended immunizations.  Past Medical History  Diagnosis Date  . Asthma   . Hypertension   . Hiatal hernia   . DUB (dysfunctional uterine bleeding)   . SUI (stress urinary incontinence, female)   . Atrophic vaginitis   . Fibroid   . Fibromyalgia   . Reflux   . Shortness of breath     occ  . GERD (gastroesophageal reflux disease)   . Arthritis     generalized joint pain, including back  . TIA (transient ischemic attack)     07/2012   Past Surgical History  Procedure Laterality Date  . Abdominal hysterectomy  1994    TAH,BSO-BLADDER NECK SUSPENSION  . Surgery for poserior tibial tendon  2003  . Cystocele repair  1994  . Cholecystectomy, laparoscopic  2001  . Tubal ligation    . Carpal tunnel release  2005  . Triple fusion left ankle  2007  . Oophorectomy      BSO  . Breast surgery      LUMPECTOMY-BENIGN Breast Bx X2  .  Tonsillectomy    . Knee arthroscopy      bil  . Knee arthroplasty  05/19/2012    Procedure: COMPUTER ASSISTED TOTAL KNEE ARTHROPLASTY;  Surgeon: Marybelle Killings, MD;  Location: [facility];  Service: Orthopedics;  Laterality: Left;  Left  Total Knee Arthroplasty   Family History  Problem Relation Age of Onset  . Breast cancer Mother   . Breast cancer Sister 57    postitive gene BRCA2  . Hypertension Sister   . Cancer Father     Brain tumor  . Cancer Maternal Aunt   . Cancer Maternal Uncle   . Hemochromatosis Sister   . Thalassemia Sister   . Other Sister     negative for  BRCA1 BRCA2  . Breast cancer Cousin     postive gene   History   Social History  . Marital Status: Married    Spouse Name: [name]    Number of Children: 1  . 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 Smoking status: Never Smoker   . Smokeless tobacco: Never Used  . Alcohol Use: No     Comment: rare  . Drug Use: No  . Sexual Activity: No   Other Topics Concern  . 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 married '69. 2 sons - '70, '72: retired - [employer]. SO- good health.   ACP - OK for CPR at least once, no long term ventilation, no heroic measures in the face of poor quality of life. HCPOA - husband, secondary son Contina Strain (c) [phone]   Caffeine Use: Occasionally   Current Outpatient Prescriptions on File Prior to Visit  Medication Sig Dispense Refill  . aspirin 325 MG tablet Take 1 tablet (325 mg total) by mouth daily.      . Azelastine HCl (ASTEPRO) 0.15 % SOLN Place 2 sprays into the nose daily.      . beclomethasone (QVAR) 80 MCG/ACT inhaler Inhale 1 puff into the lungs 2 (two) times daily.      Marland Kitchen diltiazem (CARDIZEM CD) 240 MG 24 hr capsule TAKE 1 CAPSULE (240 MG TOTAL) BY MOUTH DAILY.  30 capsule  6  . EPINEPHrine (EPIPEN) 0.3 mg/0.3 mL DEVI Inject 0.3 mg into the muscle as needed. For allergic reactions      . furosemide (LASIX) 40 MG tablet TAKE 1 TABLET (40 MG TOTAL) BY MOUTH DAILY.  30 tablet  6  . levalbuterol (XOPENEX HFA) 45 MCG/ACT inhaler Inhale 2 puffs into the lungs every 4 (four) hours as needed. For shortness of breath      . levocetirizine (XYZAL) 5 MG tablet Take 5 mg by mouth daily.      Marland Kitchen losartan (COZAAR) 50 MG tablet TAKE 1 TABLET EVERY MORNING  30 tablet  5  . NONFORMULARY OR COMPOUNDED ITEM Estradiol .02% 1 ML Prefilled Applicator Sig: apply vaginally twice a week #90 Day Supply with 4 refills  1 each  4  . nortriptyline (PAMELOR) 25 MG capsule Take 25  mg by mouth at bedtime.        . Olopatadine HCl (PATADAY) 0.2 % SOLN Place 1 drop into both eyes daily.      Marland Kitchen omeprazole (PRILOSEC) 40 MG capsule TAKE ONE CAPSULE BY MOUTH EVERY MORNING  30 capsule  11  . Vitamin D, Ergocalciferol, (DRISDOL) 50000 UNITS CAPS capsule Take 1 capsule (50,000  Units total) by mouth every 7 (seven) days.  12 capsule  0  . vitamin E 400 UNIT capsule Take 400 Units by mouth daily.       No current facility-administered medications on file prior to visit.      Review of Systems Constitutional:  Negative for fever, chills, activity change and unexpected weight change.  HEENT:  Negative for hearing loss, ear pain, congestion, neck stiffness and postnasal drip. Negative for sore throat or swallowing problems. Negative for dental complaints.   Eyes: Negative for vision loss or change in visual acuity.  Respiratory: Negative for chest tightness and wheezing. Negative for DOE.   Cardiovascular: Positive for chest pain which has been fully evaluated and determined to be non-cardiac. no palpitations. No decreased exercise tolerance Gastrointestinal: change in bowel habit - more constipation despite use of fiber. No bloating or gas. No reflux or indigestion Genitourinary: Negative for urgency, frequency, flank pain and difficulty urinating.  Musculoskeletal: Positive for myalgias arthralgias right leg and kneeand gait problem due to balance.  Neurological: Negative for dizziness, tremors, weakness and headaches. Balance issues Hematological: Negative for adenopathy.  Psychiatric/Behavioral: Negative for behavioral problems and dysphoric mood.       Objective:   Physical Exam Filed Vitals:   06/05/13 1001  BP: 142/98  Pulse: 104  Temp: 96.9 F (36.1 C)   Wt Readings from Last 3 Encounters:  06/05/13 208 lb (94.348 kg)  03/01/13 212 lb (96.163 kg)  01/09/13 211 lb (95.709 kg)   BP Readings from Last 3 Encounters:  06/05/13 142/98  03/01/13 142/83  01/09/13  142/88   Gen'l WNWD woman in no distress HEENT- [place]/AT, C&S clear, TMs normal, no oropharyngeal lesions, throat clear Neck supple, no thyromegaly Nodes - negative Cor 2_+ radial DP, PT, RRR w/o mm/r/g PUlm - normal respirations Abd- obese, soft, no guarding or rebound Pelvic/ rectal - deferred to gyn Ext - enlarged right knee otherwise normal Neuro - A&O x 3, Speech clear, cognition normal; CN II-XII normal - face symmetrical with normal movement, EOMI; cerebellar - normal Finger-nose, heel-shin, rapid finger movement, no dysdiadochokinesia. Gait - fairly good, cannot tandem gait. Derm clear  Recent Results (from the past 2160 hour(s))  VITAMIN D 25 HYDROXY     Status: Abnormal   Collection Time    04/20/13 11:38 AM      Result Value Range   Vit D, 25-Hydroxy 23 (*) 30 - 89 ng/mL   Comment: This assay accurately quantifies Vitamin D, which is the sum of the     25-Hydroxy forms of Vitamin D2 and D3.  Studies have shown that the     optimum concentration of 25-Hydroxy Vitamin D is 30 ng/mL or higher.      Concentrations of Vitamin D between 20 and 29 ng/mL are considered to     be insufficient and concentrations less than 20 ng/mL are considered     to be deficient for Vitamin D.  PTH, INTACT AND CALCIUM     Status: Abnormal   Collection Time    04/20/13 11:38 AM      Result Value Range   PTH 125.0 (*) 14.0 - 72.0 pg/mL   Calcium 9.8  8.4 - 10.5 mg/dL  LIPID PANEL     Status: Abnormal   Collection Time    06/05/13 11:08 AM      Result Value Range   Cholesterol 189  0 - 200 mg/dL   Comment: ATP III Classification  Desirable:  < 200 mg/dL               Borderline High:  200 - 239 mg/dL          High:  > = 240 mg/dL   Triglycerides 241.0 (*) 0.0 - 149.0 mg/dL   Comment: Normal:  <150 mg/dLBorderline High:  150 - 199 mg/dL   HDL 33.40 (*) >39.00 mg/dL   VLDL 48.2 (*) 0.0 - 40.0 mg/dL   Total CHOL/HDL Ratio 6     Comment:                Men          Women1/2 Average Risk      3.4          3.3Average Risk          5.0          4.42X Average Risk          9.6          7.13X Average Risk          15.0          11.0                      COMPREHENSIVE METABOLIC PANEL     Status: Abnormal   Collection Time    06/05/13 11:08 AM      Result Value Range   Sodium 138  135 - 145 mEq/L   Potassium 3.7  3.5 - 5.1 mEq/L   Chloride 102  96 - 112 mEq/L   CO2 23  19 - 32 mEq/L   Glucose, Bld 133 (*) 70 - 99 mg/dL   BUN 14  6 - 23 mg/dL   Creatinine, Ser 0.9  0.4 - 1.2 mg/dL   Total Bilirubin 0.8  0.3 - 1.2 mg/dL   Alkaline Phosphatase 84  39 - 117 U/L   AST 27  0 - 37 U/L   ALT 40 (*) 0 - 35 U/L   Total Protein 8.1  6.0 - 8.3 g/dL   Albumin 4.7  3.5 - 5.2 g/dL   Calcium 9.3  8.4 - 10.5 mg/dL   GFR 66.93  >60.00 mL/min  URIC ACID     Status: Abnormal   Collection Time    06/05/13 11:08 AM      Result Value Range   Uric Acid, Serum 8.0 (*) 2.4 - 7.0 mg/dL  HEMOGLOBIN A1C     Status: Abnormal   Collection Time    06/05/13 11:08 AM      Result Value Range   Hemoglobin A1C 6.7 (*) 4.6 - 6.5 %   Comment: Glycemic Control Guidelines for People with Diabetes:Non Diabetic:  <6%Goal of Therapy: <7%Additional Action Suggested:  >8%   LDL CHOLESTEROL, DIRECT     Status: None   Collection Time    06/05/13 11:08 AM      Result Value Range   Direct LDL 121.3     Comment: Optimal:  <100 mg/dLNear or Above Optimal:  100-129 mg/dLBorderline High:  130-159 mg/dLHigh:  160-189 mg/dLVery High:  >190 mg/dL         Assessment & Plan:

## 2013-06-05 NOTE — Assessment & Plan Note (Signed)
Stable with good pain control w/o narcotics

## 2013-06-06 DIAGNOSIS — J309 Allergic rhinitis, unspecified: Secondary | ICD-10-CM | POA: Diagnosis not present

## 2013-06-06 DIAGNOSIS — M8580 Other specified disorders of bone density and structure, unspecified site: Secondary | ICD-10-CM | POA: Insufficient documentation

## 2013-06-06 LAB — PTH, INTACT AND CALCIUM
CALCIUM: 9.6 mg/dL (ref 8.4–10.5)
PTH: 136.4 pg/mL — AB (ref 14.0–72.0)

## 2013-06-06 NOTE — Assessment & Plan Note (Signed)
Dr. Toney Rakes ordered Vit D  Which was low at 23. Patient completing 12 weeks of Cholcalciferol 50,000/wk. She also had an elevated total PTH.  Plan  maintenance therapy after completing high dose Vit D of 1,000 iu daily  Follow up elevated PTH with iPTH with ionized calcium to r/o hyperparathyroidism.

## 2013-06-06 NOTE — Assessment & Plan Note (Signed)
Interval history is benign. Limited physical exam is normal. Lab results reviewed - in normal range She is current with gyn and colorectal cancer screening. She is current with mammography - Dec '14. Immunizations are up to date.  In summary A nice woman who is medically stable with up to date health maintenance.

## 2013-06-08 ENCOUNTER — Encounter: Payer: Self-pay | Admitting: Internal Medicine

## 2013-06-11 ENCOUNTER — Encounter: Payer: Self-pay | Admitting: Internal Medicine

## 2013-06-11 ENCOUNTER — Ambulatory Visit (INDEPENDENT_AMBULATORY_CARE_PROVIDER_SITE_OTHER): Payer: Medicare Other | Admitting: Internal Medicine

## 2013-06-11 VITALS — BP 130/90 | HR 102 | Temp 97.0°F | Wt 208.0 lb

## 2013-06-11 DIAGNOSIS — E7800 Pure hypercholesterolemia, unspecified: Secondary | ICD-10-CM | POA: Diagnosis not present

## 2013-06-11 DIAGNOSIS — E66811 Obesity, class 1: Secondary | ICD-10-CM | POA: Insufficient documentation

## 2013-06-11 DIAGNOSIS — E669 Obesity, unspecified: Secondary | ICD-10-CM | POA: Diagnosis not present

## 2013-06-11 NOTE — Patient Instructions (Signed)
1. Elevated cholesterol - you CAN do this with diet and exercise regimen (already doing the exercise).  Plan Low fat diet  Repeat lab in 3 months - if not at goal of LDL 100 or less will need to consider medical therapy  2. Weight management - Diet management: smart food choices, PORTION SIZE CONTROL, regular exercise. Goal - to loose 1-2 lbs.month. Target weight - 175 lbs ( 33lbs over 15-30 months). This has tremendous long-term positive benefit for your overall health.

## 2013-06-11 NOTE — Assessment & Plan Note (Signed)
Weight management - Diet management: smart food choices, PORTION SIZE CONTROL, regular exercise. Goal - to loose 1-2 lbs.month. Target weight - 175 lbs ( 33lbs over 15-30 months). This has tremendous long-term positive benefit for your overall health.

## 2013-06-11 NOTE — Assessment & Plan Note (Signed)
Elevated cholesterol - you CAN do this with diet and exercise regimen (already doing the exercise).  Plan Low fat diet  Repeat lab in 3 months - if not at goal of LDL 100 or less will need to consider medical therapy

## 2013-06-11 NOTE — Progress Notes (Signed)
   Subjective:    Patient ID: Kristy [name], female    DOB: March 22, 1946, 68 y.o.   MRN: [id number]  HPI Kristy [name] presents to discuss lab results - elevated LDL cholesterol above goal of 100 or less. She admits to dietary indiscretion - not on a strict low fat diet. She does exercise on a regular basis.  PMH, FamHx and SocHx reviewed for any changes and relevance.  Current Outpatient Prescriptions on File Prior to Visit  Medication Sig Dispense Refill  . aspirin 325 MG tablet Take 1 tablet (325 mg total) by mouth daily.      . Azelastine HCl (ASTEPRO) 0.15 % SOLN Place 2 sprays into the nose daily.      . beclomethasone (QVAR) 80 MCG/ACT inhaler Inhale 1 puff into the lungs 2 (two) times daily.      Marland Kitchen diltiazem (CARDIZEM CD) 240 MG 24 hr capsule TAKE 1 CAPSULE (240 MG TOTAL) BY MOUTH DAILY.  30 capsule  6  . EPINEPHrine (EPIPEN) 0.3 mg/0.3 mL DEVI Inject 0.3 mg into the muscle as needed. For allergic reactions      . furosemide (LASIX) 40 MG tablet TAKE 1 TABLET (40 MG TOTAL) BY MOUTH DAILY.  30 tablet  6  . levalbuterol (XOPENEX HFA) 45 MCG/ACT inhaler Inhale 2 puffs into the lungs every 4 (four) hours as needed. For shortness of breath      . levocetirizine (XYZAL) 5 MG tablet Take 5 mg by mouth daily.      Marland Kitchen losartan (COZAAR) 50 MG tablet TAKE 1 TABLET EVERY MORNING  30 tablet  5  . NONFORMULARY OR COMPOUNDED ITEM Estradiol .02% 1 ML Prefilled Applicator Sig: apply vaginally twice a week #90 Day Supply with 4 refills  1 each  4  . nortriptyline (PAMELOR) 25 MG capsule Take 25 mg by mouth at bedtime.        . Olopatadine HCl (PATADAY) 0.2 % SOLN Place 1 drop into both eyes daily.      Marland Kitchen omeprazole (PRILOSEC) 40 MG capsule TAKE ONE CAPSULE BY MOUTH EVERY MORNING  30 capsule  11  . Vitamin D, Ergocalciferol, (DRISDOL) 50000 UNITS CAPS capsule Take 1 capsule (50,000 Units total) by mouth every 7 (seven) days.  12 capsule  0  . vitamin E 400 UNIT capsule Take 400 Units by mouth daily.        No current facility-administered medications on file prior to visit.      Review of Systems The patient denies anorexia, fever, weight loss,, vision loss, decreased hearing, hoarseness, chest pain, syncope, dyspnea on exertion, peripheral edema, balance deficits, hemoptysis, abdominal pain, melena, hematochezia, severe indigestion/heartburn, hematuria, incontinence, genital sores, muscle weakness, suspicious skin lesions, transient blindness, difficulty walking, depression, unusual weight change, abnormal bleeding, enlarged lymph nodes, angioedema, and breast masses.     Objective:   Physical Exam Filed Vitals:   06/11/13 0822  BP: 130/90  Pulse: 102  Temp: 97 F (36.1 C)   Wt Readings from Last 3 Encounters:  06/11/13 208 lb (94.348 kg)  06/05/13 208 lb (94.348 kg)  03/01/13 212 lb (96.163 kg)   Gen'l- WNWD, overweight woman in no distress Cor - 2+ radial Pulm - normal respirations Neuro - A&O x 3       Assessment & Plan:

## 2013-06-11 NOTE — Progress Notes (Signed)
Pre visit review using our clinic review tool, if applicable. No additional management support is needed unless otherwise documented below in the visit note. 

## 2013-06-20 DIAGNOSIS — J309 Allergic rhinitis, unspecified: Secondary | ICD-10-CM | POA: Diagnosis not present

## 2013-06-20 DIAGNOSIS — S058X9A Other injuries of unspecified eye and orbit, initial encounter: Secondary | ICD-10-CM | POA: Diagnosis not present

## 2013-06-28 DIAGNOSIS — J309 Allergic rhinitis, unspecified: Secondary | ICD-10-CM | POA: Diagnosis not present

## 2013-07-09 DIAGNOSIS — J3089 Other allergic rhinitis: Secondary | ICD-10-CM | POA: Diagnosis not present

## 2013-07-09 DIAGNOSIS — J3081 Allergic rhinitis due to animal (cat) (dog) hair and dander: Secondary | ICD-10-CM | POA: Diagnosis not present

## 2013-07-09 DIAGNOSIS — J45909 Unspecified asthma, uncomplicated: Secondary | ICD-10-CM | POA: Diagnosis not present

## 2013-07-09 DIAGNOSIS — J309 Allergic rhinitis, unspecified: Secondary | ICD-10-CM | POA: Diagnosis not present

## 2013-07-09 DIAGNOSIS — J301 Allergic rhinitis due to pollen: Secondary | ICD-10-CM | POA: Diagnosis not present

## 2013-07-15 ENCOUNTER — Other Ambulatory Visit: Payer: Self-pay | Admitting: Internal Medicine

## 2013-07-19 ENCOUNTER — Other Ambulatory Visit: Payer: Medicare Other

## 2013-07-19 DIAGNOSIS — E559 Vitamin D deficiency, unspecified: Secondary | ICD-10-CM

## 2013-07-19 DIAGNOSIS — J309 Allergic rhinitis, unspecified: Secondary | ICD-10-CM | POA: Diagnosis not present

## 2013-07-20 LAB — VITAMIN D 25 HYDROXY (VIT D DEFICIENCY, FRACTURES): Vit D, 25-Hydroxy: 42 ng/mL (ref 30–89)

## 2013-07-25 DIAGNOSIS — J309 Allergic rhinitis, unspecified: Secondary | ICD-10-CM | POA: Diagnosis not present

## 2013-08-01 DIAGNOSIS — J309 Allergic rhinitis, unspecified: Secondary | ICD-10-CM | POA: Diagnosis not present

## 2013-08-09 DIAGNOSIS — J309 Allergic rhinitis, unspecified: Secondary | ICD-10-CM | POA: Diagnosis not present

## 2013-08-10 ENCOUNTER — Other Ambulatory Visit: Payer: Self-pay | Admitting: Dermatology

## 2013-08-10 DIAGNOSIS — D485 Neoplasm of uncertain behavior of skin: Secondary | ICD-10-CM | POA: Diagnosis not present

## 2013-08-10 DIAGNOSIS — L820 Inflamed seborrheic keratosis: Secondary | ICD-10-CM | POA: Diagnosis not present

## 2013-08-13 ENCOUNTER — Other Ambulatory Visit: Payer: Self-pay | Admitting: Internal Medicine

## 2013-08-13 DIAGNOSIS — M1A00X0 Idiopathic chronic gout, unspecified site, without tophus (tophi): Secondary | ICD-10-CM | POA: Diagnosis not present

## 2013-08-13 DIAGNOSIS — IMO0001 Reserved for inherently not codable concepts without codable children: Secondary | ICD-10-CM | POA: Diagnosis not present

## 2013-08-13 DIAGNOSIS — M159 Polyosteoarthritis, unspecified: Secondary | ICD-10-CM | POA: Diagnosis not present

## 2013-08-20 DIAGNOSIS — J309 Allergic rhinitis, unspecified: Secondary | ICD-10-CM | POA: Diagnosis not present

## 2013-08-21 ENCOUNTER — Other Ambulatory Visit (HOSPITAL_COMMUNITY): Payer: Self-pay | Admitting: Orthopaedic Surgery

## 2013-08-22 ENCOUNTER — Encounter (HOSPITAL_COMMUNITY): Payer: Self-pay | Admitting: Pharmacy Technician

## 2013-08-22 NOTE — H&P (Signed)
TOTAL KNEE ADMISSION H&P  Patient is being admitted for left total knee arthroplasty.  Subjective:  Chief Complaint:left knee pain.  HPI: Kristy [name], 68 y.o. female, has a history of pain and functional disability in the left knee due to arthritis and has failed non-surgical conservative treatments for greater than 12 weeks to includeNSAID's and/or analgesics, corticosteriod injections and activity modification.  Onset of symptoms was gradual, starting 3 years ago with rapidlly worsening course since that time. The patient noted no past surgery on the left knee(s).  Patient currently rates pain in the left knee(s) at 7 out of 10 with activity. Patient has worsening of pain with activity and weight bearing and pain that interferes with activities of daily living.  Patient has evidence of subchondral sclerosis, periarticular osteophytes and joint space narrowing by imaging studies. This patient has had total knee replacement right knee and did well.  There is no active infection.  Patient Active Problem List   Diagnosis Date Noted  . Osteoarthritis of left knee 05/19/2012    Priority: High    Class: Diagnosis of  . Elevated LDL cholesterol level 06/11/2013  . Obesity, Class I, BMI 30.0-34.9 (see actual BMI) 06/11/2013  . Osteopenia 06/06/2013  . Menopause 01/09/2013  . TIA (transient ischemic attack) 07/15/2012  . Routine health maintenance 05/06/2011  . SUI (stress urinary incontinence, female)   . Atrophic vaginitis   . Fibroid   . GOUT, UNSPECIFIED 04/28/2010  . Other abnormal glucose 04/28/2010  . LABYRINTHITIS, CHRONIC 07/30/2008  . HEARING LOSS 07/30/2008  . CHEST PAIN, EXERTIONAL 02/13/2008  . HYPERTENSION 08/19/2007  . ALLERGIC RHINITIS 08/19/2007  . ASTHMA 08/19/2007  . ACID REFLUX DISEASE 08/19/2007  . HIATAL HERNIA 08/19/2007  . IRRITABLE BOWEL SYNDROME 08/19/2007  . DEGENERATIVE DISC DISEASE 08/19/2007  . FIBROMYALGIA 08/19/2007  . FATIGUE, CHRONIC 08/19/2007    Past Medical History  Diagnosis Date  . Asthma   . Hypertension   . Hiatal hernia   . DUB (dysfunctional uterine bleeding)   . SUI (stress urinary incontinence, female)   . Atrophic vaginitis   . Fibroid   . Fibromyalgia   . Reflux   . Shortness of breath     occ  . GERD (gastroesophageal reflux disease)   . Arthritis     generalized joint pain, including back  . TIA (transient ischemic attack)     07/2012    Past Surgical History  Procedure Laterality Date  . Abdominal hysterectomy  1994    TAH,BSO-BLADDER NECK SUSPENSION  . Surgery for poserior tibial tendon  2003  . Cystocele repair  1994  . Cholecystectomy, laparoscopic  2001  . Tubal ligation    . Carpal tunnel release  2005  . Triple fusion left ankle  2007  . Oophorectomy      BSO  . Breast surgery      LUMPECTOMY-BENIGN Breast Bx X2  . Tonsillectomy    . Knee arthroscopy      bil  . Knee arthroplasty  05/19/2012    Procedure: COMPUTER ASSISTED TOTAL KNEE ARTHROPLASTY;  Surgeon: Marybelle Killings, MD;  Location: [facility];  Service: Orthopedics;  Laterality: Left;  Left  Total Knee Arthroplasty    No prescriptions prior to admission   Allergies  Allergen Reactions  . Iodine Anaphylaxis  . Shellfish Allergy Anaphylaxis  . Sulfonamide Derivatives Rash  . Other Swelling    Citrus  Sometimes Lactose intolerant  . Amoxicillin Diarrhea    History  Substance Use Topics  .  Smoking status: Never Smoker   . Smokeless tobacco: Never Used  . Alcohol Use: No     Comment: rare    Family History  Problem Relation Age of Onset  . Breast cancer Mother   . Breast cancer Sister 59    postitive gene BRCA2  . Hypertension Sister   . Cancer Father     Brain tumor  . Cancer Maternal Aunt   . Cancer Maternal Uncle   . Hemochromatosis Sister   . Thalassemia Sister   . Other Sister     negative for BRCA1 BRCA2  . Breast cancer Cousin     postive gene     Review of Systems  Musculoskeletal: Positive for joint pain.        Left knee  All other systems reviewed and are negative.    Objective:  Physical Exam  Constitutional: She is oriented to person, place, and time. She appears well-developed and well-nourished.  HENT:  Head: Normocephalic and atraumatic.  Eyes: EOM are normal. Pupils are equal, round, and reactive to light.  Neck: Normal range of motion.  Cardiovascular: Normal rate.   Respiratory: Effort normal.  GI: Soft.  Musculoskeletal:    Left total knee arthroplasty incision.  She has full extension.  Good stability.  Negative anterior drawer.  Collateral ligaments are balanced and she flexes to 120 degrees.   Hip range of motion is full.  Distal pulses are 2+.  Sciatic nerve function is intact.  Right knee shows stable collateral ligaments.  Medial joint line tenderness.  Palpable osteophytes medially and laterally and crepitus with range of motion, 2+ knee effusion.  Neurological: She is alert and oriented to person, place, and time.  Skin: Skin is warm and dry.    Vital signs in last 24 hours:    Labs:   Estimated body mass index is 34.61 kg/(m^2) as calculated from the following:   Height as of 06/05/13: '5\' 5"'  (1.651 m).   Weight as of 06/11/13: 94.348 kg (208 lb).   Imaging Review Plain radiographs demonstrate severe degenerative joint disease of the left knee(s). The overall alignment issignificant varus. The bone quality appears to be adequate for age and reported activity level.  Assessment/Plan:  End stage arthritis, left knee   The patient history, physical examination, clinical judgment of the provider and imaging studies are consistent with end stage degenerative joint disease of the left knee(s) and total knee arthroplasty is deemed medically necessary. The treatment options including medical management, injection therapy arthroscopy and arthroplasty were discussed at length. The risks and benefits of total knee arthroplasty were presented and reviewed. The risks due to  aseptic loosening, infection, stiffness, patella tracking problems, thromboembolic complications and other imponderables were discussed. The patient acknowledged the explanation, agreed to proceed with the plan and consent was signed. Patient is being admitted for inpatient treatment for surgery, pain control, PT, OT, prophylactic antibiotics, VTE prophylaxis, progressive ambulation and ADL's and discharge planning. The patient is planning to be discharged home with home health services

## 2013-08-24 ENCOUNTER — Encounter (HOSPITAL_COMMUNITY): Payer: Self-pay

## 2013-08-24 ENCOUNTER — Encounter (HOSPITAL_COMMUNITY)
Admission: RE | Admit: 2013-08-24 | Discharge: 2013-08-24 | Disposition: A | Discharge status: Home / Self Care | Payer: Medicare Other | Source: Ambulatory Visit | Attending: Orthopaedic Surgery | Admitting: Orthopaedic Surgery

## 2013-08-24 ENCOUNTER — Ambulatory Visit (HOSPITAL_COMMUNITY)
Admission: RE | Admit: 2013-08-24 | Discharge: 2013-08-24 | Disposition: A | Discharge status: Home / Self Care | Payer: Medicare Other | Source: Ambulatory Visit | Attending: Orthopaedic Surgery | Admitting: Orthopaedic Surgery

## 2013-08-24 DIAGNOSIS — Z01818 Encounter for other preprocedural examination: Secondary | ICD-10-CM | POA: Diagnosis not present

## 2013-08-24 DIAGNOSIS — Z01812 Encounter for preprocedural laboratory examination: Secondary | ICD-10-CM | POA: Insufficient documentation

## 2013-08-24 DIAGNOSIS — Z01810 Encounter for preprocedural cardiovascular examination: Secondary | ICD-10-CM | POA: Insufficient documentation

## 2013-08-24 HISTORY — DX: Cerebral infarction, unspecified: I63.9

## 2013-08-24 HISTORY — DX: Personal history of urinary calculi: Z87.442

## 2013-08-24 HISTORY — DX: Dizziness and giddiness: R42

## 2013-08-24 LAB — COMPREHENSIVE METABOLIC PANEL
ALBUMIN: 4.2 g/dL (ref 3.5–5.2)
ALK PHOS: 102 U/L (ref 39–117)
ALT: 53 U/L — AB (ref 0–35)
AST: 29 U/L (ref 0–37)
BUN: 11 mg/dL (ref 6–23)
CO2: 25 mEq/L (ref 19–32)
Calcium: 9.5 mg/dL (ref 8.4–10.5)
Chloride: 98 mEq/L (ref 96–112)
Creatinine, Ser: 0.76 mg/dL (ref 0.50–1.10)
GFR calc Af Amer: >90 mL/min (ref >90)
GFR calc non Af Amer: 85 mL/min — ABNORMAL LOW (ref >90)
Glucose, Bld: 118 mg/dL — ABNORMAL HIGH (ref 70–99)
POTASSIUM: 4 meq/L (ref 3.7–5.3)
SODIUM: 141 meq/L (ref 137–147)
Total Bilirubin: 0.6 mg/dL (ref 0.3–1.2)
Total Protein: 7.9 g/dL (ref 6.0–8.3)

## 2013-08-24 LAB — URINALYSIS, ROUTINE W REFLEX MICROSCOPIC
Bilirubin Urine: NEGATIVE
GLUCOSE, UA: NEGATIVE mg/dL
HGB URINE DIPSTICK: NEGATIVE
Ketones, ur: NEGATIVE mg/dL
Nitrite: NEGATIVE
PH: 5 (ref 5.0–8.0)
Protein, ur: NEGATIVE mg/dL
SPECIFIC GRAVITY, URINE: 1.012 (ref 1.005–1.030)
UROBILINOGEN UA: 0.2 mg/dL (ref 0.0–1.0)

## 2013-08-24 LAB — URINE MICROSCOPIC-ADD ON

## 2013-08-24 LAB — CBC
HCT: 46.3 % — ABNORMAL HIGH (ref 36.0–46.0)
Hemoglobin: 16 g/dL — ABNORMAL HIGH (ref 12.0–15.0)
MCH: 29.2 pg (ref 26.0–34.0)
MCHC: 34.6 g/dL (ref 30.0–36.0)
MCV: 84.5 fL (ref 78.0–100.0)
Platelets: 267 10*3/uL (ref 150–400)
RBC: 5.48 MIL/uL — ABNORMAL HIGH (ref 3.87–5.11)
RDW: 13.1 % (ref 11.5–15.5)
WBC: 11.3 10*3/uL — AB (ref 4.0–10.5)

## 2013-08-24 LAB — TYPE AND SCREEN
ABO/RH(D): B POS
ANTIBODY SCREEN: NEGATIVE

## 2013-08-24 LAB — SURGICAL PCR SCREEN
MRSA, PCR: NEGATIVE
Staphylococcus aureus: NEGATIVE

## 2013-08-24 LAB — PROTIME-INR
INR: 0.95 (ref 0.00–1.49)
PROTHROMBIN TIME: 12.5 s (ref 11.6–15.2)

## 2013-08-24 LAB — APTT: APTT: 27 s (ref 24–37)

## 2013-08-24 NOTE — Pre-Procedure Instructions (Addendum)
BURKLEY DECH  08/24/2013   Your procedure is scheduled on:  Monday April 6  Report to [facility] Main Entrance "A" at 10:30 AM.  Call this number if you have problems the morning of surgery: [phone]   Remember:   Do not eat food or drink liquids after midnight.   Take these medicines the morning of surgery with A SIP OF WATER: astepro nose spray, qvar inhaler-bring to hospital, cardizen CD, xopenex if needed- bring to hospital, pataday eye drops, prilosec, xyzal if needed        STOP all herbel meds, nsaids (aleve,naproxen,advil,ibuprofen) 5 days prior to surgery including vitamin,aspirin, vit e   Do not wear jewelry, make-up or nail polish.  Do not wear lotions, powders, or perfumes. You may wear deodorant.  Do not shave 48 hours prior to surgery. Men may shave face and neck.  Do not bring valuables to the hospital.  [facility] is not responsible  for any belongings or valuables.               Contacts, dentures or bridgework may not be worn into surgery.  Leave suitcase in the car. After surgery it may be brought to your room.  For patients admitted to the hospital, discharge time is determined by your  treatment team.               Patients discharged the day of surgery will not be allowed to drive  home.  Name and phone number of your driver:   Special Instructions:  Special Instructions:  - Preparing for Surgery  Before surgery, you can play an important role.  Because skin is not sterile, your skin needs to be as free of germs as possible.  You can reduce the number of germs on you skin by washing with CHG (chlorahexidine gluconate) soap before surgery.  CHG is an antiseptic cleaner which kills germs and bonds with the skin to continue killing germs even after washing.  Please DO NOT use if you have an allergy to CHG or antibacterial soaps.  If your skin becomes reddened/irritated stop using the CHG and inform your nurse when you arrive at Short  Stay.  Do not shave (including legs and underarms) for at least 48 hours prior to the first CHG shower.  You may shave your face.  Please follow these instructions carefully:   1.  Shower with CHG Soap the night before surgery and the morning of Surgery.  2.  If you choose to wash your hair, wash your hair first as usual with your normal shampoo.  3.  After you shampoo, rinse your hair and body thoroughly to remove the Shampoo.  4.  Use CHG as you would any other liquid soap.  You can apply chg directly  to the skin and wash gently with scrungie or a clean washcloth.  5.  Apply the CHG Soap to your body ONLY FROM THE NECK DOWN.  Do not use on open wounds or open sores.  Avoid contact with your eyes ears, mouth and genitals (private parts).  Wash genitals (private parts)       with your normal soap.  6.  Wash thoroughly, paying special attention to the area where your surgery will be performed.  7.  Thoroughly rinse your body with warm water from the neck down.  8.  DO NOT shower/wash with your normal soap after using and rinsing off the CHG Soap.  9.  Pat yourself dry with  a clean towel.            10.  Wear clean pajamas.            11.  Place clean sheets on your bed the night of your first shower and do not sleep with pets.  Day of Surgery  Do not apply any lotions/deodorants the morning of surgery.  Please wear clean clothes to the hospital/surgery center.   Please read over the following fact sheets that you were given: Pain Booklet, Coughing and Deep Breathing, Blood Transfusion Information, MRSA Information and Surgical Site Infection Prevention

## 2013-08-27 NOTE — Progress Notes (Signed)
Anesthesia chart review: Patient is a 68 year old female scheduled for right TKA on 09/03/13 by Dr. Lorin Mercy.    History includes asthma, hypertension, hiatal hernia, fibromyalgia, GERD, TIA 07/20/2012, breast lumpectomy for benign lesion, left TKA 2013, cholecystectomy, hysterectomy with BSO. BMI is 35 consistent with obesity. PCP is Dr. Adella Hare who was aware that she was contemplating knee surgery at the time of her last CPE on 06/05/13.  EKG on 08/24/13 showed ST @ 114 bpm, possible LAE. (HR was 104 at the time of her vitals).  Echo on 07/15/12 showed: - Left ventricle: The cavity size was normal. Wall thickness was normal. Systolic function was normal. The estimated ejection fraction was in the range of 55% to 60%. Wall motion was normal; there were no regional wall motion abnormalities. - Aortic valve: Mildly calcified annulus. Trileaflet. - Mitral valve: Calcified annulus. - Atrial septum: No defect or patent foramen ovale was Identified.  Carotid duplex on 07/15/12 showed: No significant extracranial carotid artery stenosis demonstrated. Vertebrals are patent with antegrade flow.  CXR on 08/24/13 showed: IMPRESSION: There is no evidence of pneumonia nor CHF. The pulmonary interstitial markings are prominent but this may be due to crowding secondary to borderline hypo inflation. A hiatal hernia is present.  Preoperative labs noted. A1C was 6.7 on 06/05/13.  If no acute changes then I would anticipate that she could proceed as planned.  [facility] Short Stay Center/Anesthesiology Phone [phone] 08/27/2013 2:57 PM

## 2013-09-02 MED ORDER — CEFAZOLIN SODIUM-DEXTROSE 2-3 GM-% IV SOLR
2.0000 g | INTRAVENOUS | Status: AC
  Administered 2013-09-03: 2 g via INTRAVENOUS
  Filled 2013-09-02: qty 50

## 2013-09-02 MED ORDER — CHLORHEXIDINE GLUCONATE 4 % EX LIQD
60.0000 mL | Freq: Once | CUTANEOUS | Status: DC
  Filled 2013-09-02: qty 60

## 2013-09-03 ENCOUNTER — Encounter (HOSPITAL_COMMUNITY)
Admission: RE | Disposition: A | Discharge status: Home Health | Payer: Self-pay | Source: Ambulatory Visit | Attending: Orthopaedic Surgery

## 2013-09-03 ENCOUNTER — Inpatient Hospital Stay (HOSPITAL_COMMUNITY)
Admission: RE | Admit: 2013-09-03 | Discharge: 2013-09-05 | DRG: 470 | Disposition: A | Discharge status: Home Health | Payer: Medicare Other | Source: Ambulatory Visit | Attending: Orthopaedic Surgery | Admitting: Orthopaedic Surgery

## 2013-09-03 ENCOUNTER — Inpatient Hospital Stay (HOSPITAL_COMMUNITY): Payer: Medicare Other | Admitting: Anesthesiology

## 2013-09-03 ENCOUNTER — Encounter (HOSPITAL_COMMUNITY): Payer: Self-pay | Admitting: *Deleted

## 2013-09-03 ENCOUNTER — Encounter (HOSPITAL_COMMUNITY): Payer: Medicare Other | Admitting: Vascular Surgery

## 2013-09-03 DIAGNOSIS — E669 Obesity, unspecified: Secondary | ICD-10-CM | POA: Diagnosis present

## 2013-09-03 DIAGNOSIS — M109 Gout, unspecified: Secondary | ICD-10-CM | POA: Diagnosis not present

## 2013-09-03 DIAGNOSIS — Z6830 Body mass index (BMI) 30.0-30.9, adult: Secondary | ICD-10-CM | POA: Diagnosis not present

## 2013-09-03 DIAGNOSIS — M25569 Pain in unspecified knee: Secondary | ICD-10-CM | POA: Diagnosis not present

## 2013-09-03 DIAGNOSIS — J45909 Unspecified asthma, uncomplicated: Secondary | ICD-10-CM | POA: Diagnosis present

## 2013-09-03 DIAGNOSIS — M1711 Unilateral primary osteoarthritis, right knee: Secondary | ICD-10-CM

## 2013-09-03 DIAGNOSIS — K219 Gastro-esophageal reflux disease without esophagitis: Secondary | ICD-10-CM | POA: Diagnosis present

## 2013-09-03 DIAGNOSIS — I10 Essential (primary) hypertension: Secondary | ICD-10-CM | POA: Diagnosis present

## 2013-09-03 DIAGNOSIS — G8918 Other acute postprocedural pain: Secondary | ICD-10-CM | POA: Diagnosis not present

## 2013-09-03 DIAGNOSIS — H9190 Unspecified hearing loss, unspecified ear: Secondary | ICD-10-CM | POA: Diagnosis present

## 2013-09-03 DIAGNOSIS — Z96659 Presence of unspecified artificial knee joint: Secondary | ICD-10-CM

## 2013-09-03 DIAGNOSIS — M1710 Unilateral primary osteoarthritis, unspecified knee: Principal | ICD-10-CM | POA: Diagnosis present

## 2013-09-03 DIAGNOSIS — Z8673 Personal history of transient ischemic attack (TIA), and cerebral infarction without residual deficits: Secondary | ICD-10-CM | POA: Diagnosis not present

## 2013-09-03 HISTORY — PX: KNEE ARTHROPLASTY: SHX992

## 2013-09-03 LAB — GLUCOSE, CAPILLARY: GLUCOSE-CAPILLARY: 111 mg/dL — AB (ref 70–99)

## 2013-09-03 SURGERY — ARTHROPLASTY, KNEE, TOTAL, USING IMAGELESS COMPUTER-ASSISTED NAVIGATION
Anesthesia: Regional | Site: Knee | Laterality: Right

## 2013-09-03 MED ORDER — ONDANSETRON HCL 4 MG PO TABS: 4.0000 mg | ORAL_TABLET | Freq: Four times a day (QID) | ORAL | Status: DC | PRN

## 2013-09-03 MED ORDER — FUROSEMIDE 40 MG PO TABS
40.0000 mg | ORAL_TABLET | Freq: Every day | ORAL | Status: DC
  Administered 2013-09-03 – 2013-09-05 (×2): 40 mg via ORAL
  Filled 2013-09-03 (×3): qty 1

## 2013-09-03 MED ORDER — OXYCODONE HCL 5 MG PO TABS
5.0000 mg | ORAL_TABLET | ORAL | Status: DC | PRN
  Administered 2013-09-04 – 2013-09-05 (×4): 10 mg via ORAL
  Filled 2013-09-03 (×4): qty 2

## 2013-09-03 MED ORDER — ONDANSETRON HCL 4 MG/2ML IJ SOLN
INTRAMUSCULAR | Status: AC
  Filled 2013-09-03: qty 2

## 2013-09-03 MED ORDER — LEVALBUTEROL TARTRATE 45 MCG/ACT IN AERO: 2.0000 | INHALATION_SPRAY | RESPIRATORY_TRACT | Status: DC | PRN

## 2013-09-03 MED ORDER — KETOROLAC TROMETHAMINE 30 MG/ML IJ SOLN
INTRAMUSCULAR | Status: AC
  Administered 2013-09-03: 15 mg
  Filled 2013-09-03: qty 1

## 2013-09-03 MED ORDER — GLYCOPYRROLATE 0.2 MG/ML IJ SOLN
INTRAMUSCULAR | Status: AC
  Filled 2013-09-03: qty 2

## 2013-09-03 MED ORDER — SENNOSIDES-DOCUSATE SODIUM 8.6-50 MG PO TABS: 1.0000 | ORAL_TABLET | Freq: Every evening | ORAL | Status: DC | PRN

## 2013-09-03 MED ORDER — METHOCARBAMOL 500 MG PO TABS
500.0000 mg | ORAL_TABLET | Freq: Four times a day (QID) | ORAL | Status: DC | PRN
  Administered 2013-09-04 – 2013-09-05 (×2): 500 mg via ORAL
  Filled 2013-09-03 (×3): qty 1

## 2013-09-03 MED ORDER — PROPOFOL 10 MG/ML IV BOLUS
INTRAVENOUS | Status: DC | PRN
  Administered 2013-09-03: 180 mg via INTRAVENOUS

## 2013-09-03 MED ORDER — PROPOFOL 10 MG/ML IV BOLUS
INTRAVENOUS | Status: AC
  Filled 2013-09-03: qty 20

## 2013-09-03 MED ORDER — ROCURONIUM BROMIDE 100 MG/10ML IV SOLN
INTRAVENOUS | Status: DC | PRN
  Administered 2013-09-03: 50 mg via INTRAVENOUS

## 2013-09-03 MED ORDER — BISACODYL 10 MG RE SUPP: 10.0000 mg | Freq: Every day | RECTAL | Status: DC | PRN

## 2013-09-03 MED ORDER — EPINEPHRINE 0.3 MG/0.3ML IJ SOAJ
0.3000 mg | INTRAMUSCULAR | Status: DC | PRN
  Filled 2013-09-03: qty 0.6

## 2013-09-03 MED ORDER — MORPHINE SULFATE 2 MG/ML IJ SOLN: 2.0000 mg | INTRAMUSCULAR | Status: DC | PRN

## 2013-09-03 MED ORDER — VITAMIN E 180 MG (400 UNIT) PO CAPS
400.0000 [IU] | ORAL_CAPSULE | Freq: Every day | ORAL | Status: DC
  Administered 2013-09-04 – 2013-09-05 (×2): 400 [IU] via ORAL
  Filled 2013-09-03 (×2): qty 1

## 2013-09-03 MED ORDER — DOCUSATE SODIUM 100 MG PO CAPS
100.0000 mg | ORAL_CAPSULE | Freq: Two times a day (BID) | ORAL | Status: DC
  Administered 2013-09-03 – 2013-09-05 (×4): 100 mg via ORAL
  Filled 2013-09-03 (×5): qty 1

## 2013-09-03 MED ORDER — LORATADINE 10 MG PO TABS
10.0000 mg | ORAL_TABLET | Freq: Every day | ORAL | Status: DC
  Administered 2013-09-04 – 2013-09-05 (×2): 10 mg via ORAL
  Filled 2013-09-03 (×2): qty 1

## 2013-09-03 MED ORDER — LIDOCAINE HCL (CARDIAC) 20 MG/ML IV SOLN
INTRAVENOUS | Status: DC | PRN
  Administered 2013-09-03: 60 mg via INTRAVENOUS

## 2013-09-03 MED ORDER — GLYCOPYRROLATE 0.2 MG/ML IJ SOLN
INTRAMUSCULAR | Status: DC | PRN
  Administered 2013-09-03: 0.4 mg via INTRAVENOUS

## 2013-09-03 MED ORDER — ACETAMINOPHEN 650 MG RE SUPP: 650.0000 mg | Freq: Four times a day (QID) | RECTAL | Status: DC | PRN

## 2013-09-03 MED ORDER — DIPHENHYDRAMINE HCL 12.5 MG/5ML PO ELIX: 12.5000 mg | ORAL_SOLUTION | ORAL | Status: DC | PRN

## 2013-09-03 MED ORDER — METOCLOPRAMIDE HCL 5 MG PO TABS
5.0000 mg | ORAL_TABLET | Freq: Three times a day (TID) | ORAL | Status: DC | PRN
  Filled 2013-09-03: qty 2

## 2013-09-03 MED ORDER — METHOCARBAMOL 500 MG PO TABS: 500.0000 mg | ORAL_TABLET | Freq: Four times a day (QID) | ORAL | Status: DC | PRN

## 2013-09-03 MED ORDER — ALBUTEROL SULFATE (2.5 MG/3ML) 0.083% IN NEBU: 2.5000 mg | INHALATION_SOLUTION | RESPIRATORY_TRACT | Status: DC | PRN

## 2013-09-03 MED ORDER — ROCURONIUM BROMIDE 50 MG/5ML IV SOLN
INTRAVENOUS | Status: AC
  Filled 2013-09-03: qty 1

## 2013-09-03 MED ORDER — FLEET ENEMA 7-19 GM/118ML RE ENEM: 1.0000 | ENEMA | Freq: Once | RECTAL | Status: AC | PRN

## 2013-09-03 MED ORDER — FENTANYL CITRATE 0.05 MG/ML IJ SOLN
INTRAMUSCULAR | Status: AC
  Filled 2013-09-03: qty 5

## 2013-09-03 MED ORDER — METOCLOPRAMIDE HCL 5 MG/ML IJ SOLN: 5.0000 mg | Freq: Three times a day (TID) | INTRAMUSCULAR | Status: DC | PRN

## 2013-09-03 MED ORDER — FENTANYL CITRATE 0.05 MG/ML IJ SOLN
INTRAMUSCULAR | Status: DC | PRN
  Administered 2013-09-03 (×2): 50 ug via INTRAVENOUS
  Administered 2013-09-03: 100 ug via INTRAVENOUS
  Administered 2013-09-03: 50 ug via INTRAVENOUS

## 2013-09-03 MED ORDER — NEOSTIGMINE METHYLSULFATE 1 MG/ML IJ SOLN
INTRAMUSCULAR | Status: AC
  Filled 2013-09-03: qty 10

## 2013-09-03 MED ORDER — AZELASTINE HCL 0.1 % NA SOLN
2.0000 | Freq: Every day | NASAL | Status: DC
  Administered 2013-09-04 – 2013-09-05 (×2): 2 via NASAL
  Filled 2013-09-03: qty 30

## 2013-09-03 MED ORDER — NORTRIPTYLINE HCL 25 MG PO CAPS
25.0000 mg | ORAL_CAPSULE | Freq: Every day | ORAL | Status: DC
  Administered 2013-09-03 – 2013-09-04 (×2): 25 mg via ORAL
  Filled 2013-09-03 (×3): qty 1

## 2013-09-03 MED ORDER — LEVOCETIRIZINE DIHYDROCHLORIDE 5 MG PO TABS: 5.0000 mg | ORAL_TABLET | Freq: Every day | ORAL | Status: DC

## 2013-09-03 MED ORDER — OXYCODONE-ACETAMINOPHEN 5-325 MG PO TABS: 1.0000 | ORAL_TABLET | ORAL | Status: DC | PRN

## 2013-09-03 MED ORDER — OLOPATADINE HCL 0.1 % OP SOLN
1.0000 [drp] | Freq: Two times a day (BID) | OPHTHALMIC | Status: DC
  Administered 2013-09-03 – 2013-09-05 (×4): 1 [drp] via OPHTHALMIC
  Filled 2013-09-03: qty 5

## 2013-09-03 MED ORDER — ASPIRIN EC 325 MG PO TBEC
325.0000 mg | DELAYED_RELEASE_TABLET | Freq: Every day | ORAL | Status: DC
  Administered 2013-09-04 – 2013-09-05 (×2): 325 mg via ORAL
  Filled 2013-09-03 (×3): qty 1

## 2013-09-03 MED ORDER — FENTANYL CITRATE 0.05 MG/ML IJ SOLN
INTRAMUSCULAR | Status: AC
  Administered 2013-09-03: 75 ug via INTRAVENOUS
  Filled 2013-09-03: qty 2

## 2013-09-03 MED ORDER — VITAMIN D3 25 MCG (1000 UNIT) PO TABS
2000.0000 [IU] | ORAL_TABLET | Freq: Every day | ORAL | Status: DC
  Administered 2013-09-04 – 2013-09-05 (×2): 2000 [IU] via ORAL
  Filled 2013-09-03 (×2): qty 2

## 2013-09-03 MED ORDER — KETOROLAC TROMETHAMINE 15 MG/ML IJ SOLN
15.0000 mg | Freq: Four times a day (QID) | INTRAMUSCULAR | Status: AC
  Administered 2013-09-04 (×3): 15 mg via INTRAVENOUS
  Filled 2013-09-03 (×3): qty 1

## 2013-09-03 MED ORDER — PHENOL 1.4 % MT LIQD: 1.0000 | OROMUCOSAL | Status: DC | PRN

## 2013-09-03 MED ORDER — LIDOCAINE HCL (CARDIAC) 20 MG/ML IV SOLN
INTRAVENOUS | Status: AC
  Filled 2013-09-03: qty 5

## 2013-09-03 MED ORDER — LOSARTAN POTASSIUM 50 MG PO TABS
50.0000 mg | ORAL_TABLET | Freq: Every day | ORAL | Status: DC
  Administered 2013-09-03 – 2013-09-05 (×2): 50 mg via ORAL
  Filled 2013-09-03 (×3): qty 1

## 2013-09-03 MED ORDER — METHOCARBAMOL 100 MG/ML IJ SOLN
500.0000 mg | Freq: Four times a day (QID) | INTRAVENOUS | Status: DC | PRN
  Administered 2013-09-03: 500 mg via INTRAVENOUS
  Filled 2013-09-03: qty 5

## 2013-09-03 MED ORDER — LACTATED RINGERS IV SOLN
INTRAVENOUS | Status: DC
  Administered 2013-09-03: 11:00:00 via INTRAVENOUS

## 2013-09-03 MED ORDER — MENTHOL 3 MG MT LOZG: 1.0000 | LOZENGE | OROMUCOSAL | Status: DC | PRN

## 2013-09-03 MED ORDER — HYDROMORPHONE HCL PF 1 MG/ML IJ SOLN
0.2500 mg | INTRAMUSCULAR | Status: DC | PRN
  Administered 2013-09-03 (×3): 0.5 mg via INTRAVENOUS

## 2013-09-03 MED ORDER — HYDROMORPHONE HCL PF 1 MG/ML IJ SOLN
INTRAMUSCULAR | Status: AC
Start: 2013-09-03 — End: 2013-09-04
  Filled 2013-09-03: qty 1

## 2013-09-03 MED ORDER — SODIUM CHLORIDE 0.9 % IR SOLN
Status: DC | PRN
  Administered 2013-09-03: 3000 mL

## 2013-09-03 MED ORDER — ONDANSETRON HCL 4 MG/2ML IJ SOLN
INTRAMUSCULAR | Status: DC | PRN
  Administered 2013-09-03: 4 mg via INTRAVENOUS

## 2013-09-03 MED ORDER — LACTATED RINGERS IV SOLN
INTRAVENOUS | Status: DC | PRN
  Administered 2013-09-03 (×2): via INTRAVENOUS

## 2013-09-03 MED ORDER — DILTIAZEM HCL ER COATED BEADS 240 MG PO CP24
240.0000 mg | ORAL_CAPSULE | Freq: Every day | ORAL | Status: DC
  Administered 2013-09-05: 240 mg via ORAL
  Filled 2013-09-03 (×2): qty 1

## 2013-09-03 MED ORDER — PROPOFOL 10 MG/ML IV BOLUS
INTRAVENOUS | Status: AC
Start: 2013-09-03 — End: 2013-09-03
  Filled 2013-09-03: qty 20

## 2013-09-03 MED ORDER — PANTOPRAZOLE SODIUM 40 MG PO TBEC
80.0000 mg | DELAYED_RELEASE_TABLET | Freq: Every day | ORAL | Status: DC
  Administered 2013-09-04 – 2013-09-05 (×2): 80 mg via ORAL
  Filled 2013-09-03 (×2): qty 2

## 2013-09-03 MED ORDER — ONDANSETRON HCL 4 MG/2ML IJ SOLN
4.0000 mg | Freq: Four times a day (QID) | INTRAMUSCULAR | Status: DC | PRN
  Administered 2013-09-03: 4 mg via INTRAVENOUS
  Filled 2013-09-03: qty 2

## 2013-09-03 MED ORDER — ACETAMINOPHEN 325 MG PO TABS: 650.0000 mg | ORAL_TABLET | Freq: Four times a day (QID) | ORAL | Status: DC | PRN

## 2013-09-03 MED ORDER — FLUTICASONE PROPIONATE HFA 44 MCG/ACT IN AERO
1.0000 | INHALATION_SPRAY | Freq: Two times a day (BID) | RESPIRATORY_TRACT | Status: DC
  Administered 2013-09-04 – 2013-09-05 (×3): 1 via RESPIRATORY_TRACT
  Filled 2013-09-03: qty 10.6

## 2013-09-03 MED ORDER — HYDROMORPHONE HCL PF 1 MG/ML IJ SOLN
INTRAMUSCULAR | Status: AC
  Filled 2013-09-03: qty 1

## 2013-09-03 MED ORDER — MIDAZOLAM HCL 2 MG/2ML IJ SOLN
INTRAMUSCULAR | Status: AC
  Administered 2013-09-03: 2 mg
  Filled 2013-09-03: qty 2

## 2013-09-03 MED ORDER — KCL IN DEXTROSE-NACL 20-5-0.45 MEQ/L-%-% IV SOLN
INTRAVENOUS | Status: DC
  Administered 2013-09-03 – 2013-09-04 (×2): via INTRAVENOUS
  Filled 2013-09-03 (×5): qty 1000

## 2013-09-03 MED ORDER — NEOSTIGMINE METHYLSULFATE 1 MG/ML IJ SOLN
INTRAMUSCULAR | Status: DC | PRN
  Administered 2013-09-03: 3 mg via INTRAVENOUS

## 2013-09-03 SURGICAL SUPPLY — 72 items
APL SKNCLS STERI-STRIP NONHPOA (GAUZE/BANDAGES/DRESSINGS) ×1
BANDAGE ELASTIC 4 VELCRO ST LF (GAUZE/BANDAGES/DRESSINGS) ×2 IMPLANT
BANDAGE ELASTIC 6 VELCRO ST LF (GAUZE/BANDAGES/DRESSINGS) ×1 IMPLANT
BANDAGE ESMARK 6X9 LF (GAUZE/BANDAGES/DRESSINGS) ×1 IMPLANT
BENZOIN TINCTURE PRP APPL 2/3 (GAUZE/BANDAGES/DRESSINGS) ×2 IMPLANT
BLADE SAGITTAL 25.0X1.19X90 (BLADE) ×2 IMPLANT
BLADE SAW SGTL 13X75X1.27 (BLADE) ×2 IMPLANT
BNDG CMPR 9X6 STRL LF SNTH (GAUZE/BANDAGES/DRESSINGS) ×1
BNDG CMPR MED 10X6 ELC LF (GAUZE/BANDAGES/DRESSINGS) ×1
BNDG ELASTIC 6X10 VLCR STRL LF (GAUZE/BANDAGES/DRESSINGS) ×2 IMPLANT
BNDG ESMARK 6X9 LF (GAUZE/BANDAGES/DRESSINGS) ×2
BOWL SMART MIX CTS (DISPOSABLE) ×2 IMPLANT
CAPT RP KNEE ×1 IMPLANT
CEMENT HV SMART SET (Cement) ×4 IMPLANT
CLSR STERI-STRIP ANTIMIC 1/2X4 (GAUZE/BANDAGES/DRESSINGS) ×1 IMPLANT
COVER BACK TABLE 24X17X13 BIG (DRAPES) IMPLANT
COVER SURGICAL LIGHT HANDLE (MISCELLANEOUS) ×2 IMPLANT
CUFF TOURNIQUET SINGLE 34IN LL (TOURNIQUET CUFF) ×2 IMPLANT
CUFF TOURNIQUET SINGLE 44IN (TOURNIQUET CUFF) IMPLANT
DRAPE ORTHO SPLIT 77X108 STRL (DRAPES) ×4
DRAPE SURG ORHT 6 SPLT 77X108 (DRAPES) ×2 IMPLANT
DRAPE U-SHAPE 47X51 STRL (DRAPES) ×2 IMPLANT
DRSG PAD ABDOMINAL 8X10 ST (GAUZE/BANDAGES/DRESSINGS) ×4 IMPLANT
DURAPREP 26ML APPLICATOR (WOUND CARE) ×2 IMPLANT
ELECT REM PT RETURN 9FT ADLT (ELECTROSURGICAL) ×2
ELECTRODE REM PT RTRN 9FT ADLT (ELECTROSURGICAL) ×1 IMPLANT
FACESHIELD WRAPAROUND (MASK) ×2 IMPLANT
FACESHIELD WRAPAROUND OR TEAM (MASK) ×1 IMPLANT
GAUZE XEROFORM 5X9 LF (GAUZE/BANDAGES/DRESSINGS) ×2 IMPLANT
GLOVE BIOGEL PI IND STRL 7.5 (GLOVE) ×1 IMPLANT
GLOVE BIOGEL PI IND STRL 8 (GLOVE) ×1 IMPLANT
GLOVE BIOGEL PI INDICATOR 7.5 (GLOVE) ×1
GLOVE BIOGEL PI INDICATOR 8 (GLOVE) ×1
GLOVE ECLIPSE 7.0 STRL STRAW (GLOVE) ×2 IMPLANT
GLOVE ORTHO TXT STRL SZ7.5 (GLOVE) ×2 IMPLANT
GOWN STRL REUS W/ TWL LRG LVL3 (GOWN DISPOSABLE) ×3 IMPLANT
GOWN STRL REUS W/ TWL XL LVL3 (GOWN DISPOSABLE) ×1 IMPLANT
GOWN STRL REUS W/TWL LRG LVL3 (GOWN DISPOSABLE) ×6
GOWN STRL REUS W/TWL XL LVL3 (GOWN DISPOSABLE) ×2
HANDPIECE INTERPULSE COAX TIP (DISPOSABLE) ×2
IMMOBILIZER KNEE 22 UNIV (SOFTGOODS) ×2 IMPLANT
KIT BASIN OR (CUSTOM PROCEDURE TRAY) ×2 IMPLANT
KIT ROOM TURNOVER OR (KITS) ×2 IMPLANT
MANIFOLD NEPTUNE II (INSTRUMENTS) ×2 IMPLANT
MARKER SPHERE PSV REFLC THRD 5 (MARKER) ×6 IMPLANT
NDL 1/2 CIR MAYO (NEEDLE) ×1 IMPLANT
NDL HYPO 25GX1X1/2 BEV (NEEDLE) ×1 IMPLANT
NEEDLE 1/2 CIR MAYO (NEEDLE) ×2 IMPLANT
NEEDLE HYPO 25GX1X1/2 BEV (NEEDLE) ×2 IMPLANT
NS IRRIG 1000ML POUR BTL (IV SOLUTION) ×2 IMPLANT
PACK TOTAL JOINT (CUSTOM PROCEDURE TRAY) ×2 IMPLANT
PAD ARMBOARD 7.5X6 YLW CONV (MISCELLANEOUS) ×4 IMPLANT
PAD CAST 4YDX4 CTTN HI CHSV (CAST SUPPLIES) ×1 IMPLANT
PADDING CAST COTTON 4X4 STRL (CAST SUPPLIES) ×2
PADDING CAST COTTON 6X4 STRL (CAST SUPPLIES) ×2 IMPLANT
PIN SCHANZ 4MM 130MM (PIN) ×8 IMPLANT
SET HNDPC FAN SPRY TIP SCT (DISPOSABLE) ×1 IMPLANT
SPONGE GAUZE 4X4 12PLY (GAUZE/BANDAGES/DRESSINGS) ×2 IMPLANT
SPONGE GAUZE 4X4 12PLY STER LF (GAUZE/BANDAGES/DRESSINGS) ×1 IMPLANT
STAPLER VISISTAT 35W (STAPLE) ×2 IMPLANT
STRIP CLOSURE SKIN 1/2X4 (GAUZE/BANDAGES/DRESSINGS) ×4 IMPLANT
SUCTION FRAZIER TIP 10 FR DISP (SUCTIONS) ×2 IMPLANT
SUT ETHIBOND NAB CT1 #1 30IN (SUTURE) ×2 IMPLANT
SUT VIC AB 2-0 CT1 27 (SUTURE) ×4
SUT VIC AB 2-0 CT1 TAPERPNT 27 (SUTURE) ×2 IMPLANT
SUT VICRYL 4-0 PS2 18IN ABS (SUTURE) ×2 IMPLANT
SUT VICRYL AB 2 0 TIES (SUTURE) ×2 IMPLANT
SYR CONTROL 10ML LL (SYRINGE) ×2 IMPLANT
TOWEL OR 17X24 6PK STRL BLUE (TOWEL DISPOSABLE) ×2 IMPLANT
TOWEL OR 17X26 10 PK STRL BLUE (TOWEL DISPOSABLE) ×2 IMPLANT
TRAY FOLEY CATH 16FRSI W/METER (SET/KITS/TRAYS/PACK) ×2 IMPLANT
WATER STERILE IRR 1000ML POUR (IV SOLUTION) ×6 IMPLANT

## 2013-09-03 NOTE — Discharge Instructions (Signed)
Keep knee incision dry for 5 days post op then may wet while bathing. °Therapy daily and goal full extension and greater than 90 degrees flexion. °Call if fever or chills or increased drainage. °Go to ER if acutely short of breath or call for ambulance. °Return for follow up in 2 weeks. °May full weight bear on the surgical leg unless told otherwise. °Use knee immobilizer until able to straight leg raise off bed with knee stable. °In house walking for first 2 weeks. °

## 2013-09-03 NOTE — Interval H&P Note (Signed)
History and Physical Interval Note:  09/03/2013 12:17 PM  Kristy [name]  has presented today for surgery, with the diagnosis of Right Knee Osteoarthritis   The various methods of treatment have been discussed with the patient and family. After consideration of risks, benefits and other options for treatment, the patient has consented to  Procedure(s) with comments: COMPUTER ASSISTED TOTAL KNEE ARTHROPLASTY (Right) - Right Total Knee Arthroplasty, Computer Assist as a surgical intervention .  The patient's history has been reviewed, patient examined, no change in status, stable for surgery.  I have reviewed the patient's chart and labs.  Questions were answered to the patient's satisfaction.     Gurleen Larrivee C

## 2013-09-03 NOTE — Transfer of Care (Signed)
Immediate Anesthesia Transfer of Care Note  Patient: Kristy [name]  Procedure(s) Performed: Procedure(s) with comments: COMPUTER ASSISTED TOTAL KNEE ARTHROPLASTY (Right) - Right Total Knee Arthroplasty, Computer Assist  Patient Location: PACU  Anesthesia Type:General  Level of Consciousness: sedated  Airway & Oxygen Therapy: Patient Spontanous Breathing and Patient connected to nasal cannula oxygen  Post-op Assessment: Report given to PACU RN, Post -op Vital signs reviewed and stable and Patient moving all extremities  Post vital signs: Reviewed and stable  Complications: No apparent anesthesia complications

## 2013-09-03 NOTE — Brief Op Note (Cosign Needed)
09/03/2013  3:12 PM  PATIENT:  Kristy [name]  68 y.o. female  PRE-OPERATIVE DIAGNOSIS:  Right Knee Osteoarthritis   POST-OPERATIVE DIAGNOSIS:  Right Knee Osteoarthritis   PROCEDURE:  Procedure(s) with comments: COMPUTER ASSISTED TOTAL KNEE ARTHROPLASTY (Right) - Right Total Knee Arthroplasty, Computer Assist  SURGEON:  Surgeon(s) and Role:    * Marybelle Killings, MD - Primary  PHYSICIAN ASSISTANT:Nili Honda PAC   ASSISTANTS: none   ANESTHESIA:   general  EBL:  Total I/O In: 1000 [I.V.:1000] Out: 150 [Blood:150]  BLOOD ADMINISTERED:none  DRAINS: none   LOCAL MEDICATIONS USED:  NONE  SPECIMEN:  No Specimen  DISPOSITION OF SPECIMEN:  N/A  COUNTS:  YES  TOURNIQUET:   Total Tourniquet Time Documented: Thigh (Right) - 78 minutes Total: Thigh (Right) - 78 minutes   DICTATION: .Note written in EPIC  PLAN OF CARE: Admit to inpatient   PATIENT DISPOSITION:  PACU - hemodynamically stable.   Delay start of Pharmacological VTE agent (>24hrs) due to surgical blood loss or risk of bleeding: no

## 2013-09-03 NOTE — Anesthesia Procedure Notes (Addendum)
Anesthesia Regional Block:  Femoral nerve block  Pre-Anesthetic Checklist: ,, timeout performed, Correct Patient, Correct Site, Correct Laterality, Correct Procedure, Correct Position, site marked, Risks and benefits discussed,  Surgical consent,  Pre-op evaluation,  At surgeon's request and post-op pain management  Laterality: Right  Prep: chloraprep       Needles:   Needle Type: Stimulator Needle - 80          Additional Needles:  Procedures: Doppler guided, ultrasound guided (picture in chart) and nerve stimulator Femoral nerve block  Nerve Stimulator or Paresthesia:  Response: 0.5 mA,   Additional Responses:   Narrative:  Start time: 09/03/2013 11:35 AM End time: 09/03/2013 11:50 AM Injection made incrementally with aspirations every 5 mL.  Performed by: Personally  Anesthesiologist: Dr. Oletta Lamas   Procedure Name: Intubation Date/Time: 09/03/2013 12:40 PM Performed by: Scheryl Darter Pre-anesthesia Checklist: Patient identified, Emergency Drugs available, Suction available, Patient being monitored and Timeout performed Patient Re-evaluated:Patient Re-evaluated prior to inductionOxygen Delivery Method: Circle system utilized Preoxygenation: Pre-oxygenation with 100% oxygen Intubation Type: IV induction Ventilation: Mask ventilation without difficulty Laryngoscope Size: Miller and 2 Grade View: Grade I Tube type: Oral Tube size: 7.0 mm Number of attempts: 1 Airway Equipment and Method: Stylet Placement Confirmation: ETT inserted through vocal cords under direct vision,  positive ETCO2 and breath sounds checked- equal and bilateral Secured at: 22 cm Tube secured with: Tape    .ce

## 2013-09-03 NOTE — Progress Notes (Signed)
Orthopedic Tech Progress Note Patient Details:  Kristy [name] 03-27-1946 [id number]  Patient ID: [name], female   DOB: Mar 21, 1946, 68 y.o.   MRN: [id number] Viewed order from doctor's order list  Hildred Priest 09/03/2013, 3:52 PM

## 2013-09-03 NOTE — Op Note (Signed)
Preop diagnosis: Right knee osteoarthritis with varus deformity  Postop diagnosis: Same  Procedure: Right cemented total knee arthroplasty, computer assist.  Components Depuy LCS [employer] #3 femur cemented #3 tibia cemented 12.5 mm rotating platform 35 mm 3 PEG all poly-patella  Surgeon: Rodell Perna M.D.  Assistant: Phillips Hay PA-C medically necessary and present for the entire procedure  Anesthesia preoperative femoral nerve block plus general  Tourniquet time: 1 hour 9 minutes x350 pressure  Procedure: After induction general anesthesia proximal thigh tourniquet application prepping with chlor prep due to the patient's the iodine allergy usual total knee sheets drapes clear plastic Steri-Drape was applied after sterile skin marker. Leg was wrapped an Esmarch after timeout procedure was completed Ancef was given prophylactically. Patient had an old incision and 7 cm skin bridge was maintained. Superficial retinaculum was divided medial parapatellar true retinacular incision was made. Patella was everted there were large spurs grade 4 chondromalacia all ports the joint. Computer pins were inserted. Patient is in 15 valgus 9 flexion contracture with significant deformity. Spurs removed off the medial aspect partially cement collateral ligament off of the tibia with three-quarter curved osteotome due to the extreme varus and tightness. Meniscal remnants were selected. Computer model model was generated for the tibia and femur and since patient started had the opposite hemiarthroplasty #3 size was appropriate. 10 mm taken off the distal femoral cut. Piano sign looked good. All cuts were verified. On the tibial side initially a millimeters were taken but this did not give adequate resection the medial aspect. Eburnated bone posteriorly causes blade disc out of off additional bone had to be taken medial and posterior until it was less than 1 mm difference with the a computer numbers. Spacer  blocks were inserted there is 1 mm difference with more medial tightness some additional releasing the medial collateral ligament spurs removed off the femur. Box cut was made. Posterior spurs removed off the femur using three-quarter curved osteotome and some additional stripping of femur compos with the capsule. There was some muscle maintained PCL was a remnant which was resected still some posterior capsule left. 10 and then 12 mm 12.5 mm spacers were used and the 12.5 gave restoration with good balance full extension hyperextension 2. Block showed good balance and the numbers were 22.5 and 23.5 for medial and lateral flexion-extension gaps respectively. Pulsatile lavage vacuum mixed cement mixing of the cement prepeg Polly was inserted last. Tibia cemented first excessive cement was removed followed by femur insertion of the terminal and the 12.5 mm Polly. Patella was held with a clamp cement was hard at 15 minutes tourniquet deflated hemostasis obtained in standard layer closure with #1 interrupted Ethibond the deep layer 2-0 Vicryl sit superficial retinaculum subtendinous tissue for Vicryl subcuticular closure for nylon in the stab incisions for pins placement tibial level. Patient saw the procedure well transferred to her room after application postop dressing and knee immobilizer. Signed Rodell Perna M.D.

## 2013-09-03 NOTE — Plan of Care (Signed)
Problem: Consults Goal: Diagnosis- Total Joint Replacement Primary Total Knee Right     

## 2013-09-03 NOTE — Anesthesia Postprocedure Evaluation (Signed)
  Anesthesia Post-op Note  Patient: Kristy [name]  Procedure(s) Performed: Procedure(s) with comments: COMPUTER ASSISTED TOTAL KNEE ARTHROPLASTY (Right) - Right Total Knee Arthroplasty, Computer Assist  Patient Location: PACU  Anesthesia Type:General  Level of Consciousness: awake  Airway and Oxygen Therapy: Patient Spontanous Breathing  Post-op Pain: mild  Post-op Assessment: Post-op Vital signs reviewed  Post-op Vital Signs: Reviewed  Complications: No apparent anesthesia complications

## 2013-09-03 NOTE — Anesthesia Preprocedure Evaluation (Addendum)
Anesthesia Evaluation  Patient identified by MRN, date of birth, ID band Patient awake    Reviewed: Allergy & Precautions, H&P , NPO status , reviewed documented beta blocker date and time   Airway Mallampati: II      Dental   Pulmonary shortness of breath, asthma ,  breath sounds clear to auscultation        Cardiovascular hypertension, Rhythm:Regular Rate:Normal     Neuro/Psych    GI/Hepatic Neg liver ROS, hiatal hernia, GERD-  ,  Endo/Other  negative endocrine ROS  Renal/GU negative Renal ROS     Musculoskeletal  (+) Fibromyalgia -  Abdominal   Peds  Hematology   Anesthesia Other Findings   Reproductive/Obstetrics                          Anesthesia Physical Anesthesia Plan  ASA: III  Anesthesia Plan: General   Post-op Pain Management:    Induction: Intravenous  Airway Management Planned: Oral ETT  Additional Equipment:   Intra-op Plan:   Post-operative Plan: Extubation in OR  Informed Consent: I have reviewed the patients History and Physical, chart, labs and discussed the procedure including the risks, benefits and alternatives for the proposed anesthesia with the patient or authorized representative who has indicated his/her understanding and acceptance.   Dental advisory given  Plan Discussed with: CRNA and Anesthesiologist  Anesthesia Plan Comments:         Anesthesia Quick Evaluation

## 2013-09-03 NOTE — Progress Notes (Signed)
Orthopedic Tech Progress Note Patient Details:  Kristy [name] June 22, 1945 [id number] Cpm;right knee @ 0-60 degrees; footsie roll ; trapeze bar patient helper     Hildred Priest 09/03/2013, 3:49 PM

## 2013-09-04 LAB — BASIC METABOLIC PANEL
BUN: 8 mg/dL (ref 6–23)
CHLORIDE: 102 meq/L (ref 96–112)
CO2: 24 meq/L (ref 19–32)
CREATININE: 0.59 mg/dL (ref 0.50–1.10)
Calcium: 8.2 mg/dL — ABNORMAL LOW (ref 8.4–10.5)
GFR calc Af Amer: >90 mL/min (ref >90)
GFR calc non Af Amer: >90 mL/min (ref >90)
GLUCOSE: 148 mg/dL — AB (ref 70–99)
Potassium: 3.8 mEq/L (ref 3.7–5.3)
Sodium: 140 mEq/L (ref 137–147)

## 2013-09-04 LAB — CBC
HEMATOCRIT: 32.4 % — AB (ref 36.0–46.0)
HEMOGLOBIN: 11 g/dL — AB (ref 12.0–15.0)
MCH: 28.9 pg (ref 26.0–34.0)
MCHC: 34 g/dL (ref 30.0–36.0)
MCV: 85 fL (ref 78.0–100.0)
Platelets: 198 10*3/uL (ref 150–400)
RBC: 3.81 MIL/uL — AB (ref 3.87–5.11)
RDW: 13.4 % (ref 11.5–15.5)
WBC: 11.4 10*3/uL — AB (ref 4.0–10.5)

## 2013-09-04 NOTE — Progress Notes (Signed)
Utilization review completed.  

## 2013-09-04 NOTE — Progress Notes (Signed)
Physical Therapy Treatment Patient Details Name: Kristy [name] MRN: [id number] DOB: [birth year] Today's Date: 09/04/2013    History of Present Illness COMPUTER ASSISTED TOTAL KNEE ARTHROPLASTY (Right)    PT Comments    Pt progressing well towards physical therapy goals, ambulates up to 65 feet with min guard for safety and shows good control of operative knee while knee immobilizer is in place. Pt will continue to benefit from skilled physical therapy to focus on improving independence with functional mobility before d/c home.   Follow Up Recommendations  Home health PT;Supervision for mobility/OOB     Equipment Recommendations  None recommended by PT    Recommendations for Other Services OT consult     Precautions / Restrictions Precautions Precautions: Knee Precaution Booklet Issued: Yes (comment) Precaution Comments: Reviewed handout for exercises and precautions Required Braces or Orthoses: Knee Immobilizer - Right Restrictions Weight Bearing Restrictions: Yes RLE Weight Bearing: Weight bearing as tolerated    Mobility  Bed Mobility Overal bed mobility: Needs Assistance Bed Mobility: Sit to Supine     Supine to sit: Min guard     General bed mobility comments: Pt transfer sit>supine in bed without assistance. Uses her UEs to bring RLE into bed. Cues for technique  Transfers Overall transfer level: Needs assistance Equipment used: Rolling walker (2 wheeled) Transfers: Sit to/from Stand Sit to Stand: Min guard         General transfer comment: Min guard for safety with sit>stand from recliner. Pt with good positioning and hand placement. Cues to increase WB on RLE once in standing position  Ambulation/Gait Ambulation/Gait assistance: Min guard Ambulation Distance (Feet): 65 Feet Assistive device: Rolling walker (2 wheeled) Gait Pattern/deviations: Step-through pattern;Decreased stance time - right;Antalgic     General Gait Details: Pt relies heavily on RW  to prevent WB through RLE during stance phase. Generally moving well though with good pace and posture. No instances of knee buckling this afternoon while wearing knee immobilizer. Distance progressing nicely. Cues for R knee extension during stance phase with contraction of quads.   Stairs            Wheelchair Mobility    Modified Rankin (Stroke Patients Only)       Balance                                    Cognition Arousal/Alertness: Awake/alert Behavior During Therapy: WFL for tasks assessed/performed Overall Cognitive Status: Within Functional Limits for tasks assessed                      Exercises      General Comments        Pertinent Vitals/Pain Pt states pain is "low" does not give numerical value Pt repositioned in bed for comfort. CPM turned on at 1527 from 0-65 degrees    Home Living                      Prior Function            PT Goals (current goals can now be found in the care plan section) Acute Rehab PT Goals Patient Stated Goal: GO home PT Goal Formulation: With patient Time For Goal Achievement: 09/11/13 Potential to Achieve Goals: Good Progress towards PT goals: Progressing toward goals    Frequency  7X/week    PT Plan Current plan remains appropriate  Co-evaluation             End of Session Equipment Utilized During Treatment: Gait belt;Right knee immobilizer Activity Tolerance: Patient tolerated treatment well Patient left: with call bell/phone within reach;in bed;in CPM;with family/visitor present     Time: 1448-1856 PT Time Calculation (min): 12 min  Charges:  $Gait Training: 8-22 mins                    G Codes:      Elayne Snare, [place]  Ellouise Newer 09/04/2013, 4:26 PM

## 2013-09-04 NOTE — Evaluation (Signed)
Physical Therapy Evaluation Patient Details Name: Kristy [name] MRN: [id number] DOB: 1945/12/29 Today's Date: 09/04/2013   History of Present Illness  COMPUTER ASSISTED TOTAL KNEE ARTHROPLASTY (Right)  Clinical Impression  Pt is s/p right TKA resulting in the deficits listed below (see PT Problem List). She plans to d/c home with assistance from family. Pt is ambulating up to 35 feet this AM. Pt will benefit from skilled PT to increase their independence and safety with mobility to allow discharge to the venue listed below.      Follow Up Recommendations Home health PT;Supervision for mobility/OOB    Equipment Recommendations  None recommended by PT    Recommendations for Other Services OT consult     Precautions / Restrictions Precautions Precautions: Knee Precaution Booklet Issued: No Precaution Comments: Reviewed verbally Required Braces or Orthoses: Knee Immobilizer - Right Restrictions Weight Bearing Restrictions: Yes RLE Weight Bearing: Weight bearing as tolerated      Mobility  Bed Mobility Overal bed mobility: Needs Assistance Bed Mobility: Supine to Sit     Supine to sit: Min guard     General bed mobility comments: Pt able to perform supine>sit with min guard for safety and verbal cues for technique. Did not need physical assist.  Transfers Overall transfer level: Needs assistance Equipment used: Rolling walker (2 wheeled) Transfers: Sit to/from Stand Sit to Stand: Min guard         General transfer comment: Min guard for safety; verbal cues for hand placement, pt did not need physical assist to stand. Requires extra time  Ambulation/Gait Ambulation/Gait assistance: Min guard;+2 safety/equipment Ambulation Distance (Feet): 35 Feet Assistive device: Rolling walker (2 wheeled) Gait Pattern/deviations: Step-to pattern;Antalgic;Decreased stance time - right;Trunk flexed     General Gait Details: Min guard for saftey with second person to follow with  recliner. Pt with one instance of knee buckling while wearing knee immobilizer but was able to correct herself without physical assistance. Verbal cues for sequencing of gait, and to slow down as patient began to rush as she fatigued.  Stairs            Wheelchair Mobility    Modified Rankin (Stroke Patients Only)       Balance Overall balance assessment: Needs assistance Sitting-balance support: No upper extremity supported Sitting balance-Leahy Scale: Fair Sitting balance - Comments: Sits EOB without assist   Standing balance support: Bilateral upper extremity supported Standing balance-Leahy Scale: Poor Standing balance comment: Requires RW for UE support                             Pertinent Vitals/Pain "2-4/10" pain Nurse entered room during therapy and asked if pt would like pain medication - pt denied. Pt repositioned in chair for comfort; knee positioned for optimal extension    Home Living Family/patient expects to be discharged to:: Private residence Living Arrangements: Spouse/significant other Available Help at Discharge: Family;Available 24 hours/day Type of Home: House Home Access: Ramped entrance     Home Layout: One level Home Equipment: Walker - 2 wheels;Bedside commode;Shower seat      Prior Function Level of Independence: Independent with assistive device(s)         Comments: uses RW for mobility     Hand Dominance   Dominant Hand: Right    Extremity/Trunk Assessment   Upper Extremity Assessment: Overall WFL for tasks assessed           Lower Extremity Assessment: RLE deficits/detail  RLE Deficits / Details: RLE extensor lag >20 degrees       Communication   Communication: No difficulties  Cognition Arousal/Alertness: Awake/alert Behavior During Therapy: WFL for tasks assessed/performed Overall Cognitive Status: Within Functional Limits for tasks assessed                      General Comments General  comments (skin integrity, edema, etc.): Pt educated on basic exercises and knee precautions.    Exercises Total Joint Exercises Ankle Circles/Pumps: AROM;Both;10 reps;Supine Quad Sets: AROM;Both;10 reps;Supine      Assessment/Plan    PT Assessment Patient needs continued PT services  PT Diagnosis Difficulty walking;Abnormality of gait;Acute pain   PT Problem List Decreased strength;Decreased range of motion;Decreased activity tolerance;Decreased balance;Decreased mobility;Decreased knowledge of use of DME;Decreased knowledge of precautions;Pain  PT Treatment Interventions DME instruction;Gait training;Functional mobility training;Therapeutic activities;Therapeutic exercise;Balance training;Neuromuscular re-education;Modalities   PT Goals (Current goals can be found in the Care Plan section) Acute Rehab PT Goals Patient Stated Goal: GO home PT Goal Formulation: With patient Time For Goal Achievement: 09/11/13 Potential to Achieve Goals: Good    Frequency 7X/week   Barriers to discharge        Co-evaluation               End of Session Equipment Utilized During Treatment: Gait belt;Right knee immobilizer Activity Tolerance: Patient tolerated treatment well Patient left: in chair;with call bell/phone within reach Nurse Communication: Mobility status         Time: 8250-5397 PT Time Calculation (min): 25 min   Charges:   PT Evaluation $Initial PT Evaluation Tier I: 1 Procedure PT Treatments $Gait Training: 8-22 mins   PT G Codes:         [employer], [place]  Ellouise Newer 09/04/2013, 12:16 PM

## 2013-09-04 NOTE — Progress Notes (Signed)
Patient ID: Kristy [name], female   DOB: Dec 03, 1945, 68 y.o.   MRN: [id number]     Subjective: 1 Day Post-Op Procedure(s) (LRB): COMPUTER ASSISTED TOTAL KNEE ARTHROPLASTY (Right) Patient reports pain as mild.  Nausea and vomiting last night, responded to medication and this AM better.   Objective: Vital signs in last 24 hours: Temp:  [97.1 F (36.2 C)-98.2 F (36.8 C)] 98.2 F (36.8 C) (04/07 0616) Pulse Rate:  [82-97] 93 (04/07 0616) Resp:  [12-20] 20 (04/07 0616) BP: (108-144)/(52-93) 108/52 mmHg (04/07 0616) SpO2:  [91 %-100 %] 99 % (04/07 0616) FiO2 (%):  [28 %] 28 % (04/06 1646) Weight:  [94.2 kg (207 lb 10.8 oz)] 94.2 kg (207 lb 10.8 oz) (04/06 1025)  Intake/Output from previous day: 04/06 0701 - 04/07 0700 In: 1200 [I.V.:1200] Out: 150 [Blood:150] Intake/Output this shift:     Recent Labs  09/04/13 0645  HGB 11.0*    Recent Labs  09/04/13 0645  WBC 11.4*  RBC 3.81*  HCT 32.4*  PLT 198   No results found for this basename: NA, K, CL, CO2, BUN, CREATININE, GLUCOSE, CALCIUM,  in the last 72 hours No results found for this basename: LABPT, INR,  in the last 72 hours  Neurologically intact  Assessment/Plan: 1 Day Post-Op Procedure(s) (LRB): COMPUTER ASSISTED TOTAL KNEE ARTHROPLASTY (Right) Up with therapy  Nausea better this AM  YATES,MARK C 09/04/2013, 7:56 AM

## 2013-09-04 NOTE — Progress Notes (Signed)
OT Cancellation Note  Patient Details Name: Kristy [name] MRN: [id number] DOB: 1945-09-16   Cancelled Treatment:    Reason Eval/Treat Not Completed: OT screened, no needs identified, will sign off. OT made visit to address ADLs. Pt with Hx of L TKA in December and has DME at home (RW, toilet raiser and frame, tub transfer bench) and reports will have husband home 24/7 to assist with ADLs. Pt aware of precautions and compensatory techniques for LB ADLs. Acute OT to sign off.   Juluis Rainier [phone] 09/04/2013, 1:31 PM

## 2013-09-04 NOTE — Care Management Note (Signed)
CARE MANAGEMENT NOTE 09/04/2013  Patient:  Kristy [name], Kristy [name]   Account Number:  [id number]  Date Initiated:  09/04/2013  Documentation initiated by:  Ricki Miller  Subjective/Objective Assessment:   68 yr old female admitted with right knee osteoarthritis, s/p right total knee arthroplasty.     Action/Plan:   Case manager spoke with patient concerning home health and DME needs at discharge. Choice offered. Patient has used [facility] in the past, requests them now. referral called to [place]. Patient has RW and 3in1 .   Anticipated DC Date:  09/05/2013   Anticipated DC Plan:  [place]  CM consult      [facility] Choice  HOME HEALTH   Choice offered to / List presented to:  C-1 Patient        [place] arranged  [place] PT      [place].   Status of service:  Completed, signed off Medicare Important Message given?   (If response is "NO", the following Medicare IM given date fields will be blank) Date Medicare IM given:   Date Additional Medicare IM given:    Discharge Disposition:  [place]

## 2013-09-05 ENCOUNTER — Encounter (HOSPITAL_COMMUNITY): Payer: Self-pay | Admitting: Orthopaedic Surgery

## 2013-09-05 ENCOUNTER — Ambulatory Visit: Payer: Federal, State, Local not specified - PPO | Admitting: Nurse Practitioner

## 2013-09-05 LAB — CBC
HEMATOCRIT: 30.8 % — AB (ref 36.0–46.0)
Hemoglobin: 10.4 g/dL — ABNORMAL LOW (ref 12.0–15.0)
MCH: 29 pg (ref 26.0–34.0)
MCHC: 33.8 g/dL (ref 30.0–36.0)
MCV: 85.8 fL (ref 78.0–100.0)
Platelets: 183 10*3/uL (ref 150–400)
RBC: 3.59 MIL/uL — ABNORMAL LOW (ref 3.87–5.11)
RDW: 13.6 % (ref 11.5–15.5)
WBC: 10.5 10*3/uL (ref 4.0–10.5)

## 2013-09-05 NOTE — Progress Notes (Signed)
Physical Therapy Treatment Patient Details Name: Kristy [name] MRN: [id number] DOB: 03-28-1946 Today's Date: 09/05/2013    History of Present Illness COMPUTER ASSISTED TOTAL KNEE ARTHROPLASTY (Right)    PT Comments    Pt is progressing very well towards physical therapy goals, ambulating up to 100 feet with supervision. She does not have any stairs to navigate at home and pertinent education has been covered with pt and husband present in room. They have no questions for PT concerning mobility at this time. Pt safe for d/c from PT standpoint. Will follow up with pt this afternoon to answer any further questions/concerns.  Follow Up Recommendations  Home health PT;Supervision for mobility/OOB     Equipment Recommendations  None recommended by PT    Recommendations for Other Services OT consult     Precautions / Restrictions Precautions Precautions: Knee Required Braces or Orthoses: Knee Immobilizer - Right Restrictions Weight Bearing Restrictions: Yes RLE Weight Bearing: Weight bearing as tolerated    Mobility  Bed Mobility Overal bed mobility: Needs Assistance Bed Mobility: Sit to Supine     Supine to sit: Supervision     General bed mobility comments: Pt transfer sit>supine in bed without assistance. Uses her UEs to bring RLE into bed. No physical assist needed by PT  Transfers Overall transfer level: Needs assistance Equipment used: Rolling walker (2 wheeled) Transfers: Sit to/from Stand Sit to Stand: Supervision         General transfer comment: Supervision for safety with sit>stand from lowest bed setting. Pt with good positioning and hand placement. Cues to increase WB on RLE once in standing position  Ambulation/Gait Ambulation/Gait assistance: Supervision Ambulation Distance (Feet): 100 Feet Assistive device: Rolling walker (2 wheeled) Gait Pattern/deviations: Step-through pattern;Decreased step length - left;Antalgic;Decreased stance time - right      General Gait Details: Pt ambulating very well today. Requires cues to increase step length on L in order to increase stance time on RLE. Pt states she feels comfortable ambulating without assistance at this time. Verbal cues provided to increase quad activation on R during stance phase. No instances of knee buckling this AM.   Stairs            Wheelchair Mobility    Modified Rankin (Stroke Patients Only)       Balance                                    Cognition Arousal/Alertness: Awake/alert Behavior During Therapy: WFL for tasks assessed/performed Overall Cognitive Status: Within Functional Limits for tasks assessed                      Exercises Total Joint Exercises Ankle Circles/Pumps: AROM;Both;10 reps;Supine Quad Sets: AROM;Both;10 reps;Supine Heel Slides: AROM;Right;5 reps;Seated Goniometric ROM: 2-83 degrees of right knee flexion    General Comments        Pertinent Vitals/Pain 4/10 pain - pt states she does not care for medication at this time Pt repositioned in chair for comfort    Home Living                      Prior Function            PT Goals (current goals can now be found in the care plan section) Acute Rehab PT Goals Patient Stated Goal: GO home PT Goal Formulation: With patient Time For Goal Achievement: 09/11/13  Potential to Achieve Goals: Good Progress towards PT goals: Progressing toward goals    Frequency  7X/week    PT Plan Current plan remains appropriate    Co-evaluation             End of Session Equipment Utilized During Treatment: Gait belt;Right knee immobilizer Activity Tolerance: Patient tolerated treatment well Patient left: with call bell/phone within reach;with family/visitor present;in chair     Time: 1022-1040 PT Time Calculation (min): 18 min  Charges:  $Gait Training: 8-22 mins                    G Codes:      Camille Bal [place], [place] 09/05/2013, 11:50 AM

## 2013-09-05 NOTE — Progress Notes (Addendum)
Patient ID: Kristy [name], female   DOB: July 14, 1945, 68 y.o.   MRN: [id number]     Subjective: 2 Days Post-Op Procedure(s) (LRB): COMPUTER ASSISTED TOTAL KNEE ARTHROPLASTY (Right) Patient reports pain as moderate.    Objective: Vital signs in last 24 hours: Temp:  [98.7 F (37.1 C)-99.6 F (37.6 C)] 98.7 F (37.1 C) (04/08 0511) Pulse Rate:  [110-122] 113 (04/08 0511) Resp:  [18-20] 18 (04/08 0511) BP: (92-119)/(40-60) 119/44 mmHg (04/08 0511) SpO2:  [92 %-98 %] 92 % (04/08 0511)  Intake/Output from previous day: 04/07 0701 - 04/08 0700 In: 1080 [P.O.:840; I.V.:240] Out: 750 [Urine:750] Intake/Output this shift:     Recent Labs  09/04/13 0645 09/05/13 0555  HGB 11.0* 10.4*    Recent Labs  09/04/13 0645 09/05/13 0555  WBC 11.4* 10.5  RBC 3.81* 3.59*  HCT 32.4* 30.8*  PLT 198 183    Recent Labs  09/04/13 0645  NA 140  K 3.8  CL 102  CO2 24  BUN 8  CREATININE 0.59  GLUCOSE 148*  CALCIUM 8.2*   No results found for this basename: LABPT, INR,  in the last 72 hours  Neurologically intact Compartment soft  Assessment/Plan: 2 Days Post-Op Procedure(s) (LRB): COMPUTER ASSISTED TOTAL KNEE ARTHROPLASTY (Right) Up with therapy   D/c IV  Marybelle Killings 09/05/2013, 8:07 AM Patient desires to go home today after PT . Has ramp, bedside commode, etc.

## 2013-09-05 NOTE — Discharge Summary (Signed)
Physician Discharge Summary  Patient ID: Kristy [name] MRN: [id number] DOB/AGE: Oct 01, 1945 68 y.o.  Admit date: 09/03/2013 Discharge date: 09/05/2013  Admission Diagnoses:  Osteoarthritis of right knee  Discharge Diagnoses:  Principal Problem:   Osteoarthritis of right knee   Past Medical History  Diagnosis Date  . Asthma   . Hypertension   . Hiatal hernia   . DUB (dysfunctional uterine bleeding)   . SUI (stress urinary incontinence, female)   . Atrophic vaginitis   . Fibroid   . Fibromyalgia   . Reflux   . Shortness of breath     occ  . GERD (gastroesophageal reflux disease)   . Arthritis     generalized joint pain, including back  . TIA (transient ischemic attack)     07/2012  . Stroke     tia 2/14  . History of kidney stones   . Vertigo     Surgeries: Procedure(s): COMPUTER ASSISTED TOTAL KNEE ARTHROPLASTY on 09/03/2013   Consultants (if any):    Discharged Condition: Improved  Hospital Course: Kristy [name] is an 68 y.o. female who was admitted 09/03/2013 with a diagnosis of Osteoarthritis of right knee and went to the operating room on 09/03/2013 and underwent the above named procedures.    She was given perioperative antibiotics:      Anti-infectives   Start     Dose/Rate Route Frequency Ordered Stop   09/03/13 0600  ceFAZolin (ANCEF) IVPB 2 g/50 mL premix     2 g 100 mL/hr over 30 Minutes Intravenous On call to O.R. 09/02/13 1415 09/03/13 1238    .  She was given sequential compression devices, early ambulation, and aspirin for DVT prophylaxis.  She benefited maximally from the hospital stay and there were no complications.    Recent vital signs:  Filed Vitals:   09/05/13 0511  BP: 119/44  Pulse: 113  Temp: 98.7 F (37.1 C)  Resp: 18    Recent laboratory studies:  Lab Results  Component Value Date   HGB 10.4* 09/05/2013   HGB 11.0* 09/04/2013   HGB 16.0* 08/24/2013   Lab Results  Component Value Date   WBC 10.5 09/05/2013   PLT 183 09/05/2013    Lab Results  Component Value Date   INR 0.95 08/24/2013   Lab Results  Component Value Date   NA 140 09/04/2013   K 3.8 09/04/2013   CL 102 09/04/2013   CO2 24 09/04/2013   BUN 8 09/04/2013   CREATININE 0.59 09/04/2013   GLUCOSE 148* 09/04/2013    Discharge Medications:     Medication List         aspirin 325 MG tablet  Take 1 tablet (325 mg total) by mouth daily.     ASTEPRO 0.15 % Soln  Generic drug:  Azelastine HCl  Place 2 sprays into the nose daily.     beclomethasone 80 MCG/ACT inhaler  Commonly known as:  QVAR  Inhale 1 puff into the lungs 2 (two) times daily.     diltiazem 240 MG 24 hr capsule  Commonly known as:  CARDIZEM CD  Take 240 mg by mouth daily.     EPIPEN 0.3 mg/0.3 mL Devi  Generic drug:  EPINEPHrine  Inject 0.3 mg into the muscle as needed (anaphylactic). For allergic reactions     furosemide 40 MG tablet  Commonly known as:  LASIX  Take 40 mg by mouth daily.     HM VITAMIN D3 2000 UNITS Caps  Generic drug:  Cholecalciferol  Take 2,000 Units by mouth daily.     levalbuterol 45 MCG/ACT inhaler  Commonly known as:  XOPENEX HFA  Inhale 2 puffs into the lungs every 4 (four) hours as needed for shortness of breath.     levocetirizine 5 MG tablet  Commonly known as:  XYZAL  Take 5 mg by mouth daily.     losartan 50 MG tablet  Commonly known as:  COZAAR  Take 50 mg by mouth daily.     methocarbamol 500 MG tablet  Commonly known as:  ROBAXIN  Take 1 tablet (500 mg total) by mouth every 6 (six) hours as needed for muscle spasms (spasm).     NONFORMULARY OR COMPOUNDED ITEM  - Estradiol .02%  - 1 ML Prefilled Applicator  - Sig: apply vaginally twice a week  - #90 Day Supply with 4 refills     nortriptyline 25 MG capsule  Commonly known as:  PAMELOR  Take 25 mg by mouth at bedtime.     omeprazole 40 MG capsule  Commonly known as:  PRILOSEC  Take 40 mg by mouth daily.     oxyCODONE-acetaminophen 5-325 MG per tablet  Commonly known as:   ROXICET  Take 1-2 tablets by mouth every 4 (four) hours as needed for severe pain.     PATADAY 0.2 % Soln  Generic drug:  Olopatadine HCl  Place 1 drop into both eyes daily.     vitamin E 400 UNIT capsule  Take 400 Units by mouth daily.        Diagnostic Studies: Dg Chest 2 View  08/24/2013   CLINICAL DATA:  Preop study prior to knee replacement  EXAM: CHEST  2 VIEW  COMPARISON:  PA and lateral chest x-ray May 10, 2012  FINDINGS: The lungs are borderline hypoinflated. There is no focal infiltrate. The cardiac silhouette is normal in size. The pulmonary vascularity is not engorged. The mediastinum is normal in width. There is no pleural effusion. There is a small hiatal hernia. The observed portions of the bony thorax appear normal.  IMPRESSION: There is no evidence of pneumonia nor CHF. The pulmonary interstitial markings are prominent but this may be due to crowding secondary to borderline hypo inflation. A hiatal hernia is present.   Electronically Signed   By: David  [place]   On: 08/24/2013 14:31    Disposition: 01-Home or Self Care  Discharge Orders   Future Appointments Provider Department Dept Phone   11/28/2013 1:30 PM Philmore Pali, NP [facility] [phone]   Future Orders Complete By Expires   Call MD / Call 911  As directed    Constipation Prevention  As directed    Diet - low sodium heart healthy  As directed    Discharge instructions  As directed    Do not put a pillow under the knee. Place it under the heel.  As directed    Full weight bearing  As directed    Questions:     Laterality:     Extremity:     Increase activity slowly as tolerated  As directed     Keep knee incision dry for 5 days post op then may wet while bathing. Therapy daily and  goal full extension and greater than 90 degrees flexion. Call if fever or chills or increased drainage. Go to ER if acutely short of breath or call for ambulance. Return for follow up in 2 weeks. May  full weight bear on the surgical  leg unless told otherwise. Use knee immobilizer until able to straight leg raise off bed with knee stable. In house walking for first 2 weeks.  Follow-up Information   Follow up with Marybelle Killings, MD. Schedule an appointment as soon as possible for a visit in 2 weeks.   Specialty:  Orthopedic Surgery   Contact information:   [place] [address] [phone]       Follow up with [facility]. (Someone from [facility] will contact you concerning physical therapy start date and time.)    Contact information:   [address] [place] [address] [phone]        Signed: Epimenio Foot 09/05/2013, 8:25 AM

## 2013-09-06 DIAGNOSIS — J45909 Unspecified asthma, uncomplicated: Secondary | ICD-10-CM | POA: Diagnosis not present

## 2013-09-06 DIAGNOSIS — IMO0001 Reserved for inherently not codable concepts without codable children: Secondary | ICD-10-CM | POA: Diagnosis not present

## 2013-09-06 DIAGNOSIS — Z471 Aftercare following joint replacement surgery: Secondary | ICD-10-CM | POA: Diagnosis not present

## 2013-09-06 DIAGNOSIS — I10 Essential (primary) hypertension: Secondary | ICD-10-CM | POA: Diagnosis not present

## 2013-09-06 DIAGNOSIS — E669 Obesity, unspecified: Secondary | ICD-10-CM | POA: Diagnosis not present

## 2013-09-06 DIAGNOSIS — Z8673 Personal history of transient ischemic attack (TIA), and cerebral infarction without residual deficits: Secondary | ICD-10-CM | POA: Diagnosis not present

## 2013-09-06 DIAGNOSIS — H8309 Labyrinthitis, unspecified ear: Secondary | ICD-10-CM | POA: Diagnosis not present

## 2013-09-06 DIAGNOSIS — M159 Polyosteoarthritis, unspecified: Secondary | ICD-10-CM | POA: Diagnosis not present

## 2013-09-06 DIAGNOSIS — Z96659 Presence of unspecified artificial knee joint: Secondary | ICD-10-CM | POA: Diagnosis not present

## 2013-09-07 DIAGNOSIS — H8309 Labyrinthitis, unspecified ear: Secondary | ICD-10-CM | POA: Diagnosis not present

## 2013-09-07 DIAGNOSIS — J45909 Unspecified asthma, uncomplicated: Secondary | ICD-10-CM | POA: Diagnosis not present

## 2013-09-07 DIAGNOSIS — IMO0001 Reserved for inherently not codable concepts without codable children: Secondary | ICD-10-CM | POA: Diagnosis not present

## 2013-09-07 DIAGNOSIS — Z471 Aftercare following joint replacement surgery: Secondary | ICD-10-CM | POA: Diagnosis not present

## 2013-09-07 DIAGNOSIS — M159 Polyosteoarthritis, unspecified: Secondary | ICD-10-CM | POA: Diagnosis not present

## 2013-09-10 DIAGNOSIS — H8309 Labyrinthitis, unspecified ear: Secondary | ICD-10-CM | POA: Diagnosis not present

## 2013-09-10 DIAGNOSIS — J45909 Unspecified asthma, uncomplicated: Secondary | ICD-10-CM | POA: Diagnosis not present

## 2013-09-10 DIAGNOSIS — IMO0001 Reserved for inherently not codable concepts without codable children: Secondary | ICD-10-CM | POA: Diagnosis not present

## 2013-09-10 DIAGNOSIS — Z471 Aftercare following joint replacement surgery: Secondary | ICD-10-CM | POA: Diagnosis not present

## 2013-09-10 DIAGNOSIS — M159 Polyosteoarthritis, unspecified: Secondary | ICD-10-CM | POA: Diagnosis not present

## 2013-09-12 DIAGNOSIS — Z471 Aftercare following joint replacement surgery: Secondary | ICD-10-CM | POA: Diagnosis not present

## 2013-09-12 DIAGNOSIS — H8309 Labyrinthitis, unspecified ear: Secondary | ICD-10-CM | POA: Diagnosis not present

## 2013-09-12 DIAGNOSIS — M159 Polyosteoarthritis, unspecified: Secondary | ICD-10-CM | POA: Diagnosis not present

## 2013-09-12 DIAGNOSIS — J45909 Unspecified asthma, uncomplicated: Secondary | ICD-10-CM | POA: Diagnosis not present

## 2013-09-12 DIAGNOSIS — IMO0001 Reserved for inherently not codable concepts without codable children: Secondary | ICD-10-CM | POA: Diagnosis not present

## 2013-09-14 DIAGNOSIS — M159 Polyosteoarthritis, unspecified: Secondary | ICD-10-CM | POA: Diagnosis not present

## 2013-09-14 DIAGNOSIS — Z471 Aftercare following joint replacement surgery: Secondary | ICD-10-CM | POA: Diagnosis not present

## 2013-09-14 DIAGNOSIS — H8309 Labyrinthitis, unspecified ear: Secondary | ICD-10-CM | POA: Diagnosis not present

## 2013-09-14 DIAGNOSIS — J45909 Unspecified asthma, uncomplicated: Secondary | ICD-10-CM | POA: Diagnosis not present

## 2013-09-14 DIAGNOSIS — IMO0001 Reserved for inherently not codable concepts without codable children: Secondary | ICD-10-CM | POA: Diagnosis not present

## 2013-09-17 DIAGNOSIS — IMO0001 Reserved for inherently not codable concepts without codable children: Secondary | ICD-10-CM | POA: Diagnosis not present

## 2013-09-17 DIAGNOSIS — J45909 Unspecified asthma, uncomplicated: Secondary | ICD-10-CM | POA: Diagnosis not present

## 2013-09-17 DIAGNOSIS — H8309 Labyrinthitis, unspecified ear: Secondary | ICD-10-CM | POA: Diagnosis not present

## 2013-09-17 DIAGNOSIS — Z471 Aftercare following joint replacement surgery: Secondary | ICD-10-CM | POA: Diagnosis not present

## 2013-09-17 DIAGNOSIS — M159 Polyosteoarthritis, unspecified: Secondary | ICD-10-CM | POA: Diagnosis not present

## 2013-09-18 DIAGNOSIS — Z96659 Presence of unspecified artificial knee joint: Secondary | ICD-10-CM | POA: Diagnosis not present

## 2013-09-18 DIAGNOSIS — M1710 Unilateral primary osteoarthritis, unspecified knee: Secondary | ICD-10-CM | POA: Diagnosis not present

## 2013-09-19 DIAGNOSIS — J45909 Unspecified asthma, uncomplicated: Secondary | ICD-10-CM | POA: Diagnosis not present

## 2013-09-19 DIAGNOSIS — IMO0001 Reserved for inherently not codable concepts without codable children: Secondary | ICD-10-CM | POA: Diagnosis not present

## 2013-09-19 DIAGNOSIS — Z471 Aftercare following joint replacement surgery: Secondary | ICD-10-CM | POA: Diagnosis not present

## 2013-09-19 DIAGNOSIS — H8309 Labyrinthitis, unspecified ear: Secondary | ICD-10-CM | POA: Diagnosis not present

## 2013-09-19 DIAGNOSIS — M159 Polyosteoarthritis, unspecified: Secondary | ICD-10-CM | POA: Diagnosis not present

## 2013-09-21 DIAGNOSIS — Z471 Aftercare following joint replacement surgery: Secondary | ICD-10-CM | POA: Diagnosis not present

## 2013-09-21 DIAGNOSIS — IMO0001 Reserved for inherently not codable concepts without codable children: Secondary | ICD-10-CM | POA: Diagnosis not present

## 2013-09-21 DIAGNOSIS — H8309 Labyrinthitis, unspecified ear: Secondary | ICD-10-CM | POA: Diagnosis not present

## 2013-09-21 DIAGNOSIS — M159 Polyosteoarthritis, unspecified: Secondary | ICD-10-CM | POA: Diagnosis not present

## 2013-09-21 DIAGNOSIS — J45909 Unspecified asthma, uncomplicated: Secondary | ICD-10-CM | POA: Diagnosis not present

## 2013-09-26 DIAGNOSIS — M25519 Pain in unspecified shoulder: Secondary | ICD-10-CM | POA: Diagnosis not present

## 2013-09-26 DIAGNOSIS — R262 Difficulty in walking, not elsewhere classified: Secondary | ICD-10-CM | POA: Diagnosis not present

## 2013-09-26 DIAGNOSIS — M25619 Stiffness of unspecified shoulder, not elsewhere classified: Secondary | ICD-10-CM | POA: Diagnosis not present

## 2013-09-27 DIAGNOSIS — J309 Allergic rhinitis, unspecified: Secondary | ICD-10-CM | POA: Diagnosis not present

## 2013-09-28 DIAGNOSIS — M25519 Pain in unspecified shoulder: Secondary | ICD-10-CM | POA: Diagnosis not present

## 2013-09-28 DIAGNOSIS — M25619 Stiffness of unspecified shoulder, not elsewhere classified: Secondary | ICD-10-CM | POA: Diagnosis not present

## 2013-09-28 DIAGNOSIS — R262 Difficulty in walking, not elsewhere classified: Secondary | ICD-10-CM | POA: Diagnosis not present

## 2013-10-01 DIAGNOSIS — R262 Difficulty in walking, not elsewhere classified: Secondary | ICD-10-CM | POA: Diagnosis not present

## 2013-10-01 DIAGNOSIS — M25619 Stiffness of unspecified shoulder, not elsewhere classified: Secondary | ICD-10-CM | POA: Diagnosis not present

## 2013-10-01 DIAGNOSIS — M25519 Pain in unspecified shoulder: Secondary | ICD-10-CM | POA: Diagnosis not present

## 2013-10-03 DIAGNOSIS — M25519 Pain in unspecified shoulder: Secondary | ICD-10-CM | POA: Diagnosis not present

## 2013-10-03 DIAGNOSIS — M25619 Stiffness of unspecified shoulder, not elsewhere classified: Secondary | ICD-10-CM | POA: Diagnosis not present

## 2013-10-03 DIAGNOSIS — R262 Difficulty in walking, not elsewhere classified: Secondary | ICD-10-CM | POA: Diagnosis not present

## 2013-10-04 DIAGNOSIS — J309 Allergic rhinitis, unspecified: Secondary | ICD-10-CM | POA: Diagnosis not present

## 2013-10-05 DIAGNOSIS — M25619 Stiffness of unspecified shoulder, not elsewhere classified: Secondary | ICD-10-CM | POA: Diagnosis not present

## 2013-10-05 DIAGNOSIS — R262 Difficulty in walking, not elsewhere classified: Secondary | ICD-10-CM | POA: Diagnosis not present

## 2013-10-05 DIAGNOSIS — M25519 Pain in unspecified shoulder: Secondary | ICD-10-CM | POA: Diagnosis not present

## 2013-10-08 DIAGNOSIS — M25619 Stiffness of unspecified shoulder, not elsewhere classified: Secondary | ICD-10-CM | POA: Diagnosis not present

## 2013-10-08 DIAGNOSIS — R262 Difficulty in walking, not elsewhere classified: Secondary | ICD-10-CM | POA: Diagnosis not present

## 2013-10-08 DIAGNOSIS — M25519 Pain in unspecified shoulder: Secondary | ICD-10-CM | POA: Diagnosis not present

## 2013-10-09 ENCOUNTER — Telehealth: Payer: Self-pay | Admitting: *Deleted

## 2013-10-09 DIAGNOSIS — J309 Allergic rhinitis, unspecified: Secondary | ICD-10-CM | POA: Diagnosis not present

## 2013-10-09 MED ORDER — FUROSEMIDE 40 MG PO TABS: 40.0000 mg | ORAL_TABLET | Freq: Every day | ORAL | Status: DC

## 2013-10-09 NOTE — Telephone Encounter (Signed)
Refill done.  

## 2013-10-10 DIAGNOSIS — M25619 Stiffness of unspecified shoulder, not elsewhere classified: Secondary | ICD-10-CM | POA: Diagnosis not present

## 2013-10-10 DIAGNOSIS — R262 Difficulty in walking, not elsewhere classified: Secondary | ICD-10-CM | POA: Diagnosis not present

## 2013-10-10 DIAGNOSIS — M25519 Pain in unspecified shoulder: Secondary | ICD-10-CM | POA: Diagnosis not present

## 2013-10-12 DIAGNOSIS — R262 Difficulty in walking, not elsewhere classified: Secondary | ICD-10-CM | POA: Diagnosis not present

## 2013-10-12 DIAGNOSIS — M25619 Stiffness of unspecified shoulder, not elsewhere classified: Secondary | ICD-10-CM | POA: Diagnosis not present

## 2013-10-12 DIAGNOSIS — M25519 Pain in unspecified shoulder: Secondary | ICD-10-CM | POA: Diagnosis not present

## 2013-10-15 DIAGNOSIS — M25519 Pain in unspecified shoulder: Secondary | ICD-10-CM | POA: Diagnosis not present

## 2013-10-15 DIAGNOSIS — R262 Difficulty in walking, not elsewhere classified: Secondary | ICD-10-CM | POA: Diagnosis not present

## 2013-10-15 DIAGNOSIS — M25619 Stiffness of unspecified shoulder, not elsewhere classified: Secondary | ICD-10-CM | POA: Diagnosis not present

## 2013-10-18 DIAGNOSIS — R262 Difficulty in walking, not elsewhere classified: Secondary | ICD-10-CM | POA: Diagnosis not present

## 2013-10-18 DIAGNOSIS — M25619 Stiffness of unspecified shoulder, not elsewhere classified: Secondary | ICD-10-CM | POA: Diagnosis not present

## 2013-10-18 DIAGNOSIS — M25519 Pain in unspecified shoulder: Secondary | ICD-10-CM | POA: Diagnosis not present

## 2013-10-19 DIAGNOSIS — J309 Allergic rhinitis, unspecified: Secondary | ICD-10-CM | POA: Diagnosis not present

## 2013-10-26 DIAGNOSIS — J309 Allergic rhinitis, unspecified: Secondary | ICD-10-CM | POA: Diagnosis not present

## 2013-10-26 DIAGNOSIS — L219 Seborrheic dermatitis, unspecified: Secondary | ICD-10-CM | POA: Diagnosis not present

## 2013-11-01 DIAGNOSIS — J309 Allergic rhinitis, unspecified: Secondary | ICD-10-CM | POA: Diagnosis not present

## 2013-11-05 ENCOUNTER — Other Ambulatory Visit: Payer: Self-pay | Admitting: Family Medicine

## 2013-11-07 DIAGNOSIS — J309 Allergic rhinitis, unspecified: Secondary | ICD-10-CM | POA: Diagnosis not present

## 2013-11-14 ENCOUNTER — Telehealth: Payer: Self-pay

## 2013-11-14 NOTE — Telephone Encounter (Signed)
Pt called stating that she needed medication refills. i attempted to call pt, left a message for pt to call back.

## 2013-11-15 NOTE — Telephone Encounter (Signed)
Refill done.  

## 2013-11-19 ENCOUNTER — Ambulatory Visit (INDEPENDENT_AMBULATORY_CARE_PROVIDER_SITE_OTHER): Payer: Medicare Other | Admitting: Internal Medicine

## 2013-11-19 ENCOUNTER — Encounter: Payer: Self-pay | Admitting: Internal Medicine

## 2013-11-19 VITALS — BP 146/74 | HR 120 | Temp 97.8°F | Wt 203.0 lb

## 2013-11-19 DIAGNOSIS — J45909 Unspecified asthma, uncomplicated: Secondary | ICD-10-CM

## 2013-11-19 DIAGNOSIS — J309 Allergic rhinitis, unspecified: Secondary | ICD-10-CM | POA: Diagnosis not present

## 2013-11-19 DIAGNOSIS — I10 Essential (primary) hypertension: Secondary | ICD-10-CM | POA: Diagnosis not present

## 2013-11-19 DIAGNOSIS — IMO0001 Reserved for inherently not codable concepts without codable children: Secondary | ICD-10-CM | POA: Diagnosis not present

## 2013-11-19 DIAGNOSIS — R000 Tachycardia, unspecified: Secondary | ICD-10-CM | POA: Diagnosis not present

## 2013-11-19 DIAGNOSIS — E119 Type 2 diabetes mellitus without complications: Secondary | ICD-10-CM | POA: Diagnosis not present

## 2013-11-19 DIAGNOSIS — I471 Supraventricular tachycardia, unspecified: Secondary | ICD-10-CM | POA: Insufficient documentation

## 2013-11-19 NOTE — Assessment & Plan Note (Signed)
Previous A1c's discussed, she has diabetes, she is already taken steps to exercise more and eat healthier, has lost ~ 10 pounds per patient. Plan: Labs Continue with her healthier lifestyle

## 2013-11-19 NOTE — Assessment & Plan Note (Signed)
Well-controlled, hardly ever uses albuterol

## 2013-11-19 NOTE — Assessment & Plan Note (Signed)
Sees rheumatology for management of fibromyalgia, DJD and gout

## 2013-11-19 NOTE — Progress Notes (Signed)
Subjective:    Patient ID: Kristy [name], female    DOB: 1946-04-15, 68 y.o.   MRN: [id number]  DOS:  11/19/2013 Type of  Visit:  New  patient, transferring from Dr. Linda Hedges History: chart and labs are reviewed. History of asthma, good compliance with medications, hardly ever uses albuterol Hypertension, good compliance of medication, ambulatory BPs 130/70. She has a history of chronic tachycardia, heart rate at rest usually 90 or more, HR goes up to 140 w/ exertion. She occasionally gets short of breath at rest. Otherwise she is active, go to a program similar to [employer] and when she does exercise very rarely she gets short of breath   ROS No  chest pain or palpitations, sometimes get lower extremity edema after walking, swelling gets better with rest. No  nausea, vomiting or blood in the stools. Occasional diarrhea, history of IBS  Past Medical History  Diagnosis Date  . Asthma   . Hypertension   . Hiatal hernia   . SUI (stress urinary incontinence, female)     (resolved after surgery)  . Atrophic vaginitis   . Fibromyalgia     Dr Amil Amen  . Shortness of breath     occ  . GERD (gastroesophageal reflux disease)   . DJD (degenerative joint disease)     generalized joint pain, including back  . TIA (transient ischemic attack)     07/2012, see Dr Leonie Man  . History of kidney stones   . Vertigo   . Gout     Past Surgical History  Procedure Laterality Date  . Abdominal hysterectomy  1994    TAH,BSO-BLADDER NECK SUSPENSION  . Surgery for poserior tibial tendon  2003  . Cystocele repair  1994  . Cholecystectomy, laparoscopic  2001  . Tubal ligation    . Carpal tunnel release Right 2005  . Triple fusion left ankle Left 2007  . Oophorectomy      BSO  . Breast surgery      LUMPECTOMY-BENIGN Breast Bx X2  . Tonsillectomy    . Knee arthroscopy      bil  . Knee arthroplasty  05/19/2012    Procedure: COMPUTER ASSISTED TOTAL KNEE ARTHROPLASTY;  Surgeon: Marybelle Killings, MD;   Location: [facility];  Service: Orthopedics;  Laterality: Left;  Left  Total Knee Arthroplasty  . Knee arthroplasty Right 09/03/2013    Procedure: COMPUTER ASSISTED TOTAL KNEE ARTHROPLASTY;  Surgeon: Marybelle Killings, MD;  Location: [place];  Service: Orthopedics;  Laterality: Right;  Right Total Knee Arthroplasty, Computer Assist    History   Social History  . Marital Status: Married    Spouse Name: [name]    Number of Children: 1  . Years of Education: 16   Occupational History  . 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 Smoking status: Never Smoker   . Smokeless tobacco: Never Used  . Alcohol Use: Yes     Comment: rare  . Drug Use: No  . Sexual Activity: No   Other Topics Concern  . 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 married '69. 2 sons - '70, '72: retired - [employer]. SO- good health.   ACP - OK for CPR at least once, no long term ventilation, no heroic measures in the face of poor quality of life. HCPOA - husband, secondary son [name] (c) [phone]  Medication List       This list is accurate as of: 11/19/13  5:22 PM.  Always use your most recent med list.               aspirin 325 MG tablet  Take 1 tablet (325 mg total) by mouth daily.     ASTEPRO 0.15 % Soln  Generic drug:  Azelastine HCl  Place 2 sprays into the nose daily.     beclomethasone 80 MCG/ACT inhaler  Commonly known as:  QVAR  Inhale 1 puff into the lungs 2 (two) times daily.     diltiazem 240 MG 24 hr capsule  Commonly known as:  CARDIZEM CD  Take 240 mg by mouth daily.     EPIPEN 0.3 mg/0.3 mL Devi  Generic drug:  EPINEPHrine  Inject 0.3 mg into the muscle as needed (anaphylactic). For allergic reactions     furosemide 40 MG tablet  Commonly known as:  LASIX  TAKE 1 TABLET BY MOUTH DAILY     HM VITAMIN D3 2000 UNITS Caps  Generic drug:  Cholecalciferol  Take 2,000 Units by mouth daily.     levalbuterol 45 MCG/ACT  inhaler  Commonly known as:  XOPENEX HFA  Inhale 2 puffs into the lungs every 4 (four) hours as needed for shortness of breath.     levocetirizine 5 MG tablet  Commonly known as:  XYZAL  Take 5 mg by mouth daily.     losartan 50 MG tablet  Commonly known as:  COZAAR  Take 50 mg by mouth daily.     NONFORMULARY OR COMPOUNDED ITEM  - Estradiol .02%  - 1 ML Prefilled Applicator  - Sig: apply vaginally twice a week  - #90 Day Supply with 4 refills     nortriptyline 25 MG capsule  Commonly known as:  PAMELOR  Take 25 mg by mouth at bedtime.     omeprazole 40 MG capsule  Commonly known as:  PRILOSEC  Take 40 mg by mouth daily.     PATADAY 0.2 % Soln  Generic drug:  Olopatadine HCl  Place 1 drop into both eyes daily.     vitamin E 400 UNIT capsule  Take 400 Units by mouth daily.           Objective:   Physical Exam BP 146/74  Pulse 120  Temp(Src) 97.8 F (36.6 C)  Wt 203 lb (92.08 kg)  SpO2 95%  General -- alert, well-developed, NAD.  Neck --no thyromegaly  HEENT-- Not pale.  Lungs -- normal respiratory effort, no intercostal retractions, no accessory muscle use, and normal breath sounds.  Heart-- tachy, no murmur.  Abdomen-- Not distended, good bowel sounds,soft, non-tender.  Extremities-- no pretibial edema bilaterally  Neurologic--  alert & oriented X3. Speech normal, gait appropriate for age, strength symmetric and appropriate for age.   Psych-- Cognition and judgment appear intact. Cooperative with normal attention span and concentration. No anxious or depressed appearing.      Assessment & Plan:

## 2013-11-19 NOTE — Assessment & Plan Note (Addendum)
Reports a long history of tachycardia. On chart review her HR Is indeed usually in the 100s.. She is asymptomatic except for occasionally short of breath ; HR higher w/ exertion. No chest pain or palpitations. She had a normal ECHO 07-2012 after a TIA On 07/2013 EKG showed tachycardia the chest x-ray was negative Yars ago and saw Dr. Quay Burow d/t SOB, had a negative stress test-Holter monitor Plan: Check a TSH and CBC.  EKG-  Sinus tachy, QRS amplitude decrease compared to previous EKGs Order a Holter to document if she has only  sinus tachycardia or a tachyarrhythmia  Refer to cards

## 2013-11-19 NOTE — Assessment & Plan Note (Addendum)
Chart and pertinent labs reviewed  BP slightly elevated today, per chart review,BPs usually better. Plan: No change

## 2013-11-19 NOTE — Patient Instructions (Addendum)
Get your blood work before you leave   Next visit is for a physical exam in 3 months, fasting Please make an appointment    At some point this year the clinic will relocate to  [place] [facility] [address] (10 minutes form here)  [place]  [place], [place] [address] [phone]

## 2013-11-20 ENCOUNTER — Telehealth: Payer: Self-pay | Admitting: Internal Medicine

## 2013-11-20 LAB — CBC WITH DIFFERENTIAL/PLATELET
BASOS ABS: 0.1 10*3/uL (ref 0.0–0.1)
BASOS PCT: 0.5 % (ref 0.0–3.0)
EOS PCT: 2 % (ref 0.0–5.0)
Eosinophils Absolute: 0.2 10*3/uL (ref 0.0–0.7)
HEMATOCRIT: 44.8 % (ref 36.0–46.0)
Hemoglobin: 14.6 g/dL (ref 12.0–15.0)
Lymphocytes Relative: 26.3 % (ref 12.0–46.0)
Lymphs Abs: 2.6 10*3/uL (ref 0.7–4.0)
MCHC: 32.6 g/dL (ref 30.0–36.0)
MCV: 84.3 fl (ref 78.0–100.0)
Monocytes Absolute: 0.9 10*3/uL (ref 0.1–1.0)
Monocytes Relative: 9.1 % (ref 3.0–12.0)
Neutro Abs: 6.1 10*3/uL (ref 1.4–7.7)
Neutrophils Relative %: 62.1 % (ref 43.0–77.0)
Platelets: 349 10*3/uL (ref 150.0–400.0)
RBC: 5.31 Mil/uL — ABNORMAL HIGH (ref 3.87–5.11)
RDW: 13.6 % (ref 11.5–15.5)
WBC: 9.9 10*3/uL (ref 4.0–10.5)

## 2013-11-20 LAB — HEMOGLOBIN A1C: Hgb A1c MFr Bld: 6.8 % — ABNORMAL HIGH (ref 4.6–6.5)

## 2013-11-20 LAB — TSH: TSH: 1.52 u[IU]/mL (ref 0.35–4.50)

## 2013-11-20 NOTE — Telephone Encounter (Signed)
Relevant patient education assigned to patient using Emmi. ° °

## 2013-11-22 ENCOUNTER — Encounter: Payer: Self-pay | Admitting: *Deleted

## 2013-11-22 ENCOUNTER — Encounter (INDEPENDENT_AMBULATORY_CARE_PROVIDER_SITE_OTHER): Payer: Medicare Other

## 2013-11-22 DIAGNOSIS — R000 Tachycardia, unspecified: Secondary | ICD-10-CM | POA: Diagnosis not present

## 2013-11-22 NOTE — Progress Notes (Signed)
Patient ID: Kristy [name], female   DOB: January 31, 1946, 68 y.o.   MRN: [id number] E-Cardio 48 hour holter monitor applied to patient.

## 2013-11-22 NOTE — Telephone Encounter (Signed)
lmtcb

## 2013-11-26 ENCOUNTER — Other Ambulatory Visit: Payer: Self-pay | Admitting: Internal Medicine

## 2013-11-26 DIAGNOSIS — J309 Allergic rhinitis, unspecified: Secondary | ICD-10-CM | POA: Diagnosis not present

## 2013-11-28 ENCOUNTER — Encounter: Payer: Self-pay | Admitting: Nurse Practitioner

## 2013-11-28 ENCOUNTER — Ambulatory Visit (INDEPENDENT_AMBULATORY_CARE_PROVIDER_SITE_OTHER): Payer: Medicare Other | Admitting: Nurse Practitioner

## 2013-11-28 VITALS — BP 151/86 | HR 109 | Wt 205.0 lb

## 2013-11-28 DIAGNOSIS — G459 Transient cerebral ischemic attack, unspecified: Secondary | ICD-10-CM

## 2013-11-28 NOTE — Patient Instructions (Signed)
Continue aspirin 325 mg orally every day  for secondary stroke prevention and maintain strict control of hypertension with blood pressure goal below 140/90, diabetes with hemoglobin A1c goal below 6.5% and lipids with LDL cholesterol goal below 100 mg/dL. Return for followup in 6 months, sooner as needed.  Stroke Prevention Some medical conditions and behaviors are associated with an increased chance of having a stroke. You may prevent a stroke by making healthy choices and managing medical conditions. HOW CAN I REDUCE MY RISK OF HAVING A STROKE?   Stay physically active. Get at least 30 minutes of activity on most or all days.  Do not smoke. It may also be helpful to avoid exposure to secondhand smoke.  Limit alcohol use. Moderate alcohol use is considered to be:  No more than 2 drinks per day for men.  No more than 1 drink per day for nonpregnant women.  Eat healthy foods. This involves  Eating 5 or more servings of fruits and vegetables a day.  Following a diet that addresses high blood pressure (hypertension), high cholesterol, diabetes, or obesity.  Manage your cholesterol levels.  A diet low in saturated fat, trans fat, and cholesterol and high in fiber may control cholesterol levels.  Take any prescribed medicines to control cholesterol as directed by your health care provider.  Manage your diabetes.  A controlled-carbohydrate, controlled-sugar diet is recommended to manage diabetes.  Take any prescribed medicines to control diabetes as directed by your health care provider.  Control your hypertension.  A low-salt (sodium), low-saturated fat, low-trans fat, and low-cholesterol diet is recommended to manage hypertension.  Take any prescribed medicines to control hypertension as directed by your health care provider.  Maintain a healthy weight.  A reduced-calorie, low-sodium, low-saturated fat, low-trans fat, low-cholesterol diet is recommended to manage weight.  Stop  drug abuse.  Avoid taking birth control pills.  Talk to your health care provider about the risks of taking birth control pills if you are over 61 years old, smoke, get migraines, or have ever had a blood clot.  Get evaluated for sleep disorders (sleep apnea).  Talk to your health care provider about getting a sleep evaluation if you snore a lot or have excessive sleepiness.  Take medicines as directed by your health care provider.  For some people, aspirin or blood thinners (anticoagulants) are helpful in reducing the risk of forming abnormal blood clots that can lead to stroke. If you have the irregular heart rhythm of atrial fibrillation, you should be on a blood thinner unless there is a good reason you cannot take them.  Understand all your medicine instructions.  Make sure that other other conditions (such as anemia or atherosclerosis) are addressed. SEEK IMMEDIATE MEDICAL CARE IF:   You have sudden weakness or numbness of the face, arm, or leg, especially on one side of the body.  Your face or eyelid droops to one side.  You have sudden confusion.  You have trouble speaking (aphasia) or understanding.  You have sudden trouble seeing in one or both eyes.  You have sudden trouble walking.  You have dizziness.  You have a loss of balance or coordination.  You have a sudden, severe headache with no known cause.  You have new chest pain or an irregular heartbeat. Any of these symptoms may represent a serious problem that is an emergency. Do not wait to see if the symptoms will go away. Get medical help at once. Call your local emergency services  (911 in  [place] Do not drive yourself to the hospital. Document Released: 06/24/2004 Document Revised: 03/07/2013 Document Reviewed: 11/17/2012 [facility] Patient Information 2015 [place], [place]. This information is not intended to replace advice given to you by your health care provider. Make sure you discuss any questions you have  with your health care provider.

## 2013-11-28 NOTE — Progress Notes (Signed)
PATIENT: Kristy [name] DOB: [birth year]  REASON FOR VISIT: routine follow up for TIA HISTORY FROM: patient  HISTORY OF PRESENT ILLNESS: 68 year old Caucasian lady seen for her first office followup visit following hospital admission for TIA. She was admitted on 07/14/12 with sudden onset of numbness involving the left face and slurred speech following a brief occipital headache. Her headache and speech symptoms resolved but left facial numbness persisted. NIH stroke scale on admission was 0. CT scan was unremarkable. MRI scan did not show any acute infarct. MRA of the brain showed no large vessel stenosis. Lipid profile was normal hemoglobin A1c was borderline at 6.4%. Transthoracic echo showed normal ejection fraction without cardiac source of embolism. MRA of the neck showed only mild plaque without hemodynamically significant stenosis. She was started on aspirin for stroke prevention. She denied history of migraine headaches. She states she has done well since discharge; her headache and speech problems have completely resolved. She states her blood pressure has been under good control and it is 128/7 in office today. She has no new complaints. She plans to exercise regularly and start going to a swimming pool.   UPDATE 03/01/13 (LL): Kristy [name] comes in for TIA followup. She has had no recurrent TIA symptoms, but occasionally gets a brief sharp pain in her head. She has an area on her left cheek that she says has decreased sensation since the TIA. She has difficulty with dizziness and motion sickness, and sometimes falls for no reason. She sometimes uses a cane to help with balance. She denies weakness in her legs but has had total knee replacement on the left and needs the right side done, plans are for next month. She states her left ankle has 4 screws in it as well. She states her blood pressure is well controlled, is 142/83 in office today.   UPDATE 11/28/13 (LL):  Since last visit, patient had  right total knee replacement, without complication.  Since surgery she has been much more mobile and can walk much longer distances.  She still feels somewhat off-balance at times. She has had no recurrent neurovascular symptoms.  Blood pressure is slightly elevated today at 151/86.  She is tolerating aspirin well with no signs of significant bleeding or bruising.  She has had more palpitations lately and wore a holter monitor 48 hours last week but does not know the results.  Her HR is 109 in the office today, sitting down.  Otherwise, she feels well.  ROS:  14 system review of systems is positive for dizziness, hearing loss, light sensitivity, shortness of breath, palpitations, env allergies, food allergies. All others negative.   ALLERGIES: Allergies  Allergen Reactions  . Iodine Anaphylaxis  . Shellfish Allergy Anaphylaxis  . Sulfonamide Derivatives Rash  . Other Swelling    Citrus  Sometimes Lactose intolerant  . Amoxicillin Diarrhea    HOME MEDICATIONS: Outpatient Prescriptions Prior to Visit  Medication Sig Dispense Refill  . aspirin 325 MG tablet Take 1 tablet (325 mg total) by mouth daily.      . Azelastine HCl (ASTEPRO) 0.15 % SOLN Place 2 sprays into the nose daily.      . beclomethasone (QVAR) 80 MCG/ACT inhaler Inhale 1 puff into the lungs 2 (two) times daily.      . Cholecalciferol (HM VITAMIN D3) 2000 UNITS CAPS Take 2,000 Units by mouth daily.      Marland Kitchen diltiazem (CARDIZEM CD) 240 MG 24 hr capsule Take 240 mg by mouth daily.      Marland Kitchen  EPINEPHrine (EPIPEN) 0.3 mg/0.3 mL DEVI Inject 0.3 mg into the muscle as needed (anaphylactic). For allergic reactions      . furosemide (LASIX) 40 MG tablet TAKE 1 TABLET BY MOUTH DAILY  30 tablet  1  . levalbuterol (XOPENEX HFA) 45 MCG/ACT inhaler Inhale 2 puffs into the lungs every 4 (four) hours as needed for shortness of breath.       . levocetirizine (XYZAL) 5 MG tablet Take 5 mg by mouth daily.      Marland Kitchen losartan (COZAAR) 50 MG tablet TAKE 1  TABLET EVERY MORNING  30 tablet  6  . NONFORMULARY OR COMPOUNDED ITEM Estradiol .02% 1 ML Prefilled Applicator Sig: apply vaginally twice a week #90 Day Supply with 4 refills  1 each  4  . nortriptyline (PAMELOR) 25 MG capsule Take 25 mg by mouth at bedtime.        . Olopatadine HCl (PATADAY) 0.2 % SOLN Place 1 drop into both eyes daily.      Marland Kitchen omeprazole (PRILOSEC) 40 MG capsule Take 40 mg by mouth daily.      . vitamin E 400 UNIT capsule Take 400 Units by mouth daily.       No facility-administered medications prior to visit.    PHYSICAL EXAM There were no vitals filed for this visit. There is no weight on file to calculate BMI.  General: Obese middle-aged Caucasian lady, seated, in no evident distress  Head: head normocephalic and atraumatic. Orohparynx benign  Neck: supple with no carotid or supraclavicular bruits  Cardiovascular: regular rate and rhythm, no murmurs  Musculoskeletal: no deformity  Skin: no rash/petichiae  Vascular: Normal pulses all extremities  Neurologic Exam  Mental Status: Awake and fully alert. Oriented to place and time. Recent and remote memory intact. Attention span, concentration and fund of knowledge appropriate. Mood and affect appropriate.  Cranial Nerves: Pupils equal, briskly reactive to light. Extraocular movements full without nystagmus. Visual fields full to confrontation. Hearing intact. Facial sensation intact. Face, tongue, palate moves normally and symmetrically.  Motor: Normal bulk and tone. Normal strength in all tested extremity muscles.  Sensory.: intact to tough and pinprick and vibratory.  Coordination: Rapid alternating movements normal in all extremities. Finger-to-nose and heel-to-shin performed accurately bilaterally.  Gait and Station: Arises from chair without difficulty. Stance is normal. Gait demonstrates normal stride length and balance . Unable to heel, toe and tandem walk without difficulty.  Reflexes: 1+ and symmetric. Toes  downgoing.   MRI HEAD WITHOUT AND WITH CONTRAST 07/15/12  Mild atrophy. Mild to moderate chronic microvascular ischemic change. No acute stroke. No abnormal enhancement.  MRA HEAD WITHOUT CONTRAST 07/15/12  Unremarkable MR angiography intracranial circulation.  MRA NECK WITHOUT AND WITH CONTRAST 07/15/12  Mild posterior wall plaque right internal carotid artery origin, without flow reducing stenosis or dissection. Otherwise unremarkable study.   ASSESSMENT AND PLAN  52 year lady with a right brain TIA in February 2014 likely due to small vessel disease. Vascular risk factors of hypertension, age and sex only. Doing well, with no recurrent symptoms.  PLAN:  Continue aspirin 325 mg orally every day  for secondary stroke prevention and maintain strict control of hypertension with blood pressure goal below 140/90, diabetes with hemoglobin A1c goal below 6.5% and lipids with LDL cholesterol goal below 100 mg/dL. Return for followup in 6 months, sooner as needed.   Return in about 6 months (around 05/31/2014) for TIA follow up.  Rudi Rummage Charnelle Bergeman, MSN, NP-C 11/28/2013, 4:46 PM [facility] [address], [place], [place] [address] [phone]  Note: This document was prepared with digital dictation and possible smart phrase technology. Any transcriptional errors that result from this process are unintentional.

## 2013-11-30 NOTE — Progress Notes (Signed)
I agree with above 

## 2013-12-05 DIAGNOSIS — Z96659 Presence of unspecified artificial knee joint: Secondary | ICD-10-CM | POA: Diagnosis not present

## 2013-12-05 DIAGNOSIS — J309 Allergic rhinitis, unspecified: Secondary | ICD-10-CM | POA: Diagnosis not present

## 2013-12-05 DIAGNOSIS — M1710 Unilateral primary osteoarthritis, unspecified knee: Secondary | ICD-10-CM | POA: Diagnosis not present

## 2013-12-14 DIAGNOSIS — J309 Allergic rhinitis, unspecified: Secondary | ICD-10-CM | POA: Diagnosis not present

## 2013-12-17 DIAGNOSIS — J309 Allergic rhinitis, unspecified: Secondary | ICD-10-CM | POA: Diagnosis not present

## 2013-12-18 ENCOUNTER — Telehealth: Payer: Self-pay

## 2013-12-18 DIAGNOSIS — E7800 Pure hypercholesterolemia, unspecified: Secondary | ICD-10-CM

## 2013-12-18 NOTE — Telephone Encounter (Signed)
Diabetic Bundle  mychart message sent  Needs lipid panel and BP rechecked  Lab order placed

## 2013-12-18 NOTE — Telephone Encounter (Signed)
Noted  

## 2013-12-19 ENCOUNTER — Other Ambulatory Visit (INDEPENDENT_AMBULATORY_CARE_PROVIDER_SITE_OTHER): Payer: Medicare Other

## 2013-12-19 ENCOUNTER — Ambulatory Visit: Payer: Medicare Other | Admitting: *Deleted

## 2013-12-19 VITALS — BP 120/60 | HR 97

## 2013-12-19 DIAGNOSIS — E7800 Pure hypercholesterolemia, unspecified: Secondary | ICD-10-CM | POA: Diagnosis not present

## 2013-12-19 DIAGNOSIS — Z0130 Encounter for examination of blood pressure without abnormal findings: Secondary | ICD-10-CM

## 2013-12-19 DIAGNOSIS — I10 Essential (primary) hypertension: Secondary | ICD-10-CM

## 2013-12-19 LAB — LIPID PANEL
Cholesterol: 155 mg/dL (ref 0–200)
HDL: 30.7 mg/dL — AB (ref >39.00)
NonHDL: 124.3
TRIGLYCERIDES: 212 mg/dL — AB (ref 0.0–149.0)
Total CHOL/HDL Ratio: 5
VLDL: 42.4 mg/dL — ABNORMAL HIGH (ref 0.0–40.0)

## 2013-12-19 LAB — LDL CHOLESTEROL, DIRECT: Direct LDL: 101.8 mg/dL

## 2013-12-26 DIAGNOSIS — J309 Allergic rhinitis, unspecified: Secondary | ICD-10-CM | POA: Diagnosis not present

## 2014-01-04 DIAGNOSIS — J309 Allergic rhinitis, unspecified: Secondary | ICD-10-CM | POA: Diagnosis not present

## 2014-01-07 ENCOUNTER — Other Ambulatory Visit: Payer: Self-pay | Admitting: Internal Medicine

## 2014-01-07 ENCOUNTER — Encounter: Payer: Self-pay | Admitting: Cardiovascular Disease

## 2014-01-07 ENCOUNTER — Ambulatory Visit (INDEPENDENT_AMBULATORY_CARE_PROVIDER_SITE_OTHER): Payer: Medicare Other | Admitting: Cardiovascular Disease

## 2014-01-07 VITALS — BP 124/84 | HR 104 | Ht 65.0 in | Wt 202.9 lb

## 2014-01-07 DIAGNOSIS — R002 Palpitations: Secondary | ICD-10-CM

## 2014-01-07 DIAGNOSIS — R000 Tachycardia, unspecified: Secondary | ICD-10-CM | POA: Diagnosis not present

## 2014-01-07 DIAGNOSIS — I10 Essential (primary) hypertension: Secondary | ICD-10-CM | POA: Diagnosis not present

## 2014-01-07 MED ORDER — METOPROLOL SUCCINATE ER 25 MG PO TB24: 25.0000 mg | ORAL_TABLET | Freq: Every day | ORAL | Status: DC

## 2014-01-07 NOTE — Progress Notes (Signed)
01/07/2014 Kristy [name]   [birth year]  [id number]  Primary Physician Kristy November, MD Primary Cardiologist: Kristy Harp MD Kristy [name]   HPI:  [name] is a 68 year old moderately overweight married Caucasian female mother of 2 sons referred by Dr. Larose [name] for evaluation of opticians and PSVT. Her cardiac risk factor profile is notable for treated hypertension. She's had a TIA 07/14/12 followed by Dr. Leonie [name]. She's never had a heart attack. She has normal LV function by 2-D echocardiogram last year. 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 She complained of some palpitations and a 48 hour Holter monitor showed sinus rhythm, sinus tachycardia, PACs and short runs of PSVT.   Current Outpatient Prescriptions  Medication Sig Dispense Refill  . aspirin 325 MG tablet Take 1 tablet (325 mg total) by mouth daily.      . Azelastine HCl (ASTEPRO) 0.15 % SOLN Place 2 sprays into the nose daily.      . beclomethasone (QVAR) 80 MCG/ACT inhaler Inhale 1 puff into the lungs 2 (two) times daily.      . Cholecalciferol (HM VITAMIN D3) 2000 UNITS CAPS Take 2,000 Units by mouth daily.      Marland Kitchen diltiazem (CARDIZEM CD) 240 MG 24 hr capsule Take 240 mg by mouth daily.      Marland Kitchen EPINEPHrine (EPIPEN) 0.3 mg/0.3 mL DEVI Inject 0.3 mg into the muscle as needed (anaphylactic). For allergic reactions      . furosemide (LASIX) 40 MG tablet TAKE 1 TABLET BY MOUTH DAILY  30 tablet  1  . levalbuterol (XOPENEX HFA) 45 MCG/ACT inhaler Inhale 2 puffs into the lungs every 4 (four) hours as needed for shortness of breath.       . levocetirizine (XYZAL) 5 MG tablet Take 5 mg by mouth daily.      Marland Kitchen losartan (COZAAR) 50 MG tablet TAKE 1 TABLET EVERY MORNING  30 tablet  6  . NONFORMULARY OR COMPOUNDED ITEM Estradiol .02% 1 ML Prefilled Applicator Sig: apply vaginally twice a week #90 Day Supply with 4 refills  1 each  4  . nortriptyline (PAMELOR) 25 MG capsule Take 25 mg by mouth at bedtime.          . Olopatadine HCl (PATADAY) 0.2 % SOLN Place 1 drop into both eyes daily.      Marland Kitchen omeprazole (PRILOSEC) 40 MG capsule Take 40 mg by mouth daily.      . vitamin E 400 UNIT capsule Take 400 Units by mouth daily.      . metoprolol succinate (TOPROL XL) 25 MG 24 hr tablet Take 1 tablet (25 mg total) by mouth daily.  30 tablet  6   No current facility-administered medications for this visit.    Allergies  Allergen Reactions  . Iodine Anaphylaxis  . Shellfish Allergy Anaphylaxis  . Sulfonamide Derivatives Rash  . Other Swelling    Citrus  Sometimes Lactose intolerant  . Amoxicillin Diarrhea    History   Social History  . Marital Status: Married    Spouse Name: Kristy [name]    Number of Children: 1  . Years of Education: 16   Occupational History  . 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 Smoking status: Never Smoker   . Smokeless tobacco: Never Used  . Alcohol Use: Yes     Comment: rare  . Drug Use: No  . Sexual Activity: No   Other Topics Concern  .  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 married '69. 2 sons - '70, '72: retired - [employer]. SO- good health.   ACP - OK for CPR at least once, no long term ventilation, no heroic measures in the face of poor quality of life. HCPOA - husband, secondary son Kristy [name] (c) [phone]         Review of Systems: General: negative for chills, fever, night sweats or weight changes.  Cardiovascular: negative for chest pain, dyspnea on exertion, edema, orthopnea, palpitations, paroxysmal nocturnal dyspnea or shortness of breath Dermatological: negative for rash Respiratory: negative for cough or wheezing Urologic: negative for hematuria Abdominal: negative for nausea, vomiting, diarrhea, bright red blood per rectum, melena, or hematemesis Neurologic: negative for visual changes, syncope, or dizziness All other systems reviewed and are otherwise negative except as noted  above.    Blood pressure 124/84, pulse 104, height 5\' 5"  (1.651 m), weight 202 lb 14.4 oz (92.035 kg).  General appearance: alert and no distress Neck: no adenopathy, no carotid bruit, no JVD, supple, symmetrical, trachea midline and thyroid not enlarged, symmetric, no tenderness/mass/nodules Lungs: clear to auscultation bilaterally Heart: regular rate and rhythm, S1, S2 normal, no murmur, click, rub or gallop Extremities: extremities normal, atraumatic, no cyanosis or edema and 2+ pedal pulses bilaterally  EKG sinus tachycardia 104 without ST or T wave changes  ASSESSMENT AND PLAN:   HYPERTENSION Controlled on current medications  Tachycardia Patient has a history of palpitations. She has normal LV function by 2-D echocardiogram last year. Her thyroid functions are normal. A 48 hour Holter monitor showed sinus rhythm with PVCs and short runs of PSVT. She is already on Cardizem CD 240 mg daily. I'm going to add Toprol-XL 25 mg daily and we'll see her back in 3 months      Kristy Harp MD [facility], [facility] 01/07/2014 12:14 PM

## 2014-01-07 NOTE — Assessment & Plan Note (Signed)
Controlled on current medications 

## 2014-01-07 NOTE — Patient Instructions (Signed)
Your physician wants you to follow-up in: 3 months with Dr Gwenlyn Found. You will receive a reminder letter in the mail two months in advance. If you don't receive a letter, please call our office to schedule the follow-up appointment.  Start Toprol XL 25mg  daily

## 2014-01-07 NOTE — Assessment & Plan Note (Signed)
Patient has a history of palpitations. She has normal LV function by 2-D echocardiogram last year. Her thyroid functions are normal. A 48 hour Holter monitor showed sinus rhythm with PVCs and short runs of PSVT. She is already on Cardizem CD 240 mg daily. I'm going to add Toprol-XL 25 mg daily and we'll see her back in 3 months

## 2014-01-09 DIAGNOSIS — J309 Allergic rhinitis, unspecified: Secondary | ICD-10-CM | POA: Diagnosis not present

## 2014-01-10 ENCOUNTER — Telehealth: Payer: Self-pay | Admitting: *Deleted

## 2014-01-10 ENCOUNTER — Encounter: Payer: Self-pay | Admitting: Gynecology

## 2014-01-10 ENCOUNTER — Ambulatory Visit (INDEPENDENT_AMBULATORY_CARE_PROVIDER_SITE_OTHER): Payer: Medicare Other | Admitting: Gynecology

## 2014-01-10 VITALS — BP 140/82 | Ht 64.25 in | Wt 205.0 lb

## 2014-01-10 DIAGNOSIS — M899 Disorder of bone, unspecified: Secondary | ICD-10-CM

## 2014-01-10 DIAGNOSIS — Z79890 Hormone replacement therapy (postmenopausal): Secondary | ICD-10-CM

## 2014-01-10 DIAGNOSIS — L659 Nonscarring hair loss, unspecified: Secondary | ICD-10-CM | POA: Diagnosis not present

## 2014-01-10 DIAGNOSIS — Z803 Family history of malignant neoplasm of breast: Secondary | ICD-10-CM

## 2014-01-10 DIAGNOSIS — J309 Allergic rhinitis, unspecified: Secondary | ICD-10-CM | POA: Diagnosis not present

## 2014-01-10 DIAGNOSIS — N952 Postmenopausal atrophic vaginitis: Secondary | ICD-10-CM

## 2014-01-10 DIAGNOSIS — M949 Disorder of cartilage, unspecified: Secondary | ICD-10-CM | POA: Diagnosis not present

## 2014-01-10 DIAGNOSIS — M8580 Other specified disorders of bone density and structure, unspecified site: Secondary | ICD-10-CM

## 2014-01-10 MED ORDER — NONFORMULARY OR COMPOUNDED ITEM: Status: DC

## 2014-01-10 NOTE — Patient Instructions (Signed)
BRCA-1 and BRCA-2 BRCA-1 and BRCA-2 are 2 genes that are linked with hereditary breast and ovarian cancers. About 200,000 women are diagnosed with invasive breast cancer each year and about 23,000 with ovarian cancer (according to the American Cancer Society). Of these cancers, about 5% to 10% will be due to a mutation in one of the BRCA genes. Men can also inherit an increased risk of developing breast cancer, primarily from an alteration in the BRCA-2 gene.  Individuals with mutations in BRCA1 or BRCA2 have significantly elevated risks for breast cancer (up to 80% lifetime risk), ovarian cancer (up to 40% lifetime risk), bilateral breast cancer and other types of cancers. BRCA mutations are inherited and passed from generation to generation. One half of the time, they are passed from the father's side of the family.  The DNA in white blood cells is used to detect mutations in the BRCA genes. While the gene products (proteins) of the BRCA genes act only in breast and ovarian tissue, the genes are present in every cell of the body and blood is the most easily accessible source of that DNA. PREPARATION FOR TEST The test for BRCA mutations is done on a blood sample collected by needle from a vein in the arm. The test does not require surgical biopsy of breast or ovarian tissue.  NORMAL FINDINGS No genetic mutations. Ranges for normal findings may vary among different laboratories and hospitals. You should always check with your doctor after having lab work or other tests done to discuss the meaning of your test results and whether your values are considered within normal limits. MEANING OF TEST  Your caregiver will go over the test results with you and discuss the importance and meaning of your results, as well as treatment options and the need for additional tests if necessary. OBTAINING THE TEST RESULTS It is your responsibility to obtain your test results. Ask the lab or department performing the test  when and how you will get your results. OTHER THINGS TO KNOW Your test results may have implications for other family members. When one member of a family is tested for BRCA mutations, issues often arise about how or whether to share this information with other family members. Seek advice from a genetic counselor about communication of result with your family members.  Pre and post test consultation with a health care provider knowledgeable about genetic testing cannot be overemphasized.  There are many issues to be considered when preparing for a genetic test and upon learning the results, and a genetic counselor has the knowledge and experience to help you sort through them.  If the BRCA test is positive, the options include increased frequency of check-ups (e.g., mammography, blood tests for CA-125, or transvaginal ultrasonography); medications that could reduce risk (e.g., oral contraceptives or tamoxifen); or surgical removal of the ovaries or breasts. There are a number of variables involved and it is important to discuss your options with your doctor and genetic counselor. Research studies have reported that for every 1000 women negative for BRCA mutations, between 12 and 45 of them will develop breast cancer by age 50 and between 3 and 4 will develop ovarian cancer by age 50. The risk increases with age. The test can be ordered by a doctor, preferably by one who can also offer genetic counseling. The blood sample will be sent to a laboratory that specializes in BRCA testing. The American Society of Clinical Oncology and the National Breast Cancer Coalition encourage women seeking the   test to participate in long-term outcome studies to help gather information on the effectiveness of different check-up and treatment options. Document Released: 06/10/2004 Document Revised: 08/09/2011 Document Reviewed: 08/17/2013 [facility] Patient Information 2015 [place], [place]. This information is not intended to  replace advice given to you by your health care provider. Make sure you discuss any questions you have with your health care provider. Shingles Vaccine What You Need to Know WHAT IS SHINGLES?  Shingles is a painful skin rash, often with blisters. It is also called Herpes Zoster or just Zoster.  A shingles rash usually appears on one side of the face or body and lasts from 2 to 4 weeks. Its main symptom is pain, which can be quite severe. Other symptoms of shingles can include fever, headache, chills, and upset stomach. Very rarely, a shingles infection can lead to pneumonia, hearing problems, blindness, brain inflammation (encephalitis), or death.  For about 1 person in 5, severe pain can continue even after the rash clears up. This is called post-herpetic neuralgia.  Shingles is caused by the Varicella Zoster virus. This is the same virus that causes chickenpox. Only someone who has had a case of chickenpox or rarely, has gotten chickenpox vaccine, can get shingles. The virus stays in your body. It can reappear many years later to cause a case of shingles.  You cannot catch shingles from another person with shingles. However, a person who has never had chickenpox (or chickenpox vaccine) could get chickenpox from someone with shingles. This is not very common.  Shingles is far more common in people 69 and older than in younger people. It is also more common in people whose immune systems are weakened because of a disease such as cancer or drugs such as steroids or chemotherapy.  At least 1 million people get shingles per year in the [place]. SHINGLES VACCINE  A vaccine for shingles was licensed in 3559. In clinical trials, the vaccine reduced the risk of shingles by 50%. It can also reduce the pain in people who still get shingles after being vaccinated.  A single dose of shingles vaccine is recommended for adults 69 years of age and older. SOME PEOPLE SHOULD NOT GET SHINGLES VACCINE OR  SHOULD WAIT A person should not get shingles vaccine if he or she:  Has ever had a life-threatening allergic reaction to gelatin, the antibiotic neomycin, or any other component of shingles vaccine. Tell your caregiver if you have any severe allergies.  Has a weakened immune system because of current:  AIDS or another disease that affects the immune system.  Treatment with drugs that affect the immune system, such as prolonged use of high-dose steroids.  Cancer treatment, such as radiation or chemotherapy.  Cancer affecting the bone marrow or lymphatic system, such as leukemia or lymphoma.  Is pregnant, or might be pregnant. Women should not become pregnant until at least 4 weeks after getting shingles vaccine. Someone with a minor illness, such as a cold, may be vaccinated. Anyone with a moderate or severe acute illness should usually wait until he or she recovers before getting the vaccine. This includes anyone with a temperature of 101.3 F (38 C) or higher. WHAT ARE THE RISKS FROM SHINGLES VACCINE?  A vaccine, like any medicine, could possibly cause serious problems, such as severe allergic reactions. However, the risk of a vaccine causing serious harm, or death, is extremely small.  No serious problems have been identified with shingles vaccine. Mild Problems  Redness, soreness, swelling, or  itching at the site of the injection (about 1 person in 3).  Headache (about 1 person in 28). Like all vaccines, shingles vaccine is being closely monitored for unusual or severe problems. WHAT IF THERE IS A MODERATE OR SEVERE REACTION? What should I look for? Any unusual condition, such as a severe allergic reaction or a high fever. If a severe allergic reaction occurred, it would be within a few minutes to an hour after the shot. Signs of a serious allergic reaction can include difficulty breathing, weakness, hoarseness or wheezing, a fast heartbeat, hives, dizziness, paleness, or swelling  of the throat. What should I do?  Call your caregiver, or get the person to a caregiver right away.  Tell the caregiver what happened, the date and time it happened, and when the vaccination was given.  Ask the caregiver to report the reaction by filing a Vaccine Adverse Event Reporting System (VAERS) form. Or, you can file this report through the VAERS web site at www.vaers.SamedayNews.es or by calling 303-666-3354. VAERS does not provide medical advice. HOW CAN I LEARN MORE?  Ask your caregiver. He or she can give you the vaccine package insert or suggest other sources of information.  Contact the Centers for Disease Control and Prevention (CDC):  Call 571-429-9203 (1-800-CDC-INFO).  Visit the CDC website at http://hunter.com/ CDC Shingles Vaccine VIS (03/05/08) Document Released: 03/14/2006 Document Revised: 08/09/2011 Document Reviewed: 09/06/2012 [facility] Patient Information 2015 [place]. This information is not intended to replace advice given to you by your health care provider. Make sure you discuss any questions you have with your health care provider.

## 2014-01-10 NOTE — Telephone Encounter (Signed)
Message copied by Thamas Jaegers on Thu Jan 10, 2014  2:42 PM ------      Message from: Terrance Mass      Created: Thu Jan 10, 2014 12:10 PM       Please schedule consultation with Geneticist at the [facility] for this patient with strong family history of breast cancer who will need BrCA testing. Thanks ------

## 2014-01-10 NOTE — Progress Notes (Signed)
Kristy [name] 1946/05/26 [id number]   History:    68 y.o.  for GYN followup. Patient has been complaining of hair loss. She is seeing a dermatologist. She is in the process of seeing a rheumatologist. Her PCP recently tested her TSH which was in the normal range. Her new PCP now is going to be Dr. Larose Kells which she will see on September 22 and will be doing all her blood work.Patient with past history of atrophic vaginitis who had been on Premarin vaginal cream twice a week currently not taking. Patient with prior history of total abdominal hysterectomy with bilateral salpingo-oophorectomy as well as bladder neck suspension. She still occasionally will complaining of stress urinary incontinence.   The patient was strong family history of breast cancer were by her sister and cousin both were BRCA1 and BRCA2 positive for the gene mutation. Her mother had breast cancer at the age of 35. Patient has not obtained her BRCA1 and BRCA2 gene mutation testing. Patient tells me that the sister with the BRCA1 and BRCA2 gene mutation has 2 daughters who were tested and were negative for the gene. Patient also states that her other sister with no history of breast cancer was tested and was BRCA1 and BRCA2 gene mutation negative.Patient does her monthly breast exam. Patient prior to her hysterectomy had no history of abnormal Pap smear.   The patient's last bone density study in 2014 had demonstrated that her lowest T. score was -1.2 with a left femoral neck and within normal FRAX analysis. Last colonoscopy report to be normal 8 years ago. Patient has received her Tdap vaccine but has not received shingles vaccine as of yet.    Past medical history,surgical history, family history and social history were all reviewed and documented in the EPIC chart.  Gynecologic History No LMP recorded. Patient has had a hysterectomy. Contraception: status post hysterectomy Last Pap: 2010. Results were: normal Last mammogram:  2014. Results were: Normal but dense 3D  Obstetric History OB History  Gravida Para Term Preterm AB SAB TAB Ectopic Multiple Living  _0 # Outcome Date GA Lbr Len/2nd Weight Sex Delivery Anes PTL Lv  2 PAR           1 PAR                ROS: A ROS was performed and pertinent positives and negatives are included in the history.  GENERAL: No fevers or chills. HEENT: No change in vision, no earache, sore throat or sinus congestion. NECK: No pain or stiffness. CARDIOVASCULAR: No chest pain or pressure. No palpitations. PULMONARY: No shortness of breath, cough or wheeze. GASTROINTESTINAL: No abdominal pain, nausea, vomiting or diarrhea, melena or bright red blood per rectum. GENITOURINARY: No urinary frequency, urgency, hesitancy or dysuria. MUSCULOSKELETAL: No joint or muscle pain, no back pain, no recent trauma. DERMATOLOGIC: No rash, no itching, no lesions. ENDOCRINE: No polyuria, polydipsia, no heat or cold intolerance. No recent change in weight. HEMATOLOGICAL: No anemia or easy bruising or bleeding. NEUROLOGIC: No headache, seizures, numbness, tingling or weakness. PSYCHIATRIC: No depression, no loss of interest in normal activity or change in sleep pattern.     Exam: chaperone present  BP 140/82  Ht 5' 4.25" (1.632 m)  Wt 205 lb (92.987 kg)  BMI 34.91 kg/m2  Body mass index is 34.91 kg/(m^2).  General appearance : Well developed well nourished female. No acute distress HEENT: Neck  supple, trachea midline, no carotid bruits, no thyroidmegaly Lungs: Clear to auscultation, no rhonchi or wheezes, or rib retractions  Heart: Regular rate and rhythm, no murmurs or gallops Breast:Examined in sitting and supine position were symmetrical in appearance, no palpable masses or tenderness,  no skin retraction, no nipple inversion, no nipple discharge, no skin discoloration, no axillary or supraclavicular lymphadenopathy Abdomen: no palpable masses or tenderness, no rebound or  guarding Extremities: no edema or skin discoloration or tenderness  Pelvic:  Bartholin, Urethra, Skene Glands: Within normal limits             Vagina: No gross lesions or discharge  Cervix: Absent  Uterus  Absent  Adnexa  Without masses or tenderness  Anus and perineum  normal   Rectovaginal  normal sphincter tone without palpated masses or tenderness             Hemoccult PCP will provide     Assessment/Plan:  68 y.o. female for her GYN exam doing well with vaginal estrogen application twice a week for vaginal atrophy. Patient with history of osteopenia will continue her calcium and vitamin D and regular exercise. Her next bone density is in 2016. She was reminded to schedule her mammogram and to request a 3-D due to family history of breast cancer as well as her dense breasts. The patient will be referred to the geneticist at the [facility] for BRCA1 and BRCA2 gene mutation testing. A requisition for patient to receive her shingles vaccine was provided. Patient's PCP will be doing her blood work. Patient seen by dermatologist recently and was diagnosed with senile keratosis of the scalp. Pap smear not done today.  Note: This dictation was prepared with  Dragon/digital dictation along withSmart phrase technology. Any transcriptional errors that result from this process are unintentional.   Terrance Mass MD, 12:24 PM 01/10/2014

## 2014-01-10 NOTE — Telephone Encounter (Signed)
Referral faxed to [facility] they will contact pt to schedule.  

## 2014-01-15 ENCOUNTER — Ambulatory Visit: Payer: Medicare Other | Admitting: Internal Medicine

## 2014-01-16 ENCOUNTER — Telehealth: Payer: Self-pay | Admitting: *Deleted

## 2014-01-16 NOTE — Telephone Encounter (Signed)
Left message for pt to return my call so I can schedule a genetic appt w/ her.  

## 2014-01-17 ENCOUNTER — Telehealth: Payer: Self-pay | Admitting: *Deleted

## 2014-01-17 ENCOUNTER — Telehealth: Payer: Self-pay

## 2014-01-17 NOTE — Telephone Encounter (Signed)
Pt returned my call and I confirmed 01/18/14 genetic appt. Called referring to make them aware.

## 2014-01-17 NOTE — Telephone Encounter (Signed)
Kristy [name] called to let Kristy [name] know that she has confirmed with patient a genetics appointment for 01/18/14 at 12:00 pm.

## 2014-01-17 NOTE — Telephone Encounter (Signed)
[facility]y [name] with [facility] called to confirm w that she spoke with patient and she will have appt 01/18/14 12:00pm.

## 2014-01-18 ENCOUNTER — Encounter: Payer: Self-pay | Admitting: Genetic Counselor

## 2014-01-18 ENCOUNTER — Ambulatory Visit (HOSPITAL_BASED_OUTPATIENT_CLINIC_OR_DEPARTMENT_OTHER): Payer: Medicare Other | Admitting: Genetic Counselor

## 2014-01-18 ENCOUNTER — Other Ambulatory Visit: Payer: Medicare Other

## 2014-01-18 DIAGNOSIS — J309 Allergic rhinitis, unspecified: Secondary | ICD-10-CM | POA: Diagnosis not present

## 2014-01-18 DIAGNOSIS — Z803 Family history of malignant neoplasm of breast: Secondary | ICD-10-CM | POA: Insufficient documentation

## 2014-01-18 DIAGNOSIS — IMO0002 Reserved for concepts with insufficient information to code with codable children: Secondary | ICD-10-CM | POA: Diagnosis not present

## 2014-01-18 DIAGNOSIS — Z8481 Family history of carrier of genetic disease: Secondary | ICD-10-CM | POA: Insufficient documentation

## 2014-01-18 NOTE — Progress Notes (Signed)
Patient Name: Kristy [name] Patient Age: 68 y.o. Encounter Date: 01/18/2014  Referring Physician: Uvaldo Rising, MD  Primary Care Provider: Kathlene November, MD   Kristy [name], a 68 y.o. female, is being seen at the [facility] due to a family history of breast cancer and a pathogenic BRCA2 mutation in her family. She presents to clinic today to discuss genetic testing for the familial mutation.  HISTORY OF PRESENT ILLNESS: Kristy [name] has no personal history of cancer.  She had a TAH/BSO around age 33 due to excessive bleeding. She reported that she has a yearly gynecologic exam, clinical breast exam and mammogram. She stated she had a colonoscopy in 2012 and is due for another 5 years from that. She stated she has had up to 5 total polyps removed.  She brought documentation of her sister's genetic testing indicating she has a pathogenic BRCA2 mutation called [id number] (old nomenclature). Analysis was done at [facility] in 2012.   Past Medical History  Diagnosis Date  . Asthma   . Hypertension   . Hiatal hernia   . SUI (stress urinary incontinence, female)     (resolved after surgery)  . Atrophic vaginitis   . Fibromyalgia     Dr Amil Amen  . Shortness of breath     occ  . GERD (gastroesophageal reflux disease)   . DJD (degenerative joint disease)     generalized joint pain, including back  . TIA (transient ischemic attack)     07/2012, see Dr Leonie Man  . History of kidney stones   . Vertigo   . Gout   . Palpitations     PSVT on Holter monitoring  . FHx: BRCA2 gene positive     BRCA2 mutation in sister  . Family history of malignant neoplasm of breast     Past Surgical History  Procedure Laterality Date  . Abdominal hysterectomy  1994    TAH,BSO-BLADDER NECK SUSPENSION  . Surgery for poserior tibial tendon  2003  . Cystocele repair  1994  . Cholecystectomy, laparoscopic  2001  . Tubal ligation    . Carpal tunnel release Right 2005  . Triple fusion left ankle Left  2007  . Oophorectomy      BSO  . Breast surgery      LUMPECTOMY-BENIGN Breast Bx X2  . Tonsillectomy    . Knee arthroscopy      bil  . Knee arthroplasty  05/19/2012    Procedure: COMPUTER ASSISTED TOTAL KNEE ARTHROPLASTY;  Surgeon: Marybelle Killings, MD;  Location: [place];  Service: Orthopedics;  Laterality: Left;  Left  Total Knee Arthroplasty  . Knee arthroplasty Right 09/03/2013    Procedure: COMPUTER ASSISTED TOTAL KNEE ARTHROPLASTY;  Surgeon: Marybelle Killings, MD;  Location: [place];  Service: Orthopedics;  Laterality: Right;  Right Total Knee Arthroplasty, Computer Assist    History   Social History  . Marital Status: Married    Spouse Name: [name]    Number of Children: 1  . Years of Education: 16   Occupational History  . 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 Smoking status: Never Smoker   . Smokeless tobacco: Never Used  . Alcohol Use: Yes     Comment: rare  . Drug Use: No  . Sexual Activity: No   Other Topics Concern  . 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 married '69. 2 sons - '70, '  26: retired - [employer]. SO- good health.   ACP - OK for CPR at least once, no long term ventilation, no heroic measures in the face of poor quality of life. HCPOA - husband, secondary son Madlyn Crosby (c) [phone]         FAMILY HISTORY:   During the visit, a 4-generation pedigree was obtained. Significant diagnoses include the following:  Family History  Problem Relation Age of Onset  . Breast cancer Mother 73    deceased 71  . Breast cancer Sister 26    bilateral breast ca @ 60; BRCA2 positive  . Hypertension Sister   . Cancer Father     Brain tumor; path?; deceased 1  . Lymphoma Maternal Aunt     deceased 82s  . Hemochromatosis Sister   . Thalassemia Sister   . Other Sister     negative for BRCA1 BRCA2  . Breast cancer Cousin     BRCA2 positive  . Diabetes Other   . Breast cancer Other     distant maternal female  relatives with breast cancer    Additionally, Kristy [name] has two sons (age 30 and 17). Her father had a sister and 3 brothers who did not have any history of cancer.  Kristy [name] ancestry is Caucasian. There is no known Jewish ancestry and no consanguinity.  ASSESSMENT AND PLAN: [name] is a 68 y.o. female with a family history of breast cancer and a pathogenic BRCA2 mutation in her sister. We reviewed the characteristics, features and inheritance patterns of BRCA2 and reviewed the NCCN guidelines for screenings and management in those with a mutation. We also discussed genetic testing, including the other appropriate family members to test, the process of testing, insurance coverage and implications of results. She understands that she has a 50% chance of having inherited this mutation.  Kristy [name] wished to pursue genetic testing and a blood sample will be sent to [facility] for analysis of the specific BRCA2 mutation found in her sister. Results should be available in approximately 2-3 weeks, at which point we will contact her and address implications for her as well as address genetic testing for at-risk family members, if needed.    We encouraged Kristy [name] to remain in contact with Cancer Genetics annually so that we can update the family history and inform her of any changes in cancer genetics and testing that may be of benefit for this family. Ms.  [name] questions were answered to her satisfaction today.   Thank you for the referral and allowing [place] to share in the care of your patient.   The patient was seen for a total of 30 minutes, greater than 50% of which was spent face-to-face counseling. This patient was discussed with the overseeing provider who agrees with the above.   Steele Berg, MS, [place] Certified Genetic Counseor phone: [phone] [email or web address]

## 2014-01-31 DIAGNOSIS — J309 Allergic rhinitis, unspecified: Secondary | ICD-10-CM | POA: Diagnosis not present

## 2014-02-05 ENCOUNTER — Other Ambulatory Visit: Payer: Self-pay | Admitting: Internal Medicine

## 2014-02-06 ENCOUNTER — Other Ambulatory Visit: Payer: Self-pay

## 2014-02-06 MED ORDER — OMEPRAZOLE 40 MG PO CPDR: 40.0000 mg | DELAYED_RELEASE_CAPSULE | Freq: Every day | ORAL | Status: DC

## 2014-02-07 DIAGNOSIS — J309 Allergic rhinitis, unspecified: Secondary | ICD-10-CM | POA: Diagnosis not present

## 2014-02-11 DIAGNOSIS — M159 Polyosteoarthritis, unspecified: Secondary | ICD-10-CM | POA: Diagnosis not present

## 2014-02-11 DIAGNOSIS — M1A00X0 Idiopathic chronic gout, unspecified site, without tophus (tophi): Secondary | ICD-10-CM | POA: Diagnosis not present

## 2014-02-11 DIAGNOSIS — IMO0001 Reserved for inherently not codable concepts without codable children: Secondary | ICD-10-CM | POA: Diagnosis not present

## 2014-02-12 ENCOUNTER — Encounter: Payer: Self-pay | Admitting: Gastroenterology

## 2014-02-12 ENCOUNTER — Encounter: Payer: Self-pay | Admitting: Genetic Counselor

## 2014-02-12 ENCOUNTER — Encounter: Payer: Self-pay | Admitting: Internal Medicine

## 2014-02-12 NOTE — Progress Notes (Signed)
GENETIC TEST RESULTS  Referring Physician: Uvaldo Rising, MD   Ms. Glassburn was called today to discuss genetic test results. Please see the Genetics note from her visit on 01/18/14 for a detailed discussion of her personal and family history.  GENETIC TESTING: We recommended Ms. Hensch pursue testing for the familial gene mutation called BRCA2, [id number] (also known as 2041insA) . Ms. Staffa's test, which was performed at [facility], did not reveal this mutation previously identified in her family. We call this result a true negative result because the cancer-causing mutation was identified in Ms. Closs's family, and she did not inherit it. Given this negative result, Ms. Smither chances of developing BRCA2related cancers are the same as they are in the general population.    CANCER SCREENING: This normal result is reassuring and indicates that Ms. Goeden does not have an increased risk of cancer due to a BRCA2 mutation. We recommended Ms. Felling continue to follow the cancer screening guidelines provided by her primary physician.   FAMILY MEMBERS: Even though Ms. Martinez did not inherit the familial gene mutation, others in the family may have. It is very important all of Ms. Dory relatives (female and female) know of the presence of this mutation and that they may be at increased risk for cancer. Genetic testing can sort out who in your family is at risk and who is not.  Please let us know if we can help facilitate testing. Genetic counselors can be located in other cities, by visiting the website of the [employer] (ArtistMovie.se) and Field seismologist for a Dietitian by zip code.  Lastly, we discussed with Ms. Kitts that cancer genetics is a rapidly advancing field and it is possible that new genetic tests will be appropriate for her in the future. We encouraged her to remain in contact with [place] on an annual basis so we can update her personal and family histories, and let her  know of advances in cancer genetics that may benefit the family. Our contact number was provided. Ms. Millspaugh questions were answered to her satisfaction today, and she knows she is welcome to call anytime with additional questions.    Steele Berg, MS, [place] Certified Genetic Counseor phone: [phone] [email or web address]

## 2014-02-19 ENCOUNTER — Encounter: Payer: Self-pay | Admitting: Internal Medicine

## 2014-02-19 ENCOUNTER — Ambulatory Visit (INDEPENDENT_AMBULATORY_CARE_PROVIDER_SITE_OTHER): Payer: Medicare Other | Admitting: Internal Medicine

## 2014-02-19 VITALS — BP 130/82 | HR 98 | Temp 97.6°F | Ht 65.0 in | Wt 204.2 lb

## 2014-02-19 DIAGNOSIS — H8110 Benign paroxysmal vertigo, unspecified ear: Secondary | ICD-10-CM | POA: Diagnosis not present

## 2014-02-19 DIAGNOSIS — Z23 Encounter for immunization: Secondary | ICD-10-CM | POA: Diagnosis not present

## 2014-02-19 DIAGNOSIS — I10 Essential (primary) hypertension: Secondary | ICD-10-CM | POA: Diagnosis not present

## 2014-02-19 DIAGNOSIS — R000 Tachycardia, unspecified: Secondary | ICD-10-CM | POA: Diagnosis not present

## 2014-02-19 LAB — BASIC METABOLIC PANEL
BUN: 16 mg/dL (ref 6–23)
CHLORIDE: 103 meq/L (ref 96–112)
CO2: 25 mEq/L (ref 19–32)
Calcium: 9.7 mg/dL (ref 8.4–10.5)
Creatinine, Ser: 0.7 mg/dL (ref 0.4–1.2)
GFR: 88.12 mL/min (ref >60.00)
Glucose, Bld: 119 mg/dL — ABNORMAL HIGH (ref 70–99)
Potassium: 3.7 mEq/L (ref 3.5–5.1)
SODIUM: 137 meq/L (ref 135–145)

## 2014-02-19 NOTE — Assessment & Plan Note (Signed)
Status post cardiology eval, found to have short runs of PSVT they added Toprol. Hear rate today 98.

## 2014-02-19 NOTE — Progress Notes (Signed)
Pre visit review using our clinic review tool, if applicable. No additional management support is needed unless otherwise documented below in the visit note. 

## 2014-02-19 NOTE — Progress Notes (Signed)
Subjective:    Patient ID: Kristy [name], female    DOB: [birth year], 68 y.o.   MRN: [id number]  DOS:  02/19/2014 Type of visit - description : f/u  Interval history: Tachycardic, saw cardiology, note reviewed, see assessment and plan Diabetes, on diet control, last A1c satisfactory Her main concern today is vertigo,   this is going on for at least 3 years, is described as spinning, often times triggered by changing head position or turning in bed, he may last a few minutes, no associated nausea.Has fallen a couple of times due to to dizziness. Med list reviewed good compliance. Labs reviewed, due for a BMP   ROS  c/o R ear pressure but denies fever or chills. She has on and off runny nose or sore throat thinks related to allergies. Denies any sore speech, diplopia or  motor deficits  Past Medical History  Diagnosis Date  . Asthma   . Hypertension   . Hiatal hernia   . SUI (stress urinary incontinence, female)     (resolved after surgery)  . Atrophic vaginitis   . Fibromyalgia     Dr Amil Amen  . Shortness of breath     occ  . GERD (gastroesophageal reflux disease)   . DJD (degenerative joint disease)     generalized joint pain, including back  . TIA (transient ischemic attack)     07/2012, see Dr Leonie Man  . History of kidney stones   . Vertigo   . Gout   . Palpitations     PSVT on Holter monitoring  . FHx: BRCA2 gene positive     BRCA2 mutation in sister  . Family history of malignant neoplasm of breast     Past Surgical History  Procedure Laterality Date  . Abdominal hysterectomy  1994    TAH,BSO-BLADDER NECK SUSPENSION  . Surgery for poserior tibial tendon  2003  . Cystocele repair  1994  . Cholecystectomy, laparoscopic  2001  . Tubal ligation    . Carpal tunnel release Right 2005  . Triple fusion left ankle Left 2007  . Oophorectomy      BSO  . Breast surgery      LUMPECTOMY-BENIGN Breast Bx X2  . Tonsillectomy    . Knee arthroscopy      bil  . Knee  arthroplasty  05/19/2012    Procedure: COMPUTER ASSISTED TOTAL KNEE ARTHROPLASTY;  Surgeon: Marybelle Killings, MD;  Location: [facility];  Service: Orthopedics;  Laterality: Left;  Left  Total Knee Arthroplasty  . Knee arthroplasty Right 09/03/2013    Procedure: COMPUTER ASSISTED TOTAL KNEE ARTHROPLASTY;  Surgeon: Marybelle Killings, MD;  Location: [facility];  Service: Orthopedics;  Laterality: Right;  Right Total Knee Arthroplasty, Computer Assist    History   Social History  . Marital Status: Married    Spouse Name: Kristy [name]    Number of Children: 1  . Years of Education: 16   Occupational History  . 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 Smoking status: Never Smoker   . Smokeless tobacco: Never Used  . Alcohol Use: Yes     Comment: rare  . Drug Use: No  . Sexual Activity: No   Other Topics Concern  . 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 married '69. 2 sons - '70, '72: retired - [employer]. SO- good health.   ACP - OK for CPR at least once, no  long term ventilation, no heroic measures in the face of poor quality of life. HCPOA - husband, secondary son Kristy [name] (c) [phone]            Medication List       This list is accurate as of: 02/19/14  9:35 PM.  Always use your most recent med list.               aspirin 325 MG tablet  Take 1 tablet (325 mg total) by mouth daily.     ASTEPRO 0.15 % Soln  Generic drug:  Azelastine HCl  Place 2 sprays into the nose daily.     beclomethasone 80 MCG/ACT inhaler  Commonly known as:  QVAR  Inhale 1 puff into the lungs 2 (two) times daily.     diltiazem 240 MG 24 hr capsule  Commonly known as:  CARDIZEM CD  TAKE 1 CAPSULE (240 MG TOTAL) BY MOUTH DAILY.     EPIPEN 0.3 mg/0.3 mL Devi  Generic drug:  EPINEPHrine  Inject 0.3 mg into the muscle as needed (anaphylactic). For allergic reactions     furosemide 40 MG tablet  Commonly known as:  LASIX  TAKE 1 TABLET BY MOUTH EVERY DAY      HM VITAMIN D3 2000 UNITS Caps  Generic drug:  Cholecalciferol  Take 2,000 Units by mouth daily.     levalbuterol 45 MCG/ACT inhaler  Commonly known as:  XOPENEX HFA  Inhale 2 puffs into the lungs every 4 (four) hours as needed for shortness of breath.     levocetirizine 5 MG tablet  Commonly known as:  XYZAL  Take 5 mg by mouth daily.     losartan 50 MG tablet  Commonly known as:  COZAAR  TAKE 1 TABLET EVERY MORNING     metoprolol succinate 25 MG 24 hr tablet  Commonly known as:  TOPROL XL  Take 1 tablet (25 mg total) by mouth daily.     NONFORMULARY OR COMPOUNDED ITEM  - Estradiol .02%  - 1 ML Prefilled Applicator  - Sig: apply vaginally twice a week  - #90 Day Supply with 4 refills     nortriptyline 25 MG capsule  Commonly known as:  PAMELOR  Take 25 mg by mouth at bedtime.     omeprazole 40 MG capsule  Commonly known as:  PRILOSEC  Take 1 capsule (40 mg total) by mouth daily.     PATADAY 0.2 % Soln  Generic drug:  Olopatadine HCl  Place 1 drop into both eyes daily.     vitamin E 400 UNIT capsule  Take 400 Units by mouth daily.           Objective:   Physical Exam BP 130/82  Pulse 98  Temp(Src) 97.6 F (36.4 C) (Oral)  Ht '5\' 5"'  (1.651 m)  Wt 204 lb 4 oz (92.647 kg)  BMI 33.99 kg/m2  SpO2 97%  General -- alert, well-developed, NAD.    HEENT-- Not pale.  R and L  Ear-- TM slt bulge , no red or d/c Throat symmetric, no redness or discharge.  Face symmetric, sinuses not tender to palpation. Nose not congested. Lungs -- normal respiratory effort, no intercostal retractions, no accessory muscle use, and normal breath sounds.  Heart-- normal rate, regular rhythm, no murmur.   Extremities-- no pretibial edema bilaterally  Neurologic--  alert & oriented X3. Speech normal, gait appropriate for age, strength symmetric and appropriate for age.  EOMI, PERLA   Psych--  Cognition and judgment appear intact. Cooperative with normal attention span and  concentration. No anxious or depressed appearing.       Assessment & Plan:

## 2014-02-19 NOTE — Assessment & Plan Note (Signed)
BP well controlled, continue with present care, check a BMP

## 2014-02-19 NOTE — Assessment & Plan Note (Addendum)
Long history of  Vertigo on and off, likely positional vertigo. nnot associated with stroke symptoms. Plan: PT referral for vestibular rehabilitation, consisting use of a cane to prevent falls.

## 2014-02-19 NOTE — Patient Instructions (Signed)
Get your blood work before you leave    Please come back to the office by 05-2014 for a physical exam. Come back fasting

## 2014-02-25 DIAGNOSIS — J309 Allergic rhinitis, unspecified: Secondary | ICD-10-CM | POA: Diagnosis not present

## 2014-03-05 ENCOUNTER — Ambulatory Visit: Payer: Medicare Other | Attending: Internal Medicine | Admitting: Physical Therapy

## 2014-03-05 DIAGNOSIS — R42 Dizziness and giddiness: Secondary | ICD-10-CM | POA: Diagnosis not present

## 2014-03-05 DIAGNOSIS — Z5189 Encounter for other specified aftercare: Secondary | ICD-10-CM | POA: Insufficient documentation

## 2014-03-05 DIAGNOSIS — M797 Fibromyalgia: Secondary | ICD-10-CM | POA: Insufficient documentation

## 2014-03-05 DIAGNOSIS — Z96652 Presence of left artificial knee joint: Secondary | ICD-10-CM | POA: Insufficient documentation

## 2014-03-05 DIAGNOSIS — Z96651 Presence of right artificial knee joint: Secondary | ICD-10-CM | POA: Diagnosis not present

## 2014-03-07 ENCOUNTER — Ambulatory Visit: Payer: Medicare Other | Admitting: Physical Therapy

## 2014-03-07 DIAGNOSIS — J3089 Other allergic rhinitis: Secondary | ICD-10-CM | POA: Diagnosis not present

## 2014-03-07 DIAGNOSIS — Z5189 Encounter for other specified aftercare: Secondary | ICD-10-CM | POA: Diagnosis not present

## 2014-03-07 DIAGNOSIS — J301 Allergic rhinitis due to pollen: Secondary | ICD-10-CM | POA: Diagnosis not present

## 2014-03-07 DIAGNOSIS — J3081 Allergic rhinitis due to animal (cat) (dog) hair and dander: Secondary | ICD-10-CM | POA: Diagnosis not present

## 2014-03-14 ENCOUNTER — Encounter: Payer: Self-pay | Admitting: Neurology

## 2014-03-14 ENCOUNTER — Telehealth: Payer: Self-pay | Admitting: Neurology

## 2014-03-14 DIAGNOSIS — J3081 Allergic rhinitis due to animal (cat) (dog) hair and dander: Secondary | ICD-10-CM | POA: Diagnosis not present

## 2014-03-14 DIAGNOSIS — J301 Allergic rhinitis due to pollen: Secondary | ICD-10-CM | POA: Diagnosis not present

## 2014-03-14 DIAGNOSIS — J3089 Other allergic rhinitis: Secondary | ICD-10-CM | POA: Diagnosis not present

## 2014-03-14 NOTE — Telephone Encounter (Signed)
Left message for patient regarding moving appointment on 07/31/14 per DR. Sethi's schedule, printed and mailed letter with new appointment time.

## 2014-03-15 ENCOUNTER — Other Ambulatory Visit: Payer: Self-pay

## 2014-03-21 ENCOUNTER — Ambulatory Visit: Payer: Medicare Other

## 2014-03-21 DIAGNOSIS — Z5189 Encounter for other specified aftercare: Secondary | ICD-10-CM | POA: Diagnosis not present

## 2014-03-21 DIAGNOSIS — J301 Allergic rhinitis due to pollen: Secondary | ICD-10-CM | POA: Diagnosis not present

## 2014-03-21 DIAGNOSIS — J3081 Allergic rhinitis due to animal (cat) (dog) hair and dander: Secondary | ICD-10-CM | POA: Diagnosis not present

## 2014-03-21 DIAGNOSIS — J3089 Other allergic rhinitis: Secondary | ICD-10-CM | POA: Diagnosis not present

## 2014-03-28 ENCOUNTER — Ambulatory Visit: Payer: Medicare Other

## 2014-03-28 DIAGNOSIS — J301 Allergic rhinitis due to pollen: Secondary | ICD-10-CM | POA: Diagnosis not present

## 2014-03-28 DIAGNOSIS — Z5189 Encounter for other specified aftercare: Secondary | ICD-10-CM | POA: Diagnosis not present

## 2014-03-28 DIAGNOSIS — J3081 Allergic rhinitis due to animal (cat) (dog) hair and dander: Secondary | ICD-10-CM | POA: Diagnosis not present

## 2014-03-28 DIAGNOSIS — J3089 Other allergic rhinitis: Secondary | ICD-10-CM | POA: Diagnosis not present

## 2014-04-01 ENCOUNTER — Ambulatory Visit: Payer: Medicare Other | Attending: Internal Medicine

## 2014-04-01 ENCOUNTER — Encounter: Payer: Self-pay | Admitting: Internal Medicine

## 2014-04-01 ENCOUNTER — Other Ambulatory Visit: Payer: Self-pay

## 2014-04-01 DIAGNOSIS — Z96652 Presence of left artificial knee joint: Secondary | ICD-10-CM | POA: Diagnosis not present

## 2014-04-01 DIAGNOSIS — R42 Dizziness and giddiness: Secondary | ICD-10-CM | POA: Insufficient documentation

## 2014-04-01 DIAGNOSIS — Z96651 Presence of right artificial knee joint: Secondary | ICD-10-CM | POA: Diagnosis not present

## 2014-04-01 DIAGNOSIS — M797 Fibromyalgia: Secondary | ICD-10-CM | POA: Insufficient documentation

## 2014-04-01 DIAGNOSIS — Z5189 Encounter for other specified aftercare: Secondary | ICD-10-CM | POA: Insufficient documentation

## 2014-04-01 MED ORDER — FUROSEMIDE 40 MG PO TABS: ORAL_TABLET | ORAL | Status: DC

## 2014-04-01 MED ORDER — OMEPRAZOLE 40 MG PO CPDR: 40.0000 mg | DELAYED_RELEASE_CAPSULE | Freq: Every day | ORAL | Status: DC

## 2014-04-04 ENCOUNTER — Ambulatory Visit: Payer: Medicare Other

## 2014-04-04 DIAGNOSIS — J3089 Other allergic rhinitis: Secondary | ICD-10-CM | POA: Diagnosis not present

## 2014-04-04 DIAGNOSIS — J3081 Allergic rhinitis due to animal (cat) (dog) hair and dander: Secondary | ICD-10-CM | POA: Diagnosis not present

## 2014-04-04 DIAGNOSIS — Z96651 Presence of right artificial knee joint: Secondary | ICD-10-CM | POA: Diagnosis not present

## 2014-04-04 DIAGNOSIS — R42 Dizziness and giddiness: Secondary | ICD-10-CM | POA: Diagnosis not present

## 2014-04-04 DIAGNOSIS — J301 Allergic rhinitis due to pollen: Secondary | ICD-10-CM | POA: Diagnosis not present

## 2014-04-04 DIAGNOSIS — M797 Fibromyalgia: Secondary | ICD-10-CM | POA: Diagnosis not present

## 2014-04-04 DIAGNOSIS — Z5189 Encounter for other specified aftercare: Secondary | ICD-10-CM | POA: Diagnosis not present

## 2014-04-04 DIAGNOSIS — Z96652 Presence of left artificial knee joint: Secondary | ICD-10-CM | POA: Diagnosis not present

## 2014-04-15 ENCOUNTER — Ambulatory Visit: Payer: Medicare Other

## 2014-04-15 DIAGNOSIS — M797 Fibromyalgia: Secondary | ICD-10-CM | POA: Diagnosis not present

## 2014-04-15 DIAGNOSIS — Z5189 Encounter for other specified aftercare: Secondary | ICD-10-CM | POA: Diagnosis not present

## 2014-04-15 DIAGNOSIS — Z96651 Presence of right artificial knee joint: Secondary | ICD-10-CM | POA: Diagnosis not present

## 2014-04-15 DIAGNOSIS — Z96652 Presence of left artificial knee joint: Secondary | ICD-10-CM | POA: Diagnosis not present

## 2014-04-15 DIAGNOSIS — R42 Dizziness and giddiness: Secondary | ICD-10-CM | POA: Diagnosis not present

## 2014-04-16 ENCOUNTER — Ambulatory Visit (INDEPENDENT_AMBULATORY_CARE_PROVIDER_SITE_OTHER): Payer: Medicare Other | Admitting: Cardiovascular Disease

## 2014-04-16 ENCOUNTER — Encounter: Payer: Self-pay | Admitting: Cardiovascular Disease

## 2014-04-16 VITALS — BP 140/70 | HR 92 | Ht 65.0 in | Wt 212.2 lb

## 2014-04-16 DIAGNOSIS — I10 Essential (primary) hypertension: Secondary | ICD-10-CM | POA: Diagnosis not present

## 2014-04-16 DIAGNOSIS — R000 Tachycardia, unspecified: Secondary | ICD-10-CM

## 2014-04-16 NOTE — Assessment & Plan Note (Signed)
History of hypertension with blood pressure today measured at 140/70. She is on diltiazem, metoprolol and losartan. We will continue current medications at current doses

## 2014-04-16 NOTE — Progress Notes (Signed)
04/16/2014 Kristy [name]   12/07/1945  [id number]  Primary Physician Kathlene November, MD Primary Cardiologist: Lorretta Harp MD Renae Gloss   HPI:  Kristy [name] is a 68 year old moderately overweight married Caucasian female mother of 2 sons referred by Dr. Larose Kells for evaluation of palpitations and PSVT. I last saw her in the office 01/07/14. Her cardiac risk factor profile is notable for treated hypertension. She's had a TIA 07/14/12 followed by Dr. Leonie Man. She's never had a heart attack. She has normal LV function by 2-D echocardiogram last year. 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 She complained of some palpitations and a 48 hour Holter monitor showed sinus rhythm, sinus tachycardia, PACs and short runs of PSVT. I began her on low-dose oral beta blocker which resulted in marked improvement in her symptoms.   Current Outpatient Prescriptions  Medication Sig Dispense Refill  . aspirin 325 MG tablet Take 1 tablet (325 mg total) by mouth daily.    . Azelastine HCl (ASTEPRO) 0.15 % SOLN Place 2 sprays into the nose daily.    . beclomethasone (QVAR) 80 MCG/ACT inhaler Inhale 1 puff into the lungs 2 (two) times daily.    . Cholecalciferol (HM VITAMIN D3) 2000 UNITS CAPS Take 2,000 Units by mouth daily.    Marland Kitchen diltiazem (CARDIZEM CD) 240 MG 24 hr capsule TAKE 1 CAPSULE (240 MG TOTAL) BY MOUTH DAILY. 30 capsule 1  . EPINEPHrine (EPIPEN) 0.3 mg/0.3 mL DEVI Inject 0.3 mg into the muscle as needed (anaphylactic). For allergic reactions    . furosemide (LASIX) 40 MG tablet TAKE 1 TABLET BY MOUTH EVERY DAY 30 tablet 3  . levalbuterol (XOPENEX HFA) 45 MCG/ACT inhaler Inhale 2 puffs into the lungs every 4 (four) hours as needed for shortness of breath.     . levocetirizine (XYZAL) 5 MG tablet Take 5 mg by mouth daily.    Marland Kitchen losartan (COZAAR) 50 MG tablet TAKE 1 TABLET EVERY MORNING 30 tablet 6  . metoprolol succinate (TOPROL XL) 25 MG 24 hr tablet Take 1 tablet (25 mg total)  by mouth daily. 30 tablet 6  . NONFORMULARY OR COMPOUNDED ITEM Estradiol .02% 1 ML Prefilled Applicator Sig: apply vaginally twice a week #90 Day Supply with 4 refills 1 each 4  . nortriptyline (PAMELOR) 25 MG capsule Take 25 mg by mouth at bedtime.      . Olopatadine HCl (PATADAY) 0.2 % SOLN Place 1 drop into both eyes daily.    Marland Kitchen omeprazole (PRILOSEC) 40 MG capsule Take 1 capsule (40 mg total) by mouth daily. 30 capsule 2  . vitamin E 400 UNIT capsule Take 400 Units by mouth daily.     No current facility-administered medications for this visit.    Allergies  Allergen Reactions  . Iodine Anaphylaxis  . Shellfish Allergy Anaphylaxis  . Sulfonamide Derivatives Rash  . Other Swelling    Citrus  Sometimes Lactose intolerant  . Amoxicillin Diarrhea    History   Social History  . Marital Status: Married    Spouse Name: [name]    Number of Children: 1  . Years of Education: 16   Occupational History  . 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 Smoking status: Never Smoker   . Smokeless tobacco: Never Used  . Alcohol Use: Yes     Comment: rare  . Drug Use: No  . Sexual Activity: No   Other Topics Concern  .  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 married '69. 2 sons - '70, '72: retired - [employer]. SO- good health.   ACP - OK for CPR at least once, no long term ventilation, no heroic measures in the face of poor quality of life. HCPOA - husband, secondary son Emelia Sandoval (c) [phone]         Review of Systems: General: negative for chills, fever, night sweats or weight changes.  Cardiovascular: negative for chest pain, dyspnea on exertion, edema, orthopnea, palpitations, paroxysmal nocturnal dyspnea or shortness of breath Dermatological: negative for rash Respiratory: negative for cough or wheezing Urologic: negative for hematuria Abdominal: negative for nausea, vomiting, diarrhea, bright red blood per rectum,  melena, or hematemesis Neurologic: negative for visual changes, syncope, or dizziness All other systems reviewed and are otherwise negative except as noted above.    Blood pressure 140/70, pulse 92, height 5\' 5"  (1.651 m), weight 212 lb 3.2 oz (96.253 kg).  General appearance: alert and no distress Neck: no adenopathy, no carotid bruit, no JVD, supple, symmetrical, trachea midline and thyroid not enlarged, symmetric, no tenderness/mass/nodules Lungs: clear to auscultation bilaterally Heart: regular rate and rhythm, S1, S2 normal, no murmur, click, rub or gallop Extremities: extremities normal, atraumatic, no cyanosis or edema  EKG not performed today  ASSESSMENT AND PLAN:   Essential hypertension History of hypertension with blood pressure today measured at 140/70. She is on diltiazem, metoprolol and losartan. We will continue current medications at current doses  Tachycardia History of PSVT and palpitations. I began her on low-dose oral beta locker when I saw her last 01/07/14 which resulted in marked improvement in her symptoms. We will continue current medications      Lorretta Harp MD [facility], [facility] 04/16/2014 10:18 AM

## 2014-04-16 NOTE — Patient Instructions (Signed)
Your physician wants you to follow-up in: 1 year with Dr Berry. You will receive a reminder letter in the mail two months in advance. If you don't receive a letter, please call our office to schedule the follow-up appointment.  

## 2014-04-16 NOTE — Assessment & Plan Note (Signed)
History of PSVT and palpitations. I began her on low-dose oral beta locker when I saw her last 01/07/14 which resulted in marked improvement in her symptoms. We will continue current medications

## 2014-04-18 ENCOUNTER — Ambulatory Visit: Payer: Medicare Other | Admitting: Rehabilitation

## 2014-04-18 DIAGNOSIS — J301 Allergic rhinitis due to pollen: Secondary | ICD-10-CM | POA: Diagnosis not present

## 2014-04-18 DIAGNOSIS — J3089 Other allergic rhinitis: Secondary | ICD-10-CM | POA: Diagnosis not present

## 2014-04-18 DIAGNOSIS — J3081 Allergic rhinitis due to animal (cat) (dog) hair and dander: Secondary | ICD-10-CM | POA: Diagnosis not present

## 2014-04-22 ENCOUNTER — Ambulatory Visit: Payer: Medicare Other | Admitting: Rehabilitation

## 2014-05-07 ENCOUNTER — Other Ambulatory Visit: Payer: Self-pay | Admitting: Internal Medicine

## 2014-05-08 DIAGNOSIS — J3089 Other allergic rhinitis: Secondary | ICD-10-CM | POA: Diagnosis not present

## 2014-05-08 DIAGNOSIS — J3081 Allergic rhinitis due to animal (cat) (dog) hair and dander: Secondary | ICD-10-CM | POA: Diagnosis not present

## 2014-05-08 DIAGNOSIS — J301 Allergic rhinitis due to pollen: Secondary | ICD-10-CM | POA: Diagnosis not present

## 2014-05-13 ENCOUNTER — Other Ambulatory Visit: Payer: Self-pay

## 2014-05-13 DIAGNOSIS — Z1231 Encounter for screening mammogram for malignant neoplasm of breast: Secondary | ICD-10-CM

## 2014-05-15 DIAGNOSIS — J301 Allergic rhinitis due to pollen: Secondary | ICD-10-CM | POA: Diagnosis not present

## 2014-05-15 DIAGNOSIS — J3089 Other allergic rhinitis: Secondary | ICD-10-CM | POA: Diagnosis not present

## 2014-05-15 DIAGNOSIS — J3081 Allergic rhinitis due to animal (cat) (dog) hair and dander: Secondary | ICD-10-CM | POA: Diagnosis not present

## 2014-05-31 DIAGNOSIS — H269 Unspecified cataract: Secondary | ICD-10-CM

## 2014-05-31 HISTORY — PX: CATARACT EXTRACTION, BILATERAL: SHX1313

## 2014-05-31 HISTORY — DX: Unspecified cataract: H26.9

## 2014-05-31 LAB — HM DIABETES EYE EXAM

## 2014-06-02 ENCOUNTER — Other Ambulatory Visit: Payer: Self-pay | Admitting: Internal Medicine

## 2014-06-03 ENCOUNTER — Ambulatory Visit
Admission: RE | Admit: 2014-06-03 | Discharge: 2014-06-03 | Disposition: A | Discharge status: Home / Self Care | Payer: Medicare Other | Source: Ambulatory Visit

## 2014-06-03 DIAGNOSIS — J301 Allergic rhinitis due to pollen: Secondary | ICD-10-CM | POA: Diagnosis not present

## 2014-06-03 DIAGNOSIS — J3081 Allergic rhinitis due to animal (cat) (dog) hair and dander: Secondary | ICD-10-CM | POA: Diagnosis not present

## 2014-06-03 DIAGNOSIS — Z1231 Encounter for screening mammogram for malignant neoplasm of breast: Secondary | ICD-10-CM

## 2014-06-03 DIAGNOSIS — J3089 Other allergic rhinitis: Secondary | ICD-10-CM | POA: Diagnosis not present

## 2014-06-07 ENCOUNTER — Encounter: Payer: Self-pay | Admitting: Internal Medicine

## 2014-06-07 ENCOUNTER — Ambulatory Visit (INDEPENDENT_AMBULATORY_CARE_PROVIDER_SITE_OTHER): Payer: Medicare Other | Admitting: Internal Medicine

## 2014-06-07 VITALS — BP 141/85 | HR 106 | Temp 97.5°F | Ht 65.0 in | Wt 212.0 lb

## 2014-06-07 DIAGNOSIS — I10 Essential (primary) hypertension: Secondary | ICD-10-CM | POA: Diagnosis not present

## 2014-06-07 DIAGNOSIS — Z0000 Encounter for general adult medical examination without abnormal findings: Secondary | ICD-10-CM

## 2014-06-07 DIAGNOSIS — E213 Hyperparathyroidism, unspecified: Secondary | ICD-10-CM | POA: Diagnosis not present

## 2014-06-07 DIAGNOSIS — E119 Type 2 diabetes mellitus without complications: Secondary | ICD-10-CM | POA: Diagnosis not present

## 2014-06-07 DIAGNOSIS — H8110 Benign paroxysmal vertigo, unspecified ear: Secondary | ICD-10-CM

## 2014-06-07 LAB — HEMOGLOBIN A1C: HEMOGLOBIN A1C: 7.1 % — AB (ref 4.6–6.5)

## 2014-06-07 LAB — BASIC METABOLIC PANEL
BUN: 17 mg/dL (ref 6–23)
CALCIUM: 9.7 mg/dL (ref 8.4–10.5)
CO2: 23 mEq/L (ref 19–32)
Chloride: 105 mEq/L (ref 96–112)
Creatinine, Ser: 0.8 mg/dL (ref 0.4–1.2)
GFR: 77.71 mL/min (ref >60.00)
GLUCOSE: 136 mg/dL — AB (ref 70–99)
POTASSIUM: 3.9 meq/L (ref 3.5–5.1)
Sodium: 139 mEq/L (ref 135–145)

## 2014-06-07 MED ORDER — METOPROLOL SUCCINATE ER 25 MG PO TB24: ORAL_TABLET | ORAL | Status: DC

## 2014-06-07 NOTE — Assessment & Plan Note (Signed)
Well-controlled on Cardizem, losartan and Toprol-XL 25 mg daily.  Also has palpitations, improved after beta blockers initiated. Still has occasional symptoms, pulse is usually in the 90s, pulse today 106. Plan: Increase Toprol to 1.5 tablets daily. Watch for asthma exacerbation

## 2014-06-07 NOTE — Progress Notes (Signed)
Subjective:    Patient ID: Kristy [name], female    DOB: [birth year], 69 y.o.   MRN: [id number]  DOS:  06/07/2014 Type of visit - description :  Here for Medicare AWV:  1. Risk factors based on Past M, S, F history: reviewed 2. Physical Activities: goes to the gym--- "shapes for women" ~ 3/week 3. Depression/mood: neg screening  4. Hearing:  Has hearing aids, to see audiologist next month 5. ADL's:  Independent , drives  6. Fall Risk: has h/o vertigo, s/p vestibular rehab, better, no recent falls 7. home Safety: does feel safe at home  8. Height, weight, & visual acuity: see VS, sees eye doctor reg, has an appoint next week 9. Counseling: provided 10. Labs ordered based on risk factors: if needed  11. Referral Coordination: if needed 12. Care Plan, see assessment and plan  13. Cognitive Assessment: cognition-motor skills appropriate for age  50. Care team updated 15. Personalized plan provided,  see instructions  In addition, today we discussed the following: htn-- good medication compliance, ambulatory BPs in the 120/80 range Palpitations, symptoms significantly decrease after beta blockers were initiated Asthma, good compliance w/ medication, symptoms are okay Fibromyalgia, per Dr. Charlynne Pander, has developed some back pain recently, she has back pain on-off and  wonders if is okay to take a leftover oxycodone (it is okay)   ROS Denies chest pain, difficulty breathing, lower extremity edema No nausea or vomiting. No blood in the stools. Occasional constipation and diarrhea which is at baseline. No dysuria, gross hematuria or difficulty urinating  Past Medical History  Diagnosis Date  . Asthma   . Hypertension   . Hiatal hernia   . SUI (stress urinary incontinence, female)     (resolved after surgery)  . Atrophic vaginitis   . Fibromyalgia     Dr Amil Amen  . Shortness of breath     occ  . GERD (gastroesophageal reflux disease)   . DJD (degenerative joint disease)    generalized joint pain, including back  . TIA (transient ischemic attack)     07/2012, see Dr Leonie Man  . History of kidney stones   . Vertigo   . Gout   . Palpitations     PSVT on Holter monitoring  . FHx: BRCA2 gene positive     BRCA2 mutation in sister  . Family history of malignant neoplasm of breast     Past Surgical History  Procedure Laterality Date  . Abdominal hysterectomy  1994    TAH,BSO-BLADDER NECK SUSPENSION  . Surgery for poserior tibial tendon  2003  . Cystocele repair  1994  . Cholecystectomy, laparoscopic  2001  . Tubal ligation    . Carpal tunnel release Right 2005  . Triple fusion left ankle Left 2007  . Oophorectomy      BSO  . Breast surgery      LUMPECTOMY-BENIGN Breast Bx X2  . Tonsillectomy    . Knee arthroscopy      bil  . Knee arthroplasty  05/19/2012    Procedure: COMPUTER ASSISTED TOTAL KNEE ARTHROPLASTY;  Surgeon: Marybelle Killings, MD;  Location: [facility];  Service: Orthopedics;  Laterality: Left;  Left  Total Knee Arthroplasty  . Knee arthroplasty Right 09/03/2013    Procedure: COMPUTER ASSISTED TOTAL KNEE ARTHROPLASTY;  Surgeon: Marybelle Killings, MD;  Location: [facility];  Service: Orthopedics;  Laterality: Right;  Right Total Knee Arthroplasty, Computer Assist    History   Social History  . Marital Status: Married  Spouse Name: [name]    Number of Children: 1  . Years of Education: 16   Occupational History  . 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 Smoking status: Never Smoker   . Smokeless tobacco: Never Used  . Alcohol Use: Yes     Comment: rare  . Drug Use: No  . Sexual Activity: No   Other Topics Concern  . 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 married '69. 2 sons - '70, '72: retired - [employer]. SO- good health.   ACP - OK for CPR at least once, no long term ventilation, no heroic measures in the face of poor quality of life. HCPOA - husband, secondary son Noelie Renfrow (c) [phone]        Family History  Problem Relation Age of Onset  . Breast cancer Mother 8    deceased 15  . Breast cancer Sister 66    bilateral breast ca @ 80; BRCA2 positive  . Hypertension Sister   . Cancer Father     Brain tumor; path?; deceased 57  . Lymphoma Maternal Aunt     deceased 71s  . Hemochromatosis Sister   . Thalassemia Sister   . Other Sister     negative for BRCA1 BRCA2  . Breast cancer Cousin     BRCA2 positive  . Diabetes Other   . Breast cancer Other     distant maternal female relatives with breast cancer  . Colon cancer Neg Hx   . CAD Son     MI         Medication List       This list is accurate as of: 06/07/14  6:15 PM.  Always use your most recent med list.               aspirin 325 MG tablet  Take 1 tablet (325 mg total) by mouth daily.     ASTEPRO 0.15 % Soln  Generic drug:  Azelastine HCl  Place 2 sprays into the nose daily.     beclomethasone 80 MCG/ACT inhaler  Commonly known as:  QVAR  Inhale 1 puff into the lungs 2 (two) times daily.     diltiazem 240 MG 24 hr capsule  Commonly known as:  CARDIZEM CD  TAKE ONE CAPSULE BY MOUTH EVERY DAY     EPIPEN 0.3 mg/0.3 mL Devi  Generic drug:  EPINEPHrine  Inject 0.3 mg into the muscle as needed (anaphylactic). For allergic reactions     furosemide 40 MG tablet  Commonly known as:  LASIX  TAKE 1 TABLET BY MOUTH EVERY DAY     HM VITAMIN D3 2000 UNITS Caps  Generic drug:  Cholecalciferol  Take 2,000 Units by mouth daily.     levalbuterol 45 MCG/ACT inhaler  Commonly known as:  XOPENEX HFA  Inhale 2 puffs into the lungs every 4 (four) hours as needed for shortness of breath.     levocetirizine 5 MG tablet  Commonly known as:  XYZAL  Take 5 mg by mouth daily.     losartan 50 MG tablet  Commonly known as:  COZAAR  TAKE 1 TABLET EVERY MORNING     metoprolol succinate 25 MG 24 hr tablet  Commonly known as:  TOPROL XL  1.5 tabs a day     NONFORMULARY OR COMPOUNDED ITEM  - Estradiol  .02%  - 1 ML Prefilled Applicator  -  Sig: apply vaginally twice a week  - #90 Day Supply with 4 refills     nortriptyline 25 MG capsule  Commonly known as:  PAMELOR  Take 25 mg by mouth at bedtime.     omeprazole 40 MG capsule  Commonly known as:  PRILOSEC  Take 1 capsule (40 mg total) by mouth daily.     PATADAY 0.2 % Soln  Generic drug:  Olopatadine HCl  Place 1 drop into both eyes daily.     vitamin E 400 UNIT capsule  Take 400 Units by mouth daily.           Objective:   Physical Exam BP 141/85 mmHg  Pulse 106  Temp(Src) 97.5 F (36.4 C) (Oral)  Ht 5' 5" (1.651 m)  Wt 212 lb (96.163 kg)  BMI 35.28 kg/m2  SpO2 95% General -- alert, well-developed, NAD.  Neck --no thyromegaly , normal carotid pulse  HEENT-- Not pale.   Lungs -- normal respiratory effort, no intercostal retractions, no accessory muscle use, and normal breath sounds.  Heart-- normal rate, regular rhythm, no murmur.  Abdomen-- Not distended, good bowel sounds,soft, non-tender. No bruit or mass Extremities-- no pretibial edema bilaterally  Neurologic--  alert & oriented X3. Speech normal, gait appropriate for age, strength symmetric and appropriate for age.  Psych-- Cognition and judgment appear intact. Cooperative with normal attention span and concentration. No anxious or depressed appearing.        Assessment & Plan:

## 2014-06-07 NOTE — Progress Notes (Signed)
Pre visit review using our clinic review tool, if applicable. No additional management support is needed unless otherwise documented below in the visit note. 

## 2014-06-07 NOTE — Assessment & Plan Note (Signed)
Previous PTHs slightly elevated, recheck labs

## 2014-06-07 NOTE — Assessment & Plan Note (Signed)
On lifestyle modification, check A1c, continue losartan

## 2014-06-07 NOTE — Assessment & Plan Note (Signed)
Td 2012 Pneumonia showed 2007 Prevnar  2015 Had a flu shot Female care per Dr. Toney Rakes, last mammogram 05-2014 negative. Had a colonoscopy in 2012, was recommended a 5 year follow-up. + FH  of breast cancer, and the patient is BRCA negative + Family history of CAD Diet and exercise discussed

## 2014-06-07 NOTE — Patient Instructions (Signed)
Get your blood work before you leave   Increase metoprolol to 1.5 tablets a day, watch your asthma   Please come back to the office in 6 months for a routine check up   Come back fasting       Fall Prevention and Home Safety Falls cause injuries and can affect all age groups. It is possible to use preventive measures to significantly decrease the likelihood of falls. There are many simple measures which can make your home safer and prevent falls. OUTDOORS  Repair cracks and edges of walkways and driveways.  Remove high doorway thresholds.  Trim shrubbery on the main path into your home.  Have good outside lighting.  Clear walkways of tools, rocks, debris, and clutter.  Check that handrails are not broken and are securely fastened. Both sides of steps should have handrails.  Have leaves, snow, and ice cleared regularly.  Use sand or salt on walkways during winter months.  In the garage, clean up grease or oil spills. BATHROOM  Install night lights.  Install grab bars by the toilet and in the tub and shower.  Use non-skid mats or decals in the tub or shower.  Place a plastic non-slip stool in the shower to sit on, if needed.  Keep floors dry and clean up all water on the floor immediately.  Remove soap buildup in the tub or shower on a regular basis.  Secure bath mats with non-slip, double-sided rug tape.  Remove throw rugs and tripping hazards from the floors. BEDROOMS  Install night lights.  Make sure a bedside light is easy to reach.  Do not use oversized bedding.  Keep a telephone by your bedside.  Have a firm chair with side arms to use for getting dressed.  Remove throw rugs and tripping hazards from the floor. KITCHEN  Keep handles on pots and pans turned toward the center of the stove. Use back burners when possible.  Clean up spills quickly and allow time for drying.  Avoid walking on wet floors.  Avoid hot utensils and knives.  Position  shelves so they are not too high or low.  Place commonly used objects within easy reach.  If necessary, use a sturdy step stool with a grab bar when reaching.  Keep electrical cables out of the way.  Do not use floor polish or wax that makes floors slippery. If you must use wax, use non-skid floor wax.  Remove throw rugs and tripping hazards from the floor. STAIRWAYS  Never leave objects on stairs.  Place handrails on both sides of stairways and use them. Fix any loose handrails. Make sure handrails on both sides of the stairways are as long as the stairs.  Check carpeting to make sure it is firmly attached along stairs. Make repairs to worn or loose carpet promptly.  Avoid placing throw rugs at the top or bottom of stairways, or properly secure the rug with carpet tape to prevent slippage. Get rid of throw rugs, if possible.  Have an electrician put in a light switch at the top and bottom of the stairs. OTHER FALL PREVENTION TIPS  Wear low-heel or rubber-soled shoes that are supportive and fit well. Wear closed toe shoes.  When using a stepladder, make sure it is fully opened and both spreaders are firmly locked. Do not climb a closed stepladder.  Add color or contrast paint or tape to grab bars and handrails in your home. Place contrasting color strips on first and last steps.  Learn and use mobility aids as needed. Install an electrical emergency response system.  Turn on lights to avoid dark areas. Replace light bulbs that burn out immediately. Get light switches that glow.  Arrange furniture to create clear pathways. Keep furniture in the same place.  Firmly attach carpet with non-skid or double-sided tape.  Eliminate uneven floor surfaces.  Select a carpet pattern that does not visually hide the edge of steps.  Be aware of all pets. OTHER HOME SAFETY TIPS  Set the water temperature for 120 F (48.8 C).  Keep emergency numbers on or near the telephone.  Keep  smoke detectors on every level of the home and near sleeping areas. Document Released: 05/07/2002 Document Revised: 11/16/2011 Document Reviewed: 08/06/2011 [facility] Patient Information 2015 [place], [place]. This information is not intended to replace advice given to you by your health care provider. Make sure you discuss any questions you have with your health care provider.     Preventive Care for Adults   Ages 43 years and over  Blood pressure check.** / Every 1 to 2 years.  Lipid and cholesterol check.** / Every 5 years beginning at age 73 years.  Lung cancer screening. / Every year if you are aged 71-80 years and have a 30-pack-year history of smoking and currently smoke or have quit within the past 15 years. Yearly screening is stopped once you have quit smoking for at least 15 years or develop a health problem that would prevent you from having lung cancer treatment.  Clinical breast exam.** / Every year after age 29 years.  BRCA-related cancer risk assessment.** / For women who have family members with a BRCA-related cancer (breast, ovarian, tubal, or peritoneal cancers).  Mammogram.** / Every year beginning at age 15 years and continuing for as long as you are in good health. Consult with your health care provider.  Pap test.** / Every 3 years starting at age 68 years through age 61 or 45 years with 3 consecutive normal Pap tests. Testing can be stopped between 65 and 70 years with 3 consecutive normal Pap tests and no abnormal Pap or HPV tests in the past 10 years.  HPV screening.** / Every 3 years from ages 5 years through ages 20 or 50 years with a history of 3 consecutive normal Pap tests. Testing can be stopped between 65 and 70 years with 3 consecutive normal Pap tests and no abnormal Pap or HPV tests in the past 10 years.  Fecal occult blood test (FOBT) of stool. / Every year beginning at age 23 years and continuing until age 103 years. You may not need to do this test if  you get a colonoscopy every 10 years.  Flexible sigmoidoscopy or colonoscopy.** / Every 5 years for a flexible sigmoidoscopy or every 10 years for a colonoscopy beginning at age 66 years and continuing until age 27 years.  Hepatitis C blood test.** / For all people born from [birth year] through 1966 and any individual with known risks for hepatitis C.  Osteoporosis screening.** / A one-time screening for women ages 49 years and over and women at risk for fractures or osteoporosis.  Skin self-exam. / Monthly.  Influenza vaccine. / Every year.  Tetanus, diphtheria, and acellular pertussis (Tdap/Td) vaccine.** / 1 dose of Td every 10 years.  Varicella vaccine.** / Consult your health care provider.  Zoster vaccine.** / 1 dose for adults aged 85 years or older.  Pneumococcal 13-valent conjugate (PCV13) vaccine.** / Consult your health care provider.  Pneumococcal polysaccharide (PPSV23) vaccine.** / 1 dose for all adults aged 74 years and older.  Meningococcal vaccine.** / Consult your health care provider.  Hepatitis A vaccine.** / Consult your health care provider.  Hepatitis B vaccine.** / Consult your health care provider.  Haemophilus influenzae type b (Hib) vaccine.** / Consult your health care provider. ** Family history and personal history of risk and conditions may change your health care provider's recommendations. Document Released: 07/13/2001 Document Revised: 10/01/2013 Document Reviewed: 10/12/2010 [facility] Patient Information 2015 [place], [place]. This information is not intended to replace advice given to you by your health care provider. Make sure you discuss any questions you have with your health care provider.

## 2014-06-07 NOTE — Assessment & Plan Note (Signed)
Status post vestibular rehabilitation, reports significant improvement

## 2014-06-10 LAB — PARATHYROID HORMONE, INTACT (NO CA): PTH: 58 pg/mL (ref 14–64)

## 2014-06-13 ENCOUNTER — Encounter: Payer: Self-pay | Admitting: Internal Medicine

## 2014-06-13 DIAGNOSIS — E119 Type 2 diabetes mellitus without complications: Secondary | ICD-10-CM | POA: Diagnosis not present

## 2014-06-13 DIAGNOSIS — J3089 Other allergic rhinitis: Secondary | ICD-10-CM | POA: Diagnosis not present

## 2014-06-13 DIAGNOSIS — J3081 Allergic rhinitis due to animal (cat) (dog) hair and dander: Secondary | ICD-10-CM | POA: Diagnosis not present

## 2014-06-13 DIAGNOSIS — J301 Allergic rhinitis due to pollen: Secondary | ICD-10-CM | POA: Diagnosis not present

## 2014-06-13 MED ORDER — METFORMIN HCL 850 MG PO TABS
850.0000 mg | ORAL_TABLET | Freq: Two times a day (BID) | ORAL | Status: DC
Start: 2014-06-13 — End: 2014-10-09

## 2014-06-13 NOTE — Addendum Note (Signed)
Addended by: Wilfrid Lund on: 06/13/2014 09:08 AM   Modules accepted: Orders

## 2014-06-27 DIAGNOSIS — J3081 Allergic rhinitis due to animal (cat) (dog) hair and dander: Secondary | ICD-10-CM | POA: Diagnosis not present

## 2014-06-27 DIAGNOSIS — J301 Allergic rhinitis due to pollen: Secondary | ICD-10-CM | POA: Diagnosis not present

## 2014-06-27 DIAGNOSIS — J3089 Other allergic rhinitis: Secondary | ICD-10-CM | POA: Diagnosis not present

## 2014-07-04 DIAGNOSIS — J3089 Other allergic rhinitis: Secondary | ICD-10-CM | POA: Diagnosis not present

## 2014-07-04 DIAGNOSIS — J301 Allergic rhinitis due to pollen: Secondary | ICD-10-CM | POA: Diagnosis not present

## 2014-07-04 DIAGNOSIS — J3081 Allergic rhinitis due to animal (cat) (dog) hair and dander: Secondary | ICD-10-CM | POA: Diagnosis not present

## 2014-07-08 DIAGNOSIS — J301 Allergic rhinitis due to pollen: Secondary | ICD-10-CM | POA: Diagnosis not present

## 2014-07-08 DIAGNOSIS — J3089 Other allergic rhinitis: Secondary | ICD-10-CM | POA: Diagnosis not present

## 2014-07-08 DIAGNOSIS — H1045 Other chronic allergic conjunctivitis: Secondary | ICD-10-CM | POA: Diagnosis not present

## 2014-07-08 DIAGNOSIS — J3081 Allergic rhinitis due to animal (cat) (dog) hair and dander: Secondary | ICD-10-CM | POA: Diagnosis not present

## 2014-07-08 DIAGNOSIS — J4530 Mild persistent asthma, uncomplicated: Secondary | ICD-10-CM | POA: Diagnosis not present

## 2014-07-16 DIAGNOSIS — H18413 Arcus senilis, bilateral: Secondary | ICD-10-CM | POA: Diagnosis not present

## 2014-07-16 DIAGNOSIS — H2512 Age-related nuclear cataract, left eye: Secondary | ICD-10-CM | POA: Diagnosis not present

## 2014-07-16 DIAGNOSIS — H2511 Age-related nuclear cataract, right eye: Secondary | ICD-10-CM | POA: Diagnosis not present

## 2014-07-16 DIAGNOSIS — H02839 Dermatochalasis of unspecified eye, unspecified eyelid: Secondary | ICD-10-CM | POA: Diagnosis not present

## 2014-07-16 DIAGNOSIS — E119 Type 2 diabetes mellitus without complications: Secondary | ICD-10-CM | POA: Diagnosis not present

## 2014-07-29 ENCOUNTER — Other Ambulatory Visit: Payer: Self-pay | Admitting: Internal Medicine

## 2014-07-30 ENCOUNTER — Other Ambulatory Visit: Payer: Self-pay

## 2014-07-30 MED ORDER — OMEPRAZOLE 40 MG PO CPDR: 40.0000 mg | DELAYED_RELEASE_CAPSULE | Freq: Every day | ORAL | Status: DC

## 2014-07-31 ENCOUNTER — Encounter: Payer: Self-pay | Admitting: Neurology

## 2014-07-31 ENCOUNTER — Ambulatory Visit (INDEPENDENT_AMBULATORY_CARE_PROVIDER_SITE_OTHER): Payer: Medicare Other | Admitting: Neurology

## 2014-07-31 VITALS — BP 124/74 | HR 99 | Ht 65.0 in | Wt 212.6 lb

## 2014-07-31 DIAGNOSIS — I6529 Occlusion and stenosis of unspecified carotid artery: Secondary | ICD-10-CM

## 2014-07-31 NOTE — Progress Notes (Signed)
PATIENT: Kristy [name] DOB: 1945/12/08  REASON FOR VISIT: routine follow up for TIA HISTORY FROM: patient  HISTORY OF PRESENT ILLNESS: 69 year old Caucasian lady seen for her first office followup visit following hospital admission for TIA. She was admitted on 07/14/12 with sudden onset of numbness involving the left face and slurred speech following a brief occipital headache. Her headache and speech symptoms resolved but left facial numbness persisted. NIH stroke scale on admission was 0. CT scan was unremarkable. MRI scan did not show any acute infarct. MRA of the brain showed no large vessel stenosis. Lipid profile was normal hemoglobin A1c was borderline at 6.4%. Transthoracic echo showed normal ejection fraction without cardiac source of embolism. MRA of the neck showed only mild plaque without hemodynamically significant stenosis. She was started on aspirin for stroke prevention. She denied history of migraine headaches. She states she has done well since discharge; her headache and speech problems have completely resolved. She states her blood pressure has been under good control and it is 128/7 in office today. She has no new complaints. She plans to exercise regularly and start going to a swimming pool.   UPDATE 03/01/13 (LL): Mrs. Kristy [name] comes in for TIA followup. She has had no recurrent TIA symptoms, but occasionally gets a brief sharp pain in her head. She has an area on her left cheek that she says has decreased sensation since the TIA. She has difficulty with dizziness and motion sickness, and sometimes falls for no reason. She sometimes uses a cane to help with balance. She denies weakness in her legs but has had total knee replacement on the left and needs the right side done, plans are for next month. She states her left ankle has 4 screws in it as well. She states her blood pressure is well controlled, is 142/83 in office today.   UPDATE 11/28/13 (LL):  Since last visit, patient had  right total knee replacement, without complication.  Since surgery she has been much more mobile and can walk much longer distances.  She still feels somewhat off-balance at times. She has had no recurrent neurovascular symptoms.  Blood pressure is slightly elevated today at 151/86.  She is tolerating aspirin well with no signs of significant bleeding or bruising.  She has had more palpitations lately and wore a holter monitor 48 hours last week but does not know the results.  Her HR is 109 in the office today, sitting down.  Otherwise, she feels well. Update 07/31/2014 ( PS) : She returns for follow-up after last visit 6 months ago. She continues to do well from neurovascular standpoint without recurrent stroke or TIA symptoms. She is tolerating aspirin well without significant bleeding or bruising. She states her blood pressure is usually in the 120s to 130s. She does however complain of some side effects from of tiredness, blood pressure medicines. She has not been able to exercise regularly and has not lost any weight. She complains of her right ear being blocked and not hearing well despite hearing aids and having a feeling of pressure behind the ears. She wonders whether this could be related to allergies and I advised her to see her primary care physician to evaluate this further. She has no new complaints today. She has not been regularly checking her fasting sugar. Her last hemoglobin A1c was 7.1 checked last month. ROS:  14 system review of systems is positive for  light sensitivity, loss of vision, blurred vision, insomnia, frequent waking, daytime sleepiness  All other systems negative.   ALLERGIES: Allergies  Allergen Reactions  . Iodine Anaphylaxis  . Shellfish Allergy Anaphylaxis  . Sulfonamide Derivatives Rash  . Other Swelling    Citrus  Sometimes Lactose intolerant  . Amoxicillin Diarrhea    HOME MEDICATIONS: Outpatient Prescriptions Prior to Visit  Medication Sig Dispense Refill    . aspirin 325 MG tablet Take 1 tablet (325 mg total) by mouth daily.    . Azelastine HCl (ASTEPRO) 0.15 % SOLN Place 2 sprays into the nose daily.    . beclomethasone (QVAR) 80 MCG/ACT inhaler Inhale 1 puff into the lungs 2 (two) times daily.    . Cholecalciferol (HM VITAMIN D3) 2000 UNITS CAPS Take 2,000 Units by mouth daily.    Marland Kitchen diltiazem (CARDIZEM CD) 240 MG 24 hr capsule TAKE ONE CAPSULE BY MOUTH EVERY DAY 30 capsule 2  . EPINEPHrine (EPIPEN) 0.3 mg/0.3 mL DEVI Inject 0.3 mg into the muscle as needed (anaphylactic). For allergic reactions    . furosemide (LASIX) 40 MG tablet TAKE 1 TABLET BY MOUTH EVERY DAY 30 tablet 3  . levalbuterol (XOPENEX HFA) 45 MCG/ACT inhaler Inhale 2 puffs into the lungs every 4 (four) hours as needed for shortness of breath.     . levocetirizine (XYZAL) 5 MG tablet Take 5 mg by mouth daily.    Marland Kitchen losartan (COZAAR) 50 MG tablet TAKE 1 TABLET EVERY MORNING 30 tablet 6  . metFORMIN (GLUCOPHAGE) 850 MG tablet Take 1 tablet (850 mg total) by mouth 2 (two) times daily with a meal. For first week take only 1 tablet in the morning with a meal. 60 tablet 4  . metoprolol succinate (TOPROL XL) 25 MG 24 hr tablet 1.5 tabs a day 60 tablet 6  . NONFORMULARY OR COMPOUNDED ITEM Estradiol .02% 1 ML Prefilled Applicator Sig: apply vaginally twice a week #90 Day Supply with 4 refills 1 each 4  . nortriptyline (PAMELOR) 25 MG capsule Take 25 mg by mouth at bedtime.      . Olopatadine HCl (PATADAY) 0.2 % SOLN Place 1 drop into both eyes daily.    Marland Kitchen omeprazole (PRILOSEC) 40 MG capsule Take 1 capsule (40 mg total) by mouth daily. 30 capsule 5  . vitamin E 400 UNIT capsule Take 400 Units by mouth daily.     No facility-administered medications prior to visit.    PHYSICAL EXAM Filed Vitals:   07/31/14 1006  BP: 124/74  Pulse: 99  Height: 5\' 5"  (1.651 m)  Weight: 212 lb 9.6 oz (96.435 kg)   Body mass index is 35.38 kg/(m^2).  General: Obese middle-aged Caucasian lady, seated,  in no evident distress  Head: head normocephalic and atraumatic.   Neck: supple with no carotid or supraclavicular bruits  Cardiovascular: regular rate and rhythm, no murmurs  Musculoskeletal: no deformity  Skin: no rash/petichiae  Vascular: Normal pulses all extremities  Neurologic Exam  Mental Status: Awake and fully alert. Oriented to place and time. Recent and remote memory intact. Attention span, concentration and fund of knowledge appropriate. Mood and affect appropriate.  Cranial Nerves: Pupils equal, briskly reactive to light. Extraocular movements full without nystagmus. Visual fields full to confrontation. Hearing intact. Facial sensation intact. Face, tongue, palate moves normally and symmetrically.  Motor: Normal bulk and tone. Normal strength in all tested extremity muscles.  Sensory.: intact to tough and pinprick and vibratory.  Coordination: Rapid alternating movements normal in all extremities. Finger-to-nose and heel-to-shin performed accurately bilaterally.  Gait and Station: Arises from chair  without difficulty. Stance is normal. Gait demonstrates normal stride length and balance . Unable to heel, toe and tandem walk without difficulty.  Reflexes: 1+ and symmetric. Toes downgoing.   MRI HEAD WITHOUT AND WITH CONTRAST 07/15/12  Mild atrophy. Mild to moderate chronic microvascular ischemic change. No acute stroke. No abnormal enhancement.  MRA HEAD WITHOUT CONTRAST 07/15/12  Unremarkable MR angiography intracranial circulation.  MRA NECK WITHOUT AND WITH CONTRAST 07/15/12  Mild posterior wall plaque right internal carotid artery origin, without flow reducing stenosis or dissection. Otherwise unremarkable study.   ASSESSMENT AND PLAN  32 year lady with a right brain TIA in February 2014 likely due to small vessel disease. Vascular risk factors of hypertension, age and sex only. Doing well, with no recurrent symptoms.  PLAN:    I had a long d/w patient about her remote TIA,  risk for recurrent stroke/TIAs, personally independently reviewed imaging studies and stroke evaluation results and answered questions.Continue aspirin 325 mg orally every day  for secondary stroke prevention and maintain strict control of hypertension with blood pressure goal below 130/90, diabetes with hemoglobin A1c goal below 6.5% and lipids with LDL cholesterol goal below 70 mg/dL. I also advised the patient to eat a healthy diet with plenty of whole grains, cereals, fruits and vegetables, exercise regularly and maintain ideal body weight. Check screening follow-up carotid ultrasound study. Followup in the future with me in one year.   Return in about 1 year (around 07/31/2015).  Antony Contras, MD  07/31/2014, 10:34 AM [facility] [address] [place], [place] [address] [phone]  Note: This document was prepared with digital dictation and possible smart phrase technology. Any transcriptional errors that result from this process are unintentional.

## 2014-07-31 NOTE — Patient Instructions (Signed)
I had a long d/w patient about her remote TIA, risk for recurrent stroke/TIAs, personally independently reviewed imaging studies and stroke evaluation results and answered questions.Continue aspirin 325 mg orally every day  for secondary stroke prevention and maintain strict control of hypertension with blood pressure goal below 130/90, diabetes with hemoglobin A1c goal below 6.5% and lipids with LDL cholesterol goal below 70 mg/dL. I also advised the patient to eat a healthy diet with plenty of whole grains, cereals, fruits and vegetables, exercise regularly and maintain ideal body weight. Check screening follow-up carotid ultrasound study. Followup in the future with me in one year.

## 2014-08-05 ENCOUNTER — Other Ambulatory Visit: Payer: Self-pay | Admitting: Internal Medicine

## 2014-08-12 DIAGNOSIS — M1A09X0 Idiopathic chronic gout, multiple sites, without tophus (tophi): Secondary | ICD-10-CM | POA: Diagnosis not present

## 2014-08-12 DIAGNOSIS — M797 Fibromyalgia: Secondary | ICD-10-CM | POA: Diagnosis not present

## 2014-08-12 DIAGNOSIS — M150 Primary generalized (osteo)arthritis: Secondary | ICD-10-CM | POA: Diagnosis not present

## 2014-08-15 ENCOUNTER — Ambulatory Visit (INDEPENDENT_AMBULATORY_CARE_PROVIDER_SITE_OTHER): Payer: Medicare Other

## 2014-08-15 DIAGNOSIS — I6529 Occlusion and stenosis of unspecified carotid artery: Secondary | ICD-10-CM | POA: Diagnosis not present

## 2014-09-02 DIAGNOSIS — S60454A Superficial foreign body of right ring finger, initial encounter: Secondary | ICD-10-CM | POA: Diagnosis not present

## 2014-09-09 DIAGNOSIS — H2512 Age-related nuclear cataract, left eye: Secondary | ICD-10-CM | POA: Diagnosis not present

## 2014-09-09 DIAGNOSIS — H25812 Combined forms of age-related cataract, left eye: Secondary | ICD-10-CM | POA: Diagnosis not present

## 2014-09-10 DIAGNOSIS — H2511 Age-related nuclear cataract, right eye: Secondary | ICD-10-CM | POA: Diagnosis not present

## 2014-09-23 DIAGNOSIS — H2511 Age-related nuclear cataract, right eye: Secondary | ICD-10-CM | POA: Diagnosis not present

## 2014-09-23 DIAGNOSIS — H25011 Cortical age-related cataract, right eye: Secondary | ICD-10-CM | POA: Diagnosis not present

## 2014-09-23 DIAGNOSIS — H25811 Combined forms of age-related cataract, right eye: Secondary | ICD-10-CM | POA: Diagnosis not present

## 2014-10-09 ENCOUNTER — Ambulatory Visit (INDEPENDENT_AMBULATORY_CARE_PROVIDER_SITE_OTHER): Payer: Medicare Other | Admitting: Internal Medicine

## 2014-10-09 ENCOUNTER — Encounter: Payer: Self-pay | Admitting: Internal Medicine

## 2014-10-09 VITALS — BP 128/74 | HR 85 | Temp 98.5°F | Ht 65.0 in | Wt 206.2 lb

## 2014-10-09 DIAGNOSIS — I10 Essential (primary) hypertension: Secondary | ICD-10-CM | POA: Diagnosis not present

## 2014-10-09 DIAGNOSIS — H9190 Unspecified hearing loss, unspecified ear: Secondary | ICD-10-CM

## 2014-10-09 DIAGNOSIS — E119 Type 2 diabetes mellitus without complications: Secondary | ICD-10-CM | POA: Diagnosis not present

## 2014-10-09 DIAGNOSIS — I6529 Occlusion and stenosis of unspecified carotid artery: Secondary | ICD-10-CM | POA: Diagnosis not present

## 2014-10-09 LAB — BASIC METABOLIC PANEL
BUN: 15 mg/dL (ref 6–23)
CHLORIDE: 103 meq/L (ref 96–112)
CO2: 27 mEq/L (ref 19–32)
CREATININE: 0.74 mg/dL (ref 0.40–1.20)
Calcium: 9.5 mg/dL (ref 8.4–10.5)
GFR: 82.49 mL/min (ref >60.00)
Glucose, Bld: 113 mg/dL — ABNORMAL HIGH (ref 70–99)
Potassium: 3.6 mEq/L (ref 3.5–5.1)
Sodium: 139 mEq/L (ref 135–145)

## 2014-10-09 LAB — HEMOGLOBIN A1C: Hgb A1c MFr Bld: 6.2 % (ref 4.6–6.5)

## 2014-10-09 LAB — HM DIABETES FOOT EXAM: HM DIABETIC FOOT EXAM: NORMAL

## 2014-10-09 LAB — ALT: ALT: 44 U/L — AB (ref 0–35)

## 2014-10-09 LAB — AST: AST: 25 U/L (ref 0–37)

## 2014-10-09 MED ORDER — OMEPRAZOLE 40 MG PO CPDR: 40.0000 mg | DELAYED_RELEASE_CAPSULE | Freq: Every day | ORAL | Status: DC

## 2014-10-09 MED ORDER — METOPROLOL SUCCINATE ER 25 MG PO TB24: 37.5000 mg | ORAL_TABLET | Freq: Every day | ORAL | Status: DC

## 2014-10-09 MED ORDER — LOSARTAN POTASSIUM 50 MG PO TABS: 50.0000 mg | ORAL_TABLET | Freq: Every morning | ORAL | Status: DC

## 2014-10-09 MED ORDER — METFORMIN HCL 850 MG PO TABS: 850.0000 mg | ORAL_TABLET | Freq: Two times a day (BID) | ORAL | Status: DC

## 2014-10-09 MED ORDER — FUROSEMIDE 40 MG PO TABS: 40.0000 mg | ORAL_TABLET | Freq: Every day | ORAL | Status: DC

## 2014-10-09 MED ORDER — DILTIAZEM HCL ER COATED BEADS 240 MG PO CP24: 240.0000 mg | ORAL_CAPSULE | Freq: Every day | ORAL | Status: DC

## 2014-10-09 NOTE — Progress Notes (Signed)
Pre visit review using our clinic review tool, if applicable. No additional management support is needed unless otherwise documented below in the visit note. 

## 2014-10-09 NOTE — Patient Instructions (Signed)
Get your blood work before you leave   Come back to the office in 4 months   for a routine check up   

## 2014-10-09 NOTE — Progress Notes (Signed)
Subjective:    Patient ID: Kristy [name], female    DOB: 09/25/1945, 69 y.o.   MRN: [id number]  DOS:  10/09/2014 Type of visit - description : rov Interval history: Diabetes, started metformin, after few weeks is now tolerating it well, no ambulatory CBGs Hypertension, beta blockers dose was increased, BP today is very good, no recent ambulatory BPs. Palpitations: Not an issue at this point Allergies,  Allergy shots had to be stopped because we increased beta blockers dose (Epipen wouldn't work) . Despite that, her allergies are currently relatively well controlled Decreased hearing, hearing aids need to be check, needs a referral  Review of Systems In the past she complained about fatigue, that is improve. Denies chest pain or difficulty breathing. After she is started metformin, she developed diarrhea and stomach discomfort, different from IBS. Currently doing better, denies further diarrhea or blood in the stools.  Past Medical History  Diagnosis Date  . Asthma   . Hypertension   . Hiatal hernia   . SUI (stress urinary incontinence, female)     (resolved after surgery)  . Atrophic vaginitis   . Fibromyalgia     Dr Amil Amen  . Shortness of breath     occ  . GERD (gastroesophageal reflux disease)   . DJD (degenerative joint disease)     generalized joint pain, including back  . TIA (transient ischemic attack)     07/2012, see Dr Leonie Man  . History of kidney stones   . Vertigo   . Gout   . Palpitations     PSVT on Holter monitoring  . FHx: BRCA2 gene positive     BRCA2 mutation in sister  . Family history of malignant neoplasm of breast     Past Surgical History  Procedure Laterality Date  . Abdominal hysterectomy  1994    TAH,BSO-BLADDER NECK SUSPENSION  . Surgery for poserior tibial tendon  2003  . Cystocele repair  1994  . Cholecystectomy, laparoscopic  2001  . Tubal ligation    . Carpal tunnel release Right 2005  . Triple fusion left ankle Left 2007  .  Oophorectomy      BSO  . Breast surgery      LUMPECTOMY-BENIGN Breast Bx X2  . Tonsillectomy    . Knee arthroscopy      bil  . Knee arthroplasty  05/19/2012    Procedure: COMPUTER ASSISTED TOTAL KNEE ARTHROPLASTY;  Surgeon: Marybelle Killings, MD;  Location: [facility];  Service: Orthopedics;  Laterality: Left;  Left  Total Knee Arthroplasty  . Knee arthroplasty Right 09/03/2013    Procedure: COMPUTER ASSISTED TOTAL KNEE ARTHROPLASTY;  Surgeon: Marybelle Killings, MD;  Location: [facility];  Service: Orthopedics;  Laterality: Right;  Right Total Knee Arthroplasty, Computer Assist  . Eye surgery Bilateral 2016    Cataract     History   Social History  . Marital Status: Married    Spouse Name: [name]  . Number of Children: 1  . Years of Education: 16   Occupational History  . 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 Smoking status: Never Smoker   . Smokeless tobacco: Never Used  . Alcohol Use: 0.0 oz/week    0 Standard drinks or equivalent per week     Comment: rare  . Drug Use: No  . Sexual Activity: No   Other Topics Concern  . Not on file   Social History Narrative   HSG, 1 year college  for accounting. married '69. 2 sons - '70, '72: retired - [employer]. SO- good health.   ACP - OK for CPR at least once, no long term ventilation, no heroic measures in the face of poor quality of life. HCPOA - husband, secondary son Kately Graffam (c) [phone]            Medication List       This list is accurate as of: 10/09/14 11:59 PM.  Always use your most recent med list.               aspirin 325 MG tablet  Take 1 tablet (325 mg total) by mouth daily.     ASTEPRO 0.15 % Soln  Generic drug:  Azelastine HCl  Place 2 sprays into the nose daily.     beclomethasone 80 MCG/ACT inhaler  Commonly known as:  QVAR  Inhale 1 puff into the lungs 2 (two) times daily.     BESIVANCE 0.6 % Susp  Generic drug:  Besifloxacin HCl  Place 1 drop into both eyes 3 (three) times daily.      diltiazem 240 MG 24 hr capsule  Commonly known as:  CARDIZEM CD  Take 1 capsule (240 mg total) by mouth daily.     DUREZOL 0.05 % Emul  Generic drug:  Difluprednate  Place 1 drop into both eyes 3 (three) times daily.     EPIPEN 0.3 mg/0.3 mL Devi  Generic drug:  EPINEPHrine  Inject 0.3 mg into the muscle as needed (anaphylactic). For allergic reactions     furosemide 40 MG tablet  Commonly known as:  LASIX  Take 1 tablet (40 mg total) by mouth daily.     HM VITAMIN D3 2000 UNITS Caps  Generic drug:  Cholecalciferol  Take 2,000 Units by mouth daily.     ILEVRO 0.3 % ophthalmic suspension  Generic drug:  nepafenac  Place 1 drop into both eyes 3 (three) times daily.     levalbuterol 45 MCG/ACT inhaler  Commonly known as:  XOPENEX HFA  Inhale 2 puffs into the lungs every 4 (four) hours as needed for shortness of breath.     levocetirizine 5 MG tablet  Commonly known as:  XYZAL  Take 5 mg by mouth daily.     losartan 50 MG tablet  Commonly known as:  COZAAR  Take 1 tablet (50 mg total) by mouth every morning.     metFORMIN 850 MG tablet  Commonly known as:  GLUCOPHAGE  Take 1 tablet (850 mg total) by mouth 2 (two) times daily with a meal.     metoprolol succinate 25 MG 24 hr tablet  Commonly known as:  TOPROL XL  Take 1.5 tablets (37.5 mg total) by mouth daily.     NONFORMULARY OR COMPOUNDED ITEM  - Estradiol .02%  - 1 ML Prefilled Applicator  - Sig: apply vaginally twice a week  - #90 Day Supply with 4 refills     nortriptyline 25 MG capsule  Commonly known as:  PAMELOR  Take 25 mg by mouth at bedtime.     omeprazole 40 MG capsule  Commonly known as:  PRILOSEC  Take 1 capsule (40 mg total) by mouth daily.     PATADAY 0.2 % Soln  Generic drug:  Olopatadine HCl  Place 1 drop into both eyes daily.     vitamin E 400 UNIT capsule  Take 400 Units by mouth daily.           Objective:  Physical Exam BP 128/74 mmHg  Pulse 85  Temp(Src) 98.5 F (36.9 C)  (Oral)  Ht '5\' 5"'  (1.651 m)  Wt 206 lb 4 oz (93.554 kg)  BMI 34.32 kg/m2  SpO2 97% General:   Well developed, well nourished . NAD.  HEENT:  Normocephalic . Face symmetric, atraumatic Lungs:  CTA B Normal respiratory effort, no intercostal retractions, no accessory muscle use. Heart: RRR,  no murmur.  No pretibial edema bilaterally  Diabetic feet exam: Pedal pulses, pinprick examination normal, skin and nails normal. No edema. Neurologic:  alert & oriented X3.  Speech normal, gait appropriate for age and unassisted Psych--  Cognition and judgment appear intact.  Cooperative with normal attention span and concentration.  Behavior appropriate. No anxious or depressed appearing.        Assessment & Plan:   HOH, refer to [facility] hearing

## 2014-10-10 ENCOUNTER — Encounter: Payer: Self-pay | Admitting: Internal Medicine

## 2014-10-10 NOTE — Assessment & Plan Note (Signed)
  Hypertension, Metoprolol dose was increased, ambulatory BP today is very good. Refill meds, check a BMP Palpitations, dose increased, now essentially asymptomatic

## 2014-10-10 NOTE — Assessment & Plan Note (Signed)
Diabetes, Based  on the last A1c she was prescribed metformin, initially she had GI side effects but now is tolerating the medication well. Feet exam negative today Plan: Refill meds, AST, ALT, A1c

## 2014-10-14 DIAGNOSIS — H903 Sensorineural hearing loss, bilateral: Secondary | ICD-10-CM | POA: Diagnosis not present

## 2014-10-26 ENCOUNTER — Other Ambulatory Visit: Payer: Self-pay | Admitting: Internal Medicine

## 2014-10-29 DIAGNOSIS — H6983 Other specified disorders of Eustachian tube, bilateral: Secondary | ICD-10-CM | POA: Diagnosis not present

## 2014-10-29 DIAGNOSIS — H903 Sensorineural hearing loss, bilateral: Secondary | ICD-10-CM | POA: Diagnosis not present

## 2014-10-29 DIAGNOSIS — H9313 Tinnitus, bilateral: Secondary | ICD-10-CM | POA: Diagnosis not present

## 2014-10-30 ENCOUNTER — Other Ambulatory Visit (INDEPENDENT_AMBULATORY_CARE_PROVIDER_SITE_OTHER): Payer: Self-pay | Admitting: Otolaryngology

## 2014-10-30 DIAGNOSIS — H918X9 Other specified hearing loss, unspecified ear: Secondary | ICD-10-CM

## 2014-11-07 ENCOUNTER — Ambulatory Visit: Payer: Medicare Other | Admitting: Internal Medicine

## 2014-11-14 ENCOUNTER — Ambulatory Visit
Admission: RE | Admit: 2014-11-14 | Discharge: 2014-11-14 | Disposition: A | Discharge status: Home / Self Care | Payer: Medicare Other | Source: Ambulatory Visit | Attending: Otolaryngology | Admitting: Otolaryngology

## 2014-11-14 DIAGNOSIS — H918X1 Other specified hearing loss, right ear: Secondary | ICD-10-CM | POA: Diagnosis not present

## 2014-11-14 DIAGNOSIS — H918X9 Other specified hearing loss, unspecified ear: Secondary | ICD-10-CM

## 2014-11-14 MED ORDER — GADOBENATE DIMEGLUMINE 529 MG/ML IV SOLN
19.0000 mL | Freq: Once | INTRAVENOUS | Status: AC | PRN
  Administered 2014-11-14: 19 mL via INTRAVENOUS

## 2014-11-26 DIAGNOSIS — H903 Sensorineural hearing loss, bilateral: Secondary | ICD-10-CM | POA: Diagnosis not present

## 2015-01-14 DIAGNOSIS — J301 Allergic rhinitis due to pollen: Secondary | ICD-10-CM | POA: Diagnosis not present

## 2015-01-14 DIAGNOSIS — J3089 Other allergic rhinitis: Secondary | ICD-10-CM | POA: Diagnosis not present

## 2015-01-14 DIAGNOSIS — H1045 Other chronic allergic conjunctivitis: Secondary | ICD-10-CM | POA: Diagnosis not present

## 2015-01-14 DIAGNOSIS — J4530 Mild persistent asthma, uncomplicated: Secondary | ICD-10-CM | POA: Diagnosis not present

## 2015-01-14 DIAGNOSIS — J3081 Allergic rhinitis due to animal (cat) (dog) hair and dander: Secondary | ICD-10-CM | POA: Diagnosis not present

## 2015-02-11 ENCOUNTER — Encounter: Payer: Self-pay | Admitting: Internal Medicine

## 2015-02-11 ENCOUNTER — Ambulatory Visit (INDEPENDENT_AMBULATORY_CARE_PROVIDER_SITE_OTHER): Payer: Medicare Other | Admitting: Internal Medicine

## 2015-02-11 VITALS — BP 124/78 | HR 101 | Temp 98.4°F | Ht 65.0 in | Wt 205.5 lb

## 2015-02-11 DIAGNOSIS — Z23 Encounter for immunization: Secondary | ICD-10-CM | POA: Diagnosis not present

## 2015-02-11 DIAGNOSIS — I6529 Occlusion and stenosis of unspecified carotid artery: Secondary | ICD-10-CM | POA: Diagnosis not present

## 2015-02-11 DIAGNOSIS — I10 Essential (primary) hypertension: Secondary | ICD-10-CM

## 2015-02-11 DIAGNOSIS — E119 Type 2 diabetes mellitus without complications: Secondary | ICD-10-CM | POA: Diagnosis not present

## 2015-02-11 DIAGNOSIS — Z09 Encounter for follow-up examination after completed treatment for conditions other than malignant neoplasm: Secondary | ICD-10-CM

## 2015-02-11 LAB — CBC WITH DIFFERENTIAL/PLATELET
BASOS PCT: 0.5 % (ref 0.0–3.0)
Basophils Absolute: 0 10*3/uL (ref 0.0–0.1)
Eosinophils Absolute: 0.3 10*3/uL (ref 0.0–0.7)
Eosinophils Relative: 3.3 % (ref 0.0–5.0)
HEMATOCRIT: 44.1 % (ref 36.0–46.0)
Hemoglobin: 14.7 g/dL (ref 12.0–15.0)
LYMPHS ABS: 3.5 10*3/uL (ref 0.7–4.0)
LYMPHS PCT: 36.1 % (ref 12.0–46.0)
MCHC: 33.3 g/dL (ref 30.0–36.0)
MCV: 84.8 fl (ref 78.0–100.0)
MONOS PCT: 9.4 % (ref 3.0–12.0)
Monocytes Absolute: 0.9 10*3/uL (ref 0.1–1.0)
NEUTROS ABS: 5 10*3/uL (ref 1.4–7.7)
NEUTROS PCT: 50.7 % (ref 43.0–77.0)
PLATELETS: 264 10*3/uL (ref 150.0–400.0)
RBC: 5.2 Mil/uL — ABNORMAL HIGH (ref 3.87–5.11)
RDW: 13.7 % (ref 11.5–15.5)
WBC: 9.8 10*3/uL (ref 4.0–10.5)

## 2015-02-11 LAB — LIPID PANEL
Cholesterol: 134 mg/dL (ref 0–200)
HDL: 34.1 mg/dL — ABNORMAL LOW (ref >39.00)
NonHDL: 99.8
Total CHOL/HDL Ratio: 4
Triglycerides: 209 mg/dL — ABNORMAL HIGH (ref 0.0–149.0)
VLDL: 41.8 mg/dL — AB (ref 0.0–40.0)

## 2015-02-11 LAB — LDL CHOLESTEROL, DIRECT: Direct LDL: 81 mg/dL

## 2015-02-11 LAB — HEMOGLOBIN A1C: HEMOGLOBIN A1C: 6.3 % (ref 4.6–6.5)

## 2015-02-11 NOTE — Assessment & Plan Note (Signed)
Diabetes: Started metformin 05-2014, A1c decreased from 7.1 to 6.2. Check A1c and cholesterol panel Hypertension: Continue losartan, Toprol, Cardizem and Lasix. Recent BMP satisfactory IBS: Complaining of a ill-defined left abdominal pain for 2 days, review of systems essentially negative, exam benign. Recommend observation, will call if symptoms increase Fibromyalgia, gout: To see Dr. Naida Sleight tomorrow

## 2015-02-11 NOTE — Progress Notes (Signed)
Pre visit review using our clinic review tool, if applicable. No additional management support is needed unless otherwise documented below in the visit note. 

## 2015-02-11 NOTE — Progress Notes (Signed)
Subjective:    Patient ID: [name], female    DOB: 29-Jun-1945, 69 y.o.   MRN: [id number]  DOS:  02/11/2015 Type of visit - description : Routine office visit Interval history: In the last couple of days, has noted left-sided upper abdominal pain with bending, feels like a pulling. No rash, fever chills, no nausea, vomiting, diarrhea or blood in the stools. No dysuria or gross hematuria  Diabetes, good compliance of medication, normal motor CBGs  Hypertension: Good compliance of medication, ambulatory BPs 130/80  Review of Systems See history of present illness GERD symptoms controlled on PPIs No recent problems with asthma  Past Medical History  Diagnosis Date  . Asthma   . Hypertension   . Hiatal hernia   . SUI (stress urinary incontinence, female)     (resolved after surgery)  . Atrophic vaginitis   . Fibromyalgia     Dr Amil Amen  . Shortness of breath     occ  . GERD (gastroesophageal reflux disease)   . DJD (degenerative joint disease)     generalized joint pain, including back  . TIA (transient ischemic attack)     07/2012, see Dr Leonie Man  . History of kidney stones   . Vertigo   . Gout   . Palpitations     PSVT on Holter monitoring  . FHx: BRCA2 gene positive     BRCA2 mutation in sister  . Family history of malignant neoplasm of breast     Past Surgical History  Procedure Laterality Date  . Abdominal hysterectomy  1994    TAH,BSO-BLADDER NECK SUSPENSION  . Surgery for poserior tibial tendon  2003  . Cystocele repair  1994  . Cholecystectomy, laparoscopic  2001  . Tubal ligation    . Carpal tunnel release Right 2005  . Triple fusion left ankle Left 2007  . Oophorectomy      BSO  . Breast surgery      LUMPECTOMY-BENIGN Breast Bx X2  . Tonsillectomy    . Knee arthroscopy      bil  . Knee arthroplasty  05/19/2012    Procedure: COMPUTER ASSISTED TOTAL KNEE ARTHROPLASTY;  Surgeon: Marybelle Killings, MD;  Location: [name];  Service: Orthopedics;   Laterality: Left;  Left  Total Knee Arthroplasty  . Knee arthroplasty Right 09/03/2013    Procedure: COMPUTER ASSISTED TOTAL KNEE ARTHROPLASTY;  Surgeon: Marybelle Killings, MD;  Location: [facility];  Service: Orthopedics;  Laterality: Right;  Right Total Knee Arthroplasty, Computer Assist  . Eye surgery Bilateral 2016    Cataract     Social History   Social History  . Marital Status: Married    Spouse Name: Kristy [name]  . Number of Children: 1  . Years of Education: 16   Occupational History  . 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 Smoking status: Never Smoker   . Smokeless tobacco: Never Used  . Alcohol Use: 0.0 oz/week    0 Standard drinks or equivalent per week     Comment: rare  . Drug Use: No  . Sexual Activity: No   Other Topics Concern  . 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 married '69. 2 sons - '70, '72: retired - [employer]. SO- good health.   ACP - OK for CPR at least once, no long term ventilation, no heroic measures in the face of poor quality of life. HCPOA - husband,  secondary son Kristy [name] (c) [phone]            Medication List       This list is accurate as of: 02/11/15  9:22 PM.  Always use your most recent med list.               aspirin 325 MG tablet  Take 1 tablet (325 mg total) by mouth daily.     ASTEPRO 0.15 % Soln  Generic drug:  Azelastine HCl  Place 2 sprays into the nose daily.     beclomethasone 80 MCG/ACT inhaler  Commonly known as:  QVAR  Inhale 1 puff into the lungs 2 (two) times daily.     diltiazem 240 MG 24 hr capsule  Commonly known as:  CARDIZEM CD  Take 1 capsule (240 mg total) by mouth daily.     EPIPEN 0.3 mg/0.3 mL Devi  Generic drug:  EPINEPHrine  Inject 0.3 mg into the muscle as needed (anaphylactic). For allergic reactions     fluticasone 50 MCG/ACT nasal spray  Commonly known as:  FLONASE  Place 2 sprays into both nostrils daily.     furosemide 40 MG tablet    Commonly known as:  LASIX  Take 1 tablet (40 mg total) by mouth daily.     HM VITAMIN D3 2000 UNITS Caps  Generic drug:  Cholecalciferol  Take 2,000 Units by mouth daily.     levalbuterol 45 MCG/ACT inhaler  Commonly known as:  XOPENEX HFA  Inhale 2 puffs into the lungs every 4 (four) hours as needed for shortness of breath.     levocetirizine 5 MG tablet  Commonly known as:  XYZAL  Take 5 mg by mouth daily.     losartan 50 MG tablet  Commonly known as:  COZAAR  Take 1 tablet (50 mg total) by mouth every morning.     metFORMIN 850 MG tablet  Commonly known as:  GLUCOPHAGE  Take 1 tablet (850 mg total) by mouth 2 (two) times daily with a meal.     metoprolol succinate 25 MG 24 hr tablet  Commonly known as:  TOPROL XL  Take 1.5 tablets (37.5 mg total) by mouth daily.     NONFORMULARY OR COMPOUNDED ITEM  Estradiol .02% 1 ML Prefilled Applicator Sig: apply vaginally twice a week #90 Day Supply with 4 refills     nortriptyline 25 MG capsule  Commonly known as:  PAMELOR  Take 25 mg by mouth at bedtime.     omeprazole 40 MG capsule  Commonly known as:  PRILOSEC  Take 1 capsule (40 mg total) by mouth daily.     PATADAY 0.2 % Soln  Generic drug:  Olopatadine HCl  Place 1 drop into both eyes daily.     vitamin E 400 UNIT capsule  Take 400 Units by mouth daily.           Objective:   Physical Exam BP 124/78 mmHg  Pulse 101  Temp(Src) 98.4 F (36.9 C) (Oral)  Ht '5\' 5"'  (1.651 m)  Wt 205 lb 8 oz (93.214 kg)  BMI 34.20 kg/m2  SpO2 95% General:   Well developed, well nourished . NAD.  HEENT:  Normocephalic . Face symmetric, atraumatic Lungs:  CTA B Normal respiratory effort, no intercostal retractions, no accessory muscle use. Heart: RRR,  no murmur.  no pretibial edema bilaterally  Abdomen:  Not distended, soft, no  TTP at the left upper quadrant or left chest wall. No mass  or organomegaly that I can tell. Skin: Not pale. Not jaundice Neurologic:  alert &  oriented X3.  Speech normal, gait appropriate for age and unassisted Psych--  Cognition and judgment appear intact.  Cooperative with normal attention span and concentration.  Behavior appropriate. No anxious or depressed appearing.    Assessment & Plan:   Problem list >  Diabetes Hypertension Asthma  GI: BERD, hiatal hernia, IBS TIA Vertigo (improved with vestibular rehabilitation 2015) Hearing loss, uses aids  MSK: --- Fibromyalgia, sees rheumatologist --DJD --Gout +FH breast cancer, sister braca2 + Kidney stones, h/o Urinary incontinence Atrophic vaginitis Hyperparathyroidism h/o slt increased PTH 2015 , normal PTH 05-2014   A/P Diabetes: Started metformin 05-2014, A1c decreased from 7.1 to 6.2. Check A1c and cholesterol panel Hypertension: Continue losartan, Toprol, Cardizem and Lasix. Recent BMP satisfactory IBS: Complaining of a ill-defined left abdominal pain for 2 days, review of systems essentially negative, exam benign. Recommend observation, will call if symptoms increase Fibromyalgia, gout: To see Dr. Naida Sleight tomorrow

## 2015-02-11 NOTE — Patient Instructions (Signed)
Get your blood work before you leave       Next visit  for a   complete physical exam by January 2017 Please schedule an appointment at the front desk Please come back fasting

## 2015-02-12 DIAGNOSIS — M150 Primary generalized (osteo)arthritis: Secondary | ICD-10-CM | POA: Diagnosis not present

## 2015-02-12 DIAGNOSIS — M797 Fibromyalgia: Secondary | ICD-10-CM | POA: Diagnosis not present

## 2015-02-12 DIAGNOSIS — M1A09X0 Idiopathic chronic gout, multiple sites, without tophus (tophi): Secondary | ICD-10-CM | POA: Diagnosis not present

## 2015-02-17 ENCOUNTER — Other Ambulatory Visit: Payer: Self-pay

## 2015-03-25 DIAGNOSIS — M797 Fibromyalgia: Secondary | ICD-10-CM | POA: Diagnosis not present

## 2015-03-25 DIAGNOSIS — M150 Primary generalized (osteo)arthritis: Secondary | ICD-10-CM | POA: Diagnosis not present

## 2015-03-25 DIAGNOSIS — M1A09X0 Idiopathic chronic gout, multiple sites, without tophus (tophi): Secondary | ICD-10-CM | POA: Diagnosis not present

## 2015-03-28 DIAGNOSIS — L821 Other seborrheic keratosis: Secondary | ICD-10-CM | POA: Diagnosis not present

## 2015-03-28 DIAGNOSIS — L820 Inflamed seborrheic keratosis: Secondary | ICD-10-CM | POA: Diagnosis not present

## 2015-04-15 ENCOUNTER — Ambulatory Visit (INDEPENDENT_AMBULATORY_CARE_PROVIDER_SITE_OTHER): Payer: Medicare Other | Admitting: Cardiovascular Disease

## 2015-04-15 ENCOUNTER — Encounter: Payer: Self-pay | Admitting: Cardiovascular Disease

## 2015-04-15 DIAGNOSIS — I10 Essential (primary) hypertension: Secondary | ICD-10-CM | POA: Diagnosis not present

## 2015-04-15 DIAGNOSIS — M150 Primary generalized (osteo)arthritis: Secondary | ICD-10-CM | POA: Diagnosis not present

## 2015-04-15 DIAGNOSIS — M797 Fibromyalgia: Secondary | ICD-10-CM | POA: Diagnosis not present

## 2015-04-15 DIAGNOSIS — M1A09X0 Idiopathic chronic gout, multiple sites, without tophus (tophi): Secondary | ICD-10-CM | POA: Diagnosis not present

## 2015-04-15 DIAGNOSIS — I6529 Occlusion and stenosis of unspecified carotid artery: Secondary | ICD-10-CM

## 2015-04-15 MED ORDER — METOPROLOL SUCCINATE ER 50 MG PO TB24: 50.0000 mg | ORAL_TABLET | Freq: Every day | ORAL | Status: DC

## 2015-04-15 NOTE — Progress Notes (Signed)
04/15/2015 [name]   07/11/1945  [id number]  Primary Physician Kristy November, MD Primary Cardiologist: Kristy Harp MD [name]   HPI:  K[facility]y [name] is a 69 year old moderately overweight married Caucasian female mother of 2 sons referred by [name] for evaluation of palpitations and PSVT. I last saw her in the office 04/16/14. Her cardiac risk factor profile is notable for treated hypertension. She's had a TIA 07/14/12 followed by [name]. She's never had a heart attack. She has normal LV function by 2-D echocardiogram last year. 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 She complained of some palpitations and a 48 hour Holter monitor showed sinus rhythm, sinus tachycardia, PACs and short runs of PSVT. I began her on low-dose oral beta blocker which resulted in marked improvement in her symptoms. Since I saw her a year ago she's done clinically well. She does have a few breakthrough episodes of the most part her symptoms have been controlled on a beta blocker.   Current Outpatient Prescriptions  Medication Sig Dispense Refill  . aspirin 325 MG tablet Take 1 tablet (325 mg total) by mouth daily.    . Azelastine HCl (ASTEPRO) 0.15 % SOLN Place 2 sprays into the nose daily.    . beclomethasone (QVAR) 80 MCG/ACT inhaler Inhale 1 puff into the lungs 2 (two) times daily.    . Cholecalciferol (HM VITAMIN D3) 2000 UNITS CAPS Take 2,000 Units by mouth daily.    . colchicine 0.6 MG tablet Take 1 tablet (0.6 mg total) by mouth daily.    Marland Kitchen diltiazem (CARDIZEM CD) 240 MG 24 hr capsule Take 1 capsule (240 mg total) by mouth daily. 30 capsule 6  . EPINEPHrine (EPIPEN) 0.3 mg/0.3 mL DEVI Inject 0.3 mg into the muscle as needed (anaphylactic). For allergic reactions    . fluticasone (FLONASE) 50 MCG/ACT nasal spray Place 2 sprays into both nostrils daily.    . furosemide (LASIX) 40 MG tablet Take 1 tablet (40 mg total) by mouth daily. 30 tablet 6  . levalbuterol  (XOPENEX HFA) 45 MCG/ACT inhaler Inhale 2 puffs into the lungs every 4 (four) hours as needed for shortness of breath.     . levocetirizine (XYZAL) 5 MG tablet Take 5 mg by mouth daily.    Marland Kitchen losartan (COZAAR) 50 MG tablet Take 1 tablet (50 mg total) by mouth every morning. 30 tablet 6  . metFORMIN (GLUCOPHAGE) 850 MG tablet Take 1 tablet (850 mg total) by mouth 2 (two) times daily with a meal. 60 tablet 6  . metoprolol succinate (TOPROL XL) 25 MG 24 hr tablet Take 1.5 tablets (37.5 mg total) by mouth daily. 45 tablet 6  . NONFORMULARY OR COMPOUNDED ITEM Estradiol .02% 1 ML Prefilled Applicator Sig: apply vaginally twice a week #90 Day Supply with 4 refills 1 each 4  . nortriptyline (PAMELOR) 25 MG capsule Take 25 mg by mouth at bedtime.      . Olopatadine HCl (PATADAY) 0.2 % SOLN Place 1 drop into both eyes daily.    Marland Kitchen omeprazole (PRILOSEC) 40 MG capsule Take 1 capsule (40 mg total) by mouth daily. 30 capsule 6  . vitamin E 400 UNIT capsule Take 400 Units by mouth daily.     No current facility-administered medications for this visit.    Allergies  Allergen Reactions  . Iodine Anaphylaxis  . Shellfish Allergy Anaphylaxis  . Sulfonamide Derivatives Rash  . Other Swelling    Citrus  Sometimes Lactose intolerant  . Amoxicillin Diarrhea    Social History   Social History  . Marital Status: Married    Spouse Name: Kristy [name]  . Number of Children: 1  . Years of Education: 16   Occupational History  . 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 Smoking status: Never Smoker   . Smokeless tobacco: Never Used  . Alcohol Use: 0.0 oz/week    0 Standard drinks or equivalent per week     Comment: rare  . Drug Use: No  . Sexual Activity: No   Other Topics Concern  . 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 married '69. 2 sons - '70, '72: retired - [employer]. SO- good health.   ACP - OK for CPR at least once, no long term ventilation, no  heroic measures in the face of poor quality of life. HCPOA - husband, secondary son [facility]y [name] (c) [phone]         Review of Systems: General: negative for chills, fever, night sweats or weight changes.  Cardiovascular: negative for chest pain, dyspnea on exertion, edema, orthopnea, palpitations, paroxysmal nocturnal dyspnea or shortness of breath Dermatological: negative for rash Respiratory: negative for cough or wheezing Urologic: negative for hematuria Abdominal: negative for nausea, vomiting, diarrhea, bright red blood per rectum, melena, or hematemesis Neurologic: negative for visual changes, syncope, or dizziness All other systems reviewed and are otherwise negative except as noted above.    Blood pressure 138/90, pulse 88, height 5\' 5"  (1.651 m), weight 205 lb (92.987 kg).  General appearance: alert and no distress Neck: no adenopathy, no carotid bruit, no JVD, supple, symmetrical, trachea midline and thyroid not enlarged, symmetric, no tenderness/mass/nodules Lungs: clear to auscultation bilaterally Heart: regular rate and rhythm, S1, S2 normal, no murmur, click, rub or gallop Extremities: extremities normal, atraumatic, no cyanosis or edema  EKG sinus rhythm at 88 without ST or T-wave changes. I personally reviewed his EKG  ASSESSMENT AND PLAN:   Tachycardia History of palpitations with event monitors that show PACs, PVCs and short runs of PSVT. The sodium improved with Cardizem and blocker.  Essential hypertension History of hypertension blood pressure measurements at 138/90. She is on Cardizem, metoprolol and losartan. Continue current meds at current dosing  Elevated LDL cholesterol level History of hyperlipidemia not on statin therapy followed by her PCP      Kristy Harp MD [facility], [facility] 04/15/2015 3:45 PM

## 2015-04-15 NOTE — Assessment & Plan Note (Signed)
History of palpitations with event monitors that show PACs, PVCs and short runs of PSVT. The sodium improved with Cardizem and blocker.

## 2015-04-15 NOTE — Patient Instructions (Signed)
Medication Instructions:  Your physician has recommended you make the following change in your medication:  1) INCREASE Toprol XL to 50 mg tablet (one tablet) by mouth ONCE daily    Labwork: none  Testing/Procedures: none  Follow-Up: Your physician wants you to follow-up in: 12 months with Dr. Gwenlyn Found. You will receive a reminder letter in the mail two months in advance. If you don't receive a letter, please call our office to schedule the follow-up appointment.   Any Other Special Instructions Will Be Listed Below (If Applicable).     If you need a refill on your cardiac medications before your next appointment, please call your pharmacy.

## 2015-04-15 NOTE — Assessment & Plan Note (Signed)
History of hypertension blood pressure measurements at 138/90. She is on Cardizem, metoprolol and losartan. Continue current meds at current dosing

## 2015-04-15 NOTE — Assessment & Plan Note (Signed)
History of hyperlipidemia not on statin therapy followed by her PCP 

## 2015-05-08 ENCOUNTER — Other Ambulatory Visit: Payer: Self-pay | Admitting: Internal Medicine

## 2015-05-12 DIAGNOSIS — M797 Fibromyalgia: Secondary | ICD-10-CM | POA: Diagnosis not present

## 2015-05-12 DIAGNOSIS — M1A09X0 Idiopathic chronic gout, multiple sites, without tophus (tophi): Secondary | ICD-10-CM | POA: Diagnosis not present

## 2015-05-12 DIAGNOSIS — M150 Primary generalized (osteo)arthritis: Secondary | ICD-10-CM | POA: Diagnosis not present

## 2015-06-03 DIAGNOSIS — R945 Abnormal results of liver function studies: Secondary | ICD-10-CM | POA: Diagnosis not present

## 2015-06-08 ENCOUNTER — Other Ambulatory Visit: Payer: Self-pay | Admitting: Internal Medicine

## 2015-06-10 ENCOUNTER — Encounter: Payer: Medicare Other | Admitting: Internal Medicine

## 2015-06-11 DIAGNOSIS — R945 Abnormal results of liver function studies: Secondary | ICD-10-CM | POA: Diagnosis not present

## 2015-06-24 DIAGNOSIS — M797 Fibromyalgia: Secondary | ICD-10-CM | POA: Diagnosis not present

## 2015-06-24 DIAGNOSIS — M150 Primary generalized (osteo)arthritis: Secondary | ICD-10-CM | POA: Diagnosis not present

## 2015-06-24 DIAGNOSIS — M1A09X0 Idiopathic chronic gout, multiple sites, without tophus (tophi): Secondary | ICD-10-CM | POA: Diagnosis not present

## 2015-06-24 DIAGNOSIS — R945 Abnormal results of liver function studies: Secondary | ICD-10-CM | POA: Diagnosis not present

## 2015-06-25 ENCOUNTER — Telehealth: Payer: Self-pay | Admitting: Behavioral Health

## 2015-06-25 ENCOUNTER — Encounter: Payer: Self-pay | Admitting: Behavioral Health

## 2015-06-25 NOTE — Telephone Encounter (Signed)
Pre-Visit Call completed with patient and chart updated.   Pre-Visit Info documented in Specialty Comments under SnapShot.    

## 2015-06-26 ENCOUNTER — Ambulatory Visit (INDEPENDENT_AMBULATORY_CARE_PROVIDER_SITE_OTHER): Payer: Medicare Other | Admitting: Internal Medicine

## 2015-06-26 ENCOUNTER — Encounter: Payer: Self-pay | Admitting: Internal Medicine

## 2015-06-26 VITALS — BP 126/78 | HR 100 | Temp 97.7°F | Ht 65.0 in | Wt 196.5 lb

## 2015-06-26 DIAGNOSIS — I10 Essential (primary) hypertension: Secondary | ICD-10-CM | POA: Diagnosis not present

## 2015-06-26 DIAGNOSIS — E1149 Type 2 diabetes mellitus with other diabetic neurological complication: Secondary | ICD-10-CM

## 2015-06-26 DIAGNOSIS — Z0000 Encounter for general adult medical examination without abnormal findings: Secondary | ICD-10-CM

## 2015-06-26 DIAGNOSIS — E785 Hyperlipidemia, unspecified: Secondary | ICD-10-CM

## 2015-06-26 DIAGNOSIS — E119 Type 2 diabetes mellitus without complications: Secondary | ICD-10-CM

## 2015-06-26 LAB — BASIC METABOLIC PANEL
BUN: 18 mg/dL (ref 6–23)
CALCIUM: 9.7 mg/dL (ref 8.4–10.5)
CO2: 25 meq/L (ref 19–32)
CREATININE: 0.8 mg/dL (ref 0.40–1.20)
Chloride: 98 mEq/L (ref 96–112)
GFR: 75.24 mL/min (ref >60.00)
GLUCOSE: 129 mg/dL — AB (ref 70–99)
Potassium: 3.2 mEq/L — ABNORMAL LOW (ref 3.5–5.1)
SODIUM: 136 meq/L (ref 135–145)

## 2015-06-26 LAB — LIPID PANEL
CHOL/HDL RATIO: 5
Cholesterol: 151 mg/dL (ref 0–200)
HDL: 33.3 mg/dL — ABNORMAL LOW (ref >39.00)
NONHDL: 117.28
Triglycerides: 213 mg/dL — ABNORMAL HIGH (ref 0.0–149.0)
VLDL: 42.6 mg/dL — ABNORMAL HIGH (ref 0.0–40.0)

## 2015-06-26 LAB — HEMOGLOBIN A1C: Hgb A1c MFr Bld: 6.4 % (ref 4.6–6.5)

## 2015-06-26 LAB — LDL CHOLESTEROL, DIRECT: LDL DIRECT: 92 mg/dL

## 2015-06-26 NOTE — Progress Notes (Signed)
Pre visit review using our clinic review tool, if applicable. No additional management support is needed unless otherwise documented below in the visit note. 

## 2015-06-26 NOTE — Assessment & Plan Note (Addendum)
Td 2012;  Pneumonia shot 2007; Prevnar  2015; had a flu shot   Female care per Dr. Toney Rakes, last mammogram 05-2014 negative, plannig to schedule f/u mmg soon. Had a colonoscopy in 2012, was recommended a 5 year follow-up. + FH  of breast cancer, and the patient is BRCA negative + Family history of CAD Diet and exercise discussed

## 2015-06-26 NOTE — Patient Instructions (Signed)
BEFORE YOU LEAVE THE OFFICE: GO TO THE LAB  Get the blood work    GO TO THE FRONT DESK Schedule labs to be done within few days (fasting)  Schedule a routine office visit or check up to be done in  6-8 months  Please be fasting        Fall Prevention and [place] cause injuries and can affect all age groups. It is possible to use preventive measures to significantly decrease the likelihood of falls. There are many simple measures which can make your home safer and prevent falls. OUTDOORS  Repair cracks and edges of walkways and driveways.  Remove high doorway thresholds.  Trim shrubbery on the main path into your home.  Have good outside lighting.  Clear walkways of tools, rocks, debris, and clutter.  Check that handrails are not broken and are securely fastened. Both sides of steps should have handrails.  Have leaves, snow, and ice cleared regularly.  Use sand or salt on walkways during winter months.  In the garage, clean up grease or oil spills. BATHROOM  Install night lights.  Install grab bars by the toilet and in the tub and shower.  Use non-skid mats or decals in the tub or shower.  Place a plastic non-slip stool in the shower to sit on, if needed.  Keep floors dry and clean up all water on the floor immediately.  Remove soap buildup in the tub or shower on a regular basis.  Secure bath mats with non-slip, double-sided rug tape.  Remove throw rugs and tripping hazards from the floors. BEDROOMS  Install night lights.  Make sure a bedside light is easy to reach.  Do not use oversized bedding.  Keep a telephone by your bedside.  Have a firm chair with side arms to use for getting dressed.  Remove throw rugs and tripping hazards from the floor. KITCHEN  Keep handles on pots and pans turned toward the center of the stove. Use back burners when possible.  Clean up spills quickly and allow time for drying.  Avoid walking on wet  floors.  Avoid hot utensils and knives.  Position shelves so they are not too high or low.  Place commonly used objects within easy reach.  If necessary, use a sturdy step stool with a grab bar when reaching.  Keep electrical cables out of the way.  Do not use floor polish or wax that makes floors slippery. If you must use wax, use non-skid floor wax.  Remove throw rugs and tripping hazards from the floor. STAIRWAYS  Never leave objects on stairs.  Place handrails on both sides of stairways and use them. Fix any loose handrails. Make sure handrails on both sides of the stairways are as long as the stairs.  Check carpeting to make sure it is firmly attached along stairs. Make repairs to worn or loose carpet promptly.  Avoid placing throw rugs at the top or bottom of stairways, or properly secure the rug with carpet tape to prevent slippage. Get rid of throw rugs, if possible.  Have an electrician put in a light switch at the top and bottom of the stairs. OTHER FALL PREVENTION TIPS  Wear low-heel or rubber-soled shoes that are supportive and fit well. Wear closed toe shoes.  When using a stepladder, make sure it is fully opened and both spreaders are firmly locked. Do not climb a closed stepladder.  Add color or contrast paint or tape to grab bars and handrails in  your home. Place contrasting color strips on first and last steps.  Learn and use mobility aids as needed. Install an electrical emergency response system.  Turn on lights to avoid dark areas. Replace light bulbs that burn out immediately. Get light switches that glow.  Arrange furniture to create clear pathways. Keep furniture in the same place.  Firmly attach carpet with non-skid or double-sided tape.  Eliminate uneven floor surfaces.  Select a carpet pattern that does not visually hide the edge of steps.  Be aware of all pets. OTHER HOME SAFETY TIPS  Set the water temperature for 120 F (48.8 C).  Keep  emergency numbers on or near the telephone.  Keep smoke detectors on every level of the home and near sleeping areas. Document Released: 05/07/2002 Document Revised: 11/16/2011 Document Reviewed: 08/06/2011 [facility] Patient Information 2015 [place], [place]. This information is not intended to replace advice given to you by your health care provider. Make sure you discuss any questions you have with your health care provider.   Preventive Care for Adults Ages 61 and over  Blood pressure check.** / Every 1 to 2 years.  Lipid and cholesterol check.**/ Every 5 years beginning at age 16.  Lung cancer screening. / Every year if you are aged 52-80 years and have a 30-pack-year history of smoking and currently smoke or have quit within the past 15 years. Yearly screening is stopped once you have quit smoking for at least 15 years or develop a health problem that would prevent you from having lung cancer treatment.  Fecal occult blood test (FOBT) of stool. / Every year beginning at age 74 and continuing until age 61. You may not have to do this test if you get a colonoscopy every 10 years.  Flexible sigmoidoscopy** or colonoscopy.** / Every 5 years for a flexible sigmoidoscopy or every 10 years for a colonoscopy beginning at age 87 and continuing until age 3.  Hepatitis C blood test.** / For all people born from [birth year] through 1966 and any individual with known risks for hepatitis C.  Abdominal aortic aneurysm (AAA) screening.** / A one-time screening for ages 76 to 15 years who are current or former smokers.  Skin self-exam. / Monthly.  Influenza vaccine. / Every year.  Tetanus, diphtheria, and acellular pertussis (Tdap/Td) vaccine.** / 1 dose of Td every 10 years.  Varicella vaccine.** / Consult your health care provider.  Zoster vaccine.** / 1 dose for adults aged 27 years or older.  Pneumococcal 13-valent conjugate (PCV13) vaccine.** / Consult your health care provider.  Pneumococcal  polysaccharide (PPSV23) vaccine.** / 1 dose for all adults aged 65 years and older.  Meningococcal vaccine.** / Consult your health care provider.  Hepatitis A vaccine.** / Consult your health care provider.  Hepatitis B vaccine.** / Consult your health care provider.  Haemophilus influenzae type b (Hib) vaccine.** / Consult your health care provider. **Family history and personal history of risk and conditions may change your health care provider's recommendations. Document Released: 07/13/2001 Document Revised: 05/22/2013 Document Reviewed: 10/12/2010 [facility] Patient Information 2015 [place], [place]. This information is not intended to replace advice given to you by your health care provider. Make sure you discuss any questions you have with your health care provider.

## 2015-06-26 NOTE — Progress Notes (Signed)
Subjective:    Patient ID: Kristy [name], female    DOB: June 02, 1945, 70 y.o.   MRN: [id number]  DOS:  06/26/2015 Type of visit - description :   Here for Medicare AWV:  1. Risk factors based on Past M, S, F history: reviewed 2. Physical Activities: sedentary life style 3. Depression/mood: neg screening   4. Hearing:  Has hearing aids  5. ADL's:  Independent , drives   6. Fall Risk: has h/o vertigo, s/p vestibular rehab, better, no recent falls 7. home Safety: does feel safe at home   8. Height, weight, & visual acuity: see VS, sees eye doctor regulalrly, s/p B cataracts April 2016  9. Counseling: provided 10. Labs ordered based on risk factors: if needed   11. Referral Coordination: if needed 12. Care Plan, see assessment and plan   13. Cognitive Assessment: cognition-motor skills appropriate for age   52. Care team updated 15. Personalized plan provided,  see instructions 16. Has a HC-POA  In addition, today we discussed the following:   HTN: amb  BPs normal when checked. DM: Good compliance with metformin, not ambulatory CBGs Gout: Recently had a flareup, has seen rheumatology, change from allopurinol to [employer] elevated LFTs. Increase LFTs: Noted by rheumatology, they are managing the  issue. Asthma: Rarely uses Xopenex.  Review of Systems Chronic medical problems causing symptoms on and off but nothing new or severe.See below  Constitutional: No fever. No chills. No unexplained wt changes. No unusual sweats  HEENT: No dental problems, no ear discharge, no facial swelling, no voice changes. No eye discharge, no eye  redness , no  intolerance to light   Respiratory: No wheezing , no  difficulty breathing. No cough , no mucus production  Cardiovascular: No CP, no leg swelling , no  Palpitations  GI: IBS recently exacerbated, symptoms decreased this week..  No blood in the stools. No dysphagia, no odynophagia    Endocrine: No polyphagia, no polyuria , no  polydipsia  GU: No dysuria, gross hematuria, difficulty urinating. No urinary urgency, no frequency.  Musculoskeletal: No joint swellings or unusual aches or pains  Skin: No change in the color of the skin, palor , no  Rash  Allergic, immunologic: No environmental allergies , no  food allergies  Neurological: No dizziness no  syncope. No headaches. No diplopia, no slurred, no slurred speech, no motor deficits, no facial  Numbness  Hematological: No enlarged lymph nodes, no easy bruising , no unusual bleedings  Psychiatry: No suicidal ideas, no hallucinations, no beavior problems, no confusion.  No unusual/severe anxiety, no depression   Past Medical History  Diagnosis Date  . Asthma   . Hypertension   . Hiatal hernia   . SUI (stress urinary incontinence, female)     (resolved after surgery)  . Atrophic vaginitis   . Fibromyalgia     Dr Amil Amen  . Shortness of breath     occ  . GERD (gastroesophageal reflux disease)   . DJD (degenerative joint disease)     generalized joint pain, including back  . TIA (transient ischemic attack)     07/2012, see Dr Leonie Man  . History of kidney stones   . Vertigo   . Gout     Idiopathic, chronic  . Palpitations     PSVT on Holter monitoring  . FHx: BRCA2 gene positive     BRCA2 mutation in sister  . Family history of malignant neoplasm of breast     Past  Surgical History  Procedure Laterality Date  . Abdominal hysterectomy  1994    TAH,BSO-BLADDER NECK SUSPENSION  . Surgery for poserior tibial tendon  2003  . Cystocele repair  1994  . Cholecystectomy, laparoscopic  2001  . Tubal ligation    . Carpal tunnel release Right 2005  . Triple fusion left ankle Left 2007  . Oophorectomy      BSO  . Breast surgery      LUMPECTOMY-BENIGN Breast Bx X2  . Tonsillectomy    . Knee arthroscopy      bil  . Knee arthroplasty  05/19/2012    Procedure: COMPUTER ASSISTED TOTAL KNEE ARTHROPLASTY;  Surgeon: Marybelle Killings, MD;  Location: [facility];   Service: Orthopedics;  Laterality: Left;  Left  Total Knee Arthroplasty  . Knee arthroplasty Right 09/03/2013    Procedure: COMPUTER ASSISTED TOTAL KNEE ARTHROPLASTY;  Surgeon: Marybelle Killings, MD;  Location: [facility];  Service: Orthopedics;  Laterality: Right;  Right Total Knee Arthroplasty, Computer Assist  . Eye surgery Bilateral 2016    Cataract     Social History   Social History  . Marital Status: Married    Spouse Name: [name]  . Number of Children: 2  . Years of Education: 16   Occupational History  . 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 Smoking status: Never Smoker   . Smokeless tobacco: Never Used  . Alcohol Use: 0.0 oz/week    0 Standard drinks or equivalent per week     Comment: rare  . Drug Use: No  . Sexual Activity: No   Other Topics Concern  . 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 married '69. 2 sons - '70, '72: retired - [employer]. SO- good health.   ACP - OK for CPR at least once, no long term ventilation, no heroic measures in the face of poor quality of life. HCPOA - husband, secondary son Audree Schrecengost (c) [phone]       Family History  Problem Relation Age of Onset  . Breast cancer Mother 24    deceased 109  . Breast cancer Sister 60    bilateral breast ca @ 26; BRCA2 positive  . Hypertension Sister   . Cancer Father     Brain tumor; path?; deceased 71  . Lymphoma Maternal Aunt     deceased 25s  . Hemochromatosis Sister   . Thalassemia Sister   . Other Sister     negative for BRCA1 BRCA2  . Breast cancer Cousin     BRCA2 positive  . Diabetes Other   . Breast cancer Other     distant maternal female relatives with breast cancer  . Colon cancer Neg Hx   . CAD Son     MI  . Heart failure Sister     due to chemo?        Medication List       This list is accurate as of: 06/26/15  5:21 PM.  Always use your most recent med list.               aspirin 325 MG tablet  Take 1 tablet (325  mg total) by mouth daily.     ASTEPRO 0.15 % Soln  Generic drug:  Azelastine HCl  Place 2 sprays into the nose daily.     beclomethasone 80 MCG/ACT inhaler  Commonly known as:  QVAR  Inhale  1 puff into the lungs 2 (two) times daily.     diltiazem 240 MG 24 hr capsule  Commonly known as:  CARDIZEM CD  Take 1 capsule (240 mg total) by mouth daily.     EPIPEN 0.3 mg/0.3 mL Devi  Generic drug:  EPINEPHrine  Inject 0.3 mg into the muscle as needed (anaphylactic). Reported on 06/26/2015     febuxostat 40 MG tablet  Commonly known as:  ULORIC  Take 40 mg by mouth daily.     fluticasone 50 MCG/ACT nasal spray  Commonly known as:  FLONASE  Place 2 sprays into both nostrils daily.     furosemide 40 MG tablet  Commonly known as:  LASIX  Take 1 tablet (40 mg total) by mouth daily.     HM VITAMIN D3 2000 units Caps  Generic drug:  Cholecalciferol  Take 2,000 Units by mouth daily.     levalbuterol 45 MCG/ACT inhaler  Commonly known as:  XOPENEX HFA  Inhale 2 puffs into the lungs every 4 (four) hours as needed for shortness of breath.     levocetirizine 5 MG tablet  Commonly known as:  XYZAL  Take 5 mg by mouth daily.     losartan 50 MG tablet  Commonly known as:  COZAAR  Take 1 tablet (50 mg total) by mouth every morning.     metFORMIN 850 MG tablet  Commonly known as:  GLUCOPHAGE  Take 1 tablet (850 mg total) by mouth 2 (two) times daily with a meal.     metoprolol succinate 50 MG 24 hr tablet  Commonly known as:  TOPROL-XL  Take 50 mg by mouth daily. Take with or immediately following a meal.     NONFORMULARY OR COMPOUNDED ITEM  Estradiol .02% 1 ML Prefilled Applicator Sig: apply vaginally twice a week #90 Day Supply with 4 refills     nortriptyline 25 MG capsule  Commonly known as:  PAMELOR  Take 25 mg by mouth at bedtime.     omeprazole 40 MG capsule  Commonly known as:  PRILOSEC  Take 1 capsule (40 mg total) by mouth daily.     PATADAY 0.2 % Soln  Generic drug:   Olopatadine HCl  Place 1 drop into both eyes daily.     vitamin E 400 UNIT capsule  Take 400 Units by mouth daily.           Objective:   Physical Exam BP 126/78 mmHg  Pulse 100  Temp(Src) 97.7 F (36.5 C) (Oral)  Ht _0  (1.651 m)  Wt 196 lb 8 oz (89.132 kg)  BMI 32.70 kg/m2  SpO2 98% General:   Well developed, well nourished . NAD.  HEENT:  Normocephalic . Face symmetric, atraumatic. Neck: No thyromegaly Lungs:  CTA B Normal respiratory effort, no intercostal retractions, no accessory muscle use. Heart: Slightly tachycardic,  no murmur.  Trace pretibial edema bilaterally  Abdomen:  Not distended, soft, non-tender. No rebound or rigidity. No mass,organomegaly Skin: Not pale. Not jaundice Neurologic:  alert & oriented X3.  Speech normal, gait appropriate for age and unassisted Psych--  Cognition and judgment appear intact.  Cooperative with normal attention span and concentration.  Behavior appropriate. No anxious or depressed appearing.    Assessment & Plan:   Assessment DM w/ h/o TIA HTN  Hyperlipidemia  Insomnia  Asthma , allergies-- sees Dr Harold Hedge  GERD, hiatal hernia , h/o IBS Obesity  Palpitations: PSVT by Holter TIA, dx 2014, see Dr. Leonie Man  Vertigo (improved with vestibular rehabilitation 2015) MSK: --Gout --DJD --Fibromyalgia, Dr. Amil Amen --Osteopenia  FH breast cancer , sister is BRCA2 (+), pt is  (-) GU: Kidney stones, h/o; Urinary incontinence; Atrophic vaginitis Hyperparathyroidism h/o slt increased PTH 2015 , normal PTH 05-2014  HOH- aids   PLAN: DM: On metformin, check the A1c HTN: Well-controlled. Check BMP Hyperlipidemia: Mild, diet controlled, recheck FLP Increase LFTs: Follow-up by dermatology RTC 6-8 months

## 2015-07-07 ENCOUNTER — Encounter: Payer: Self-pay | Admitting: Gastroenterology

## 2015-07-21 DIAGNOSIS — J3089 Other allergic rhinitis: Secondary | ICD-10-CM | POA: Diagnosis not present

## 2015-07-21 DIAGNOSIS — J301 Allergic rhinitis due to pollen: Secondary | ICD-10-CM | POA: Diagnosis not present

## 2015-07-21 DIAGNOSIS — J4530 Mild persistent asthma, uncomplicated: Secondary | ICD-10-CM | POA: Diagnosis not present

## 2015-07-21 DIAGNOSIS — J3081 Allergic rhinitis due to animal (cat) (dog) hair and dander: Secondary | ICD-10-CM | POA: Diagnosis not present

## 2015-07-23 DIAGNOSIS — M1A09X0 Idiopathic chronic gout, multiple sites, without tophus (tophi): Secondary | ICD-10-CM | POA: Diagnosis not present

## 2015-07-31 ENCOUNTER — Encounter: Payer: Self-pay | Admitting: Neurology

## 2015-07-31 ENCOUNTER — Ambulatory Visit (INDEPENDENT_AMBULATORY_CARE_PROVIDER_SITE_OTHER): Payer: Medicare Other | Admitting: Neurology

## 2015-07-31 VITALS — BP 137/90 | HR 90 | Ht 65.0 in | Wt 202.4 lb

## 2015-07-31 DIAGNOSIS — R42 Dizziness and giddiness: Secondary | ICD-10-CM | POA: Insufficient documentation

## 2015-07-31 NOTE — Patient Instructions (Signed)
I had a long discussion with the patient with regards to her remote TIA and recommend she continue aspirin for stroke prevention and maintain strict control of hypertension with blood pressure goal below 130/90, diabetes with hemoglobin A1c goal below 6.5% and lipids with LDL cholesterol goal below 70 mg percent. I also advised her to do orthostatic tolerance exercises for her dizziness which is likely multifactorial. Check screening carotid ultrasound study. Since patient has been free from neurovascular symptoms for more than 3 years I do not believe further routine neurological follow-up is necessary. She will continue follow-up with her primary physician and can be referred back in the future if necessary.

## 2015-07-31 NOTE — Progress Notes (Signed)
PATIENT: Kristy [name] DOB: 11-Apr-1946  REASON FOR VISIT: routine follow up for TIA HISTORY FROM: patient  HISTORY OF PRESENT ILLNESS: 70 year old Caucasian lady seen for her first office followup visit following hospital admission for TIA. She was admitted on 07/14/12 with sudden onset of numbness involving the left face and slurred speech following a brief occipital headache. Her headache and speech symptoms resolved but left facial numbness persisted. NIH stroke scale on admission was 0. CT scan was unremarkable. MRI scan did not show any acute infarct. MRA of the brain showed no large vessel stenosis. Lipid profile was normal hemoglobin A1c was borderline at 6.4%. Transthoracic echo showed normal ejection fraction without cardiac source of embolism. MRA of the neck showed only mild plaque without hemodynamically significant stenosis. She was started on aspirin for stroke prevention. She denied history of migraine headaches. She states she has done well since discharge; her headache and speech problems have completely resolved. She states her blood pressure has been under good control and it is 128/7 in office today. She has no new complaints. She plans to exercise regularly and start going to a swimming pool.   UPDATE 03/01/13 (LL): Kristy [name] comes in for TIA followup. She has had no recurrent TIA symptoms, but occasionally gets a brief sharp pain in her head. She has an area on her left cheek that she says has decreased sensation since the TIA. She has difficulty with dizziness and motion sickness, and sometimes falls for no reason. She sometimes uses a cane to help with balance. She denies weakness in her legs but has had total knee replacement on the left and needs the right side done, plans are for next month. She states her left ankle has 4 screws in it as well. She states her blood pressure is well controlled, is 142/83 in office today.   UPDATE 11/28/13 (LL):  Since last visit, patient had  right total knee replacement, without complication.  Since surgery she has been much more mobile and can walk much longer distances.  She still feels somewhat off-balance at times. She has had no recurrent neurovascular symptoms.  Blood pressure is slightly elevated today at 151/86.  She is tolerating aspirin well with no signs of significant bleeding or bruising.  She has had more palpitations lately and wore a holter monitor 48 hours last week but does not know the results.  Her HR is 109 in the office today, sitting down.  Otherwise, she feels well. Update 07/31/2014 ( PS) : She returns for follow-up after last visit 6 months ago. She continues to do well from neurovascular standpoint without recurrent stroke or TIA symptoms. She is tolerating aspirin well without significant bleeding or bruising. She states her blood pressure is usually in the 120s to 130s. She does however complain of some side effects from of tiredness, blood pressure medicines. She has not been able to exercise regularly and has not lost any weight. She complains of her right ear being blocked and not hearing well despite hearing aids and having a feeling of pressure behind the ears. She wonders whether this could be related to allergies and I advised her to see her primary care physician to evaluate this further. She has no new complaints today. She has not been regularly checking her fasting sugar. Her last hemoglobin A1c was 7.1 checked last month. Update 07/31/15 : She returns for follow-up after last visit a year ago. She continues to do well from neurovascular standpoint without recurrent  TIA or stroke symptoms now for 3 years. She had a couple of occasions of brief and headaches which responded well to aspirin. She also complains of occasional transient dizziness and feeling of off balance. This may occur after she has been sitting or lying down but occasionally may occur even when she is walking. She denies any tingling numbness pain or  burning in her feet. She feels her diabetes is well controlled though she cannot tell me when the last A1c was checked. Her blood pressure is well controlled and today it is 137/90. She is tolerating aspirin well without bleeding or bruising. She does see a primary care physician regularly.  ROS:  14 system review of systems is positive for  dizziness, eye itching, insomnia, food and environmental allergies and all other systems negative.   ALLERGIES: Allergies  Allergen Reactions  . Iodine Anaphylaxis  . Shellfish Allergy Anaphylaxis  . Sulfonamide Derivatives Rash  . Other Swelling    Citrus  Sometimes Lactose intolerant  . Amoxicillin Diarrhea    HOME MEDICATIONS: Outpatient Prescriptions Prior to Visit  Medication Sig Dispense Refill  . aspirin 325 MG tablet Take 1 tablet (325 mg total) by mouth daily.    . Azelastine HCl (ASTEPRO) 0.15 % SOLN Place 2 sprays into the nose daily.    . beclomethasone (QVAR) 80 MCG/ACT inhaler Inhale 1 puff into the lungs 2 (two) times daily.    . Cholecalciferol (HM VITAMIN D3) 2000 UNITS CAPS Take 2,000 Units by mouth daily.    Marland Kitchen diltiazem (CARDIZEM CD) 240 MG 24 hr capsule Take 1 capsule (240 mg total) by mouth daily. 30 capsule 1  . EPINEPHrine (EPIPEN) 0.3 mg/0.3 mL DEVI Inject 0.3 mg into the muscle as needed (anaphylactic). Reported on 06/26/2015    . febuxostat (ULORIC) 40 MG tablet Take 40 mg by mouth daily.    . fluticasone (FLONASE) 50 MCG/ACT nasal spray Place 2 sprays into both nostrils daily.    . furosemide (LASIX) 40 MG tablet Take 1 tablet (40 mg total) by mouth daily. 30 tablet 6  . levalbuterol (XOPENEX HFA) 45 MCG/ACT inhaler Inhale 2 puffs into the lungs every 4 (four) hours as needed for shortness of breath.     . levocetirizine (XYZAL) 5 MG tablet Take 5 mg by mouth daily.    Marland Kitchen losartan (COZAAR) 50 MG tablet Take 1 tablet (50 mg total) by mouth every morning. 30 tablet 6  . metFORMIN (GLUCOPHAGE) 850 MG tablet Take 1 tablet (850  mg total) by mouth 2 (two) times daily with a meal. 60 tablet 6  . metoprolol succinate (TOPROL-XL) 50 MG 24 hr tablet Take 50 mg by mouth daily. Take with or immediately following a meal.    . NONFORMULARY OR COMPOUNDED ITEM Estradiol .02% 1 ML Prefilled Applicator Sig: apply vaginally twice a week #90 Day Supply with 4 refills 1 each 4  . nortriptyline (PAMELOR) 25 MG capsule Take 25 mg by mouth at bedtime.      . Olopatadine HCl (PATADAY) 0.2 % SOLN Place 1 drop into both eyes daily.    Marland Kitchen omeprazole (PRILOSEC) 40 MG capsule Take 1 capsule (40 mg total) by mouth daily. 30 capsule 6  . vitamin E 400 UNIT capsule Take 400 Units by mouth daily.     No facility-administered medications prior to visit.    PHYSICAL EXAM Filed Vitals:   07/31/15 1001  BP: 137/90  Pulse: 90  Height: 5\' 5"  (1.651 m)  Weight: 202 lb 6.4  oz (91.808 kg)   Body mass index is 33.68 kg/(m^2).  General: Obese middle-aged Caucasian lady, seated, in no evident distress  Head: head normocephalic and atraumatic.   Neck: supple with no carotid or supraclavicular bruits  Cardiovascular: regular rate and rhythm, no murmurs  Musculoskeletal: no deformity  Skin: no rash/petichiae  Vascular: Normal pulses all extremities  Neurologic Exam  Mental Status: Awake and fully alert. Oriented to place and time. Recent and remote memory intact. Attention span, concentration and fund of knowledge appropriate. Mood and affect appropriate.  Cranial Nerves: Pupils equal, briskly reactive to light. Extraocular movements full without nystagmus. Visual fields full to confrontation. Hearing intact. Facial sensation intact. Face, tongue, palate moves normally and symmetrically.  Motor: Normal bulk and tone. Normal strength in all tested extremity muscles.  Sensory.: intact to tough and pinprick and vibratory.  Coordination: Rapid alternating movements normal in all extremities. Finger-to-nose and heel-to-shin performed accurately  bilaterally.  Gait and Station: Arises from chair without difficulty. Stance is normal. Gait demonstrates normal stride length and balance . Unable to heel, toe and tandem walk without difficulty.  Reflexes: 1+ and symmetric. Toes downgoing.   MRI HEAD WITHOUT AND WITH CONTRAST 07/15/12  Mild atrophy. Mild to moderate chronic microvascular ischemic change. No acute stroke. No abnormal enhancement.  MRA HEAD WITHOUT CONTRAST 07/15/12  Unremarkable MR angiography intracranial circulation.  MRA NECK WITHOUT AND WITH CONTRAST 07/15/12  Mild posterior wall plaque right internal carotid artery origin, without flow reducing stenosis or dissection. Otherwise unremarkable study.   ASSESSMENT AND PLAN  56 year lady with a right brain TIA in February 2014 likely due to small vessel disease. Vascular risk factors of hypertension, age and sex only. Doing well, with no recurrent symptoms.  PLAN:    I had a long discussion with the patient with regards to her remote TIA and recommend she continue aspirin for stroke prevention and maintain strict control of hypertension with blood pressure goal below 130/90, diabetes with hemoglobin A1c goal below 6.5% and lipids with LDL cholesterol goal below 70 mg percent. I also advised her to do orthostatic tolerance exercises for her dizziness which is likely multifactorial. Check screening carotid ultrasound study. Since patient has been free from neurovascular symptoms for more than 3 years I do not believe further routine neurological follow-up is necessary. She will continue follow-up with her primary physician and can be referred back in the future if necessary.  Return if symptoms worsen or fail to improve.  Antony Contras, MD  07/31/2015, 10:51 AM [facility] [address] [place], [place] [address] [phone]  Note: This document was prepared with digital dictation and possible smart phrase technology. Any transcriptional errors that  result from this process are unintentional.

## 2015-08-07 ENCOUNTER — Other Ambulatory Visit: Payer: Self-pay | Admitting: Internal Medicine

## 2015-08-07 ENCOUNTER — Ambulatory Visit (INDEPENDENT_AMBULATORY_CARE_PROVIDER_SITE_OTHER): Payer: Medicare Other

## 2015-08-07 DIAGNOSIS — R42 Dizziness and giddiness: Secondary | ICD-10-CM

## 2015-08-26 ENCOUNTER — Other Ambulatory Visit: Payer: Self-pay | Admitting: Internal Medicine

## 2015-10-21 DIAGNOSIS — M797 Fibromyalgia: Secondary | ICD-10-CM | POA: Diagnosis not present

## 2015-10-21 DIAGNOSIS — R945 Abnormal results of liver function studies: Secondary | ICD-10-CM | POA: Diagnosis not present

## 2015-10-21 DIAGNOSIS — M1A09X0 Idiopathic chronic gout, multiple sites, without tophus (tophi): Secondary | ICD-10-CM | POA: Diagnosis not present

## 2015-10-21 DIAGNOSIS — M150 Primary generalized (osteo)arthritis: Secondary | ICD-10-CM | POA: Diagnosis not present

## 2015-10-21 DIAGNOSIS — M65321 Trigger finger, right index finger: Secondary | ICD-10-CM | POA: Diagnosis not present

## 2015-10-22 LAB — HEPATIC FUNCTION PANEL
ALK PHOS: 80 U/L (ref 25–125)
ALT: 68 U/L — AB (ref 7–35)
AST: 47 U/L — AB (ref 13–35)
Bilirubin, Total: 0.7 mg/dL

## 2015-10-22 LAB — BASIC METABOLIC PANEL
BUN: 18 mg/dL (ref 4–21)
CREATININE: 1.1 mg/dL (ref 0.5–1.1)
GLUCOSE: 131 mg/dL
Potassium: 4.4 mmol/L (ref 3.4–5.3)
Sodium: 140 mmol/L (ref 137–147)

## 2015-10-22 LAB — CBC AND DIFFERENTIAL
HCT: 42 % (ref 36–46)
Hemoglobin: 14 g/dL (ref 12.0–16.0)
Neutrophils Absolute: 4 /uL
PLATELETS: 268 10*3/uL (ref 150–399)
WBC: 9.1 10^3/mL

## 2015-11-05 ENCOUNTER — Encounter: Payer: Self-pay | Admitting: Gastroenterology

## 2015-11-13 ENCOUNTER — Ambulatory Visit (AMBULATORY_SURGERY_CENTER): Payer: Self-pay

## 2015-11-13 ENCOUNTER — Telehealth: Payer: Self-pay

## 2015-11-13 VITALS — Ht 65.0 in | Wt 207.8 lb

## 2015-11-13 DIAGNOSIS — Z860100 Personal history of colon polyps, unspecified: Secondary | ICD-10-CM

## 2015-11-13 NOTE — Telephone Encounter (Signed)
Yes, we can proceed, as long as she has somebody at home to assist her if necessary during bowel preparation.

## 2015-11-13 NOTE — Progress Notes (Signed)
No allergies to eggs or soy No past problems with anesthesia No home oxygen No diet meds  Has email and internet; registered for emmi 

## 2015-11-13 NOTE — Telephone Encounter (Signed)
Dr Loletha Carrow,      Old TIA 2014 (no reoccurance).  U/S carotids in 2017 OK.  Has issues with dizziness and losing balance.  Uses cane intermittently.  Sometimes falls for no reason.  OK today @ [facility] (steady gait, no cane).  Please advise whether or not to proceed with colonoscopy [facility] as scheduled.                                                                                                                                                                                                        Thank you,                                                                                                                                                                                                                    Jette Lewan//PV

## 2015-11-14 NOTE — Telephone Encounter (Signed)
Unable to reach pt via telephone, LMOM explaining to pt that MD confirmed OK to be done at [facility] and cautioned her to have another person available when she is doing bowel prep to assist with fall prevention.                                                                                              _______________Angela/[facility]

## 2015-11-24 ENCOUNTER — Other Ambulatory Visit: Payer: Self-pay | Admitting: Internal Medicine

## 2015-11-25 DIAGNOSIS — H6981 Other specified disorders of Eustachian tube, right ear: Secondary | ICD-10-CM | POA: Diagnosis not present

## 2015-11-25 DIAGNOSIS — H900 Conductive hearing loss, bilateral: Secondary | ICD-10-CM | POA: Diagnosis not present

## 2015-11-28 ENCOUNTER — Encounter: Payer: Self-pay | Admitting: Gastroenterology

## 2015-11-28 ENCOUNTER — Ambulatory Visit (AMBULATORY_SURGERY_CENTER): Payer: Medicare Other | Admitting: Gastroenterology

## 2015-11-28 VITALS — BP 133/69 | HR 81 | Temp 98.4°F | Resp 12 | Ht 65.0 in | Wt 207.0 lb

## 2015-11-28 DIAGNOSIS — Z86010 Personal history of colonic polyps: Secondary | ICD-10-CM | POA: Diagnosis not present

## 2015-11-28 DIAGNOSIS — D123 Benign neoplasm of transverse colon: Secondary | ICD-10-CM

## 2015-11-28 DIAGNOSIS — D122 Benign neoplasm of ascending colon: Secondary | ICD-10-CM | POA: Diagnosis not present

## 2015-11-28 DIAGNOSIS — E119 Type 2 diabetes mellitus without complications: Secondary | ICD-10-CM | POA: Diagnosis not present

## 2015-11-28 DIAGNOSIS — J45909 Unspecified asthma, uncomplicated: Secondary | ICD-10-CM | POA: Diagnosis not present

## 2015-11-28 DIAGNOSIS — I10 Essential (primary) hypertension: Secondary | ICD-10-CM | POA: Diagnosis not present

## 2015-11-28 DIAGNOSIS — K219 Gastro-esophageal reflux disease without esophagitis: Secondary | ICD-10-CM | POA: Diagnosis not present

## 2015-11-28 DIAGNOSIS — E669 Obesity, unspecified: Secondary | ICD-10-CM | POA: Diagnosis not present

## 2015-11-28 LAB — GLUCOSE, CAPILLARY
Glucose-Capillary: 110 mg/dL — ABNORMAL HIGH (ref 65–99)
Glucose-Capillary: 108 mg/dL — ABNORMAL HIGH (ref 65–99)

## 2015-11-28 MED ORDER — SODIUM CHLORIDE 0.9 % IV SOLN: 500.0000 mL | INTRAVENOUS | Status: DC

## 2015-11-28 NOTE — Op Note (Signed)
[facility] Patient Name: Kristy [name] Procedure Date: 11/28/2015 11:35 AM MRN: [id number] Endoscopist: Mallie Mussel L. Loletha Carrow , MD Age: 70 Referring MD:  Date of Birth: [birth year] Gender: Female Account #: [id number] Procedure:                Colonoscopy Indications:              Surveillance: Personal history of adenomatous                            polyps on last colonoscopy > 5 years ago Medicines:                Monitored Anesthesia Care Procedure:                Pre-Anesthesia Assessment:                           - Prior to the procedure, a History and Physical                            was performed, and patient medications and                            allergies were reviewed. The patient's tolerance of                            previous anesthesia was also reviewed. The risks                            and benefits of the procedure and the sedation                            options and risks were discussed with the patient.                            All questions were answered, and informed consent                            was obtained. Prior Anticoagulants: The patient has                            taken aspirin, last dose was 1 day prior to                            procedure. ASA Grade Assessment: III - A patient                            with severe systemic disease. After reviewing the                            risks and benefits, the patient was deemed in                            satisfactory condition to undergo the procedure.  After obtaining informed consent, the colonoscope                            was passed under direct vision. Throughout the                            procedure, the patient's blood pressure, pulse, and                            oxygen saturations were monitored continuously. The                            Model CF-HQ190L [id number]) scope was introduced                            through the anus and  advanced to the the cecum,                            identified by appendiceal orifice and ileocecal                            valve. The colonoscopy was performed without                            difficulty. The patient tolerated the procedure                            well. The quality of the bowel preparation was                            good. The ileocecal valve, appendiceal orifice, and                            rectum were photographed. The bowel preparation                            used was Miralax. Scope In: 12:01:18 PM Scope Out: 12:21:12 PM Scope Withdrawal Time: 0 hours 13 minutes 41 seconds  Total Procedure Duration: 0 hours 19 minutes 54 seconds  Findings:                 The perianal and digital rectal examinations were                            normal.                           Two sessile polyps were found in the proximal                            transverse colon and proximal ascending colon. The                            polyps were 2 to 4 mm in size. These polyps were  removed with a piecemeal technique using a cold                            biopsy forceps. Resection and retrieval were                            complete.                           Internal hemorrhoids were found during                            retroflexion. The hemorrhoids were Grade I                            (internal hemorrhoids that do not prolapse).                           The exam was otherwise without abnormality on                            direct and retroflexion views. Complications:            No immediate complications. Estimated Blood Loss:     Estimated blood loss: none. Impression:               - Two 2 to 4 mm polyps in the proximal transverse                            colon and in the proximal ascending colon, removed                            piecemeal using a cold biopsy forceps. Resected and                            retrieved.                            - The examination was otherwise normal on direct                            and retroflexion views.                           - Internal hemorrhoids. Recommendation:           - Patient has a contact number available for                            emergencies. The signs and symptoms of potential                            delayed complications were discussed with the                            patient. Return to normal activities tomorrow.  Written discharge instructions were provided to the                            patient.                           - Resume previous diet.                           - Continue present medications.                           - Await pathology results.                           - Repeat colonoscopy is recommended for                            surveillance. The colonoscopy date will be                            determined after pathology results from today's                            exam become available for review. Lannah Koike L. Loletha Carrow, MD 11/28/2015 12:26:17 PM This report has been signed electronically.

## 2015-11-28 NOTE — Progress Notes (Signed)
Called to room to assist during endoscopic procedure.  Patient ID and intended procedure confirmed with present staff. Received instructions for my participation in the procedure from the performing physician.  

## 2015-11-28 NOTE — Progress Notes (Signed)
Report to PACU, RN, vss, BBS= Clear.  

## 2015-12-01 ENCOUNTER — Telehealth: Payer: Self-pay | Admitting: *Deleted

## 2015-12-01 NOTE — Telephone Encounter (Signed)
  Follow up Call-  Call back number 11/28/2015  Post procedure Call Back phone  # [phone]  Permission to leave phone message Yes     Patient questions:  Do you have a fever, pain , or abdominal swelling? No. Pain Score  0 *  Have you tolerated food without any problems? Yes.    Have you been able to return to your normal activities? Yes.    Do you have any questions about your discharge instructions: Diet   No. Medications  No. Follow up visit  No.  Do you have questions or concerns about your Care? No.  Actions: * If pain score is 4 or above: No action needed, pain <4.

## 2015-12-04 ENCOUNTER — Encounter: Payer: Self-pay | Admitting: Gastroenterology

## 2015-12-23 ENCOUNTER — Other Ambulatory Visit: Payer: Self-pay | Admitting: Internal Medicine

## 2015-12-25 ENCOUNTER — Ambulatory Visit (INDEPENDENT_AMBULATORY_CARE_PROVIDER_SITE_OTHER): Payer: Medicare Other | Admitting: Internal Medicine

## 2015-12-25 ENCOUNTER — Encounter: Payer: Self-pay | Admitting: Internal Medicine

## 2015-12-25 VITALS — BP 126/74 | HR 84 | Temp 97.5°F | Resp 16 | Ht 65.0 in | Wt 203.1 lb

## 2015-12-25 DIAGNOSIS — I10 Essential (primary) hypertension: Secondary | ICD-10-CM

## 2015-12-25 DIAGNOSIS — R7989 Other specified abnormal findings of blood chemistry: Secondary | ICD-10-CM | POA: Insufficient documentation

## 2015-12-25 DIAGNOSIS — E119 Type 2 diabetes mellitus without complications: Secondary | ICD-10-CM

## 2015-12-25 DIAGNOSIS — R945 Abnormal results of liver function studies: Secondary | ICD-10-CM

## 2015-12-25 LAB — HEMOGLOBIN A1C: HEMOGLOBIN A1C: 6.3 % (ref 4.6–6.5)

## 2015-12-25 NOTE — Progress Notes (Signed)
Subjective:    Patient ID: Kristy [name], female    DOB: 07-Jul-1945, 70 y.o.   MRN: [id number]  DOS:  12/25/2015 Type of visit - description : rov Interval history: DM: Good compliance with medication, diet is regular, "not eating healthy". HTN: Good medication compliance, BP today is very good Note from rheumatology reviewed: Labs noted. History of TIA: Note from neurology 09-2015 reviewed. Also complaining of one-year history of occasional pain at the left side of the abdomen associated w/a knot when she bends over or tries to get up from lying down..   Review of Systems Denies weight loss No chest pain or difficulty breathing No nausea or vomiting.   Past Medical History:  Diagnosis Date  . Allergy    Environmental, foods: shellfish, lactose intolerant, citrus, peanuts  . Asthma    mild persistent  . Atrophic vaginitis   . DJD (degenerative joint disease)    generalized joint pain, including back  . Family history of malignant neoplasm of breast   . FHx: BRCA2 gene positive    BRCA2 mutation in sister  . Fibromyalgia    Dr Amil Amen  . GERD (gastroesophageal reflux disease)   . Gout    Idiopathic, chronic  . Hiatal hernia   . History of kidney stones   . Hypertension   . Palpitations    PSVT on Holter monitoring  . Shortness of breath    occ  . SUI (stress urinary incontinence, female)    (resolved after surgery)  . TIA (transient ischemic attack)    07/2012, see Dr Leonie Man  . Vertigo     Past Surgical History:  Procedure Laterality Date  . ABDOMINAL HYSTERECTOMY  1994   TAH,BSO-BLADDER NECK SUSPENSION  . BREAST SURGERY     LUMPECTOMY-BENIGN Breast Bx X2  . CARPAL TUNNEL RELEASE Right 2005  . CATARACT EXTRACTION, BILATERAL Bilateral 2016   Cataract   . CHOLECYSTECTOMY, LAPAROSCOPIC  2001  . CYSTOCELE REPAIR  1994  . KNEE ARTHROPLASTY  05/19/2012   Procedure: COMPUTER ASSISTED TOTAL KNEE ARTHROPLASTY;  Surgeon: Marybelle Killings, MD;  Location: [facility];  Service:  Orthopedics;  Laterality: Left;  Left  Total Knee Arthroplasty  . KNEE ARTHROPLASTY Right 09/03/2013   Procedure: COMPUTER ASSISTED TOTAL KNEE ARTHROPLASTY;  Surgeon: Marybelle Killings, MD;  Location: [facility];  Service: Orthopedics;  Laterality: Right;  Right Total Knee Arthroplasty, Computer Assist  . KNEE ARTHROSCOPY     bil  . [place] TIBIAL TENDON  2003  . TONSILLECTOMY    . TRIPLE FUSION LEFT ANKLE Left 2007  . TUBAL LIGATION      Social History   Social History  . Marital status: Married    Spouse name: [name]  . Number of children: 2  . Years of education: 16   Occupational History  . 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 Smoking status: Never Smoker  . Smokeless tobacco: Never Used  . Alcohol use No  . Drug use: No  . Sexual activity: No   Other Topics Concern  . 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 married '69. 2 sons - '70, '72: retired - [employer]. SO- good health.   ACP - OK for CPR at least once, no long term ventilation, no heroic measures in the face of poor quality of life. HCPOA - husband, secondary son  Avonda Toso ([phone]            Medication List       Accurate as of 12/25/15  6:14 PM. Always use your most recent med list.          aspirin 325 MG tablet Take 1 tablet (325 mg total) by mouth daily.   ASTEPRO 0.15 % Soln Generic drug:  Azelastine HCl Place 2 sprays into the nose daily.   beclomethasone 80 MCG/ACT inhaler Commonly known as:  QVAR Inhale 1 puff into the lungs 2 (two) times daily.   diltiazem 240 MG 24 hr capsule Commonly known as:  CARDIZEM CD Take 1 capsule (240 mg total) by mouth daily.   EPIPEN 0.3 mg/0.3 mL Devi Generic drug:  EPINEPHrine Inject 0.3 mg into the muscle as needed (anaphylactic). Reported on 06/26/2015   febuxostat 40 MG tablet Commonly known as:  ULORIC Take 40 mg by mouth daily.   fluticasone 50  MCG/ACT nasal spray Commonly known as:  FLONASE Place 2 sprays into both nostrils daily.   furosemide 40 MG tablet Commonly known as:  LASIX Take 1 tablet (40 mg total) by mouth daily.   HM VITAMIN D3 2000 units Caps Generic drug:  Cholecalciferol Take 2,000 Units by mouth daily.   levalbuterol 45 MCG/ACT inhaler Commonly known as:  XOPENEX HFA Inhale 2 puffs into the lungs every 4 (four) hours as needed for shortness of breath.   levocetirizine 5 MG tablet Commonly known as:  XYZAL Take 5 mg by mouth daily.   losartan 50 MG tablet Commonly known as:  COZAAR Take 1 tablet (50 mg total) by mouth every morning.   metFORMIN 850 MG tablet Commonly known as:  GLUCOPHAGE Take 1 tablet (850 mg total) by mouth 2 (two) times daily with a meal.   metoprolol succinate 50 MG 24 hr tablet Commonly known as:  TOPROL-XL Take 50 mg by mouth daily. Take with or immediately following a meal.   NONFORMULARY OR COMPOUNDED ITEM Estradiol .02% 1 ML Prefilled Applicator Sig: apply vaginally twice a week #90 Day Supply with 4 refills   nortriptyline 25 MG capsule Commonly known as:  PAMELOR Take 25 mg by mouth at bedtime.   omeprazole 40 MG capsule Commonly known as:  PRILOSEC Take 1 capsule (40 mg total) by mouth daily.   PATADAY 0.2 % Soln Generic drug:  Olopatadine HCl Place 1 drop into both eyes daily.   Potassium 99 MG Tabs Take by mouth.   vitamin E 400 UNIT capsule Take 400 Units by mouth daily.          Objective:   Physical Exam  Abdominal:     BP 126/74 (BP Location: Left Arm, Patient Position: Sitting, Cuff Size: Normal)   Pulse 84   Temp 97.5 F (36.4 C) (Oral)   Resp 16   Ht '5\' 5"'  (1.651 m)   Wt 203 lb 2 oz (92.1 kg)   SpO2 98%   BMI 33.80 kg/m  General:   Well developed, well nourished . NAD.  HEENT:  Normocephalic . Face symmetric, atraumatic Lungs:  CTA B Normal respiratory effort, no intercostal retractions, no accessory muscle use. Heart: RRR,   no murmur.  no pretibial edema bilaterally  Abdomen:  Not distended, soft, non-tender. No rebound or rigidity.  Skin: Not pale. Not jaundice Neurologic:  alert & oriented X3.  Speech normal, gait appropriate for age and unassisted Psych--  Cognition and judgment appear intact.  Cooperative with normal attention  span and concentration.  Behavior appropriate. No anxious or depressed appearing.    Assessment & Plan:  Assessment DM w/ h/o TIA HTN  Hyperlipidemia  Insomnia  Asthma , allergies-- sees Dr Harold Hedge  GERD, hiatal hernia , h/o IBS Obesity  Palpitations: PSVT by Holter TIA, dx 2014,last visit w/  Dr. Leonie Man 09-2015, RTC prn Vertigo (improved with vestibular rehabilitation 2015) Increased LFTs --- CT abdomen 2014: Normal, no MSK: --Gout --DJD --Fibromyalgia, Dr. Amil Amen, on Pamelor  --Osteopenia  FH breast cancer , sister is BRCA2 (+), pt is  (-) GU: Kidney stones, h/o; Urinary incontinence; Atrophic vaginitis Hyperparathyroidism h/o slt increased PTH 2015 , normal PTH 05-2014  HOH- aids   PLAN: DM: Continue metformin, check A1c, a healthy diet is encouraged HTN: Continue losartan, Toprol, Lasix. Last creatinine normal at rheumatology 09/2015 TIA: Asx for years, last visit with Dr. Leonie Man 09/2015, RTC to neuro  prn  Increase LFTs: So far follow-up by rheumatology, reports hepatitis serology was neg Abdominal wall defect?: Exam normal, observation for now, if sx increase will refer to surgery. RTC 4 months.

## 2015-12-25 NOTE — Progress Notes (Signed)
Pre visit review using our clinic review tool, if applicable. No additional management support is needed unless otherwise documented below in the visit note. 

## 2015-12-25 NOTE — Assessment & Plan Note (Signed)
DM: Continue metformin, check A1c, a healthy diet is encouraged HTN: Continue losartan, Toprol, Lasix. Last creatinine normal at rheumatology 09/2015 TIA: Asx for years, last visit with Dr. Leonie Man 09/2015, RTC to neuro  prn  Increase LFTs: So far follow-up by rheumatology, reports hepatitis serology was neg Abdominal wall defect?: Exam normal, observation for now, if sx increase will refer to surgery. RTC 4 months.

## 2015-12-25 NOTE — Patient Instructions (Signed)
GO TO THE LAB : Get the blood work     GO TO THE FRONT DESK Schedule your next appointment for a  Check up in 4 months   

## 2016-01-06 DIAGNOSIS — E119 Type 2 diabetes mellitus without complications: Secondary | ICD-10-CM | POA: Diagnosis not present

## 2016-01-06 DIAGNOSIS — Z7984 Long term (current) use of oral hypoglycemic drugs: Secondary | ICD-10-CM | POA: Diagnosis not present

## 2016-01-06 DIAGNOSIS — H5213 Myopia, bilateral: Secondary | ICD-10-CM | POA: Diagnosis not present

## 2016-01-06 DIAGNOSIS — H52221 Regular astigmatism, right eye: Secondary | ICD-10-CM | POA: Diagnosis not present

## 2016-01-06 DIAGNOSIS — H26493 Other secondary cataract, bilateral: Secondary | ICD-10-CM | POA: Diagnosis not present

## 2016-01-06 DIAGNOSIS — Z961 Presence of intraocular lens: Secondary | ICD-10-CM | POA: Diagnosis not present

## 2016-01-06 LAB — HM DIABETES EYE EXAM

## 2016-01-22 DIAGNOSIS — J4530 Mild persistent asthma, uncomplicated: Secondary | ICD-10-CM | POA: Diagnosis not present

## 2016-01-22 DIAGNOSIS — J3089 Other allergic rhinitis: Secondary | ICD-10-CM | POA: Diagnosis not present

## 2016-01-22 DIAGNOSIS — J3081 Allergic rhinitis due to animal (cat) (dog) hair and dander: Secondary | ICD-10-CM | POA: Diagnosis not present

## 2016-01-22 DIAGNOSIS — J301 Allergic rhinitis due to pollen: Secondary | ICD-10-CM | POA: Diagnosis not present

## 2016-01-22 DIAGNOSIS — H1045 Other chronic allergic conjunctivitis: Secondary | ICD-10-CM | POA: Diagnosis not present

## 2016-01-23 ENCOUNTER — Encounter: Payer: Self-pay | Admitting: Internal Medicine

## 2016-02-08 ENCOUNTER — Other Ambulatory Visit: Payer: Self-pay | Admitting: Internal Medicine

## 2016-02-15 ENCOUNTER — Other Ambulatory Visit: Payer: Self-pay | Admitting: Internal Medicine

## 2016-03-14 ENCOUNTER — Other Ambulatory Visit: Payer: Self-pay | Admitting: Internal Medicine

## 2016-03-30 ENCOUNTER — Encounter: Payer: Self-pay | Admitting: Internal Medicine

## 2016-03-30 ENCOUNTER — Ambulatory Visit (INDEPENDENT_AMBULATORY_CARE_PROVIDER_SITE_OTHER): Payer: Medicare Other | Admitting: Internal Medicine

## 2016-03-30 VITALS — BP 130/76 | HR 99 | Temp 97.9°F | Resp 14 | Ht 65.0 in | Wt 204.2 lb

## 2016-03-30 DIAGNOSIS — Z23 Encounter for immunization: Secondary | ICD-10-CM | POA: Diagnosis not present

## 2016-03-30 DIAGNOSIS — I10 Essential (primary) hypertension: Secondary | ICD-10-CM

## 2016-03-30 DIAGNOSIS — E118 Type 2 diabetes mellitus with unspecified complications: Secondary | ICD-10-CM | POA: Diagnosis not present

## 2016-03-30 LAB — BASIC METABOLIC PANEL
BUN: 21 mg/dL (ref 6–23)
CHLORIDE: 100 meq/L (ref 96–112)
CO2: 26 meq/L (ref 19–32)
Calcium: 10.1 mg/dL (ref 8.4–10.5)
Creatinine, Ser: 1.09 mg/dL (ref 0.40–1.20)
GFR: 52.54 mL/min — AB (ref >60.00)
GLUCOSE: 124 mg/dL — AB (ref 70–99)
POTASSIUM: 4 meq/L (ref 3.5–5.1)
SODIUM: 140 meq/L (ref 135–145)

## 2016-03-30 LAB — HEMOGLOBIN A1C: HEMOGLOBIN A1C: 6.4 % (ref 4.6–6.5)

## 2016-03-30 LAB — AST: AST: 46 U/L — ABNORMAL HIGH (ref 0–37)

## 2016-03-30 LAB — ALT: ALT: 86 U/L — ABNORMAL HIGH (ref 0–35)

## 2016-03-30 MED ORDER — ZOSTER VACCINE LIVE 19400 UNT/0.65ML ~~LOC~~ SUSR: 0.6500 mL | Freq: Once | SUBCUTANEOUS | 0 refills | Status: AC

## 2016-03-30 NOTE — Patient Instructions (Signed)
GO TO THE LAB : Get the blood work     GO TO THE FRONT DESK Schedule your next appointment for a physical exam in 3-4 months, fasting  

## 2016-03-30 NOTE — Progress Notes (Signed)
Pre visit review using our clinic review tool, if applicable. No additional management support is needed unless otherwise documented below in the visit note. 

## 2016-03-30 NOTE — Progress Notes (Signed)
Subjective:    Patient ID: Kristy [name], female    DOB: 06-02-1945, 70 y.o.   MRN: [id number]  DOS:  03/30/2016 Type of visit - description : rov Interval history: In general feeling well. DM: Good compliance of medications, not ambulatory CBGs. HTN: Good med compliance, BPs are checked frequently at other offices and always normal.   Review of Systems  Reports occasional burning of the feet and night. No numbness.  Past Medical History:  Diagnosis Date  . Allergy    Environmental, foods: shellfish, lactose intolerant, citrus, peanuts  . Asthma    mild persistent  . Atrophic vaginitis   . DJD (degenerative joint disease)    generalized joint pain, including back  . Family history of malignant neoplasm of breast   . FHx: BRCA2 gene positive    BRCA2 mutation in sister  . Fibromyalgia    Dr Amil Amen  . GERD (gastroesophageal reflux disease)   . Gout    Idiopathic, chronic  . Hiatal hernia   . History of kidney stones   . Hypertension   . Palpitations    PSVT on Holter monitoring  . Shortness of breath    occ  . SUI (stress urinary incontinence, female)    (resolved after surgery)  . TIA (transient ischemic attack)    07/2012, see Dr Leonie Man  . Vertigo     Past Surgical History:  Procedure Laterality Date  . ABDOMINAL HYSTERECTOMY  1994   TAH,BSO-BLADDER NECK SUSPENSION  . BREAST SURGERY     LUMPECTOMY-BENIGN Breast Bx X2  . CARPAL TUNNEL RELEASE Right 2005  . CATARACT EXTRACTION, BILATERAL Bilateral 2016   Cataract   . CHOLECYSTECTOMY, LAPAROSCOPIC  2001  . CYSTOCELE REPAIR  1994  . KNEE ARTHROPLASTY  05/19/2012   Procedure: COMPUTER ASSISTED TOTAL KNEE ARTHROPLASTY;  Surgeon: Marybelle Killings, MD;  Location: [facility];  Service: Orthopedics;  Laterality: Left;  Left  Total Knee Arthroplasty  . KNEE ARTHROPLASTY Right 09/03/2013   Procedure: COMPUTER ASSISTED TOTAL KNEE ARTHROPLASTY;  Surgeon: Marybelle Killings, MD;  Location: [facility];  Service: Orthopedics;  Laterality:  Right;  Right Total Knee Arthroplasty, Computer Assist  . KNEE ARTHROSCOPY     bil  . [place] TIBIAL TENDON  2003  . TONSILLECTOMY    . TRIPLE FUSION LEFT ANKLE Left 2007  . TUBAL LIGATION      Social History   Social History  . Marital status: Married    Spouse name: [name]  . Number of children: 2  . Years of education: 16   Occupational History  . 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 Smoking status: Never Smoker  . Smokeless tobacco: Never Used  . Alcohol use No  . Drug use: No  . Sexual activity: No   Other Topics Concern  . 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 married '69. 2 sons - '70, '72: retired - [employer]. SO- good health.   ACP - OK for CPR at least once, no long term ventilation, no heroic measures in the face of poor quality of life. HCPOA - husband, secondary son Sayde Lish (c) [phone]            Medication List       Accurate as of 03/30/16 11:59 PM. Always use your most recent med list.  aspirin 325 MG tablet Take 1 tablet (325 mg total) by mouth daily.   ASTEPRO 0.15 % Soln Generic drug:  Azelastine HCl Place 2 sprays into the nose daily.   beclomethasone 80 MCG/ACT inhaler Commonly known as:  QVAR Inhale 1 puff into the lungs 2 (two) times daily.   diltiazem 240 MG 24 hr capsule Commonly known as:  CARDIZEM CD Take 1 capsule (240 mg total) by mouth daily.   EPIPEN 0.3 mg/0.3 mL Devi Generic drug:  EPINEPHrine Inject 0.3 mg into the muscle as needed (anaphylactic). Reported on 06/26/2015   febuxostat 40 MG tablet Commonly known as:  ULORIC Take 40 mg by mouth daily.   fluticasone 50 MCG/ACT nasal spray Commonly known as:  FLONASE Place 2 sprays into both nostrils daily.   furosemide 40 MG tablet Commonly known as:  LASIX Take 1 tablet (40 mg total) by mouth daily.   HM VITAMIN D3 2000 units Caps Generic drug:   Cholecalciferol Take 2,000 Units by mouth daily.   levalbuterol 45 MCG/ACT inhaler Commonly known as:  XOPENEX HFA Inhale 2 puffs into the lungs every 4 (four) hours as needed for shortness of breath.   levocetirizine 5 MG tablet Commonly known as:  XYZAL Take 5 mg by mouth daily.   losartan 50 MG tablet Commonly known as:  COZAAR Take 1 tablet (50 mg total) by mouth every morning.   metFORMIN 850 MG tablet Commonly known as:  GLUCOPHAGE Take 1 tablet (850 mg total) by mouth 2 (two) times daily with a meal.   metoprolol succinate 50 MG 24 hr tablet Commonly known as:  TOPROL-XL Take 50 mg by mouth daily. Take with or immediately following a meal.   NONFORMULARY OR COMPOUNDED ITEM Estradiol .02% 1 ML Prefilled Applicator Sig: apply vaginally twice a week #90 Day Supply with 4 refills   nortriptyline 25 MG capsule Commonly known as:  PAMELOR Take 25 mg by mouth at bedtime.   omeprazole 40 MG capsule Commonly known as:  PRILOSEC Take 1 capsule (40 mg total) by mouth daily.   PATADAY 0.2 % Soln Generic drug:  Olopatadine HCl Place 1 drop into both eyes daily.   Potassium 99 MG Tabs Take by mouth.   vitamin E 400 UNIT capsule Take 400 Units by mouth daily.   Zoster Vaccine Live (PF) 19400 UNT/0.65ML injection Commonly known as:  ZOSTAVAX Inject 19,400 Units into the skin once.          Objective:   Physical Exam BP 130/76 (BP Location: Left Arm, Patient Position: Sitting, Cuff Size: Normal)   Pulse 99   Temp 97.9 F (36.6 C) (Oral)   Resp 14   Ht _0  (1.651 m)   Wt 204 lb 4 oz (92.6 kg)   SpO2 95%   BMI 33.99 kg/m  General:   Well developed, well nourished . NAD.  HEENT:  Normocephalic . Face symmetric, atraumatic Lungs:  CTA B Normal respiratory effort, no intercostal retractions, no accessory muscle use. Heart: RRR,  no murmur.  No pretibial edema bilaterally  DIABETIC FEET EXAM: No lower extremity edema Normal pedal pulses bilaterally Skin  normal, nails normal, no calluses Pinprick examination of the feet normal. Neurologic:  alert & oriented X3.  Speech normal, gait appropriate for age and unassisted Psych--  Cognition and judgment appear intact.  Cooperative with normal attention span and concentration.  Behavior appropriate. No anxious or depressed appearing.      Assessment & Plan:  Assessment DM w/ h/o  TIA HTN  Hyperlipidemia  Insomnia  Asthma , allergies-- sees Dr Harold Hedge  GERD, hiatal hernia , h/o IBS Obesity  Palpitations: PSVT by Holter, Dr Gwenlyn Found TIA, dx 2014,last visit w/  Dr. Leonie Man 09-2015, RTC prn Vertigo (improved with vestibular rehabilitation 2015) Increased LFTs --- CT abdomen 2014: Normal, no MSK: --Gout --DJD --Fibromyalgia, Dr. Amil Amen, on Pamelor  --Osteopenia  FH breast cancer , sister is BRCA2 (+), pt is  (-) GU: Kidney stones, h/o; Urinary incontinence; Atrophic vaginitis Hyperparathyroidism h/o slt increased PTH 2015 , normal PTH 05-2014  HOH- aids   PLAN: DM: Continue metformin, check A1c and LFTs HTN: Continue losartan, Toprol, Lasix, diltiazem. Check a BMP Hyperlipidemia: Diet control, diet discussed. Other issues follow-up elsewhere. Flu shot today, benefits of Zostavax discussed, prescription printed RTC 3-4 months, CPX

## 2016-04-01 NOTE — Assessment & Plan Note (Signed)
DM: Continue metformin, check A1c and LFTs HTN: Continue losartan, Toprol, Lasix, diltiazem. Check a BMP Hyperlipidemia: Diet control, diet discussed. Other issues follow-up elsewhere. Flu shot today, benefits of Zostavax discussed, prescription printed RTC 3-4 months, CPX

## 2016-04-20 DIAGNOSIS — R945 Abnormal results of liver function studies: Secondary | ICD-10-CM | POA: Diagnosis not present

## 2016-04-20 DIAGNOSIS — M150 Primary generalized (osteo)arthritis: Secondary | ICD-10-CM | POA: Diagnosis not present

## 2016-04-20 DIAGNOSIS — M1A09X0 Idiopathic chronic gout, multiple sites, without tophus (tophi): Secondary | ICD-10-CM | POA: Diagnosis not present

## 2016-04-20 DIAGNOSIS — M797 Fibromyalgia: Secondary | ICD-10-CM | POA: Diagnosis not present

## 2016-04-20 DIAGNOSIS — M65321 Trigger finger, right index finger: Secondary | ICD-10-CM | POA: Diagnosis not present

## 2016-04-23 ENCOUNTER — Other Ambulatory Visit: Payer: Self-pay | Admitting: Cardiovascular Disease

## 2016-04-26 NOTE — Telephone Encounter (Signed)
Rx(s) sent to pharmacy electronically.  

## 2016-04-30 ENCOUNTER — Encounter: Payer: Self-pay | Admitting: Cardiovascular Disease

## 2016-04-30 ENCOUNTER — Ambulatory Visit (INDEPENDENT_AMBULATORY_CARE_PROVIDER_SITE_OTHER): Payer: Medicare Other | Admitting: Cardiovascular Disease

## 2016-04-30 ENCOUNTER — Ambulatory Visit: Payer: Medicare Other | Admitting: Cardiovascular Disease

## 2016-04-30 VITALS — BP 134/86 | HR 89 | Ht 67.0 in | Wt 208.0 lb

## 2016-04-30 DIAGNOSIS — I10 Essential (primary) hypertension: Secondary | ICD-10-CM

## 2016-04-30 DIAGNOSIS — R000 Tachycardia, unspecified: Secondary | ICD-10-CM

## 2016-04-30 NOTE — Progress Notes (Signed)
04/30/2016 Kristy [name]   [birth year]  [id number]  Primary [place], [place] Primary Cardiologist: Lorretta Harp MD Renae Gloss  HPI:  [name] is a 70 year old moderately overweight married Caucasian female mother of 2 sons referred by Dr. Larose Kells for evaluation of palpitations and PSVT. I last saw her in the office 04/15/15. Her cardiac risk factor profile is notable for treated hypertension. She's had a TIA 07/14/12 followed by Dr. Leonie Man. She's never had a heart attack. She has normal LV function by 2-D echocardiogram last year. 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 She complained of some palpitations and a 48 hour Holter monitor showed sinus rhythm, sinus tachycardia, PACs and short runs of PSVT. I began her on low-dose oral beta blocker which resulted in marked improvement in her symptoms. Since I saw her a year ago she's done clinically well. She does have a few breakthrough episodes of the most part her symptoms have been controlled on a beta blocker. She has had 2 episodes of left upper chest and shoulder discomfort which lasted approximately half hour in the summer and about a month ago.    Current Outpatient Prescriptions  Medication Sig Dispense Refill  . aspirin 325 MG tablet Take 1 tablet (325 mg total) by mouth daily.    . Azelastine HCl (ASTEPRO) 0.15 % SOLN Place 2 sprays into the nose daily.    . beclomethasone (QVAR) 80 MCG/ACT inhaler Inhale 1 puff into the lungs 2 (two) times daily.    . Cholecalciferol (HM VITAMIN D3) 2000 UNITS CAPS Take 2,000 Units by mouth daily.    Marland Kitchen diltiazem (CARDIZEM CD) 240 MG 24 hr capsule Take 1 capsule (240 mg total) by mouth daily. 30 capsule 5  . EPINEPHrine (EPIPEN) 0.3 mg/0.3 mL DEVI Inject 0.3 mg into the muscle as needed (anaphylactic). Reported on 06/26/2015    . febuxostat (ULORIC) 40 MG tablet Take 40 mg by mouth daily.    . fluticasone (FLONASE) 50 MCG/ACT nasal spray Place 2 sprays into  both nostrils daily.    . furosemide (LASIX) 40 MG tablet Take 1 tablet (40 mg total) by mouth daily. 30 tablet 5  . levalbuterol (XOPENEX HFA) 45 MCG/ACT inhaler Inhale 2 puffs into the lungs every 4 (four) hours as needed for shortness of breath.     . levocetirizine (XYZAL) 5 MG tablet Take 5 mg by mouth daily.    Marland Kitchen losartan (COZAAR) 50 MG tablet Take 1 tablet (50 mg total) by mouth every morning. 30 tablet 1  . metFORMIN (GLUCOPHAGE) 850 MG tablet Take 1 tablet (850 mg total) by mouth 2 (two) times daily with a meal. 60 tablet 1  . metoprolol succinate (TOPROL-XL) 50 MG 24 hr tablet TAKE 1 TABLET BY MOUTH DAILY 30 tablet 0  . NONFORMULARY OR COMPOUNDED ITEM Estradiol .02% 1 ML Prefilled Applicator Sig: apply vaginally twice a week #90 Day Supply with 4 refills 1 each 4  . nortriptyline (PAMELOR) 25 MG capsule Take 25 mg by mouth at bedtime.      . Olopatadine HCl (PATADAY) 0.2 % SOLN Place 1 drop into both eyes daily.    Marland Kitchen omeprazole (PRILOSEC) 40 MG capsule Take 1 capsule (40 mg total) by mouth daily. 30 capsule 5  . Potassium 99 MG TABS Take by mouth.    . vitamin E 400 UNIT capsule Take 400 Units by mouth daily.     No current facility-administered medications  for this visit.     Allergies  Allergen Reactions  . Iodine Anaphylaxis  . Shellfish Allergy Anaphylaxis  . Sulfonamide Derivatives Rash  . Other Swelling    Citrus  Sometimes Lactose intolerant  . Amoxicillin Diarrhea    Social History   Social History  . Marital status: Married    Spouse name: Kristy [name]  . Number of children: 2  . Years of education: 16   Occupational History  . 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 Smoking status: Never Smoker  . Smokeless tobacco: Never Used  . Alcohol use No  . Drug use: No  . Sexual activity: No   Other Topics Concern  . 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 married '69. 2 sons - '70, '72: retired -  [employer]. SO- good health.   ACP - OK for CPR at least once, no long term ventilation, no heroic measures in the face of poor quality of life. HCPOA - husband, secondary son Kristy [name] (c) [phone]         Review of Systems: General: negative for chills, fever, night sweats or weight changes.  Cardiovascular: negative for chest pain, dyspnea on exertion, edema, orthopnea, palpitations, paroxysmal nocturnal dyspnea or shortness of breath Dermatological: negative for rash Respiratory: negative for cough or wheezing Urologic: negative for hematuria Abdominal: negative for nausea, vomiting, diarrhea, bright red blood per rectum, melena, or hematemesis Neurologic: negative for visual changes, syncope, or dizziness All other systems reviewed and are otherwise negative except as noted above.    Blood pressure 134/86, pulse 89, height 5\' 7"  (1.702 m), weight 208 lb (94.3 kg).  General appearance: alert and no distress Neck: no adenopathy, no carotid bruit, no JVD, supple, symmetrical, trachea midline and thyroid not enlarged, symmetric, no tenderness/mass/nodules Lungs: clear to auscultation bilaterally Heart: regular rate and rhythm, S1, S2 normal, no murmur, click, rub or gallop Extremities: extremities normal, atraumatic, no cyanosis or edema  EKG sinus rhythm 89 without ST or T-wave changes. I personally reviewed this EKG  ASSESSMENT AND PLAN:   Essential hypertension History of hypertension blood pressure measured 134/86. She is on diltiazem, losartan and metoprolol. Continue current meds at current dosing  Tachycardia History of PSVT controlled on beta blocker and calcium channel blocker.      Lorretta Harp MD FACP,FACC,FAHA, [facility] 04/30/2016 11:46 AM

## 2016-04-30 NOTE — Assessment & Plan Note (Signed)
History of hypertension blood pressure measured 134/86. She is on diltiazem, losartan and metoprolol. Continue current meds at current dosing

## 2016-04-30 NOTE — Assessment & Plan Note (Signed)
History of PSVT controlled on beta blocker and calcium channel blocker.

## 2016-04-30 NOTE — Patient Instructions (Signed)

## 2016-05-09 ENCOUNTER — Other Ambulatory Visit: Payer: Self-pay | Admitting: Internal Medicine

## 2016-06-15 ENCOUNTER — Other Ambulatory Visit: Payer: Self-pay | Admitting: Cardiovascular Disease

## 2016-06-23 ENCOUNTER — Other Ambulatory Visit: Payer: Self-pay | Admitting: Internal Medicine

## 2016-06-25 ENCOUNTER — Other Ambulatory Visit: Payer: Self-pay | Admitting: Gynecology

## 2016-06-25 ENCOUNTER — Other Ambulatory Visit: Payer: Self-pay | Admitting: Obstetrics & Gynecology

## 2016-06-25 DIAGNOSIS — Z1231 Encounter for screening mammogram for malignant neoplasm of breast: Secondary | ICD-10-CM

## 2016-06-29 ENCOUNTER — Other Ambulatory Visit (INDEPENDENT_AMBULATORY_CARE_PROVIDER_SITE_OTHER): Payer: Medicare Other

## 2016-06-29 ENCOUNTER — Ambulatory Visit (INDEPENDENT_AMBULATORY_CARE_PROVIDER_SITE_OTHER): Payer: Medicare Other | Admitting: Internal Medicine

## 2016-06-29 ENCOUNTER — Encounter: Payer: Self-pay | Admitting: Internal Medicine

## 2016-06-29 VITALS — BP 124/78 | HR 99 | Temp 97.6°F | Resp 14 | Ht 67.0 in | Wt 206.5 lb

## 2016-06-29 DIAGNOSIS — E213 Hyperparathyroidism, unspecified: Secondary | ICD-10-CM

## 2016-06-29 DIAGNOSIS — Z0000 Encounter for general adult medical examination without abnormal findings: Secondary | ICD-10-CM | POA: Diagnosis not present

## 2016-06-29 DIAGNOSIS — E785 Hyperlipidemia, unspecified: Secondary | ICD-10-CM

## 2016-06-29 DIAGNOSIS — R7989 Other specified abnormal findings of blood chemistry: Secondary | ICD-10-CM

## 2016-06-29 DIAGNOSIS — I10 Essential (primary) hypertension: Secondary | ICD-10-CM

## 2016-06-29 DIAGNOSIS — R945 Abnormal results of liver function studies: Secondary | ICD-10-CM

## 2016-06-29 LAB — CBC WITH DIFFERENTIAL/PLATELET
BASOS ABS: 0.1 10*3/uL (ref 0.0–0.1)
Basophils Relative: 0.8 % (ref 0.0–3.0)
EOS ABS: 0.2 10*3/uL (ref 0.0–0.7)
Eosinophils Relative: 2.4 % (ref 0.0–5.0)
HCT: 43.7 % (ref 36.0–46.0)
Hemoglobin: 14.8 g/dL (ref 12.0–15.0)
LYMPHS ABS: 3.9 10*3/uL (ref 0.7–4.0)
Lymphocytes Relative: 39.3 % (ref 12.0–46.0)
MCHC: 33.8 g/dL (ref 30.0–36.0)
MCV: 84.4 fl (ref 78.0–100.0)
MONOS PCT: 9.5 % (ref 3.0–12.0)
Monocytes Absolute: 0.9 10*3/uL (ref 0.1–1.0)
NEUTROS ABS: 4.7 10*3/uL (ref 1.4–7.7)
NEUTROS PCT: 48 % (ref 43.0–77.0)
PLATELETS: 302 10*3/uL (ref 150.0–400.0)
RBC: 5.18 Mil/uL — ABNORMAL HIGH (ref 3.87–5.11)
RDW: 13.3 % (ref 11.5–15.5)
WBC: 9.8 10*3/uL (ref 4.0–10.5)

## 2016-06-29 LAB — COMPREHENSIVE METABOLIC PANEL
ALBUMIN: 4.7 g/dL (ref 3.5–5.2)
ALK PHOS: 72 U/L (ref 39–117)
ALT: 70 U/L — AB (ref 0–35)
AST: 44 U/L — ABNORMAL HIGH (ref 0–37)
BILIRUBIN TOTAL: 0.9 mg/dL (ref 0.2–1.2)
BUN: 22 mg/dL (ref 6–23)
CO2: 29 mEq/L (ref 19–32)
CREATININE: 1 mg/dL (ref 0.40–1.20)
Calcium: 9.8 mg/dL (ref 8.4–10.5)
Chloride: 99 mEq/L (ref 96–112)
GFR: 57.99 mL/min — ABNORMAL LOW (ref >60.00)
Glucose, Bld: 125 mg/dL — ABNORMAL HIGH (ref 70–99)
POTASSIUM: 3.9 meq/L (ref 3.5–5.1)
SODIUM: 138 meq/L (ref 135–145)
TOTAL PROTEIN: 7.9 g/dL (ref 6.0–8.3)

## 2016-06-29 LAB — LIPID PANEL
Cholesterol: 171 mg/dL (ref 0–200)
HDL: 32.4 mg/dL — AB (ref >39.00)
NonHDL: 138.77
Total CHOL/HDL Ratio: 5
Triglycerides: 258 mg/dL — ABNORMAL HIGH (ref 0.0–149.0)
VLDL: 51.6 mg/dL — ABNORMAL HIGH (ref 0.0–40.0)

## 2016-06-29 LAB — TSH: TSH: 5.25 u[IU]/mL — ABNORMAL HIGH (ref 0.35–4.50)

## 2016-06-29 LAB — LDL CHOLESTEROL, DIRECT: LDL DIRECT: 97 mg/dL

## 2016-06-29 NOTE — Assessment & Plan Note (Addendum)
Rec to have a medicare wellness   Last colonoscopy 11-2015, next in 5 years Last mammogram 05-2014, to see gyn 07-07-16 and a MMG shortly after  shingrix when available

## 2016-06-29 NOTE — Progress Notes (Signed)
Subjective:    Patient ID: Kristy [name], female    DOB: 11/29/1945, 71 y.o.   MRN: [id number]  DOS:  06/29/2016 Type of visit - description : rov Interval history: No major concerns. Chronic medical problem seems to be well-controlled. Good compliance of medication without apparent side effects. No ambulatory BPs but different visits and is usually 120/70. No recent gout episodes.    Review of Systems   Constitutional: No fever. No chills. No unexplained wt changes. No unusual sweats  HEENT: No dental problems, no ear discharge, no facial swelling, no voice changes. No eye discharge, no eye  redness , no  intolerance to light   Respiratory: No wheezing , no  difficulty breathing. No cough , no mucus production  Cardiovascular: No CP, no leg swelling , no  Palpitations  GI: no nausea, no vomiting, no diarrhea , no  abdominal pain.  No blood in the stools. No dysphagia, no odynophagia    Endocrine: No polyphagia, no polyuria , no polydipsia  GU: No dysuria, gross hematuria, difficulty urinating. No urinary urgency, no frequency.  Musculoskeletal: No joint swellings or unusual aches or pains  Skin: No change in the color of the skin, palor , no  Rash  Allergic, immunologic: No environmental allergies , no  food allergies  Neurological: No dizziness no  syncope. No headaches. No diplopia, no slurred, no slurred speech, no motor deficits, no facial  Numbness  Hematological: No enlarged lymph nodes, no easy bruising , no unusual bleedings  Psychiatry: No suicidal ideas, no hallucinations, no beavior problems, no confusion.  No unusual/severe anxiety, no depression   Past Medical History:  Diagnosis Date  . Allergy    Environmental, foods: shellfish, lactose intolerant, citrus, peanuts  . Asthma    mild persistent  . Atrophic vaginitis   . DJD (degenerative joint disease)    generalized joint pain, including back  . Family history of malignant neoplasm of breast   .  FHx: BRCA2 gene positive    BRCA2 mutation in sister  . Fibromyalgia    Dr Amil Amen  . GERD (gastroesophageal reflux disease)   . Gout    Idiopathic, chronic  . Hiatal hernia   . History of kidney stones   . Hypertension   . Palpitations    PSVT on Holter monitoring  . Shortness of breath    occ  . SUI (stress urinary incontinence, female)    (resolved after surgery)  . TIA (transient ischemic attack)    07/2012, see Dr Leonie Man  . Vertigo     Past Surgical History:  Procedure Laterality Date  . ABDOMINAL HYSTERECTOMY  1994   TAH,BSO-BLADDER NECK SUSPENSION  . BREAST SURGERY     LUMPECTOMY-BENIGN Breast Bx X2  . CARPAL TUNNEL RELEASE Right 2005  . CATARACT EXTRACTION, BILATERAL Bilateral 2016   Cataract   . CHOLECYSTECTOMY, LAPAROSCOPIC  2001  . CYSTOCELE REPAIR  1994  . KNEE ARTHROPLASTY  05/19/2012   Procedure: COMPUTER ASSISTED TOTAL KNEE ARTHROPLASTY;  Surgeon: Marybelle Killings, MD;  Location: [facility];  Service: Orthopedics;  Laterality: Left;  Left  Total Knee Arthroplasty  . KNEE ARTHROPLASTY Right 09/03/2013   Procedure: COMPUTER ASSISTED TOTAL KNEE ARTHROPLASTY;  Surgeon: Marybelle Killings, MD;  Location: [facility];  Service: Orthopedics;  Laterality: Right;  Right Total Knee Arthroplasty, Computer Assist  . KNEE ARTHROSCOPY     bil  . [place] TIBIAL TENDON  2003  .  TONSILLECTOMY    . TRIPLE FUSION LEFT ANKLE Left 2007  . TUBAL LIGATION      Social History   Social History  . Marital status: Married    Spouse name: [name]  . Number of children: 2  . Years of education: 16   Occupational History  . 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 Smoking status: Never Smoker  . Smokeless tobacco: Never Used  . Alcohol use No  . Drug use: No  . Sexual activity: No   Other Topics Concern  . 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 married '69. 2 sons - '70, '72: retired - [employer].  SO- good health.   ACP - OK for CPR at least once, no long term ventilation, no heroic measures in the face of poor quality of life. HCPOA - husband, secondary son Latona Krichbaum (c) [phone]          Allergies as of 06/29/2016      Reactions   Iodine Anaphylaxis   Shellfish Allergy Anaphylaxis   Sulfonamide Derivatives Rash   Other Swelling   Citrus  Sometimes Lactose intolerant   Amoxicillin Diarrhea      Medication List       Accurate as of 06/29/16 11:59 PM. Always use your most recent med list.          aspirin 325 MG tablet Take 1 tablet (325 mg total) by mouth daily.   ASTEPRO 0.15 % Soln Generic drug:  Azelastine HCl Place 2 sprays into the nose daily.   beclomethasone 80 MCG/ACT inhaler Commonly known as:  QVAR Inhale 1 puff into the lungs 2 (two) times daily.   diltiazem 240 MG 24 hr capsule Commonly known as:  CARDIZEM CD Take 1 capsule (240 mg total) by mouth daily.   EPIPEN 0.3 mg/0.3 mL Devi Generic drug:  EPINEPHrine Inject 0.3 mg into the muscle as needed (anaphylactic). Reported on 06/26/2015   febuxostat 40 MG tablet Commonly known as:  ULORIC Take 40 mg by mouth daily.   fluticasone 50 MCG/ACT nasal spray Commonly known as:  FLONASE Place 2 sprays into both nostrils daily.   furosemide 40 MG tablet Commonly known as:  LASIX Take 1 tablet (40 mg total) by mouth daily.   HM VITAMIN D3 2000 units Caps Generic drug:  Cholecalciferol Take 2,000 Units by mouth daily.   levalbuterol 45 MCG/ACT inhaler Commonly known as:  XOPENEX HFA Inhale 2 puffs into the lungs every 4 (four) hours as needed for shortness of breath.   levocetirizine 5 MG tablet Commonly known as:  XYZAL Take 5 mg by mouth daily.   losartan 50 MG tablet Commonly known as:  COZAAR Take 1 tablet (50 mg total) by mouth every morning.   metFORMIN 850 MG tablet Commonly known as:  GLUCOPHAGE Take 1 tablet (850 mg total) by mouth 2 (two) times daily with a meal.     metoprolol succinate 50 MG 24 hr tablet Commonly known as:  TOPROL-XL TAKE 1 TABLET BY MOUTH DAILY   NONFORMULARY OR COMPOUNDED ITEM Estradiol .02% 1 ML Prefilled Applicator Sig: apply vaginally twice a week #90 Day Supply with 4 refills   nortriptyline 25 MG capsule Commonly known as:  PAMELOR Take 25 mg by mouth at bedtime.   omeprazole 40 MG capsule Commonly known as:  PRILOSEC Take 1 capsule (40 mg total) by mouth daily.   PATADAY 0.2 %  Soln Generic drug:  Olopatadine HCl Place 1 drop into both eyes daily.   Potassium 99 MG Tabs Take by mouth.   vitamin E 400 UNIT capsule Take 400 Units by mouth daily.          Objective:   Physical Exam BP 124/78 (BP Location: Left Arm, Patient Position: Sitting, Cuff Size: Normal)   Pulse 99   Temp 97.6 F (36.4 C) (Oral)   Resp 14   Ht _0  (1.702 m)   Wt 206 lb 8 oz (93.7 kg)   SpO2 98%   BMI 32.34 kg/m   General:   Well developed, well nourished . NAD.  Neck: No  thyromegaly  HEENT:  Normocephalic . Face symmetric, atraumatic Lungs:  CTA B Normal respiratory effort, no intercostal retractions, no accessory muscle use. Heart: RRR,  no murmur.  No pretibial edema bilaterally  Abdomen:  Not distended, soft, non-tender. No rebound or rigidity.   Skin: Exposed areas without rash. Not pale. Not jaundice Neurologic:  alert & oriented X3.  Speech normal, gait appropriate for age and unassisted Strength symmetric and appropriate for age.  Psych: Cognition and judgment appear intact.  Cooperative with normal attention span and concentration.  Behavior appropriate. No anxious or depressed appearing.    Assessment & Plan:    Assessment DM w/ h/o TIA HTN  Hyperlipidemia  Insomnia  Asthma , allergies-- sees Dr Harold Hedge  GERD, hiatal hernia , h/o IBS Obesity  Palpitations: PSVT by Holter, Dr Gwenlyn Found TIA, dx 2014,last visit w/  Dr. Leonie Man 09-2015, RTC prn Vertigo (improved with vestibular rehabilitation  2015) Increased LFTs --- CT abdomen 2014: NormalLiver, negative hepatitis serologies at rheumatology per patient MSK: --Gout --DJD --Fibromyalgia, Dr. Amil Amen, on Pamelor  --Osteopenia  FH breast cancer , sister is BRCA2 (+), pt is  (-) GU: Kidney stones, h/o; Urinary incontinence; Atrophic vaginitis Hyperparathyroidism h/o slt increased PTH 2015 , normal PTH 05-2014  HOH- aids   PLAN: DM: On metformin, last A1c satisfactory, no change HTN: Seems well-controlled on Toprol, losartan, Lasix, Cardizem. Check a CMP, CBC, TSH Hyperlipidemia: Check a FLP. Currently on diet control Gout: No recent episodes Fibromyalgia: On nortriptyline, relatively well controlled Increase LFTs: Checking labs today H/o hyperparathyroidism: Check a PTH RTC 5-6 months Recommend a Medicare wellness.

## 2016-06-29 NOTE — Patient Instructions (Signed)
    GO TO THE FRONT DESK Schedule your next appointment for a  checkup in 5-6 months. No need to be fasting  Also schedule a Medicare wellness with one of our nurses  Please go to the [facility] lab, get your blood work done over there today

## 2016-06-29 NOTE — Progress Notes (Signed)
Pre visit review using our clinic review tool, if applicable. No additional management support is needed unless otherwise documented below in the visit note. 

## 2016-06-30 LAB — PTH, INTACT AND CALCIUM
Calcium: 9.8 mg/dL (ref 8.6–10.4)
PTH: 74 pg/mL — ABNORMAL HIGH (ref 14–64)

## 2016-06-30 NOTE — Assessment & Plan Note (Signed)
DM: On metformin, last A1c satisfactory, no change HTN: Seems well-controlled on Toprol, losartan, Lasix, Cardizem. Check a CMP, CBC, TSH Hyperlipidemia: Check a FLP. Currently on diet control Gout: No recent episodes Fibromyalgia: On nortriptyline, relatively well controlled Increase LFTs: Checking labs today H/o hyperparathyroidism: Check a PTH RTC 5-6 months Recommend a Medicare wellness.

## 2016-07-07 ENCOUNTER — Ambulatory Visit (INDEPENDENT_AMBULATORY_CARE_PROVIDER_SITE_OTHER): Payer: Medicare Other | Admitting: Obstetrics & Gynecology

## 2016-07-07 ENCOUNTER — Encounter: Payer: Self-pay | Admitting: Obstetrics & Gynecology

## 2016-07-07 VITALS — BP 110/83 | HR 101 | Ht 65.0 in | Wt 207.0 lb

## 2016-07-07 DIAGNOSIS — Z01419 Encounter for gynecological examination (general) (routine) without abnormal findings: Secondary | ICD-10-CM

## 2016-07-07 DIAGNOSIS — Z0000 Encounter for general adult medical examination without abnormal findings: Secondary | ICD-10-CM | POA: Diagnosis not present

## 2016-07-07 DIAGNOSIS — N952 Postmenopausal atrophic vaginitis: Secondary | ICD-10-CM

## 2016-07-07 MED ORDER — ESTRADIOL 0.1 MG/GM VA CREA: TOPICAL_CREAM | VAGINAL | 12 refills | Status: DC

## 2016-07-07 NOTE — Progress Notes (Signed)
Subjective:     Kristy [name] is a 71 y.o. female here for a routine exam. Pt s/p hyst in 1994 for AUB.  Pt had ovaries removed at the same time.  Last GYN exam was 2  1/2 years prev.  Pt reports 2 months of hot flushes. She was using EES cream. Pt has been out of the EES cream for 6 months.  She was using it 2x/week in the summer. Pt reports vaginal dryness but, no irritation.         Gynecologic History No LMP recorded. Patient has had a hysterectomy. Contraception: status post hysterectomy Last Pap: years prev.  Last mammogram: 05/2014. Results were: normal  Had a bx on the right that has been normal on last check  Obstetric History OB History  Gravida Para Term Preterm AB Living  2 2       2   SAB TAB Ectopic Multiple Live Births               # Outcome Date GA Lbr Len/2nd Weight Sex Delivery Anes PTL Lv  2 Para           1 Para                The following portions of the patient's history were reviewed and updated as appropriate: allergies, current medications, past family history, past medical history, past social history, past surgical history and problem list.  Review of Systems Pertinent items are noted in HPI.    Objective:   BP 110/83 (BP Location: Left Arm, Patient Position: Sitting)   Pulse (!) 101   Ht 5\' 5"  (1.651 m)   Wt 207 lb (93.9 kg)   BMI 34.45 kg/m   General Appearance:    Alert, cooperative, no distress, appears stated age  Head:    Normocephalic, without obvious abnormality, atraumatic  Eyes:    conjunctiva/corneas clear, EOM's intact, both eyes  Ears:    Normal external ear canals, both ears  Nose:   Nares normal, septum midline, mucosa normal, no drainage    or sinus tenderness  Throat:   Lips, mucosa, and tongue normal; teeth and gums normal  Neck:   Supple, symmetrical, trachea midline, no adenopathy;    thyroid:  no enlargement/tenderness/nodules  Back:     Symmetric, no curvature, ROM normal, no CVA tenderness  Lungs:     Clear to  auscultation bilaterally, respirations unlabored  Chest Wall:    No tenderness or deformity   Heart:    Regular rate and rhythm, S1 and S2 normal, no murmur, rub   or gallop  Breast Exam:    No tenderness, masses, or nipple abnormality  Abdomen:     Soft, non-tender, bowel sounds active all four quadrants,    no masses, no organomegaly  Genitalia:    Normal female without lesion, discharge or tenderness; uterus and cervix surgically absent     Extremities:   Extremities normal, atraumatic, no cyanosis or edema  Pulses:   2+ and symmetric all extremities  Skin:   Skin color, texture, turgor normal, no rashes or lesions     Assessment:    Healthy female exam.   breast cancer screening Atrophic vaginits   Plan:    Follow up in: 1 year.    Mammogram screening 07/22/2016 Estrace vaginal cream 2x/week  Hoyle Sauer L. Harraway-Smith, M.D., Cherlynn June

## 2016-07-07 NOTE — Patient Instructions (Signed)
Atrophic Vaginitis Introduction Atrophic vaginitis is a condition in which the tissues that line the vagina become dry and thin. This condition is most common in women who have stopped having regular menstrual periods (menopause). This usually starts when a woman is 59-72 years old. Estrogen helps to keep the vagina moist. It stimulates the vagina to produce a clear fluid that lubricates the vagina for sexual intercourse. This fluid also protects the vagina from infection. Lack of estrogen can cause the lining of the vagina to get thinner and dryer. The vagina may also shrink in size. It may become less elastic. Atrophic vaginitis tends to get worse over time as a woman's estrogen level drops. What are the causes? This condition is caused by the normal drop in estrogen that happens around the time of menopause. What increases the risk? Certain conditions or situations may lower a woman's estrogen level, which increases her risk of atrophic vaginitis. These include:  Taking medicine that blocks estrogen.  Having ovaries removed surgically.  Being treated for cancer with X-ray treatment (radiation) or medicines (chemotherapy).  Exercising very hard and often.  Having an eating disorder (anorexia).  Giving birth or breastfeeding.  Being over the age of 19.  Smoking. What are the signs or symptoms? Symptoms of this condition include:  Pain, soreness, or bleeding during sexual intercourse (dyspareunia).  Vaginal burning, irritation, or itching.  Pain or bleeding during a vaginal examination using a speculum (pelvic exam).  Loss of interest in sexual activity.  Having burning pain when passing urine.  Vaginal discharge that is brown or yellow. In some cases, there are no symptoms. How is this diagnosed? This condition is diagnosed with a medical history and physical exam. This will include a pelvic exam that checks whether the inside of your vagina appears pale, thin, or dry.  Rarely, you may also have other tests, including:  A urine test.  A test that checks the acid balance in your vaginal fluid (acid balance test). How is this treated? Treatment for this condition may depend on the severity of your symptoms. Treatment may include:  Using an over-the-counter vaginal lubricant before you have sexual intercourse.  Using a long-acting vaginal moisturizer.  Using low-dose vaginal estrogen for moderate to severe symptoms that do not respond to other treatments. Options include creams, tablets, and inserts (vaginal rings). Before using vaginal estrogen, tell your health care provider if you have a history of:  Breast cancer.  Endometrial cancer.  Blood clots.  Taking medicines. You may be able to take a daily pill for dyspareunia. Discuss all of the risks of this medicine with your health care provider. It is usually not recommended for women who have a family history or personal history of breast cancer. If your symptoms are very mild and you are not sexually active, you may not need treatment. Follow these instructions at home:  Take medicines only as directed by your health care provider. Do not use herbal or alternative medicines unless your health care provider says that you can.  Use over-the-counter creams, lubricants, or moisturizers for dryness only as directed by your health care provider.  If your atrophic vaginitis is caused by menopause, discuss all of your menopausal symptoms and treatment options with your health care provider.  Do not douche.  Do not use products that can make your vagina dry. These include:  Scented feminine sprays.  Scented tampons.  Scented soaps.  If it hurts to have sex, talk with your sexual partner. Contact a  health care provider if:  Your discharge looks different than normal.  Your vagina has an unusual smell.  You have new symptoms.  Your symptoms do not improve with treatment.  Your symptoms get  worse. This information is not intended to replace advice given to you by your health care provider. Make sure you discuss any questions you have with your health care provider. Document Released: 10/01/2014 Document Revised: 10/23/2015 Document Reviewed: 05/08/2014  2017 [employer]vier

## 2016-07-22 ENCOUNTER — Ambulatory Visit
Admission: RE | Admit: 2016-07-22 | Discharge: 2016-07-22 | Disposition: A | Discharge status: Home / Self Care | Payer: Medicare Other | Source: Ambulatory Visit | Attending: Obstetrics & Gynecology | Admitting: Obstetrics & Gynecology

## 2016-07-22 DIAGNOSIS — Z1231 Encounter for screening mammogram for malignant neoplasm of breast: Secondary | ICD-10-CM

## 2016-07-27 DIAGNOSIS — J3089 Other allergic rhinitis: Secondary | ICD-10-CM | POA: Diagnosis not present

## 2016-07-27 DIAGNOSIS — J301 Allergic rhinitis due to pollen: Secondary | ICD-10-CM | POA: Diagnosis not present

## 2016-07-27 DIAGNOSIS — J3081 Allergic rhinitis due to animal (cat) (dog) hair and dander: Secondary | ICD-10-CM | POA: Diagnosis not present

## 2016-07-27 DIAGNOSIS — J4530 Mild persistent asthma, uncomplicated: Secondary | ICD-10-CM | POA: Diagnosis not present

## 2016-08-07 ENCOUNTER — Other Ambulatory Visit: Payer: Self-pay | Admitting: Internal Medicine

## 2016-09-12 ENCOUNTER — Other Ambulatory Visit: Payer: Self-pay | Admitting: Internal Medicine

## 2016-10-13 ENCOUNTER — Encounter: Payer: Self-pay | Admitting: Gynecology

## 2016-10-18 DIAGNOSIS — Z6835 Body mass index (BMI) 35.0-35.9, adult: Secondary | ICD-10-CM | POA: Diagnosis not present

## 2016-10-18 DIAGNOSIS — M1A09X0 Idiopathic chronic gout, multiple sites, without tophus (tophi): Secondary | ICD-10-CM | POA: Diagnosis not present

## 2016-10-18 DIAGNOSIS — M5136 Other intervertebral disc degeneration, lumbar region: Secondary | ICD-10-CM | POA: Diagnosis not present

## 2016-10-18 DIAGNOSIS — R945 Abnormal results of liver function studies: Secondary | ICD-10-CM | POA: Diagnosis not present

## 2016-10-18 DIAGNOSIS — E669 Obesity, unspecified: Secondary | ICD-10-CM | POA: Diagnosis not present

## 2016-10-18 DIAGNOSIS — M65321 Trigger finger, right index finger: Secondary | ICD-10-CM | POA: Diagnosis not present

## 2016-10-18 DIAGNOSIS — M797 Fibromyalgia: Secondary | ICD-10-CM | POA: Diagnosis not present

## 2016-10-18 DIAGNOSIS — M150 Primary generalized (osteo)arthritis: Secondary | ICD-10-CM | POA: Diagnosis not present

## 2016-11-23 ENCOUNTER — Telehealth: Payer: Self-pay | Admitting: Internal Medicine

## 2016-11-23 DIAGNOSIS — H838X3 Other specified diseases of inner ear, bilateral: Secondary | ICD-10-CM | POA: Diagnosis not present

## 2016-11-23 DIAGNOSIS — H903 Sensorineural hearing loss, bilateral: Secondary | ICD-10-CM | POA: Diagnosis not present

## 2016-11-23 NOTE — Telephone Encounter (Signed)
Pt returned call to office and stated it was okay to cx appt with [facility] on 11/26/16 for awv.

## 2016-11-23 NOTE — Telephone Encounter (Signed)
Called pt to r/s awv. Pt completed awv at GYN office in Feb 2018. AWV due Feb 2019. Lvm for pt to call office to schedule appt.

## 2016-11-25 ENCOUNTER — Ambulatory Visit: Payer: Medicare Other | Admitting: Internal Medicine

## 2016-11-26 ENCOUNTER — Encounter: Payer: Self-pay | Admitting: Internal Medicine

## 2016-11-26 ENCOUNTER — Ambulatory Visit (INDEPENDENT_AMBULATORY_CARE_PROVIDER_SITE_OTHER): Payer: Medicare Other | Admitting: Internal Medicine

## 2016-11-26 VITALS — BP 122/80 | HR 87 | Temp 98.2°F | Resp 14 | Ht 65.0 in | Wt 215.2 lb

## 2016-11-26 DIAGNOSIS — E118 Type 2 diabetes mellitus with unspecified complications: Secondary | ICD-10-CM | POA: Diagnosis not present

## 2016-11-26 DIAGNOSIS — R946 Abnormal results of thyroid function studies: Secondary | ICD-10-CM | POA: Diagnosis not present

## 2016-11-26 DIAGNOSIS — I10 Essential (primary) hypertension: Secondary | ICD-10-CM | POA: Diagnosis not present

## 2016-11-26 DIAGNOSIS — R609 Edema, unspecified: Secondary | ICD-10-CM | POA: Diagnosis not present

## 2016-11-26 DIAGNOSIS — R7989 Other specified abnormal findings of blood chemistry: Secondary | ICD-10-CM

## 2016-11-26 LAB — TSH: TSH: 2.4 u[IU]/mL (ref 0.35–4.50)

## 2016-11-26 LAB — HEMOGLOBIN A1C: Hgb A1c MFr Bld: 6.8 % — ABNORMAL HIGH (ref 4.6–6.5)

## 2016-11-26 LAB — COMPREHENSIVE METABOLIC PANEL
ALT: 67 U/L — AB (ref 0–35)
AST: 46 U/L — ABNORMAL HIGH (ref 0–37)
Albumin: 4.4 g/dL (ref 3.5–5.2)
Alkaline Phosphatase: 70 U/L (ref 39–117)
BILIRUBIN TOTAL: 0.8 mg/dL (ref 0.2–1.2)
BUN: 15 mg/dL (ref 6–23)
CALCIUM: 9.7 mg/dL (ref 8.4–10.5)
CHLORIDE: 103 meq/L (ref 96–112)
CO2: 27 meq/L (ref 19–32)
Creatinine, Ser: 0.85 mg/dL (ref 0.40–1.20)
GFR: 69.87 mL/min (ref >60.00)
GLUCOSE: 125 mg/dL — AB (ref 70–99)
POTASSIUM: 4.2 meq/L (ref 3.5–5.1)
Sodium: 140 mEq/L (ref 135–145)
Total Protein: 7.5 g/dL (ref 6.0–8.3)

## 2016-11-26 LAB — T3, FREE: T3 FREE: 4.3 pg/mL — AB (ref 2.3–4.2)

## 2016-11-26 LAB — T4, FREE: FREE T4: 0.88 ng/dL (ref 0.60–1.60)

## 2016-11-26 NOTE — Progress Notes (Signed)
Subjective:    Patient ID: Kristy [name], female    DOB: 1945-12-07, 71 y.o.   MRN: [id number]  DOS:  11/26/2016 Type of visit - description : Routine visit Interval history: HTN: Good medication compliance ambulatory BP 130/70. DM: Reports that recently her CBGs have been a little higher, no specific readings. Edema: Went to the beach from [place] 17 to the 24th,  developed generalized swelling at the feet, hands and face (upper eyelids ) as [place] as [place] Denies itching, rash, lip or tongue swelling, no difficulty breathing or orthopnea. SX  significantly improved shortly after she came back to town.  Review of Systems See history of present illness. As far as edema, she does not recall eating more salt than usual.  Past Medical History:  Diagnosis Date  . Allergy    Environmental, foods: shellfish, lactose intolerant, citrus, peanuts  . Asthma    mild persistent  . Atrophic vaginitis   . BRCA gene mutation negative 2015   testing done at [facility]  . DJD (degenerative joint disease)    generalized joint pain, including back  . Family history of malignant neoplasm of breast   . Fibromyalgia    Dr Amil Amen  . GERD (gastroesophageal reflux disease)   . Gout    Idiopathic, chronic  . Hiatal hernia   . History of kidney stones   . Hypertension   . Palpitations    PSVT on Holter monitoring  . Shortness of breath    occ  . SUI (stress urinary incontinence, female)    (resolved after surgery)  . TIA (transient ischemic attack)    07/2012, see Dr Leonie Man  . Vertigo     Past Surgical History:  Procedure Laterality Date  . ABDOMINAL HYSTERECTOMY  1994   TAH,BSO-BLADDER NECK SUSPENSION  . BREAST SURGERY     LUMPECTOMY-BENIGN Breast Bx X2  . CARPAL TUNNEL RELEASE Right 2005  . CATARACT EXTRACTION, BILATERAL Bilateral 2016   Cataract   . CHOLECYSTECTOMY, LAPAROSCOPIC  2001  . CYSTOCELE REPAIR  1994  . KNEE ARTHROPLASTY  05/19/2012   Procedure: COMPUTER  ASSISTED TOTAL KNEE ARTHROPLASTY;  Surgeon: Marybelle Killings, MD;  Location: [place];  Service: Orthopedics;  Laterality: Left;  Left  Total Knee Arthroplasty  . KNEE ARTHROPLASTY Right 09/03/2013   Procedure: COMPUTER ASSISTED TOTAL KNEE ARTHROPLASTY;  Surgeon: Marybelle Killings, MD;  Location: [place];  Service: Orthopedics;  Laterality: Right;  Right Total Knee Arthroplasty, Computer Assist  . KNEE ARTHROSCOPY     bil  . [place] TIBIAL TENDON  2003  . TONSILLECTOMY    . TRIPLE FUSION LEFT ANKLE Left 2007  . TUBAL LIGATION      Social History   Social History  . Marital status: Married    Spouse name: [name]  . Number of children: 2  . Years of education: 16   Occupational History  . 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 Smoking status: Never Smoker  . Smokeless tobacco: Never Used  . Alcohol use No  . Drug use: No  . Sexual activity: No   Other Topics Concern  . 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 married '69. 2 sons - '70, '72: retired - [employer]. SO- good health.   ACP - OK for CPR at least once, no long term  ventilation, no heroic measures in the face of poor quality of life. HCPOA - husband, secondary son Anu Stagner (c) [phone]          Allergies as of 11/26/2016      Reactions   Iodine Anaphylaxis   Shellfish Allergy Anaphylaxis   Sulfonamide Derivatives Rash   Other Swelling   Citrus  Sometimes Lactose intolerant   Amoxicillin Diarrhea      Medication List       Accurate as of 11/26/16 10:14 PM. Always use your most recent med list.          aspirin 325 MG tablet Take 1 tablet (325 mg total) by mouth daily.   ASTEPRO 0.15 % Soln Generic drug:  Azelastine HCl Place 2 sprays into the nose daily.   beclomethasone 80 MCG/ACT inhaler Commonly known as:  QVAR Inhale 1 puff into the lungs 2 (two) times daily.   diltiazem 240 MG 24 hr capsule Commonly known  as:  CARDIZEM CD Take 1 capsule (240 mg total) by mouth daily.   EPIPEN 0.3 mg/0.3 mL Devi Generic drug:  EPINEPHrine Inject 0.3 mg into the muscle as needed (anaphylactic). Reported on 06/26/2015   estradiol 0.1 MG/GM vaginal cream Commonly known as:  ESTRACE Apply 1 gram per vagina every two times a week   febuxostat 40 MG tablet Commonly known as:  ULORIC Take 40 mg by mouth daily.   fluticasone 50 MCG/ACT nasal spray Commonly known as:  FLONASE Place 2 sprays into both nostrils daily.   furosemide 40 MG tablet Commonly known as:  LASIX Take 1 tablet (40 mg total) by mouth daily.   HM VITAMIN D3 2000 units Caps Generic drug:  Cholecalciferol Take 2,000 Units by mouth daily.   levalbuterol 45 MCG/ACT inhaler Commonly known as:  XOPENEX HFA Inhale 2 puffs into the lungs every 4 (four) hours as needed for shortness of breath.   levocetirizine 5 MG tablet Commonly known as:  XYZAL Take 5 mg by mouth daily.   losartan 50 MG tablet Commonly known as:  COZAAR Take 1 tablet (50 mg total) by mouth every morning.   metFORMIN 850 MG tablet Commonly known as:  GLUCOPHAGE Take 1 tablet (850 mg total) by mouth 2 (two) times daily with a meal.   metoprolol succinate 50 MG 24 hr tablet Commonly known as:  TOPROL-XL TAKE 1 TABLET BY MOUTH DAILY   nortriptyline 25 MG capsule Commonly known as:  PAMELOR Take 25 mg by mouth at bedtime.   omeprazole 40 MG capsule Commonly known as:  PRILOSEC Take 1 capsule (40 mg total) by mouth daily.   PATADAY 0.2 % Soln Generic drug:  Olopatadine HCl Place 1 drop into both eyes daily.   Potassium 99 MG Tabs Take by mouth.   vitamin E 400 UNIT capsule Take 400 Units by mouth daily.          Objective:   Physical Exam BP 122/80 (BP Location: Left Arm, Patient Position: Sitting, Cuff Size: Normal)   Pulse 87   Temp 98.2 F (36.8 C) (Oral)   Resp 14   Ht '5\' 5"'  (1.651 m)   Wt 215 lb 4 oz (97.6 kg)   SpO2 97%   BMI 35.82 kg/m   General:   Well developed, well nourished . NAD.  HEENT:  Normocephalic . Face symmetric, atraumatic. Face without swelling Lungs:  CTA B Normal respiratory effort, no intercostal retractions, no accessory muscle use. Heart: RRR,  no murmur.  Trace pretibial  edema bilaterally . Feet with no obvious swelling. Skin: Not pale. Not jaundice Neurologic:  alert & oriented X3.  Speech normal, gait appropriate for age and unassisted Psych--  Cognition and judgment appear intact.  Cooperative with normal attention span and concentration.  Behavior appropriate. No anxious or depressed appearing.      Assessment & Plan:   Assessment DM w/ h/o TIA HTN  Hyperlipidemia  Insomnia  Asthma , allergies-- sees Dr Harold Hedge  GERD, hiatal hernia , h/o IBS Obesity  Palpitations: PSVT by Holter, Dr Gwenlyn Found TIA, dx 2014,last visit w/  Dr. Leonie Man 09-2015, RTC prn Vertigo (improved with vestibular rehabilitation 2015) Increased LFTs --- CT abdomen 2014: NormalLiver, negative hepatitis serologies at rheumatology per patient MSK: --Gout --DJD --Fibromyalgia, Dr. Amil Amen, on Pamelor  --Osteopenia  FH breast cancer , sister is BRCA2 (+), pt is  (-) GU: Kidney stones, h/o; Urinary incontinence; Atrophic vaginitis Hyperparathyroidism h/o slt increased PTH 2015 , normal PTH 05-2014  HOH- aids   PLAN: DM: Continue with metformin. Check A1c HTN: Seems well-controlled with diltiazem, Lasix, losartan, metoprolol, OTC potassium. Check a CMP Edema: Unclear etiology, swelling was temporary. Recommend observation, low-salt diet and call if symptoms resurface. Elevated TSH: checking TFTs. Gout: Per rheumatology, they increased Uloric from 40-80 mg based on a uric acid of 6.5 on 10-19-16 , she couldn't tolerate it. Back on 40 mg. Recommend to d/w rheumatology. RTC 4-5 months.

## 2016-11-26 NOTE — Assessment & Plan Note (Signed)
DM: Continue with metformin. Check A1c HTN: Seems well-controlled with diltiazem, Lasix, losartan, metoprolol, OTC potassium. Check a CMP Edema: Unclear etiology, swelling was temporary. Recommend observation, low-salt diet and call if symptoms resurface. Elevated TSH: checking TFTs. Gout: Per rheumatology, they increased Uloric from 40-80 mg based on a uric acid of 6.5 on 10-19-16 , she couldn't tolerate it. Back on 40 mg. Recommend to d/w rheumatology. RTC 4-5 months.

## 2016-11-26 NOTE — Patient Instructions (Signed)
GO TO THE LAB : Get the blood work     GO TO THE FRONT DESK Schedule your next appointment for a   Routine checkup in 4-5 months

## 2016-11-26 NOTE — Progress Notes (Signed)
Pre visit review using our clinic review tool, if applicable. No additional management support is needed unless otherwise documented below in the visit note. 

## 2016-12-10 ENCOUNTER — Other Ambulatory Visit: Payer: Self-pay | Admitting: Cardiovascular Disease

## 2016-12-17 DIAGNOSIS — E119 Type 2 diabetes mellitus without complications: Secondary | ICD-10-CM | POA: Diagnosis not present

## 2016-12-17 DIAGNOSIS — Z7984 Long term (current) use of oral hypoglycemic drugs: Secondary | ICD-10-CM | POA: Diagnosis not present

## 2016-12-17 DIAGNOSIS — Z961 Presence of intraocular lens: Secondary | ICD-10-CM | POA: Diagnosis not present

## 2016-12-17 DIAGNOSIS — H5213 Myopia, bilateral: Secondary | ICD-10-CM | POA: Diagnosis not present

## 2016-12-17 DIAGNOSIS — H52221 Regular astigmatism, right eye: Secondary | ICD-10-CM | POA: Diagnosis not present

## 2016-12-17 LAB — HM DIABETES EYE EXAM

## 2016-12-18 ENCOUNTER — Other Ambulatory Visit: Payer: Self-pay | Admitting: Internal Medicine

## 2016-12-30 ENCOUNTER — Encounter: Payer: Self-pay | Admitting: Internal Medicine

## 2017-01-24 DIAGNOSIS — J3089 Other allergic rhinitis: Secondary | ICD-10-CM | POA: Diagnosis not present

## 2017-01-24 DIAGNOSIS — J301 Allergic rhinitis due to pollen: Secondary | ICD-10-CM | POA: Diagnosis not present

## 2017-01-24 DIAGNOSIS — J4530 Mild persistent asthma, uncomplicated: Secondary | ICD-10-CM | POA: Diagnosis not present

## 2017-01-24 DIAGNOSIS — J3081 Allergic rhinitis due to animal (cat) (dog) hair and dander: Secondary | ICD-10-CM | POA: Diagnosis not present

## 2017-02-14 ENCOUNTER — Other Ambulatory Visit: Payer: Self-pay | Admitting: Internal Medicine

## 2017-03-12 ENCOUNTER — Other Ambulatory Visit: Payer: Self-pay | Admitting: Internal Medicine

## 2017-03-28 ENCOUNTER — Encounter: Payer: Self-pay | Admitting: Internal Medicine

## 2017-03-28 ENCOUNTER — Ambulatory Visit (INDEPENDENT_AMBULATORY_CARE_PROVIDER_SITE_OTHER): Payer: Medicare Other | Admitting: Internal Medicine

## 2017-03-28 VITALS — BP 132/78 | HR 88 | Temp 97.7°F | Resp 14 | Ht 65.0 in | Wt 205.4 lb

## 2017-03-28 DIAGNOSIS — E118 Type 2 diabetes mellitus with unspecified complications: Secondary | ICD-10-CM

## 2017-03-28 DIAGNOSIS — R7989 Other specified abnormal findings of blood chemistry: Secondary | ICD-10-CM

## 2017-03-28 DIAGNOSIS — I10 Essential (primary) hypertension: Secondary | ICD-10-CM | POA: Diagnosis not present

## 2017-03-28 DIAGNOSIS — R945 Abnormal results of liver function studies: Secondary | ICD-10-CM

## 2017-03-28 DIAGNOSIS — R935 Abnormal findings on diagnostic imaging of other abdominal regions, including retroperitoneum: Secondary | ICD-10-CM

## 2017-03-28 DIAGNOSIS — R109 Unspecified abdominal pain: Secondary | ICD-10-CM | POA: Diagnosis not present

## 2017-03-28 DIAGNOSIS — Z23 Encounter for immunization: Secondary | ICD-10-CM | POA: Diagnosis not present

## 2017-03-28 LAB — COMPREHENSIVE METABOLIC PANEL
ALBUMIN: 4.5 g/dL (ref 3.5–5.2)
ALK PHOS: 71 U/L (ref 39–117)
ALT: 56 U/L — ABNORMAL HIGH (ref 0–35)
AST: 30 U/L (ref 0–37)
BUN: 17 mg/dL (ref 6–23)
CHLORIDE: 101 meq/L (ref 96–112)
CO2: 28 mEq/L (ref 19–32)
Calcium: 9.4 mg/dL (ref 8.4–10.5)
Creatinine, Ser: 0.86 mg/dL (ref 0.40–1.20)
GFR: 68.87 mL/min (ref >60.00)
GLUCOSE: 120 mg/dL — AB (ref 70–99)
POTASSIUM: 3.5 meq/L (ref 3.5–5.1)
SODIUM: 140 meq/L (ref 135–145)
Total Bilirubin: 1.1 mg/dL (ref 0.2–1.2)
Total Protein: 7.5 g/dL (ref 6.0–8.3)

## 2017-03-28 LAB — HEMOGLOBIN A1C: Hgb A1c MFr Bld: 6.5 % (ref 4.6–6.5)

## 2017-03-28 NOTE — Assessment & Plan Note (Signed)
DM: Currently on metformin 850 mg twice a day, would like to switch to a smaller capsule (500 mg).  We will check a A1c, if good results consider decrease  Metformin dose; we could switch her to 500 mg 3 or 4 tablets daily.  Normal foot exam today HTN: Continue with Cardizem, Lasix, losartan, metoprolol and OTC potassium.  Check a CMP Increased LFTs: Chronic issue but more consistent since 09/2015, related to Uloric?.  Will let rheumatology known.  Rechecking labs today.  Get abdominal ultrasound Left abdominal pain: Sounds mechanical in nature.  We are checking ultrasound, for increased LFTs. Elevated TSH: Last TSH satisfactory, recheck in few months Flu shot today RTC CPX 3 months

## 2017-03-28 NOTE — Progress Notes (Signed)
Subjective:    Patient ID: Kristy [name], female    DOB: 1945/08/30, 71 y.o.   MRN: [id number]  DOS:  03/28/2017 Type of visit - description : rov Interval history: HTN: Good compliance with medication, amb BPs normal Increased LFTs: See recent labs, will recheck them. Having left abdominal pain when she bends in certain ways, this is going on for a while, after she bends pain stays there for 10 or 15 minutes.  No injury or mass. DM: Good compliance with medications, diet needs improvement per patient.  Review of Systems Denies fever chills No lower extremity edema No nausea, vomiting no blood in the stools.  She does have occasional diarrhea, history of IBS.  Past Medical History:  Diagnosis Date  . Allergy    Environmental, foods: shellfish, lactose intolerant, citrus, peanuts  . Asthma    mild persistent  . Atrophic vaginitis   . BRCA gene mutation negative 2015   testing done at [facility]  . DJD (degenerative joint disease)    generalized joint pain, including back  . Family history of malignant neoplasm of breast   . Fibromyalgia    Dr Amil Amen  . GERD (gastroesophageal reflux disease)   . Gout    Idiopathic, chronic  . Hiatal hernia   . History of kidney stones   . Hypertension   . Palpitations    PSVT on Holter monitoring  . Shortness of breath    occ  . SUI (stress urinary incontinence, female)    (resolved after surgery)  . TIA (transient ischemic attack)    07/2012, see Dr Leonie Man  . Vertigo     Past Surgical History:  Procedure Laterality Date  . ABDOMINAL HYSTERECTOMY  1994   TAH,BSO-BLADDER NECK SUSPENSION  . BREAST SURGERY     LUMPECTOMY-BENIGN Breast Bx X2  . CARPAL TUNNEL RELEASE Right 2005  . CATARACT EXTRACTION, BILATERAL Bilateral 2016   Cataract   . CHOLECYSTECTOMY, LAPAROSCOPIC  2001  . CYSTOCELE REPAIR  1994  . KNEE ARTHROPLASTY  05/19/2012   Procedure: COMPUTER ASSISTED TOTAL KNEE ARTHROPLASTY;  Surgeon: Marybelle Killings, MD;  Location:  [place];  Service: Orthopedics;  Laterality: Left;  Left  Total Knee Arthroplasty  . KNEE ARTHROPLASTY Right 09/03/2013   Procedure: COMPUTER ASSISTED TOTAL KNEE ARTHROPLASTY;  Surgeon: Marybelle Killings, MD;  Location: [place];  Service: Orthopedics;  Laterality: Right;  Right Total Knee Arthroplasty, Computer Assist  . KNEE ARTHROSCOPY     bil  . [place] TIBIAL TENDON  2003  . TONSILLECTOMY    . TRIPLE FUSION LEFT ANKLE Left 2007  . TUBAL LIGATION      Social History   Social History  . Marital status: Married    Spouse name: [name]  . Number of children: 2  . Years of education: 16   Occupational History  . 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 Smoking status: Never Smoker  . Smokeless tobacco: Never Used  . Alcohol use No  . Drug use: No  . Sexual activity: No   Other Topics Concern  . 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 married '69. 2 sons - '70, '72: retired - [employer]. SO- good health.   ACP - OK for CPR at least once, no long term ventilation, no heroic measures in the face of poor quality of life.  HCPOA - husband, secondary son Meygan Kyser (c) [phone]          Allergies as of 03/28/2017      Reactions   Iodine Anaphylaxis   Shellfish Allergy Anaphylaxis   Sulfonamide Derivatives Rash   Other Swelling   Citrus  Sometimes Lactose intolerant   Amoxicillin Diarrhea      Medication List       Accurate as of 03/28/17  4:13 PM. Always use your most recent med list.          aspirin 325 MG tablet Take 1 tablet (325 mg total) by mouth daily.   ASTEPRO 0.15 % Soln Generic drug:  Azelastine HCl Place 2 sprays into the nose daily.   diltiazem 240 MG 24 hr capsule Commonly known as:  CARDIZEM CD Take 1 capsule (240 mg total) by mouth daily.   EPIPEN 0.3 mg/0.3 mL Devi Generic drug:  EPINEPHrine Inject 0.3 mg into the muscle as needed (anaphylactic).  Reported on 06/26/2015   estradiol 0.1 MG/GM vaginal cream Commonly known as:  ESTRACE Apply 1 gram per vagina every two times a week   febuxostat 40 MG tablet Commonly known as:  ULORIC Take 80 mg by mouth daily.   fluticasone 50 MCG/ACT nasal spray Commonly known as:  FLONASE Place 2 sprays into both nostrils daily.   furosemide 40 MG tablet Commonly known as:  LASIX Take 1 tablet (40 mg total) by mouth daily.   HM VITAMIN D3 2000 units Caps Generic drug:  Cholecalciferol Take 2,000 Units by mouth daily.   levalbuterol 45 MCG/ACT inhaler Commonly known as:  XOPENEX HFA Inhale 2 puffs into the lungs every 4 (four) hours as needed for shortness of breath.   levocetirizine 5 MG tablet Commonly known as:  XYZAL Take 5 mg by mouth daily.   losartan 50 MG tablet Commonly known as:  COZAAR Take 1 tablet (50 mg total) by mouth every morning.   metFORMIN 850 MG tablet Commonly known as:  GLUCOPHAGE Take 1 tablet (850 mg total) by mouth 2 (two) times daily with a meal.   metoprolol succinate 50 MG 24 hr tablet Commonly known as:  TOPROL-XL TAKE 1 TABLET BY MOUTH DAILY   nortriptyline 25 MG capsule Commonly known as:  PAMELOR Take 25 mg by mouth at bedtime.   omeprazole 40 MG capsule Commonly known as:  PRILOSEC Take 1 capsule (40 mg total) by mouth daily.   Potassium 99 MG Tabs Take by mouth.   QVAR REDIHALER 80 MCG/ACT inhaler Generic drug:  beclomethasone Inhale 1 puff into the lungs 2 (two) times daily.   vitamin E 400 UNIT capsule Take 400 Units by mouth daily.          Objective:   Physical Exam  Abdominal:     BP 132/78 (BP Location: Left Arm, Patient Position: Sitting, Cuff Size: Normal)   Pulse 88   Temp 97.7 F (36.5 C) (Oral)   Resp 14   Ht '5\' 5"'  (1.651 m)   Wt 205 lb 6 oz (93.2 kg)   SpO2 94%   BMI 34.18 kg/m  General:   Well developed, well nourished . NAD.  HEENT:  Normocephalic . Face symmetric, atraumatic Lungs:  CTA B Normal  respiratory effort, no intercostal retractions, no accessory muscle use. Heart: RRR,  no murmur.  no pretibial edema bilaterally  Abdomen:  Not distended, soft, non-tender. No rebound or rigidity.   DIABETIC FEET EXAM: No lower extremity edema Normal pedal pulses bilaterally  Skin normal, nails normal, no calluses Pinprick examination of the feet normal. Skin: Not pale. Not jaundice Neurologic:  alert & oriented X3.  Speech normal, gait appropriate for age and unassisted Psych--  Cognition and judgment appear intact.  Cooperative with normal attention span and concentration.  Behavior appropriate. No anxious or depressed appearing.     Assessment & Plan:    Assessment DM w/ h/o TIA HTN  Hyperlipidemia  Insomnia  Asthma , allergies-- sees Dr Harold Hedge  GERD, hiatal hernia , h/o IBS Obesity  Palpitations: PSVT by Holter, Dr Gwenlyn Found TIA, dx 2014,last visit w/  Dr. Leonie Man 09-2015, RTC prn Vertigo (improved with vestibular rehabilitation 2015) Increased LFTs --- CT abdomen 2014: NormalLiver, negative hepatitis serologies at rheumatology per patient MSK: --Gout --DJD --Fibromyalgia, Dr. Amil Amen, on Pamelor  --Osteopenia  FH breast cancer , sister is BRCA2 (+), pt is  (-) GU: Kidney stones, h/o; Urinary incontinence; Atrophic vaginitis Hyperparathyroidism h/o slt increased PTH 2015 , normal PTH 05-2014  HOH- aids   PLAN: DM: Currently on metformin 850 mg twice a day, would like to switch to a smaller capsule (500 mg).  We will check a A1c, if good results consider decrease  Metformin dose; we could switch her to 500 mg 3 or 4 tablets daily.  Normal foot exam today HTN: Continue with Cardizem, Lasix, losartan, metoprolol and OTC potassium.  Check a CMP Increased LFTs: Chronic issue but more consistent since 09/2015, related to Uloric?.  Will let rheumatology known.  Rechecking labs today.  Get abdominal ultrasound Left abdominal pain: Sounds mechanical in nature.  We are checking  ultrasound, for increased LFTs. Elevated TSH: Last TSH satisfactory, recheck in few months Flu shot today RTC CPX 3 months

## 2017-03-28 NOTE — Progress Notes (Signed)
Pre visit review using our clinic review tool, if applicable. No additional management support is needed unless otherwise documented below in the visit note. 

## 2017-03-28 NOTE — Patient Instructions (Signed)
GO TO THE LAB : Get the blood work     GO TO THE FRONT DESK Schedule your next appointment for a   physical exam, fasting in 3 months

## 2017-03-29 ENCOUNTER — Ambulatory Visit (HOSPITAL_BASED_OUTPATIENT_CLINIC_OR_DEPARTMENT_OTHER)
Admission: RE | Admit: 2017-03-29 | Discharge: 2017-03-29 | Disposition: A | Discharge status: Home / Self Care | Payer: Medicare Other | Source: Ambulatory Visit | Attending: Internal Medicine | Admitting: Internal Medicine

## 2017-03-29 DIAGNOSIS — R7989 Other specified abnormal findings of blood chemistry: Secondary | ICD-10-CM | POA: Diagnosis not present

## 2017-03-29 DIAGNOSIS — R935 Abnormal findings on diagnostic imaging of other abdominal regions, including retroperitoneum: Secondary | ICD-10-CM | POA: Insufficient documentation

## 2017-03-29 DIAGNOSIS — R109 Unspecified abdominal pain: Secondary | ICD-10-CM | POA: Insufficient documentation

## 2017-03-29 DIAGNOSIS — R932 Abnormal findings on diagnostic imaging of liver and biliary tract: Secondary | ICD-10-CM | POA: Insufficient documentation

## 2017-03-29 DIAGNOSIS — R945 Abnormal results of liver function studies: Secondary | ICD-10-CM | POA: Insufficient documentation

## 2017-03-31 NOTE — Addendum Note (Signed)
Addended byDamita Dunnings D on: 03/31/2017 03:40 PM   Modules accepted: Orders

## 2017-04-02 ENCOUNTER — Ambulatory Visit (HOSPITAL_BASED_OUTPATIENT_CLINIC_OR_DEPARTMENT_OTHER)
Admission: RE | Admit: 2017-04-02 | Discharge: 2017-04-02 | Disposition: A | Discharge status: Home / Self Care | Payer: Medicare Other | Source: Ambulatory Visit | Attending: Internal Medicine | Admitting: Internal Medicine

## 2017-04-02 DIAGNOSIS — R945 Abnormal results of liver function studies: Secondary | ICD-10-CM | POA: Insufficient documentation

## 2017-04-02 DIAGNOSIS — R109 Unspecified abdominal pain: Secondary | ICD-10-CM | POA: Insufficient documentation

## 2017-04-10 ENCOUNTER — Other Ambulatory Visit: Payer: Self-pay | Admitting: Internal Medicine

## 2017-04-11 ENCOUNTER — Other Ambulatory Visit: Payer: Self-pay

## 2017-04-11 MED ORDER — DILTIAZEM HCL ER COATED BEADS 240 MG PO CP24: 240.0000 mg | ORAL_CAPSULE | Freq: Every day | ORAL | 1 refills | Status: DC

## 2017-04-11 MED ORDER — LOSARTAN POTASSIUM 50 MG PO TABS: 50.0000 mg | ORAL_TABLET | Freq: Every morning | ORAL | 1 refills | Status: DC

## 2017-04-11 MED ORDER — METFORMIN HCL 850 MG PO TABS: 850.0000 mg | ORAL_TABLET | Freq: Two times a day (BID) | ORAL | 1 refills | Status: DC

## 2017-04-20 DIAGNOSIS — M797 Fibromyalgia: Secondary | ICD-10-CM | POA: Diagnosis not present

## 2017-04-20 DIAGNOSIS — M5136 Other intervertebral disc degeneration, lumbar region: Secondary | ICD-10-CM | POA: Diagnosis not present

## 2017-04-20 DIAGNOSIS — M150 Primary generalized (osteo)arthritis: Secondary | ICD-10-CM | POA: Diagnosis not present

## 2017-04-20 DIAGNOSIS — E669 Obesity, unspecified: Secondary | ICD-10-CM | POA: Diagnosis not present

## 2017-04-20 DIAGNOSIS — M1A09X0 Idiopathic chronic gout, multiple sites, without tophus (tophi): Secondary | ICD-10-CM | POA: Diagnosis not present

## 2017-04-20 DIAGNOSIS — R945 Abnormal results of liver function studies: Secondary | ICD-10-CM | POA: Diagnosis not present

## 2017-04-20 DIAGNOSIS — M65321 Trigger finger, right index finger: Secondary | ICD-10-CM | POA: Diagnosis not present

## 2017-04-20 DIAGNOSIS — Z6834 Body mass index (BMI) 34.0-34.9, adult: Secondary | ICD-10-CM | POA: Diagnosis not present

## 2017-05-05 ENCOUNTER — Other Ambulatory Visit: Payer: Self-pay

## 2017-06-08 ENCOUNTER — Other Ambulatory Visit: Payer: Self-pay | Admitting: Cardiovascular Disease

## 2017-07-04 ENCOUNTER — Encounter: Payer: Self-pay | Admitting: Internal Medicine

## 2017-07-04 ENCOUNTER — Ambulatory Visit (INDEPENDENT_AMBULATORY_CARE_PROVIDER_SITE_OTHER): Payer: Medicare Other | Admitting: Internal Medicine

## 2017-07-04 VITALS — BP 134/72 | HR 87 | Temp 98.2°F | Resp 14 | Ht 65.0 in | Wt 208.4 lb

## 2017-07-04 DIAGNOSIS — E7800 Pure hypercholesterolemia, unspecified: Secondary | ICD-10-CM | POA: Diagnosis not present

## 2017-07-04 DIAGNOSIS — E118 Type 2 diabetes mellitus with unspecified complications: Secondary | ICD-10-CM | POA: Diagnosis not present

## 2017-07-04 DIAGNOSIS — I10 Essential (primary) hypertension: Secondary | ICD-10-CM

## 2017-07-04 DIAGNOSIS — R7989 Other specified abnormal findings of blood chemistry: Secondary | ICD-10-CM | POA: Diagnosis not present

## 2017-07-04 DIAGNOSIS — E213 Hyperparathyroidism, unspecified: Secondary | ICD-10-CM | POA: Diagnosis not present

## 2017-07-04 DIAGNOSIS — M109 Gout, unspecified: Secondary | ICD-10-CM

## 2017-07-04 LAB — HEMOGLOBIN A1C: HEMOGLOBIN A1C: 6.8 % — AB (ref 4.6–6.5)

## 2017-07-04 LAB — BASIC METABOLIC PANEL
BUN: 20 mg/dL (ref 6–23)
CALCIUM: 9.6 mg/dL (ref 8.4–10.5)
CO2: 27 mEq/L (ref 19–32)
CREATININE: 0.85 mg/dL (ref 0.40–1.20)
Chloride: 103 mEq/L (ref 96–112)
GFR: 69.75 mL/min (ref >60.00)
GLUCOSE: 111 mg/dL — AB (ref 70–99)
POTASSIUM: 3.9 meq/L (ref 3.5–5.1)
Sodium: 142 mEq/L (ref 135–145)

## 2017-07-04 LAB — LIPID PANEL
CHOLESTEROL: 159 mg/dL (ref 0–200)
HDL: 41.4 mg/dL (ref >39.00)
NonHDL: 117.49
Total CHOL/HDL Ratio: 4
Triglycerides: 234 mg/dL — ABNORMAL HIGH (ref 0.0–149.0)
VLDL: 46.8 mg/dL — ABNORMAL HIGH (ref 0.0–40.0)

## 2017-07-04 LAB — LDL CHOLESTEROL, DIRECT: Direct LDL: 90 mg/dL

## 2017-07-04 LAB — URIC ACID: URIC ACID, SERUM: 2.5 mg/dL (ref 2.4–7.0)

## 2017-07-04 LAB — TSH: TSH: 1.66 u[IU]/mL (ref 0.35–4.50)

## 2017-07-04 MED ORDER — METOPROLOL SUCCINATE ER 50 MG PO TB24: 50.0000 mg | ORAL_TABLET | Freq: Every day | ORAL | 3 refills | Status: DC

## 2017-07-04 MED ORDER — DILTIAZEM HCL ER COATED BEADS 240 MG PO CP24: 240.0000 mg | ORAL_CAPSULE | Freq: Every day | ORAL | 3 refills | Status: DC

## 2017-07-04 NOTE — Progress Notes (Signed)
Subjective:    Patient ID: Kristy [name], female    DOB: [birth year], 72 y.o.   MRN: [id number]  DOS:  07/04/2017 Type of visit - description : ROV Interval history: DM: Patient is back working, tax season, thus is  slightly more sedentary, not eating as healthy.  Ambulatory CBGs went from the 120s to the 140s. Vertigo: Had another episode several weeks ago, lasted 1 week, symptoms triggered by head motion.  No associated stroke symptoms, see below HTN: Good med compliance, ambulatory BP 120/60 H/o  slightly increased PTH, due for labs  Review of Systems Denies headache, slurred speech, double vision, face numbness or motor deficits Denies chest pain or difficulty breathing No abdominal pain or blood in the stools No anxiety or depression  Past Medical History:  Diagnosis Date  . Allergy    Environmental, foods: shellfish, lactose intolerant, citrus, peanuts  . Asthma    mild persistent  . Atrophic vaginitis   . BRCA gene mutation negative 2015   testing done at [facility]  . DJD (degenerative joint disease)    generalized joint pain, including back  . Family history of malignant neoplasm of breast   . Fibromyalgia    Dr Amil Amen  . GERD (gastroesophageal reflux disease)   . Gout    Idiopathic, chronic  . Hiatal hernia   . History of kidney stones   . Hypertension   . Palpitations    PSVT on Holter monitoring  . Shortness of breath    occ  . SUI (stress urinary incontinence, female)    (resolved after surgery)  . TIA (transient ischemic attack)    07/2012, see Dr Leonie Man  . Vertigo     Past Surgical History:  Procedure Laterality Date  . ABDOMINAL HYSTERECTOMY  1994   TAH,BSO-BLADDER NECK SUSPENSION  . BREAST SURGERY     LUMPECTOMY-BENIGN Breast Bx X2  . CARPAL TUNNEL RELEASE Right 2005  . CATARACT EXTRACTION, BILATERAL Bilateral 2016   Cataract   . CHOLECYSTECTOMY, LAPAROSCOPIC  2001  . CYSTOCELE REPAIR  1994  . KNEE ARTHROPLASTY  05/19/2012   Procedure:  COMPUTER ASSISTED TOTAL KNEE ARTHROPLASTY;  Surgeon: Marybelle Killings, MD;  Location: [place];  Service: Orthopedics;  Laterality: Left;  Left  Total Knee Arthroplasty  . KNEE ARTHROPLASTY Right 09/03/2013   Procedure: COMPUTER ASSISTED TOTAL KNEE ARTHROPLASTY;  Surgeon: Marybelle Killings, MD;  Location: [place];  Service: Orthopedics;  Laterality: Right;  Right Total Knee Arthroplasty, Computer Assist  . KNEE ARTHROSCOPY     bil  . [place] TIBIAL TENDON  2003  . TONSILLECTOMY    . TRIPLE FUSION LEFT ANKLE Left 2007  . TUBAL LIGATION      Social History   Socioeconomic History  . Marital status: Married    Spouse name: [name]  . Number of children: 2  . Years of education: 60  . Highest education level: Not on file  Social Needs  . Financial resource strain: Not on file  . Food insecurity - worry: Not on file  . Food insecurity - inability: Not on file  . Transportation needs - medical: Not on file  . Transportation needs - non-medical: Not on file  Occupational History  . 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 Occupation: works part time, taxes   Tobacco Use  . Smoking status: Never Smoker  . Smokeless tobacco: Never Used  Substance  and Sexual Activity  . Alcohol use: No    Alcohol/week: 0.0 oz  . Drug use: No  . Sexual activity: No    Birth control/protection: Surgical  Other Topics Concern  . 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 married '69. 2 sons - '70, '72: retired - [employer]. SO- good health.   ACP - OK for CPR at least once, no long term ventilation, no heroic measures in the face of poor quality of life. HCPOA - husband, secondary son Sharlie Shreffler (c) [phone]          Allergies as of 07/04/2017      Reactions   Iodine Anaphylaxis   Shellfish Allergy Anaphylaxis   Sulfonamide Derivatives Rash   Other Swelling   Citrus  Sometimes Lactose intolerant   Amoxicillin Diarrhea        Medication List        Accurate as of 07/04/17 11:59 PM. Always use your most recent med list.          aspirin 325 MG tablet Take 1 tablet (325 mg total) by mouth daily.   ASTEPRO 0.15 % Soln Generic drug:  Azelastine HCl Place 2 sprays into the nose daily.   diltiazem 240 MG 24 hr capsule Commonly known as:  CARDIZEM CD Take 1 capsule (240 mg total) by mouth daily.   EPIPEN 0.3 mg/0.3 mL Devi Generic drug:  EPINEPHrine Inject 0.3 mg into the muscle as needed (anaphylactic). Reported on 06/26/2015   estradiol 0.1 MG/GM vaginal cream Commonly known as:  ESTRACE Apply 1 gram per vagina every two times a week   febuxostat 40 MG tablet Commonly known as:  ULORIC Take 80 mg by mouth daily.   FLOVENT HFA 110 MCG/ACT inhaler Generic drug:  fluticasone Take 2 puffs by mouth 2 (two) times daily.   fluticasone 50 MCG/ACT nasal spray Commonly known as:  FLONASE Place 2 sprays into both nostrils daily.   furosemide 40 MG tablet Commonly known as:  LASIX Take 1 tablet (40 mg total) daily by mouth.   HM VITAMIN D3 2000 units Caps Generic drug:  Cholecalciferol Take 2,000 Units by mouth daily.   levalbuterol 45 MCG/ACT inhaler Commonly known as:  XOPENEX HFA Inhale 2 puffs into the lungs every 4 (four) hours as needed for shortness of breath.   levocetirizine 5 MG tablet Commonly known as:  XYZAL Take 5 mg by mouth daily.   losartan 50 MG tablet Commonly known as:  COZAAR Take 1 tablet (50 mg total) every morning by mouth.   metFORMIN 850 MG tablet Commonly known as:  GLUCOPHAGE Take 1 tablet (850 mg total) 2 (two) times daily with a meal by mouth.   metoprolol succinate 50 MG 24 hr tablet Commonly known as:  TOPROL-XL Take 1 tablet (50 mg total) by mouth daily. Take with or immediately following a meal.   nortriptyline 25 MG capsule Commonly known as:  PAMELOR Take 25 mg by mouth at bedtime.   omeprazole 40 MG capsule Commonly known as:  PRILOSEC Take 1  capsule (40 mg total) daily by mouth.   Potassium 99 MG Tabs Take by mouth.   vitamin E 400 UNIT capsule Take 400 Units by mouth daily.          Objective:   Physical Exam BP 134/72 (BP Location: Left Arm, Patient Position: Sitting, Cuff Size: Normal)   Pulse 87   Temp 98.2 F (36.8 C) (Oral)  Resp 14   Ht '5\' 5"'  (1.651 m)   Wt 208 lb 6 oz (94.5 kg)   SpO2 97%   BMI 34.68 kg/m  General:   Well developed, well nourished . NAD.  Neck: No  thyromegaly  HEENT:  Normocephalic . Face symmetric, atraumatic Lungs:  CTA B Normal respiratory effort, no intercostal retractions, no accessory muscle use. Heart: RRR,  no murmur.  No pretibial edema bilaterally  Abdomen:  Not distended, soft, non-tender. No rebound or rigidity.   Skin: Exposed areas without rash. Not pale. Not jaundice Neurologic:  alert & oriented X3.  Speech normal, gait appropriate for age and unassisted Strength symmetric and appropriate for age.  Psych: Cognition and judgment appear intact.  Cooperative with normal attention span and concentration.  Behavior appropriate. No anxious or depressed appearing.     Assessment & Plan:   Assessment DM w/ h/o TIA HTN  Hyperlipidemia  Insomnia  Asthma , allergies-- sees Dr Harold Hedge  GERD, hiatal hernia , h/o IBS Obesity  Palpitations: PSVT by Holter, Dr Gwenlyn Found TIA, dx 2014,last visit w/  Dr. Leonie Man 09-2015, RTC prn Vertigo (improved with vestibular rehabilitation 2015) Increased LFTs --- CT abdomen 2014: NormalLiver, negative hepatitis serologies at rheumatology per patient MSK: --Gout --DJD --Fibromyalgia, Dr. Amil Amen, on Pamelor  --Osteopenia  FH breast cancer , sister is BRCA2 (+), pt is  (-) GU: Kidney stones, h/o; Urinary incontinence; Atrophic vaginitis Hyperparathyroidism h/o slt increased PTH 2015 , normal PTH 05-2014  HOH- aids   PLAN: Preventive care discussed DM: On metformin, CBGs slightly slightly elevated, less active than before,  counseled, check a A1c. HTN: On Cardizem, Lasix, losartan, metoprolol.  Check a BMP.  Ambulatory BPs normal Hyperlipidemia: Diet controlled, check a FLP Vertigo: Had another episode a few weeks ago, call if sxs are getting more frequent or intense. Gout: We will check a uric acid for rheumatolog Hyperparathyroidism, last PTH is slightly increased, rechecking today Slightly increased TSH: TSH today. Palpitations: Has not seen cardiology, refill metoprolol cardiazem?  She is doing well, will refill BBs-CCBs, see cardiology as needed RTC 6 months

## 2017-07-04 NOTE — Patient Instructions (Signed)
GO TO THE LAB : Get the blood work    GO TO THE FRONT DESK Schedule your next appointment for a checkup in 6 months  Please consider see one of our nurses for a wellness exam

## 2017-07-04 NOTE — Progress Notes (Signed)
Pre visit review using our clinic review tool, if applicable. No additional management support is needed unless otherwise documented below in the visit note. 

## 2017-07-04 NOTE — Assessment & Plan Note (Addendum)
-  Td 2012;  Pneumonia shot 2017; Prevnar  2015; had a  flu shot . shingrix d/w pt  - Female care : MMG 07/2016, saw gyn 07/2016 for check up, dx w/ atrophic vaginitis  - CCS: s/p colonoscopy in 2012, colonoscopy 11-2015, next in 5 years - (+)FH  of breast cancer (pt is BRCA negative) and CAD -- Diet and exercise discussed

## 2017-07-05 ENCOUNTER — Telehealth: Payer: Self-pay | Admitting: *Deleted

## 2017-07-05 DIAGNOSIS — E213 Hyperparathyroidism, unspecified: Secondary | ICD-10-CM

## 2017-07-05 LAB — TIQ-NTM

## 2017-07-05 NOTE — Assessment & Plan Note (Signed)
Preventive care discussed DM: On metformin, CBGs slightly slightly elevated, less active than before, counseled, check a A1c. HTN: On Cardizem, Lasix, losartan, metoprolol.  Check a BMP.  Ambulatory BPs normal Hyperlipidemia: Diet controlled, check a FLP Vertigo: Had another episode a few weeks ago, call if sxs are getting more frequent or intense. Gout: We will check a uric acid for rheumatolog Hyperparathyroidism, last PTH is slightly increased, rechecking today Slightly increased TSH: TSH today. Palpitations: Has not seen cardiology, refill metoprolol cardiazem?  She is doing well, will refill BBs-CCBs, see cardiology as needed RTC 6 months

## 2017-07-05 NOTE — Telephone Encounter (Addendum)
Called and spoke with the pt and informed her that we received a fax from [facility] regarding one of the test that was ordered on yesterday.  Informed her that I called [facility] and spoke with a representative regarding the fax that we received,and was told by the representative that they were unable to run the PTH test because the specimen came in not frozen.  Informed the pt that I informed the representative that the specimen was placed in the freezer and was frozen when it was picked up.  Also informed the pt that the representative stated that the specimen could have been messed up during transit, and it got to the lab unfrozen.  Dr. Larose Kells was informed of the mistake and would like for the pt to repeat the PTH test.  Pt verbalized understanding and agreed.  Pt was scheduled a lab appt for Wednesday (07/06/17 @ 3:30pm).  Future lab ordered and sent.//AB/CMA

## 2017-07-06 ENCOUNTER — Other Ambulatory Visit: Payer: Medicare Other

## 2017-07-06 DIAGNOSIS — E213 Hyperparathyroidism, unspecified: Secondary | ICD-10-CM | POA: Diagnosis not present

## 2017-07-06 LAB — PARATHYROID HORMONE, INTACT (NO CA)

## 2017-07-07 LAB — PARATHYROID HORMONE, INTACT (NO CA): PTH: 92 pg/mL — AB (ref 14–64)

## 2017-07-11 NOTE — Addendum Note (Signed)
Addended byDamita Dunnings D on: 07/11/2017 02:13 PM   Modules accepted: Orders

## 2017-09-19 ENCOUNTER — Ambulatory Visit (INDEPENDENT_AMBULATORY_CARE_PROVIDER_SITE_OTHER): Payer: Medicare Other | Admitting: Internal Medicine

## 2017-09-19 VITALS — BP 132/84 | HR 93 | Ht 65.0 in | Wt 198.4 lb

## 2017-09-19 DIAGNOSIS — E213 Hyperparathyroidism, unspecified: Secondary | ICD-10-CM

## 2017-09-19 LAB — VITAMIN D 25 HYDROXY (VIT D DEFICIENCY, FRACTURES): VITD: 30.68 ng/mL (ref 30.00–100.00)

## 2017-09-19 LAB — PHOSPHORUS: Phosphorus: 3.8 mg/dL (ref 2.3–4.6)

## 2017-09-19 NOTE — Patient Instructions (Addendum)
Please stop at the lab.  Continue vitamin D 2000 IU daily.  Please come back for a follow-up appointment in 1 year.

## 2017-09-19 NOTE — Progress Notes (Signed)
Patient ID: Kristy [name], female   DOB: 10/11/1945, 72 y.o.   MRN: [id number]    HPI  Kristy [name] is a 72 y.o.-year-old female, referred by her PCP, Dr. Larose Kells, for evaluation for eucalcemic hyperparathyroidism.  Pt was dx with a high PTH level in 2014.  Of note, her calcium levels have been normal.  I reviewed pt's pertinent labs: Lab Results  Component Value Date   PTH 92 (H) 07/06/2017   PTH CANCELED 07/04/2017   PTH 74 (H) 06/29/2016   PTH 58 06/07/2014   PTH 136.4 (H) 06/05/2013   PTH 125.0 (H) 04/20/2013   CALCIUM 9.6 07/04/2017   CALCIUM 9.4 03/28/2017   CALCIUM 9.7 11/26/2016   CALCIUM 9.8 06/29/2016   CALCIUM 9.8 06/29/2016   CALCIUM 10.1 03/30/2016   CALCIUM 9.7 06/26/2015   CALCIUM 9.5 10/09/2014   CALCIUM 9.7 06/07/2014   CALCIUM 9.7 02/19/2014   I reviewed pt's DXA scans - no OP: Date L1-L4 T score FN T score  03/08/2013 0.0 RFN: -0.5 LFN: -0.7   10/31/2006 -0.4 RFN: +0.4 LFN: -0.8   No fractures, but has dysequilibrium - has BPPV.  + possible distant h/o kidney stones x 1  No h/o CKD. Last BUN/Cr: Lab Results  Component Value Date   BUN 20 07/04/2017   BUN 17 03/28/2017   CREATININE 0.85 07/04/2017   CREATININE 0.86 03/28/2017   Pt is not on HCTZ.  + h/o vitamin D deficiency. Reviewed vit D levels: Lab Results  Component Value Date   VD25OH 42 07/19/2013   VD25OH 23 (L) 04/20/2013   Pt is not on calcium but is on vitamin D 2000 IU - started 2014.  Pt does not have a FH of hypercalcemia, pituitary tumors, thyroid cancer, or osteoporosis.   Pt. also has a history of OA, fibromyalgia, asthma (allergy-related), gout - on Uloric, DM, HTN.  Lab Results  Component Value Date   HGBA1C 6.8 (H) 07/04/2017   ROS: Constitutional: no weight gain/loss, + fatigue, + hot flashes, + poor sleep ia Eyes: no blurry vision, no xerophthalmia ENT: no sore throat, no nodules palpated in throat, no dysphagia/odynophagia, no hoarseness, + decreased hearing, +  tinnitus Cardiovascular: no CP/SOB/palpitations/leg swelling Respiratory: + Both: Cough/SOB Gastrointestinal: no N/V/+ D/+ C/+ heartburn Musculoskeletal: + Both: Muscle/joint aches Skin: no rashes, + easy bruising Neurological: no tremors/numbness/tingling/dizziness Psychiatric: no depression/anxiety  Past Medical History:  Diagnosis Date  . Allergy    Environmental, foods: shellfish, lactose intolerant, citrus, peanuts  . Asthma    mild persistent  . Atrophic vaginitis   . BRCA gene mutation negative 2015   testing done at [facility]  . DJD (degenerative joint disease)    generalized joint pain, including back  . Family history of malignant neoplasm of breast   . Fibromyalgia    Dr Amil Amen  . GERD (gastroesophageal reflux disease)   . Gout    Idiopathic, chronic  . Hiatal hernia   . History of kidney stones   . Hypertension   . Palpitations    PSVT on Holter monitoring  . Shortness of breath    occ  . SUI (stress urinary incontinence, female)    (resolved after surgery)  . TIA (transient ischemic attack)    07/2012, see Dr Leonie Man  . Vertigo    Past Surgical History:  Procedure Laterality Date  . ABDOMINAL HYSTERECTOMY  1994   TAH,BSO-BLADDER NECK SUSPENSION  . BREAST SURGERY     LUMPECTOMY-BENIGN Breast Bx X2  .  CARPAL TUNNEL RELEASE Right 2005  . CATARACT EXTRACTION, BILATERAL Bilateral 2016   Cataract   . CHOLECYSTECTOMY, LAPAROSCOPIC  2001  . CYSTOCELE REPAIR  1994  . KNEE ARTHROPLASTY  05/19/2012   Procedure: COMPUTER ASSISTED TOTAL KNEE ARTHROPLASTY;  Surgeon: Marybelle Killings, MD;  Location: [place];  Service: Orthopedics;  Laterality: Left;  Left  Total Knee Arthroplasty  . KNEE ARTHROPLASTY Right 09/03/2013   Procedure: COMPUTER ASSISTED TOTAL KNEE ARTHROPLASTY;  Surgeon: Marybelle Killings, MD;  Location: [place];  Service: Orthopedics;  Laterality: Right;  Right Total Knee Arthroplasty, Computer Assist  . KNEE ARTHROSCOPY     bil  . [place] TIBIAL TENDON  2003  . TONSILLECTOMY    . TRIPLE FUSION LEFT ANKLE Left 2007  . TUBAL LIGATION     Social History   Socioeconomic History  . Marital status: Married    Spouse name: [name]  . Number of children: 2  . Years of education: 74  . Highest education level: Not on file  Occupational History  . 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 Occupation: works part time, Engineer, manufacturing  . Financial resource strain: Not on file  . Food insecurity:    Worry: Not on file    Inability: Not on file  . Transportation needs:    Medical: Not on file    Non-medical: Not on file  Tobacco Use  . Smoking status: Never Smoker  . Smokeless tobacco: Never Used  Substance and Sexual Activity  . Alcohol use: No    Alcohol/week: 0.0 oz  . Drug use: No  . Sexual activity: Never    Birth control/protection: Surgical  Lifestyle  . Physical activity:    Days per week: Not on file    Minutes per session: Not on file  . Stress: Not on file  Relationships  . Social connections:    Talks on phone: Not on file    Gets together: Not on file    Attends religious service: Not on file    Active member of club or organization: Not on file    Attends meetings of clubs or organizations: Not on file    Relationship status: Not on file  . Intimate partner violence:    Fear of current or ex partner: Not on file    Emotionally abused: Not on file    Physically abused: Not on file    Forced sexual activity: Not on file  Other Topics Concern  . 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 married '69. 2 sons - '70, '72: retired - [employer]. SO- good health.   ACP - OK for CPR at least once, no long term ventilation, no heroic measures in the face of poor quality of life. HCPOA - husband, secondary son Lilyanna Lunt (c) [phone]       Current Outpatient Medications on File Prior to Visit  Medication Sig Dispense Refill  . aspirin  325 MG tablet Take 1 tablet (325 mg total) by mouth daily.    . Azelastine HCl (ASTEPRO) 0.15 % SOLN Place 2 sprays into the nose daily.    . Cholecalciferol (HM VITAMIN D3) 2000 UNITS CAPS Take 2,000 Units by mouth daily.    Marland Kitchen diltiazem (CARDIZEM CD) 240 MG 24 hr capsule Take 1 capsule (240 mg total) by mouth daily. 90 capsule 3  .  EPINEPHrine (EPIPEN) 0.3 mg/0.3 mL DEVI Inject 0.3 mg into the muscle as needed (anaphylactic). Reported on 06/26/2015    . estradiol (ESTRACE) 0.1 MG/GM vaginal cream Apply 1 gram per vagina every two times a week 30 g 12  . febuxostat (ULORIC) 40 MG tablet Take 80 mg by mouth daily.     Marland Kitchen FLOVENT HFA 110 MCG/ACT inhaler Take 2 puffs by mouth 2 (two) times daily.  5  . fluticasone (FLONASE) 50 MCG/ACT nasal spray Place 2 sprays into both nostrils daily.    . furosemide (LASIX) 40 MG tablet Take 1 tablet (40 mg total) daily by mouth. 30 tablet 5  . levalbuterol (XOPENEX HFA) 45 MCG/ACT inhaler Inhale 2 puffs into the lungs every 4 (four) hours as needed for shortness of breath.     . levocetirizine (XYZAL) 5 MG tablet Take 5 mg by mouth daily.    Marland Kitchen losartan (COZAAR) 50 MG tablet Take 1 tablet (50 mg total) every morning by mouth. 90 tablet 1  . metFORMIN (GLUCOPHAGE) 850 MG tablet Take 1 tablet (850 mg total) 2 (two) times daily with a meal by mouth. 180 tablet 1  . metoprolol succinate (TOPROL-XL) 50 MG 24 hr tablet Take 1 tablet (50 mg total) by mouth daily. Take with or immediately following a meal. 90 tablet 3  . nortriptyline (PAMELOR) 25 MG capsule Take 25 mg by mouth at bedtime.      Marland Kitchen omeprazole (PRILOSEC) 40 MG capsule Take 1 capsule (40 mg total) daily by mouth. 30 capsule 5  . Potassium 99 MG TABS Take by mouth.    . vitamin E 400 UNIT capsule Take 400 Units by mouth daily.     No current facility-administered medications on file prior to visit.    Allergies  Allergen Reactions  . Iodine Anaphylaxis  . Shellfish Allergy Anaphylaxis  . Sulfonamide  Derivatives Rash  . Other Swelling    Citrus  Sometimes Lactose intolerant  . Amoxicillin Diarrhea   Family History  Problem Relation Age of Onset  . Breast cancer Mother 23       deceased 78  . Breast cancer Sister 71       bilateral breast ca @ 63; BRCA2 positive  . Hypertension Sister   . Cancer Father        Brain tumor; path?; deceased 68  . Lymphoma Maternal Aunt        deceased 51s  . Hemochromatosis Sister   . Thalassemia Sister   . Other Sister        negative for BRCA1 BRCA2  . Breast cancer Cousin        BRCA2 positive  . Diabetes Other   . Breast cancer Other        distant maternal female relatives with breast cancer  . CAD Son        MI  . Heart failure Sister        due to chemo?  . Colon cancer Neg Hx     PE: BP 132/84   Pulse 93   Ht '5\' 5"'  (1.651 m)   Wt 198 lb 6.4 oz (90 kg)   SpO2 96%   BMI 33.02 kg/m  Wt Readings from Last 3 Encounters:  09/19/17 198 lb 6.4 oz (90 kg)  07/04/17 208 lb 6 oz (94.5 kg)  03/28/17 205 lb 6 oz (93.2 kg)   Constitutional: overweight, in NAD. No kyphosis. Eyes: PERRLA, EOMI, no exophthalmos ENT: moist mucous membranes, no thyromegaly, no  cervical lymphadenopathy Cardiovascular: tachycardia, RR, No MRG Respiratory: CTA B Gastrointestinal: abdomen soft, NT, ND, BS+ Musculoskeletal: no deformities, strength intact in all 4 Skin: moist, warm, no rashes Neurological: no tremor with outstretched hands, DTR normal in all 4  Assessment: 1. Eucalcemic hyperparathyroidism  2.  History of vitamin D deficiency  Plan: 1. Patient has had normal calcium levels, however, with an elevated intact PTH level (highest level 136 in 2015, decreased since). - Patient also  has vitamin D deficiency,  with the last level being normal, however, no recent levels are available. - No apparent complications from hypercalcemia: no h/o nephrolithiasis (she has a distant possible episode based on symptoms, with no stone seen on imaging), no  osteoporosis, no fractures. No abdominal pain, depression, bone pain. - I discussed with the patient about the physiology of calcium and parathyroid hormone, and possible side effects from increased PTH, including kidney stones, osteoporosis, abdominal pain, etc.  - We discussed that we need to check whether her hyperparathyroidism is primary (Familial hypercalcemic hypocalciuria or parathyroid adenoma) or secondary (to conditions like: vitamin D deficiency, calcium malabsorption, hypercalciuria, renal insufficiency, etc.).  My suspicion is for secondary hyperparathyroidism, but we discussed that sometimes primary hyperparathyroidism can present with normal calcium levels. - I discussed with her that we first need to make sure her  vitamin D level is normal  and then we can further investigate the parathyroid status. I explained that in the setting of a low vitamin D, the parathyroid hormone can be elevated, which is not a pathologic finding. However, if the PTH is elevated in the setting of a normal vitamin D, we will further need to investigate her for primary or secondary hyperparathyroidism. -Today we will check calcium level intact PTH ([facility]) Phosphorus vitamin D- 25 HO and 1,25 HO If the labs above are normal with an elevated PTH, we will check: 24h urinary calcium/creatinine ratio - given instructions for urine collection - We discussed possible consequences of hyperparathyroidism: ~1/3 pts will develop complications over 15 years (OP, nephrolithiasis).  However, in the absence of an elevated calcium, the disease is quite mild, so I do not suspect that she will have significant complications over the next 15 years. - She is not meeting the main criteria for parathyroid surgery:  Increased calcium by more than 1 mg/dL above the upper limit of normal  Kidney ds.  Osteoporosis (or Vb fx) Age <58 years old However, newer criteria (2013) also mention: High UCa >400 mg/d and increased stone  risk by biochemical stone risk analysis Presence of nephrolithiasis or nephrocalcinosis Pt's preference - If she has high urinary calcium or nephrocalcinosis she may still be a candidate for surgery.  However, if not, she agrees for expectant management. - I will see the patient back in 6 months  2.  History of vitamin D deficiency -We will recheck vitamin D level today -Continue 2000 units vitamin D daily - I will advise her about vitamin D supplement dose when the results of the vitamin D level are back.  Component     Latest Ref Rng & Units 09/19/2017          Vitamin D 1, 25 (OH) Total     18 - 72 pg/mL 45  Vitamin D3 1, 25 (OH)     pg/mL 45  Vitamin D2 1, 25 (OH)     pg/mL <8  Calcium     8.7 - 10.3 mg/dL 9.6  PTH, Intact     15 -  65 pg/mL 43  VITD     30.00 - 100.00 ng/mL 30.68  Phosphorus     2.3 - 4.6 mg/dL 3.8   All the tests are normal.  No intervention needed for now, but we will recheck her levels when she comes back in 6 months.  Philemon Kingdom, MD PhD [facility]

## 2017-09-20 LAB — PTH, INTACT AND CALCIUM
Calcium: 9.6 mg/dL (ref 8.7–10.3)
PTH: 43 pg/mL (ref 15–65)

## 2017-09-21 LAB — VITAMIN D 1,25 DIHYDROXY
Vitamin D 1, 25 (OH)2 Total: 45 pg/mL (ref 18–72)
Vitamin D2 1, 25 (OH)2: <8 pg/mL
Vitamin D3 1, 25 (OH)2: 45 pg/mL

## 2017-09-22 ENCOUNTER — Encounter: Payer: Self-pay | Admitting: Internal Medicine

## 2017-10-02 ENCOUNTER — Other Ambulatory Visit: Payer: Self-pay | Admitting: Internal Medicine

## 2017-10-06 DIAGNOSIS — J4530 Mild persistent asthma, uncomplicated: Secondary | ICD-10-CM | POA: Diagnosis not present

## 2017-10-06 DIAGNOSIS — J301 Allergic rhinitis due to pollen: Secondary | ICD-10-CM | POA: Diagnosis not present

## 2017-10-06 DIAGNOSIS — J3081 Allergic rhinitis due to animal (cat) (dog) hair and dander: Secondary | ICD-10-CM | POA: Diagnosis not present

## 2017-10-06 DIAGNOSIS — J3089 Other allergic rhinitis: Secondary | ICD-10-CM | POA: Diagnosis not present

## 2017-10-18 DIAGNOSIS — M1A09X0 Idiopathic chronic gout, multiple sites, without tophus (tophi): Secondary | ICD-10-CM | POA: Diagnosis not present

## 2017-10-18 DIAGNOSIS — M5136 Other intervertebral disc degeneration, lumbar region: Secondary | ICD-10-CM | POA: Diagnosis not present

## 2017-10-18 DIAGNOSIS — E669 Obesity, unspecified: Secondary | ICD-10-CM | POA: Diagnosis not present

## 2017-10-18 DIAGNOSIS — R945 Abnormal results of liver function studies: Secondary | ICD-10-CM | POA: Diagnosis not present

## 2017-10-18 DIAGNOSIS — Z6835 Body mass index (BMI) 35.0-35.9, adult: Secondary | ICD-10-CM | POA: Diagnosis not present

## 2017-10-18 DIAGNOSIS — M797 Fibromyalgia: Secondary | ICD-10-CM | POA: Diagnosis not present

## 2017-10-18 DIAGNOSIS — M150 Primary generalized (osteo)arthritis: Secondary | ICD-10-CM | POA: Diagnosis not present

## 2017-10-18 DIAGNOSIS — M65321 Trigger finger, right index finger: Secondary | ICD-10-CM | POA: Diagnosis not present

## 2017-11-09 ENCOUNTER — Other Ambulatory Visit: Payer: Self-pay | Admitting: Internal Medicine

## 2017-11-22 DIAGNOSIS — H8101 Meniere's disease, right ear: Secondary | ICD-10-CM | POA: Diagnosis not present

## 2017-11-22 DIAGNOSIS — R42 Dizziness and giddiness: Secondary | ICD-10-CM | POA: Diagnosis not present

## 2017-11-22 DIAGNOSIS — H903 Sensorineural hearing loss, bilateral: Secondary | ICD-10-CM | POA: Diagnosis not present

## 2017-11-22 DIAGNOSIS — H838X3 Other specified diseases of inner ear, bilateral: Secondary | ICD-10-CM | POA: Diagnosis not present

## 2018-01-02 ENCOUNTER — Ambulatory Visit (INDEPENDENT_AMBULATORY_CARE_PROVIDER_SITE_OTHER): Payer: Medicare Other | Admitting: Internal Medicine

## 2018-01-02 ENCOUNTER — Encounter: Payer: Self-pay | Admitting: Internal Medicine

## 2018-01-02 VITALS — BP 132/74 | HR 104 | Temp 97.7°F | Resp 14 | Ht 65.0 in | Wt 201.4 lb

## 2018-01-02 DIAGNOSIS — I10 Essential (primary) hypertension: Secondary | ICD-10-CM | POA: Diagnosis not present

## 2018-01-02 DIAGNOSIS — R42 Dizziness and giddiness: Secondary | ICD-10-CM

## 2018-01-02 DIAGNOSIS — Z09 Encounter for follow-up examination after completed treatment for conditions other than malignant neoplasm: Secondary | ICD-10-CM

## 2018-01-02 DIAGNOSIS — E118 Type 2 diabetes mellitus with unspecified complications: Secondary | ICD-10-CM

## 2018-01-02 LAB — CBC WITH DIFFERENTIAL/PLATELET
BASOS PCT: 0.6 % (ref 0.0–3.0)
Basophils Absolute: 0.1 10*3/uL (ref 0.0–0.1)
Eosinophils Absolute: 0.2 10*3/uL (ref 0.0–0.7)
Eosinophils Relative: 1.7 % (ref 0.0–5.0)
HCT: 43 % (ref 36.0–46.0)
Hemoglobin: 14.8 g/dL (ref 12.0–15.0)
Lymphocytes Relative: 33.4 % (ref 12.0–46.0)
Lymphs Abs: 3.4 10*3/uL (ref 0.7–4.0)
MCHC: 34.5 g/dL (ref 30.0–36.0)
MCV: 82.7 fl (ref 78.0–100.0)
MONO ABS: 0.9 10*3/uL (ref 0.1–1.0)
Monocytes Relative: 8.8 % (ref 3.0–12.0)
NEUTROS ABS: 5.7 10*3/uL (ref 1.4–7.7)
Neutrophils Relative %: 55.5 % (ref 43.0–77.0)
PLATELETS: 282 10*3/uL (ref 150.0–400.0)
RBC: 5.2 Mil/uL — ABNORMAL HIGH (ref 3.87–5.11)
RDW: 14.4 % (ref 11.5–15.5)
WBC: 10.3 10*3/uL (ref 4.0–10.5)

## 2018-01-02 LAB — BASIC METABOLIC PANEL
BUN: 20 mg/dL (ref 6–23)
CALCIUM: 10 mg/dL (ref 8.4–10.5)
CO2: 29 mEq/L (ref 19–32)
Chloride: 98 mEq/L (ref 96–112)
Creatinine, Ser: 0.94 mg/dL (ref 0.40–1.20)
GFR: 62.02 mL/min (ref >60.00)
GLUCOSE: 126 mg/dL — AB (ref 70–99)
POTASSIUM: 3.8 meq/L (ref 3.5–5.1)
SODIUM: 141 meq/L (ref 135–145)

## 2018-01-02 LAB — HEMOGLOBIN A1C: Hgb A1c MFr Bld: 6.7 % — ABNORMAL HIGH (ref 4.6–6.5)

## 2018-01-02 NOTE — Progress Notes (Signed)
Pre visit review using our clinic review tool, if applicable. No additional management support is needed unless otherwise documented below in the visit note. 

## 2018-01-02 NOTE — Progress Notes (Addendum)
Subjective:    Patient ID: Kristy [name], female    DOB: [birth year], 72 y.o.   MRN: [id number]  DOS:  01/02/2018 Type of visit - description : rov Interval history: HTN: Ambulatory BPs in the 120s DM: On metformin, ambulatory CBGs in the 120s, never more than 130. Dizziness: Chronic issue, states is not any better and continue to bother her.  Symptoms are clearly trigger by lying down on the right side or moving her head.  Last visit with the ENT Dr. Arnette Norris was in June. Since the last visit with me she had 2 falls related to the vertigo. She has a walker and cane and uses it sometimes.    Review of Systems Denies chest pain, palpitations or LOC. No diplopia, slurred speech or facial numbness. Occasional nausea associated with dizziness.  Past Medical History:  Diagnosis Date  . Allergy    Environmental, foods: shellfish, lactose intolerant, citrus, peanuts  . Asthma    mild persistent  . Atrophic vaginitis   . BRCA gene mutation negative 2015   testing done at [facility]  . DJD (degenerative joint disease)    generalized joint pain, including back  . Family history of malignant neoplasm of breast   . Fibromyalgia    Dr Amil Amen  . GERD (gastroesophageal reflux disease)   . Gout    Idiopathic, chronic  . Hiatal hernia   . History of kidney stones   . Hypertension   . Palpitations    PSVT on Holter monitoring  . Shortness of breath    occ  . SUI (stress urinary incontinence, female)    (resolved after surgery)  . TIA (transient ischemic attack)    07/2012, see Dr Leonie Man  . Vertigo     Past Surgical History:  Procedure Laterality Date  . ABDOMINAL HYSTERECTOMY  1994   TAH,BSO-BLADDER NECK SUSPENSION  . BREAST SURGERY     LUMPECTOMY-BENIGN Breast Bx X2  . CARPAL TUNNEL RELEASE Right 2005  . CATARACT EXTRACTION, BILATERAL Bilateral 2016   Cataract   . CHOLECYSTECTOMY, LAPAROSCOPIC  2001  . CYSTOCELE REPAIR  1994  . KNEE ARTHROPLASTY  05/19/2012   Procedure:  COMPUTER ASSISTED TOTAL KNEE ARTHROPLASTY;  Surgeon: Marybelle Killings, MD;  Location: [place];  Service: Orthopedics;  Laterality: Left;  Left  Total Knee Arthroplasty  . KNEE ARTHROPLASTY Right 09/03/2013   Procedure: COMPUTER ASSISTED TOTAL KNEE ARTHROPLASTY;  Surgeon: Marybelle Killings, MD;  Location: [place];  Service: Orthopedics;  Laterality: Right;  Right Total Knee Arthroplasty, Computer Assist  . KNEE ARTHROSCOPY     bil  . [place] TIBIAL TENDON  2003  . TONSILLECTOMY    . TRIPLE FUSION LEFT ANKLE Left 2007  . TUBAL LIGATION      Social History   Socioeconomic History  . Marital status: Married    Spouse name: [name]  . Number of children: 2  . Years of education: 22  . Highest education level: Not on file  Occupational History  . 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 Occupation: works part time, Engineer, manufacturing  . Financial resource strain: Not on file  . Food insecurity:    Worry: Not on file    Inability: Not on file  . Transportation needs:    Medical: Not on file    Non-medical: Not on file  Tobacco Use  . Smoking status: Never  Smoker  . Smokeless tobacco: Never Used  Substance and Sexual Activity  . Alcohol use: No    Alcohol/week: 0.0 oz  . Drug use: No  . Sexual activity: Not Currently    Birth control/protection: Surgical  Lifestyle  . Physical activity:    Days per week: Not on file    Minutes per session: Not on file  . Stress: Not on file  Relationships  . Social connections:    Talks on phone: Not on file    Gets together: Not on file    Attends religious service: Not on file    Active member of club or organization: Not on file    Attends meetings of clubs or organizations: Not on file    Relationship status: Not on file  . Intimate partner violence:    Fear of current or ex partner: Not on file    Emotionally abused: Not on file    Physically abused: Not on file    Forced sexual  activity: Not on file  Other Topics Concern  . 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 married '69. 2 sons - '70, '72: retired - [employer]. SO- good health.   ACP - OK for CPR at least once, no long term ventilation, no heroic measures in the face of poor quality of life. HCPOA - husband, secondary son Kanisha Duba (c) [phone]          Allergies as of 01/02/2018      Reactions   Iodine Anaphylaxis   Shellfish Allergy Anaphylaxis   Sulfonamide Derivatives Rash   Other Swelling   Citrus  Sometimes Lactose intolerant   Amoxicillin Diarrhea      Medication List        Accurate as of 01/02/18  8:02 PM. Always use your most recent med list.          aspirin 325 MG tablet Take 1 tablet (325 mg total) by mouth daily.   ASTEPRO 0.15 % Soln Generic drug:  Azelastine HCl Place 2 sprays into the nose daily.   diltiazem 240 MG 24 hr capsule Commonly known as:  CARDIZEM CD Take 1 capsule (240 mg total) by mouth daily.   EPIPEN 0.3 mg/0.3 mL Devi Generic drug:  EPINEPHrine Inject 0.3 mg into the muscle as needed (anaphylactic). Reported on 06/26/2015   estradiol 0.1 MG/GM vaginal cream Commonly known as:  ESTRACE Apply 1 gram per vagina every two times a week   febuxostat 40 MG tablet Commonly known as:  ULORIC Take 80 mg by mouth daily.   FLOVENT HFA 110 MCG/ACT inhaler Generic drug:  fluticasone Take 2 puffs by mouth 2 (two) times daily.   fluticasone 50 MCG/ACT nasal spray Commonly known as:  FLONASE Place 2 sprays into both nostrils daily.   furosemide 40 MG tablet Commonly known as:  LASIX Take 1 tablet (40 mg total) by mouth daily.   HM VITAMIN D3 2000 units Caps Generic drug:  Cholecalciferol Take 2,000 Units by mouth daily.   levalbuterol 45 MCG/ACT inhaler Commonly known as:  XOPENEX HFA Inhale 2 puffs into the lungs every 4 (four) hours as needed for shortness of breath.   levocetirizine 5 MG tablet Commonly known as:   XYZAL Take 5 mg by mouth daily.   losartan 50 MG tablet Commonly known as:  COZAAR Take 1 tablet (50 mg total) by mouth every morning.   metFORMIN 850 MG tablet Commonly known as:  GLUCOPHAGE Take 1 tablet (850 mg total) by mouth 2 (two) times daily with a meal.   metoprolol succinate 50 MG 24 hr tablet Commonly known as:  TOPROL-XL Take 1 tablet (50 mg total) by mouth daily. Take with or immediately following a meal.   nortriptyline 25 MG capsule Commonly known as:  PAMELOR Take 25 mg by mouth at bedtime.   omeprazole 40 MG capsule Commonly known as:  PRILOSEC Take 1 capsule (40 mg total) by mouth daily.   Potassium 99 MG Tabs Take by mouth.   vitamin E 400 UNIT capsule Take 400 Units by mouth daily.          Objective:   Physical Exam BP 132/74 (BP Location: Left Arm, Patient Position: Sitting, Cuff Size: Small)   Pulse (!) 104   Temp 97.7 F (36.5 C) (Oral)   Resp 14   Ht _0  (1.651 m)   Wt 201 lb 6 oz (91.3 kg)   SpO2 97%   BMI 33.51 kg/m  General:   Well developed, NAD, see BMI.  HEENT:  Normocephalic . Face symmetric, atraumatic Lungs:  CTA B Normal respiratory effort, no intercostal retractions, no accessory muscle use. Heart: RRR,  no murmur.  No pretibial edema bilaterally  Skin: Not pale. Not jaundice Neurologic:  alert & oriented X3.  Speech normal, gait appropriate for age and unassisted EOMI DTR symmetric except for decrease right knee jerk (had surgery on the knee). Roemberg tests: Positive? Psych--  Cognition and judgment appear intact.  Cooperative with normal attention span and concentration.  Behavior appropriate. No anxious or depressed appearing.      Assessment & Plan:  Assessment DM w/ h/o TIA HTN  Hyperlipidemia  Insomnia  Asthma , allergies-- sees Dr Harold Hedge  GERD, hiatal hernia , h/o IBS Obesity  Palpitations: PSVT by Holter, Dr Gwenlyn Found TIA, dx 2014,last visit w/  Dr. Leonie Man 09-2015, RTC prn Vertigo (improved  with vestibular rehabilitation 2015) Asymmetric right ear sensorineural hearing loss with previously normal MRI.  Sees Dr. Benjamine Mola regularly. Increased LFTs --- CT abdomen 2014: NormalLiver, negative hepatitis serologies at rheumatology per patient [facility]: --Gout --DJD --Fibromyalgia, Dr. Amil Amen, on Pamelor  --Osteopenia  FH breast cancer , sister is BRCA2 (+), pt is  (-) GU: Kidney stones, h/o; Urinary incontinence; Atrophic vaginitis Hyperparathyroidism h/o slt increased PTH 2015 , normal PTH 05-2014  HOH- aids   PLAN: DM: On metformin, check A1c HTN is well controlled on Cardizem, Lasix, losartan.  Check a BMP and CBC Vertigo: Chronic, ongoing issue, last visit with ENT Dr. Arnette Norris June 2019.  No new recommendations provided.  Patient is bothered by her symptoms, neurological exam essentially symmetric except for a question of + Romberg. Not seen neurology in 3 years.  History of TIA.  No evidence of overcontrolled DM or HTN. Plan: Vestibular rehab, refer to neurology for a checkup regards dizzines.  Encourage use of cane or walker. Addendum: Multiple notes from ENT reviewed, she is seen regularly there, dx- asymmetric right ear sensorineural hearing loss.   Last visit 10/2017, possibly Mnire's disease? Hyperparathyroidism: Saw endocrinology 4/ 2019. RTC 6 months

## 2018-01-02 NOTE — Patient Instructions (Addendum)
GO TO THE LAB : Get the blood work     GO TO THE FRONT DESK Schedule your next appointment for a checkup in 6 months  Referrals: Neurology Physical therapy at [facility]   Please use your cane or walker consistently to prevent falls

## 2018-01-02 NOTE — Assessment & Plan Note (Addendum)
DM: On metformin, check A1c HTN is well controlled on Cardizem, Lasix, losartan.  Check a BMP and CBC Vertigo: Chronic, ongoing issue, last visit with ENT Dr. Arnette Norris June 2019.  No new recommendations provided.  Patient is bothered by her symptoms, neurological exam essentially symmetric except for a question of + Romberg. Not seen neurology in 3 years.  History of TIA.  No evidence of overcontrolled DM or HTN. Plan: Vestibular rehab, refer to neurology for a checkup regards dizzines.  Encourage use of cane or walker. Addendum: Multiple notes from ENT reviewed, she is seen regularly there, dx- asymmetric right ear sensorineural hearing loss.   Last visit 10/2017, possibly Mnire's disease? Hyperparathyroidism: Saw endocrinology 4/ 2019. RTC 6 months

## 2018-01-05 DIAGNOSIS — R42 Dizziness and giddiness: Secondary | ICD-10-CM | POA: Diagnosis not present

## 2018-01-05 DIAGNOSIS — R262 Difficulty in walking, not elsewhere classified: Secondary | ICD-10-CM | POA: Diagnosis not present

## 2018-01-05 DIAGNOSIS — H8111 Benign paroxysmal vertigo, right ear: Secondary | ICD-10-CM | POA: Diagnosis not present

## 2018-01-05 DIAGNOSIS — R2681 Unsteadiness on feet: Secondary | ICD-10-CM | POA: Diagnosis not present

## 2018-01-09 ENCOUNTER — Telehealth: Payer: Self-pay | Admitting: *Deleted

## 2018-01-09 DIAGNOSIS — H8111 Benign paroxysmal vertigo, right ear: Secondary | ICD-10-CM | POA: Diagnosis not present

## 2018-01-09 DIAGNOSIS — R42 Dizziness and giddiness: Secondary | ICD-10-CM | POA: Diagnosis not present

## 2018-01-09 DIAGNOSIS — R262 Difficulty in walking, not elsewhere classified: Secondary | ICD-10-CM | POA: Diagnosis not present

## 2018-01-09 DIAGNOSIS — R2681 Unsteadiness on feet: Secondary | ICD-10-CM | POA: Diagnosis not present

## 2018-01-09 NOTE — Telephone Encounter (Addendum)
Received Physician Orders from [facility] PT; will forward to provider upon RTO Mon, 01/18/18//SLS 08/12

## 2018-01-12 DIAGNOSIS — R2681 Unsteadiness on feet: Secondary | ICD-10-CM | POA: Diagnosis not present

## 2018-01-12 DIAGNOSIS — R262 Difficulty in walking, not elsewhere classified: Secondary | ICD-10-CM | POA: Diagnosis not present

## 2018-01-12 DIAGNOSIS — R42 Dizziness and giddiness: Secondary | ICD-10-CM | POA: Diagnosis not present

## 2018-01-12 DIAGNOSIS — H8111 Benign paroxysmal vertigo, right ear: Secondary | ICD-10-CM | POA: Diagnosis not present

## 2018-01-16 DIAGNOSIS — H8111 Benign paroxysmal vertigo, right ear: Secondary | ICD-10-CM | POA: Diagnosis not present

## 2018-01-16 DIAGNOSIS — R2681 Unsteadiness on feet: Secondary | ICD-10-CM | POA: Diagnosis not present

## 2018-01-16 DIAGNOSIS — R42 Dizziness and giddiness: Secondary | ICD-10-CM | POA: Diagnosis not present

## 2018-01-16 DIAGNOSIS — R262 Difficulty in walking, not elsewhere classified: Secondary | ICD-10-CM | POA: Diagnosis not present

## 2018-01-17 NOTE — Telephone Encounter (Signed)
Orders signed on PCP return to office- form faxed to [facility] PT at [phone]. Form sent for scanning.

## 2018-01-19 DIAGNOSIS — R262 Difficulty in walking, not elsewhere classified: Secondary | ICD-10-CM | POA: Diagnosis not present

## 2018-01-19 DIAGNOSIS — R42 Dizziness and giddiness: Secondary | ICD-10-CM | POA: Diagnosis not present

## 2018-01-19 DIAGNOSIS — H8111 Benign paroxysmal vertigo, right ear: Secondary | ICD-10-CM | POA: Diagnosis not present

## 2018-01-19 DIAGNOSIS — R2681 Unsteadiness on feet: Secondary | ICD-10-CM | POA: Diagnosis not present

## 2018-01-23 DIAGNOSIS — R262 Difficulty in walking, not elsewhere classified: Secondary | ICD-10-CM | POA: Diagnosis not present

## 2018-01-23 DIAGNOSIS — R42 Dizziness and giddiness: Secondary | ICD-10-CM | POA: Diagnosis not present

## 2018-01-23 DIAGNOSIS — R2681 Unsteadiness on feet: Secondary | ICD-10-CM | POA: Diagnosis not present

## 2018-01-23 DIAGNOSIS — H8111 Benign paroxysmal vertigo, right ear: Secondary | ICD-10-CM | POA: Diagnosis not present

## 2018-01-25 ENCOUNTER — Other Ambulatory Visit: Payer: Self-pay | Admitting: Internal Medicine

## 2018-01-26 DIAGNOSIS — H8111 Benign paroxysmal vertigo, right ear: Secondary | ICD-10-CM | POA: Diagnosis not present

## 2018-01-26 DIAGNOSIS — R2681 Unsteadiness on feet: Secondary | ICD-10-CM | POA: Diagnosis not present

## 2018-01-26 DIAGNOSIS — R262 Difficulty in walking, not elsewhere classified: Secondary | ICD-10-CM | POA: Diagnosis not present

## 2018-01-26 DIAGNOSIS — R42 Dizziness and giddiness: Secondary | ICD-10-CM | POA: Diagnosis not present

## 2018-01-27 DIAGNOSIS — H5213 Myopia, bilateral: Secondary | ICD-10-CM | POA: Diagnosis not present

## 2018-01-27 DIAGNOSIS — Z961 Presence of intraocular lens: Secondary | ICD-10-CM | POA: Diagnosis not present

## 2018-01-27 DIAGNOSIS — H52223 Regular astigmatism, bilateral: Secondary | ICD-10-CM | POA: Diagnosis not present

## 2018-01-27 DIAGNOSIS — E119 Type 2 diabetes mellitus without complications: Secondary | ICD-10-CM | POA: Diagnosis not present

## 2018-01-27 DIAGNOSIS — H43819 Vitreous degeneration, unspecified eye: Secondary | ICD-10-CM | POA: Diagnosis not present

## 2018-01-27 DIAGNOSIS — H524 Presbyopia: Secondary | ICD-10-CM | POA: Diagnosis not present

## 2018-01-27 DIAGNOSIS — H1045 Other chronic allergic conjunctivitis: Secondary | ICD-10-CM | POA: Diagnosis not present

## 2018-01-27 LAB — HM DIABETES EYE EXAM

## 2018-02-07 ENCOUNTER — Encounter: Payer: Self-pay | Admitting: Neurology

## 2018-02-07 ENCOUNTER — Ambulatory Visit (INDEPENDENT_AMBULATORY_CARE_PROVIDER_SITE_OTHER): Payer: Medicare Other | Admitting: Neurology

## 2018-02-07 VITALS — BP 119/72 | HR 99 | Ht 65.0 in | Wt 205.6 lb

## 2018-02-07 DIAGNOSIS — R42 Dizziness and giddiness: Secondary | ICD-10-CM | POA: Diagnosis not present

## 2018-02-07 DIAGNOSIS — R292 Abnormal reflex: Secondary | ICD-10-CM

## 2018-02-07 NOTE — Progress Notes (Signed)
Neurology Condult Note PATIENT: Kristy [name] DOB: [birth year]  REASON FOR VISIT: dizziness Referring MD : Dr Larose Kells HISTORY FROM: patient  HISTORY OF PRESENT ILLNESS:  72 year-old Caucasian lady seen for office consultation visit for dizziness and decreased reflexes in the right side. Patient states she has a long-standing history of benign paroxysmal positional vertigo and over the years has had some spells of dizziness. She states that the about 4 weeks ago she had a severe episode where she was dizzy for several days which is unusual for her. She was more dizzy when she moves a certain way. She is since persuading outpatient physical therapy and has gotten vestibular rehabilitation with canalicular repositioning maneuver which seems to help significantly. She is no longer feeling as dizzy and can walk better. She denies any nausea vertigo and hearing loss. She denies any decreased hearing or tinnitus.She denies any focal stroke like symptoms with accompanying vertigo, diplopia, slurred speech, extremity weakness numbness..She has been seen in the past in my office for TIA since 2014 but had done well and was discharged back to primary care physician in March 2017 and has not followed up since then.  She was admitted on 07/14/12 with sudden onset of numbness involving the left face and slurred speech following a brief occipital headache. Her headache and speech symptoms resolved but left facial numbness persisted. NIH stroke scale on admission was 0. CT scan was unremarkable. MRI scan did not show any acute infarct. MRA of the brain showed no large vessel stenosis. Lipid profile was normal hemoglobin A1c was borderline at 6.4%. Transthoracic echo showed normal ejection fraction without cardiac source of embolism. MRA of the neck showed only mild plaque without hemodynamically significant stenosis. She was started on aspirin for stroke prevention. She denied history of migraine headaches. She states she  has done well since discharge; her headache and speech problems have completely resolved. She states her blood pressure has been under good control and it is 128/7 in office today. She has no new complaints. She plans to exercise regularly and start going to a swimming pool.  UPDATE 03/01/13 (LL): [name] comes in for TIA followup. She has had no recurrent TIA symptoms, but occasionally gets a brief sharp pain in her head. She has an area on her left cheek that she says has decreased sensation since the TIA. She has difficulty with dizziness and motion sickness, and sometimes falls for no reason. She sometimes uses a cane to help with balance. She denies weakness in her legs but has had total knee replacement on the left and needs the right side done, plans are for next month. She states her left ankle has 4 screws in it as well. She states her blood pressure is well controlled, is 142/83 in office today.   UPDATE 11/28/13 (LL):  Since last visit, patient had right total knee replacement, without complication.  Since surgery she has been much more mobile and can walk much longer distances.  She still feels somewhat off-balance at times. She has had no recurrent neurovascular symptoms.  Blood pressure is slightly elevated today at 151/86.  She is tolerating aspirin well with no signs of significant bleeding or bruising.  She has had more palpitations lately and wore a holter monitor 48 hours last week but does not know the results.  Her HR is 109 in the office today, sitting down.  Otherwise, she feels well. Update 07/31/2014 ( PS) : She returns for follow-up after last visit  6 months ago. She continues to do well from neurovascular standpoint without recurrent stroke or TIA symptoms. She is tolerating aspirin well without significant bleeding or bruising. She states her blood pressure is usually in the 120s to 130s. She does however complain of some side effects from of tiredness, blood pressure medicines. She has  not been able to exercise regularly and has not lost any weight. She complains of her right ear being blocked and not hearing well despite hearing aids and having a feeling of pressure behind the ears. She wonders whether this could be related to allergies and I advised her to see her primary care physician to evaluate this further. She has no new complaints today. She has not been regularly checking her fasting sugar. Her last hemoglobin A1c was 7.1 checked last month. Update 07/31/15 : She returns for follow-up after last visit a year ago. She continues to do well from neurovascular standpoint without recurrent TIA or stroke symptoms now for 3 years. She had a couple of occasions of brief and headaches which responded well to aspirin. She also complains of occasional transient dizziness and feeling of off balance. This may occur after she has been sitting or lying down but occasionally may occur even when she is walking. She denies any tingling numbness pain or burning in her feet. She feels her diabetes is well controlled though she cannot tell me when the last A1c was checked. Her blood pressure is well controlled and today it is 137/90. She is tolerating aspirin well without bleeding or bruising. She does see a primary care physician regularly.  ROS:  14 system review of systems is positive for  dizziness,  Allergies and all other systems negative    ALLERGIES: Allergies  Allergen Reactions  . Iodine Anaphylaxis  . Shellfish Allergy Anaphylaxis  . Sulfonamide Derivatives Rash  . Other Swelling    Citrus  Sometimes Lactose intolerant  . Amoxicillin Diarrhea    HOME MEDICATIONS: Outpatient Medications Prior to Visit  Medication Sig Dispense Refill  . aspirin 325 MG tablet Take 1 tablet (325 mg total) by mouth daily.    . Azelastine HCl (ASTEPRO) 0.15 % SOLN Place 2 sprays into the nose daily.    . Cholecalciferol (HM VITAMIN D3) 2000 UNITS CAPS Take 2,000 Units by mouth daily.    Marland Kitchen diltiazem  (CARDIZEM CD) 240 MG 24 hr capsule Take 1 capsule (240 mg total) by mouth daily. 90 capsule 3  . EPINEPHrine (EPIPEN) 0.3 mg/0.3 mL DEVI Inject 0.3 mg into the muscle as needed (anaphylactic). Reported on 06/26/2015    . estradiol (ESTRACE) 0.1 MG/GM vaginal cream Apply 1 gram per vagina every two times a week 30 g 12  . febuxostat (ULORIC) 40 MG tablet Take 80 mg by mouth daily.     Marland Kitchen FLOVENT HFA 110 MCG/ACT inhaler Take 2 puffs by mouth 2 (two) times daily.  5  . fluticasone (FLONASE) 50 MCG/ACT nasal spray Place 2 sprays into both nostrils daily.    . furosemide (LASIX) 40 MG tablet TAKE 1 TABLET BY MOUTH EVERY DAY 30 tablet 5  . levalbuterol (XOPENEX HFA) 45 MCG/ACT inhaler Inhale 2 puffs into the lungs every 4 (four) hours as needed for shortness of breath.     . levocetirizine (XYZAL) 5 MG tablet Take 5 mg by mouth daily.    Marland Kitchen losartan (COZAAR) 50 MG tablet Take 1 tablet (50 mg total) by mouth every morning. 90 tablet 1  . metFORMIN (GLUCOPHAGE) 850 MG  tablet Take 1 tablet (850 mg total) by mouth 2 (two) times daily with a meal. 180 tablet 1  . metoprolol succinate (TOPROL-XL) 50 MG 24 hr tablet Take 1 tablet (50 mg total) by mouth daily. Take with or immediately following a meal. 90 tablet 3  . nortriptyline (PAMELOR) 25 MG capsule Take 25 mg by mouth at bedtime.      Marland Kitchen omeprazole (PRILOSEC) 40 MG capsule TAKE 1 CAPSULE BY MOUTH EVERY DAY 30 capsule 5  . Potassium 99 MG TABS Take by mouth.    . vitamin E 400 UNIT capsule Take 400 Units by mouth daily.     No facility-administered medications prior to visit.     PHYSICAL EXAM Vitals:   02/07/18 1459  BP: 119/72  Pulse: 99  Weight: 205 lb 9.6 oz (93.3 kg)  Height: 5\' 5"  (1.651 m)   Body mass index is 34.21 kg/m.  General: Obese middle-aged Caucasian lady, seated, in no evident distress  Head: head normocephalic and atraumatic.   Neck: supple with no carotid or supraclavicular bruits  Cardiovascular: regular rate and rhythm, no  murmurs  Musculoskeletal: no deformity  Skin: no rash/petichiae  Vascular: Normal pulses all extremities  Neurologic Exam  Mental Status: Awake and fully alert. Oriented to place and time. Recent and remote memory intact. Attention span, concentration and fund of knowledge appropriate. Mood and affect appropriate.  Cranial Nerves: Pupils equal, briskly reactive to light. Extraocular movements full without nystagmus. Visual fields full to confrontation. Hearing intact. Facial sensation intact. Face, tongue, palate moves normally and symmetrically.  Motor: Normal bulk and tone. Normal strength in all tested extremity muscles.  Sensory.: intact to tough and pinprick and vibratory.  Coordination: Rapid alternating movements normal in all extremities. Finger-to-nose and heel-to-shin performed accurately bilaterally. Fukuda stepping test is abnormal with patient clearly unable to hold base and head shaking does not result in any nystagmus or dizziness Gait and Station: Arises from chair without difficulty. Stance is normal. Gait demonstrates normal stride length and balance . Unable to heel, toe and tandem walk without difficulty.  Reflexes: 1+ and asymmetric With a right biceps jerk and bilateral knee jerks being depressed. Toes downgoing.   MRI HEAD WITHOUT AND WITH CONTRAST 07/15/12  Mild atrophy. Mild to moderate chronic microvascular ischemic change. No acute stroke. No abnormal enhancement.  MRA HEAD WITHOUT CONTRAST 07/15/12  Unremarkable MR angiography intracranial circulation.  MRA NECK WITHOUT AND WITH CONTRAST 07/15/12  Mild posterior wall plaque right internal carotid artery origin, without flow reducing stenosis or dissection. Otherwise unremarkable study.   ASSESSMENT AND PLAN  72 year lady with  Recent episode of dizziness likely related to peripheral vestibular dysfunction from long-standing history of benign paroxysmal positional vertigo. She also has remote history of  right brain  TIA in February 2014 likely due to small vessel disease. Vascular risk factors of hypertension, age and sex only.   PLAN:   I had a long discussion the patient with regards to her dizziness which likely represents benign paroxysmal positional vertigo and vestibular dysfunction. I encouraged her to continue to do regular breast to establish exercise is also discussed fall and safety prevention precautions.She has diminished right bicep and knee jerks  And in the absence of significant weakness or sensory findings this isof uncertain significance and do not recommend further workup for this at the present time. Continue aspirin for stroke prevention given prior history of TIA and maintain strict control of hypertension with blood pressure goal below 130/90,  diabetes with hemoglobin A1c goal below 6.5% and lipids with LDL cholesterol goal below 70 mg percent. Greater than 50% time during this 45 minute consultation visit was spent on counseling and coordination of care about her dizziness, paroxysmal positional vertigo and remote TIA discussionNo routine scheduled follow-up appointment is necessary but she may be referred back in the future as needed.  Return if symptoms worsen or fail to improve.  Antony Contras, MD  02/07/2018, 4:58 PM [facility] [address], [place], [place] [address] [phone]  Note: This document was prepared with digital dictation and possible smart phrase technology. Any transcriptional errors that result from this process are unintentional.

## 2018-02-07 NOTE — Patient Instructions (Signed)
I had a long discussion the patient with regards to her dizziness which likely represents benign paroxysmal positional vertigo and vestibular dysfunction. I encouraged her to continue to do regular breast to establish exercise is also discussed fall and safety prevention precautions.She has diminished right bicep and knee jerks  And in the absence of significant weakness or sensory findings this isof uncertain significance and do not recommend further workup for this at the present time. Continue aspirin for stroke prevention given prior history of TIA and maintain strict control of hypertension with blood pressure goal below 130/90, diabetes with hemoglobin A1c goal below 6.5% and lipids with LDL cholesterol goal below 70 mg percent. No routine scheduled follow-up appointment is necessary but she may be referred back in the future as needed.    Fall Prevention in the Home Falls can cause injuries. They can happen to people of all ages. There are many things you can do to make your home safe and to help prevent falls. What can I do on the outside of my home?  Regularly fix the edges of walkways and driveways and fix any cracks.  Remove anything that might make you trip as you walk through a door, such as a raised step or threshold.  Trim any bushes or trees on the path to your home.  Use bright outdoor lighting.  Clear any walking paths of anything that might make someone trip, such as rocks or tools.  Regularly check to see if handrails are loose or broken. Make sure that both sides of any steps have handrails.  Any raised decks and porches should have guardrails on the edges.  Have any leaves, snow, or ice cleared regularly.  Use sand or salt on walking paths during winter.  Clean up any spills in your garage right away. This includes oil or grease spills. What can I do in the bathroom?  Use night lights.  Install grab bars by the toilet and in the tub and shower. Do not use towel  bars as grab bars.  Use non-skid mats or decals in the tub or shower.  If you need to sit down in the shower, use a plastic, non-slip stool.  Keep the floor dry. Clean up any water that spills on the floor as soon as it happens.  Remove soap buildup in the tub or shower regularly.  Attach bath mats securely with double-sided non-slip rug tape.  Do not have throw rugs and other things on the floor that can make you trip. What can I do in the bedroom?  Use night lights.  Make sure that you have a light by your bed that is easy to reach.  Do not use any sheets or blankets that are too big for your bed. They should not hang down onto the floor.  Have a firm chair that has side arms. You can use this for support while you get dressed.  Do not have throw rugs and other things on the floor that can make you trip. What can I do in the kitchen?  Clean up any spills right away.  Avoid walking on wet floors.  Keep items that you use a lot in easy-to-reach places.  If you need to reach something above you, use a strong step stool that has a grab bar.  Keep electrical cords out of the way.  Do not use floor polish or wax that makes floors slippery. If you must use wax, use non-skid floor wax.  Do not have  throw rugs and other things on the floor that can make you trip. What can I do with my stairs?  Do not leave any items on the stairs.  Make sure that there are handrails on both sides of the stairs and use them. Fix handrails that are broken or loose. Make sure that handrails are as long as the stairways.  Check any carpeting to make sure that it is firmly attached to the stairs. Fix any carpet that is loose or worn.  Avoid having throw rugs at the top or bottom of the stairs. If you do have throw rugs, attach them to the floor with carpet tape.  Make sure that you have a light switch at the top of the stairs and the bottom of the stairs. If you do not have them, ask someone to  add them for you. What else can I do to help prevent falls?  Wear shoes that: ? Do not have high heels. ? Have rubber bottoms. ? Are comfortable and fit you well. ? Are closed at the toe. Do not wear sandals.  If you use a stepladder: ? Make sure that it is fully opened. Do not climb a closed stepladder. ? Make sure that both sides of the stepladder are locked into place. ? Ask someone to hold it for you, if possible.  Clearly mark and make sure that you can see: ? Any grab bars or handrails. ? First and last steps. ? Where the edge of each step is.  Use tools that help you move around (mobility aids) if they are needed. These include: ? Canes. ? Walkers. ? Scooters. ? Crutches.  Turn on the lights when you go into a dark area. Replace any light bulbs as soon as they burn out.  Set up your furniture so you have a clear path. Avoid moving your furniture around.  If any of your floors are uneven, fix them.  If there are any pets around you, be aware of where they are.  Review your medicines with your doctor. Some medicines can make you feel dizzy. This can increase your chance of falling. Ask your doctor what other things that you can do to help prevent falls. This information is not intended to replace advice given to you by your health care provider. Make sure you discuss any questions you have with your health care provider. Document Released: 03/13/2009 Document Revised: 10/23/2015 Document Reviewed: 06/21/2014 Elsevier Interactive Patient Education  [employer].

## 2018-03-09 ENCOUNTER — Ambulatory Visit (INDEPENDENT_AMBULATORY_CARE_PROVIDER_SITE_OTHER): Payer: Medicare Other

## 2018-03-09 DIAGNOSIS — Z23 Encounter for immunization: Secondary | ICD-10-CM

## 2018-04-13 DIAGNOSIS — J3081 Allergic rhinitis due to animal (cat) (dog) hair and dander: Secondary | ICD-10-CM | POA: Diagnosis not present

## 2018-04-13 DIAGNOSIS — J301 Allergic rhinitis due to pollen: Secondary | ICD-10-CM | POA: Diagnosis not present

## 2018-04-13 DIAGNOSIS — J3089 Other allergic rhinitis: Secondary | ICD-10-CM | POA: Diagnosis not present

## 2018-04-13 DIAGNOSIS — J4530 Mild persistent asthma, uncomplicated: Secondary | ICD-10-CM | POA: Diagnosis not present

## 2018-04-20 DIAGNOSIS — M797 Fibromyalgia: Secondary | ICD-10-CM | POA: Diagnosis not present

## 2018-04-20 DIAGNOSIS — M150 Primary generalized (osteo)arthritis: Secondary | ICD-10-CM | POA: Diagnosis not present

## 2018-04-20 DIAGNOSIS — E669 Obesity, unspecified: Secondary | ICD-10-CM | POA: Diagnosis not present

## 2018-04-20 DIAGNOSIS — M1A09X0 Idiopathic chronic gout, multiple sites, without tophus (tophi): Secondary | ICD-10-CM | POA: Diagnosis not present

## 2018-04-20 DIAGNOSIS — M5136 Other intervertebral disc degeneration, lumbar region: Secondary | ICD-10-CM | POA: Diagnosis not present

## 2018-04-20 DIAGNOSIS — Z6834 Body mass index (BMI) 34.0-34.9, adult: Secondary | ICD-10-CM | POA: Diagnosis not present

## 2018-04-20 DIAGNOSIS — M65321 Trigger finger, right index finger: Secondary | ICD-10-CM | POA: Diagnosis not present

## 2018-04-20 DIAGNOSIS — R945 Abnormal results of liver function studies: Secondary | ICD-10-CM | POA: Diagnosis not present

## 2018-04-30 ENCOUNTER — Other Ambulatory Visit: Payer: Self-pay | Admitting: Internal Medicine

## 2018-05-01 ENCOUNTER — Other Ambulatory Visit: Payer: Self-pay | Admitting: Internal Medicine

## 2018-05-04 ENCOUNTER — Encounter: Payer: Self-pay | Admitting: Internal Medicine

## 2018-06-29 ENCOUNTER — Other Ambulatory Visit: Payer: Self-pay | Admitting: Internal Medicine

## 2018-07-10 ENCOUNTER — Encounter: Payer: Self-pay | Admitting: Internal Medicine

## 2018-07-10 ENCOUNTER — Ambulatory Visit (INDEPENDENT_AMBULATORY_CARE_PROVIDER_SITE_OTHER): Payer: Medicare Other | Admitting: Internal Medicine

## 2018-07-10 VITALS — BP 132/70 | HR 101 | Temp 98.1°F | Resp 16 | Ht 65.0 in | Wt 204.2 lb

## 2018-07-10 DIAGNOSIS — Z1231 Encounter for screening mammogram for malignant neoplasm of breast: Secondary | ICD-10-CM | POA: Diagnosis not present

## 2018-07-10 DIAGNOSIS — M109 Gout, unspecified: Secondary | ICD-10-CM

## 2018-07-10 DIAGNOSIS — E1169 Type 2 diabetes mellitus with other specified complication: Secondary | ICD-10-CM

## 2018-07-10 DIAGNOSIS — E038 Other specified hypothyroidism: Secondary | ICD-10-CM

## 2018-07-10 DIAGNOSIS — I10 Essential (primary) hypertension: Secondary | ICD-10-CM | POA: Diagnosis not present

## 2018-07-10 DIAGNOSIS — E039 Hypothyroidism, unspecified: Secondary | ICD-10-CM | POA: Diagnosis not present

## 2018-07-10 DIAGNOSIS — Z8673 Personal history of transient ischemic attack (TIA), and cerebral infarction without residual deficits: Secondary | ICD-10-CM

## 2018-07-10 DIAGNOSIS — E7800 Pure hypercholesterolemia, unspecified: Secondary | ICD-10-CM

## 2018-07-10 LAB — COMPREHENSIVE METABOLIC PANEL
ALT: 28 U/L (ref 0–35)
AST: 18 U/L (ref 0–37)
Albumin: 4.5 g/dL (ref 3.5–5.2)
Alkaline Phosphatase: 74 U/L (ref 39–117)
BUN: 16 mg/dL (ref 6–23)
CHLORIDE: 101 meq/L (ref 96–112)
CO2: 27 mEq/L (ref 19–32)
CREATININE: 0.88 mg/dL (ref 0.40–1.20)
Calcium: 9.8 mg/dL (ref 8.4–10.5)
GFR: 62.88 mL/min (ref >60.00)
Glucose, Bld: 123 mg/dL — ABNORMAL HIGH (ref 70–99)
Potassium: 4.7 mEq/L (ref 3.5–5.1)
Sodium: 139 mEq/L (ref 135–145)
Total Bilirubin: 0.7 mg/dL (ref 0.2–1.2)
Total Protein: 7.1 g/dL (ref 6.0–8.3)

## 2018-07-10 LAB — LIPID PANEL
CHOL/HDL RATIO: 5
Cholesterol: 160 mg/dL (ref 0–200)
HDL: 34.3 mg/dL — ABNORMAL LOW (ref >39.00)
NonHDL: 126.09
Triglycerides: 304 mg/dL — ABNORMAL HIGH (ref 0.0–149.0)
VLDL: 60.8 mg/dL — ABNORMAL HIGH (ref 0.0–40.0)

## 2018-07-10 LAB — URIC ACID: Uric Acid, Serum: 4.7 mg/dL (ref 2.4–7.0)

## 2018-07-10 LAB — HEMOGLOBIN A1C: Hgb A1c MFr Bld: 7 % — ABNORMAL HIGH (ref 4.6–6.5)

## 2018-07-10 LAB — TSH: TSH: 3.99 u[IU]/mL (ref 0.35–4.50)

## 2018-07-10 LAB — LDL CHOLESTEROL, DIRECT: Direct LDL: 94 mg/dL

## 2018-07-10 NOTE — Patient Instructions (Addendum)
Please schedule Medicare Wellness with Angel.   GO TO THE LAB : Get the blood work     GO TO THE FRONT DESK Schedule your next appointment   for a checkup in 6 months    

## 2018-07-10 NOTE — Progress Notes (Signed)
Pre visit review using our clinic review tool, if applicable. No additional management support is needed unless otherwise documented below in the visit note. 

## 2018-07-10 NOTE — Progress Notes (Signed)
Subjective:    Patient ID: Kristy [name], female    DOB: 1945-12-23, 73 y.o.   MRN: [id number]  DOS:  07/10/2018 Type of visit - description: ROV DM good med compliance, CBGs slt higher than last year Dizziness: Note from neurology reviewed DM: Good compliance with medication, ambulatory CBGs a little higher than usual, in the 130s, 140s. HTN: BPs when checked normal. Gout: No recent episodes, due for uric acid check. High cholesterol: Due for labs per chart review   Review of Systems Denies chest pain, difficulty breathing.  No edema Occasional palpitations, overall improved with metoprolol. Developed viral syndrome 3 to 4 days ago, feeling slightly better today.  Started with nausea, vomiting, diarrhea.  That is better but now she has some nasal congestion.  No fever or chills  Past Medical History:  Diagnosis Date  . Allergy    Environmental, foods: shellfish, lactose intolerant, citrus, peanuts  . Asthma    mild persistent  . Atrophic vaginitis   . BRCA gene mutation negative 2015   [facility]  . DJD (degenerative joint disease)    generalized joint pain, including back  . Family history of malignant neoplasm of breast   . Fibromyalgia    Dr Amil Amen  . GERD (gastroesophageal reflux disease)   . Gout    Idiopathic, chronic  . Hiatal hernia   . History of kidney stones   . Hypertension   . Palpitations    PSVT on Holter monitoring  . Shortness of breath    occ  . Stroke ([place])   . SUI (stress urinary incontinence, female)    (resolved after surgery)  . TIA (transient ischemic attack)    07/2012, see Dr Leonie Man  . Vertigo     Past Surgical History:  Procedure Laterality Date  . ABDOMINAL HYSTERECTOMY  1994   TAH,BSO-BLADDER NECK SUSPENSION  . BREAST SURGERY     LUMPECTOMY-BENIGN Breast Bx X2  . CARPAL TUNNEL RELEASE Right 2005  . CATARACT EXTRACTION, BILATERAL Bilateral 2016   Cataract   . CHOLECYSTECTOMY, LAPAROSCOPIC  2001  . CYSTOCELE  REPAIR  1994  . KNEE ARTHROPLASTY  05/19/2012   Procedure: COMPUTER ASSISTED TOTAL KNEE ARTHROPLASTY;  Surgeon: Marybelle Killings, MD;  Location: [place];  Service: Orthopedics;  Laterality: Left;  Left  Total Knee Arthroplasty  . KNEE ARTHROPLASTY Right 09/03/2013   Procedure: COMPUTER ASSISTED TOTAL KNEE ARTHROPLASTY;  Surgeon: Marybelle Killings, MD;  Location: [place];  Service: Orthopedics;  Laterality: Right;  Right Total Knee Arthroplasty, Computer Assist  . KNEE ARTHROSCOPY     bil  . [place] TIBIAL TENDON  2003  . TONSILLECTOMY    . TRIPLE FUSION LEFT ANKLE Left 2007  . TUBAL LIGATION      Social History   Socioeconomic History  . Marital status: Married    Spouse name: [name]  . Number of children: 2  . Years of education: 83  . Highest education level: Not on file  Occupational History  . 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 Occupation: works part time, Engineer, manufacturing  . Financial resource strain: Not on file  . Food insecurity:    Worry: Not on file    Inability: Not on file  . Transportation needs:    Medical: Not on file    Non-medical: Not on file  Tobacco Use  . Smoking  status: Never Smoker  . Smokeless tobacco: Never Used  Substance and Sexual Activity  . Alcohol use: No    Alcohol/week: 0.0 standard drinks  . Drug use: No  . Sexual activity: Not Currently    Birth control/protection: Surgical  Lifestyle  . Physical activity:    Days per week: Not on file    Minutes per session: Not on file  . Stress: Not on file  Relationships  . Social connections:    Talks on phone: Not on file    Gets together: Not on file    Attends religious service: Not on file    Active member of club or organization: Not on file    Attends meetings of clubs or organizations: Not on file    Relationship status: Not on file  . Intimate partner violence:    Fear of current or ex partner: Not on file    Emotionally  abused: Not on file    Physically abused: Not on file    Forced sexual activity: Not on file  Other Topics Concern  . 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 married '69. 2 sons - '70, '72: retired - [employer]. SO- good health.   ACP - OK for CPR at least once, no long term ventilation, no heroic measures in the face of poor quality of life. HCPOA - husband, secondary son Shanautica Forker (c) [phone]          Allergies as of 07/10/2018      Reactions   Iodine Anaphylaxis   Shellfish Allergy Anaphylaxis   Sulfonamide Derivatives Rash   Other Swelling   Citrus  Sometimes Lactose intolerant   Amoxicillin Diarrhea      Medication List       Accurate as of July 10, 2018 11:59 PM. Always use your most recent med list.        aspirin 325 MG tablet Take 1 tablet (325 mg total) by mouth daily.   ASTEPRO 0.15 % Soln Generic drug:  Azelastine HCl Place 2 sprays into the nose daily.   diltiazem 240 MG 24 hr capsule Commonly known as:  CARDIZEM CD Take 1 capsule (240 mg total) by mouth daily.   EPIPEN 0.3 mg/0.3 mL Devi Generic drug:  EPINEPHrine Inject 0.3 mg into the muscle as needed (anaphylactic). Reported on 06/26/2015   estradiol 0.1 MG/GM vaginal cream Commonly known as:  ESTRACE Apply 1 gram per vagina every two times a week   febuxostat 40 MG tablet Commonly known as:  ULORIC Take 80 mg by mouth daily.   FLOVENT HFA 110 MCG/ACT inhaler Generic drug:  fluticasone Take 2 puffs by mouth 2 (two) times daily.   fluticasone 50 MCG/ACT nasal spray Commonly known as:  FLONASE Place 2 sprays into both nostrils daily.   furosemide 40 MG tablet Commonly known as:  LASIX TAKE 1 TABLET BY MOUTH EVERY DAY   HM VITAMIN D3 50 MCG (2000 UT) Caps Generic drug:  Cholecalciferol Take 2,000 Units by mouth daily.   levalbuterol 45 MCG/ACT inhaler Commonly known as:  XOPENEX HFA Inhale 2 puffs into the lungs every 4 (four) hours as needed for  shortness of breath.   levocetirizine 5 MG tablet Commonly known as:  XYZAL Take 5 mg by mouth daily.   losartan 25 MG tablet Commonly known as:  COZAAR Take 2 tablets (50 mg total) by mouth daily.   metFORMIN 850 MG tablet Commonly known as:  GLUCOPHAGE Take 1 tablet (850 mg total) by mouth 2 (two) times daily with a meal.   metoprolol succinate 50 MG 24 hr tablet Commonly known as:  TOPROL-XL Take 1 tablet (50 mg total) by mouth daily. Take with or immediately following a meal.   nortriptyline 25 MG capsule Commonly known as:  PAMELOR Take 25 mg by mouth at bedtime.   omeprazole 40 MG capsule Commonly known as:  PRILOSEC TAKE 1 CAPSULE BY MOUTH EVERY DAY   Potassium 99 MG Tabs Take by mouth.   vitamin E 400 UNIT capsule Take 400 Units by mouth daily.           Objective:   Physical Exam BP 132/70 (BP Location: Left Arm, Patient Position: Sitting, Cuff Size: Normal)   Pulse (!) 101   Temp 98.1 F (36.7 C) (Oral)   Resp 16   Ht '5\' 5"'  (1.651 m)   Wt 204 lb 4 oz (92.6 kg)   SpO2 96%   BMI 33.99 kg/m  General:   Well developed, NAD, BMI noted. HEENT:  Normocephalic . Face symmetric, atraumatic.  Nose is slightly congested. Lungs:  CTA B Normal respiratory effort, no intercostal retractions, no accessory muscle use. Heart: RRR,  no murmur.  No pretibial edema bilaterally Diabetes foot exam: Normal pedal pulses, no edema, pinprick examination normal Skin: Not pale. Not jaundice Neurologic:  alert & oriented X3.  Speech normal, gait appropriate for age and unassisted Psych--  Cognition and judgment appear intact.  Cooperative with normal attention span and concentration.  Behavior appropriate. No anxious or depressed appearing.      Assessment     Assessment DM w/ h/o TIA HTN  Hyperlipidemia  Insomnia  Asthma , allergies-- sees Dr Harold Hedge  GERD, hiatal hernia , h/o IBS Obesity  Palpitations: PSVT by Holter, Dr Gwenlyn Found TIA, dx 2014,last  visit w/  Dr. Leonie Man 09-2015, RTC prn Vertigo (improved with vestibular rehabilitation 2015) Asymmetric right ear sensorineural hearing loss with previously normal MRI.  Sees Dr. Benjamine Mola regularly. Increased LFTs --- CT abdomen 2014: NormalLiver, negative hepatitis serologies at rheumatology per patient MSK: --Gout --DJD --Fibromyalgia, Dr. Amil Amen, on [place]  --Osteopenia  FH breast cancer , sister is BRCA2 (+), pt is  (-) GU: Kidney stones, h/o; Urinary incontinence; Atrophic vaginitis Hyperparathyroidism h/o slt increased PTH 2015 , normal PTH 05-2014  HOH- aids   PLAN: DM: Currently on metformin, check A1c.  Ambulatory CBGs in the 130s, 140s.  Foot exam normal HTN: Continue Cardizem, Lasix, losartan, metoprolol, potassium supplements.  Check CMP High cholesterol: Diet controlled, with history of TIA LDL should be around 70. Vertigo, TIA: Neurology note reviewed, symptoms are likely BPV, also needs a strict CV RF control. Gout: On Uloric.  Check a uric acid, no recent events. Viral syndrome: Exam benign, mild symptoms, RTC 6 months

## 2018-07-11 NOTE — Assessment & Plan Note (Signed)
DM: Currently on metformin, check A1c.  Ambulatory CBGs in the 130s, 140s.  Foot exam normal HTN: Continue Cardizem, Lasix, losartan, metoprolol, potassium supplements.  Check CMP High cholesterol: Diet controlled, with history of TIA LDL should be around 70. Vertigo, TIA: Neurology note reviewed, symptoms are likely BPV, also needs a strict CV RF control. Gout: On Uloric.  Check a uric acid, no recent events. Viral syndrome: Exam benign, mild symptoms, RTC 6 months

## 2018-07-13 MED ORDER — ATORVASTATIN CALCIUM 10 MG PO TABS: 10.0000 mg | ORAL_TABLET | Freq: Every day | ORAL | 1 refills | Status: DC

## 2018-07-13 MED ORDER — METFORMIN HCL 850 MG PO TABS: ORAL_TABLET | ORAL | 1 refills | Status: DC

## 2018-07-13 NOTE — Addendum Note (Signed)
Addended byDamita Dunnings D on: 07/13/2018 09:12 AM   Modules accepted: Orders

## 2018-07-29 ENCOUNTER — Other Ambulatory Visit: Payer: Self-pay | Admitting: Internal Medicine

## 2018-08-02 ENCOUNTER — Other Ambulatory Visit: Payer: Self-pay | Admitting: Internal Medicine

## 2018-08-19 ENCOUNTER — Ambulatory Visit (HOSPITAL_COMMUNITY)
Admission: EM | Admit: 2018-08-19 | Discharge: 2018-08-19 | Disposition: A | Discharge status: Home / Self Care | Payer: Medicare Other | Attending: Family Medicine | Admitting: Family Medicine

## 2018-08-19 ENCOUNTER — Other Ambulatory Visit: Payer: Self-pay

## 2018-08-19 ENCOUNTER — Ambulatory Visit (INDEPENDENT_AMBULATORY_CARE_PROVIDER_SITE_OTHER): Payer: Medicare Other

## 2018-08-19 ENCOUNTER — Telehealth: Payer: Medicare Other | Admitting: Physician Assistant

## 2018-08-19 ENCOUNTER — Encounter (HOSPITAL_COMMUNITY): Payer: Self-pay

## 2018-08-19 DIAGNOSIS — R0602 Shortness of breath: Secondary | ICD-10-CM

## 2018-08-19 DIAGNOSIS — R05 Cough: Secondary | ICD-10-CM

## 2018-08-19 DIAGNOSIS — J4521 Mild intermittent asthma with (acute) exacerbation: Secondary | ICD-10-CM

## 2018-08-19 DIAGNOSIS — R059 Cough, unspecified: Secondary | ICD-10-CM

## 2018-08-19 DIAGNOSIS — R0600 Dyspnea, unspecified: Secondary | ICD-10-CM

## 2018-08-19 MED ORDER — PREDNISONE 10 MG (21) PO TBPK: ORAL_TABLET | Freq: Every day | ORAL | 0 refills | Status: DC

## 2018-08-19 NOTE — ED Triage Notes (Signed)
Pt presents today with cough, sob, weakness, tiredness, and congestion since Monday. States the highest her temp has been was 99.4. No wheezing. Does have hx of asthma and tried using inhaler today with not much relief.

## 2018-08-19 NOTE — Progress Notes (Signed)
Based on what you shared with me, I feel your condition warrants further evaluation and I recommend that you be seen for a face to face office visit.  Your symptoms do not seem likely to be related to coronavirus. However giving respiratory symptoms and feeling you can't catch your breath, you need to be seen at Urgent Care today. If symptoms are severe, please seek care at an ER.    NOTE: If you entered your credit card information for this eVisit, you will not be charged. You may see a "hold" on your card for the $35 but that hold will drop off and you will not have a charge processed.  If you are having a true medical emergency please call 911.  If you need an urgent face to face visit, [facility] has four urgent care centers for your convenience.    PLEASE NOTE: THE [facility] LOCATIONS AND URGENT CARE CLINICS DO NOT HAVE THE TESTING FOR CORONAVIRUS COVID19 AVAILABLE.  IF YOU FEEL YOU NEED THIS TEST YOU MUST HAVE AN ORDER TO GO TO A TESTING LOCATION FROM YOUR PROVIDER OR FROM A SCREENING E-VISIT     [email or web address] to reserve your spot online an avoid wait times  [facility] [address] [place], [place] [address] 8 am to 8 pm Monday-Friday 10 am to 4 pm Saturday-Sunday *Across the street from [employer]  [place], [address] 8 am to 5 pm Monday-Friday * In the [facility] on the [facility]   The following sites will take your insurance:  . [facility] [place] a Provider at this Location  [address] [place], [place] [address] . 10 am to 8 pm Monday-Friday . 12 pm to 8 pm Saturday-Sunday   . [facility] [place] a Provider at this Location  [address], [place], [place] [address] . 8 am to 8 pm Monday-Friday . 9 am to 6 pm Saturday . 11 am  to 6 pm Sunday   . [facility] at [place] Get Driving Directions  [address] [place], [place] [address] . 8 am to 8 pm Monday-Friday . 8 am to 4 pm Saturday-Sunday   Your e-visit answers were reviewed by a board certified advanced clinical practitioner to complete your personal care plan.  Thank you for using e-Visits.

## 2018-08-21 ENCOUNTER — Encounter: Payer: Self-pay | Admitting: Internal Medicine

## 2018-08-22 NOTE — ED Provider Notes (Signed)
[facility]   [id number] 08/19/18 Arrival Time: 1540  ASSESSMENT & PLAN:  1. SOB (shortness of breath)   2. Cough   3. Mild intermittent asthma with exacerbation    Discussed chest imaging. Will defer at this point. Likely viral URI trigger with wheezing.  Trial of: Meds ordered this encounter  Medications  . predniSONE (STERAPRED UNI-PAK 21 TAB) 10 MG (21) TBPK tablet    Sig: Take by mouth daily. Take as directed.    Dispense:  21 tablet    Refill:  0   Worsening signs and symptoms discussed and patient verbalized understanding.   Follow-up Information    [facility] EMERGENCY [facility]PARTMENT.   Specialty:  Emergency Medicine Why:  Should your symptoms worsen. Contact information: [address] [id number] [place] [address] [phone]         Reviewed expectations re: course of current medical issues. Questions answered. Outlined signs and symptoms indicating need for more acute intervention. Patient verbalized understanding. After Visit Summary given.   SUBJECTIVE: History from: patient.  Kristy [name] is a 72 y.o. female who presents with complaint of feeling SOB. Mild cold symptoms for a few days. Abrupt onset. History of asthma and feels she is wheezing frequently, esp with dry cough. Inhaler with minimal relief. Afebrile. No chest pain. SOB worse with exertion and better with rest.  Overall fatigued. No LE edema. Normal PO intake without n/v. Ambulatory without difficulty. Reports that she has never smoked. She has never used smokeless tobacco.  ROS: As per HPI. All other systems negative.   OBJECTIVE:  Vitals:   08/19/18 1604  BP: (!) 145/101  Pulse: (!) 118  Resp: 18  Temp: 98.1 F (36.7 C)  TempSrc: Oral  SpO2: 97%    General appearance: alert, oriented, no acute distress Eyes: PERRLA; EOMI; conjunctivae normal HENT: normocephalic; atraumatic Neck: supple with FROM Lungs: without labored  respirations; moving air symmetrically with moderate expiratory wheezing; dry cough Heart: mild tachycardia; regular rhythm Chest Wall: without tenderness to palpation Abdomen: soft, non-tender; bowel sounds normal; no masses or organomegaly; no guarding or rebound tenderness Extremities: without edema; without calf swelling or tenderness; symmetrical without gross deformities Skin: warm and dry; without rash or lesions Psychological: alert and cooperative; normal mood and affect   Allergies  Allergen Reactions  . Iodine Anaphylaxis  . Shellfish Allergy Anaphylaxis  . Sulfonamide Derivatives Rash  . Other Swelling    Citrus  Sometimes Lactose intolerant  . Amoxicillin Diarrhea    Past Medical History:  Diagnosis Date  . Allergy    Environmental, foods: shellfish, lactose intolerant, citrus, peanuts  . Asthma    mild persistent  . Atrophic vaginitis   . BRCA gene mutation negative 2015   testing done at [place]  . DJD (degenerative joint disease)    generalized joint pain, including back  . Family history of malignant neoplasm of breast   . Fibromyalgia    Dr Beekman  . GERD (gastroesophageal reflux disease)   . Gout    Idiopathic, chronic  . Hiatal hernia   . History of kidney stones   . Hypertension   . Palpitations    PSVT on Holter monitoring  . Shortness of breath    occ  . Stroke (HCC)   . SUI (stress urinary incontinence, female)    (resolved after surgery)  . TIA (transient ischemic attack)    07/2012, see Dr Sethi  . Vertigo    Social History     Socioeconomic History  . Marital status: Married    Spouse name: Jerrry  . Number of children: 2  . Years of education: 16  . Highest education level: Not on file  Occupational History  . 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 Occupation: works part time, taxes   Social Needs  . Financial resource strain: Not on file  . Food insecurity:    Worry: Not on file    Inability: Not  on file  . Transportation needs:    Medical: Not on file    Non-medical: Not on file  Tobacco Use  . Smoking status: Never Smoker  . Smokeless tobacco: Never Used  Substance and Sexual Activity  . Alcohol use: No    Alcohol/week: 0.0 standard drinks  . Drug use: No  . Sexual activity: Not Currently    Birth control/protection: Surgical  Lifestyle  . Physical activity:    Days per week: Not on file    Minutes per session: Not on file  . Stress: Not on file  Relationships  . Social connections:    Talks on phone: Not on file    Gets together: Not on file    Attends religious service: Not on file    Active member of club or organization: Not on file    Attends meetings of clubs or organizations: Not on file    Relationship status: Not on file  . Intimate partner violence:    Fear of current or ex partner: Not on file    Emotionally abused: Not on file    Physically abused: Not on file    Forced sexual activity: Not on file  Other Topics Concern  . 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 married '69. 2 sons - '70, '72: retired - [employer]. SO- good health.   ACP - OK for CPR at least once, no long term ventilation, no heroic measures in the face of poor quality of life. HCPOA - husband, secondary son Jody Zirbel (c) [phone]       Family History  Problem Relation Age of Onset  . Breast cancer Mother 42       deceased 66  . Breast cancer Sister 52       bilateral breast ca @ 60; BRCA2 positive  . Hypertension Sister   . Cancer Father        Brain tumor; path?; deceased 66  . Lymphoma Maternal Aunt        deceased 60s  . Hemochromatosis Sister   . Thalassemia Sister   . Other Sister        negative for BRCA1 BRCA2  . Breast cancer Cousin        BRCA2 positive  . Diabetes Other   . Breast cancer Other        distant maternal female relatives with breast cancer  . CAD Son        MI  . Heart failure Sister        due to chemo?  .  Colon cancer Neg Hx    Past Surgical History:  Procedure Laterality Date  . ABDOMINAL HYSTERECTOMY  1994   TAH,BSO-BLADDER NECK SUSPENSION  . BREAST SURGERY     LUMPECTOMY-BENIGN Breast Bx X2  . CARPAL TUNNEL RELEASE Right 2005  . CATARACT EXTRACTION, BILATERAL Bilateral 2016   Cataract   . CHOLECYSTECTOMY, LAPAROSCOPIC  2001  . CYSTOCELE REPAIR  1994  .   KNEE ARTHROPLASTY  05/19/2012   Procedure: COMPUTER ASSISTED TOTAL KNEE ARTHROPLASTY;  Surgeon: Mark C Yates, MD;  Location: [facility] OR;  Service: Orthopedics;  Laterality: Left;  Left  Total Knee Arthroplasty  . KNEE ARTHROPLASTY Right 09/03/2013   Procedure: COMPUTER ASSISTED TOTAL KNEE ARTHROPLASTY;  Surgeon: Mark C Yates, MD;  Location: [facility];  Service: Orthopedics;  Laterality: Right;  Right Total Knee Arthroplasty, Computer Assist  . KNEE ARTHROSCOPY     bil  . OOPHORECTOMY     BSO  . SURGERY FOR POSERIOR TIBIAL TENDON  2003  . TONSILLECTOMY    . TRIPLE FUSION LEFT ANKLE Left 2007  . TUBAL LIGATION       Hagler, Brian, MD 08/23/18 0856  

## 2018-08-25 ENCOUNTER — Other Ambulatory Visit: Payer: Self-pay

## 2018-08-25 ENCOUNTER — Ambulatory Visit (INDEPENDENT_AMBULATORY_CARE_PROVIDER_SITE_OTHER): Payer: Medicare Other | Admitting: Internal Medicine

## 2018-08-25 ENCOUNTER — Encounter: Payer: Self-pay | Admitting: Internal Medicine

## 2018-08-25 DIAGNOSIS — J4521 Mild intermittent asthma with (acute) exacerbation: Secondary | ICD-10-CM

## 2018-08-25 DIAGNOSIS — E1169 Type 2 diabetes mellitus with other specified complication: Secondary | ICD-10-CM | POA: Diagnosis not present

## 2018-08-25 DIAGNOSIS — E7800 Pure hypercholesterolemia, unspecified: Secondary | ICD-10-CM | POA: Diagnosis not present

## 2018-08-25 NOTE — Telephone Encounter (Signed)
Spoke w/ Pt- virtual visit set up for 3pm.

## 2018-08-25 NOTE — Progress Notes (Signed)
Subjective:    Patient ID: Kristy [name], female    DOB: 1946/04/27, 73 y.o.   MRN: [id number]  DOS:  08/25/2018 Type of visit - description:  Virtual Visit via Video Note  I connected with Kristy [name] on 08/28/18 at  3:00 PM EDT by a video enabled telemedicine application and verified that I am speaking with the correct person using two identifiers.   THIS ENCOUNTER IS A VIRTUAL VISIT DUE TO COVID-19 - PATIENT WAS NOT SEEN IN THE OFFICE. PATIENT HAS CONSENTED TO VIRTUAL VISIT / TELEMEDICINE VISIT   Location of patient: home  Location of provider: office   I discussed the limitations of evaluation and management by telemedicine and the availability of in person appointments. The patient expressed understanding and agreed to proceed.     Symptoms started approximately 08/13/2018: Cough, shortness of breath, DOE. No fever but she did have occasional chills. Went to urgent care, a chest x-ray was negative, she was felt to have asthma exacerbation, was prescribed prednisone.  At this point, the DOE has decreased, cough has decreased. She has been checking her temperature and T-max has been 99.4. She just finished prednisone today. She was using her rescue inhaler frequently but did not need it this week. We also talk about diabetes and high cholesterol   Review of Systems   Past Medical History:  Diagnosis Date   Allergy    Environmental, foods: shellfish, lactose intolerant, citrus, peanuts   Asthma    mild persistent   Atrophic vaginitis    BRCA gene mutation negative 2015   testing done at [place]   DJD (degenerative joint disease)    generalized joint pain, including back   Family history of malignant neoplasm of breast    Fibromyalgia    Dr Kristy [name]   GERD (gastroesophageal reflux disease)    Gout    Idiopathic, chronic   Hiatal hernia    History of kidney stones    Hypertension    Palpitations    PSVT on Holter monitoring   Shortness of  breath    occ   Stroke [facility])    SUI (stress urinary incontinence, female)    (resolved after surgery)   TIA (transient ischemic attack)    07/2012, see Dr Kristy [name]   Vertigo     Past Surgical History:  Procedure Laterality Date   ABDOMINAL HYSTERECTOMY  1994   TAH,BSO-BLADDER NECK SUSPENSION   BREAST SURGERY     LUMPECTOMY-BENIGN Breast Bx X2   CARPAL TUNNEL RELEASE Right 2005   CATARACT EXTRACTION, BILATERAL Bilateral 2016   Cataract    CHOLECYSTECTOMY, LAPAROSCOPIC  2001   Kristy [name]   KNEE ARTHROPLASTY  05/19/2012   Procedure: COMPUTER ASSISTED TOTAL KNEE ARTHROPLASTY;  Surgeon: Kristy Killings, MD;  Location: [name];  Service: Orthopedics;  Laterality: Left;  Left  Total Knee Arthroplasty   KNEE ARTHROPLASTY Right 09/03/2013   Procedure: COMPUTER ASSISTED TOTAL KNEE ARTHROPLASTY;  Surgeon: Kristy Killings, MD;  Location: [name];  Service: Orthopedics;  Laterality: Right;  Right Total Knee Arthroplasty, Computer Assist   KNEE ARTHROSCOPY     bil   OOPHORECTOMY     BSO   SURGERY FOR POSERIOR TIBIAL TENDON  2003   TONSILLECTOMY     TRIPLE FUSION LEFT ANKLE Left 2007   TUBAL LIGATION      Social History   Socioeconomic History   Marital status: Married    Spouse name: Kristy [name]   Number  of children: 2   Years of education: 16   Highest education level: Not on file  Occupational History   Occupation: Hospital doctor-- retired     Fish farm manager: RETIRED   Occupation: works part time, Merchant [employer] officer strain: Not on Training and development officer insecurity:    Worry: Not on file    Inability: Not on Lexicographer needs:    Medical: Not on file    Non-medical: Not on file  Tobacco Use   Smoking status: Never Smoker   Smokeless tobacco: Never Used  Substance and Sexual Activity   Alcohol use: No    Alcohol/week: 0.0 standard drinks   Drug use: No   Sexual activity: Not Currently    Birth control/protection: Surgical    Lifestyle   Physical activity:    Days per week: Not on file    Minutes per session: Not on file   Stress: Not on file  Relationships   Social connections:    Talks on phone: Not on file    Gets together: Not on file    Attends religious service: Not on file    Active member of club or organization: Not on file    Attends meetings of clubs or organizations: Not on file    Relationship status: Not on file   Intimate partner violence:    Fear of current or ex partner: Not on file    Emotionally abused: Not on file    Physically abused: Not on file    Forced sexual activity: Not on file  Other Topics Concern   Not on file  Social History Narrative   HSG, 1 year college for accounting. married '69. 2 sons - '70, '72: retired - [employer]. SO- good health.   ACP - OK for CPR at least once, no long term ventilation, no heroic measures in the face of poor quality of life. HCPOA - husband, secondary son Kristy [name] (c) [phone]          Allergies as of 08/25/2018      Reactions   Iodine Anaphylaxis   Shellfish Allergy Anaphylaxis   Sulfonamide Derivatives Rash   Other Swelling   Citrus  Sometimes Lactose intolerant   Amoxicillin Diarrhea      Medication List       Accurate as of August 25, 2018 11:59 PM. Always use your most recent med list.        aspirin 325 MG tablet Take 1 tablet (325 mg total) by mouth daily.   Astepro 0.15 % Soln Generic drug:  Azelastine HCl Place 2 sprays into the nose daily.   atorvastatin 10 MG tablet Commonly known as:  LIPITOR Take 1 tablet (10 mg total) by mouth at bedtime.   diltiazem 240 MG 24 hr capsule Commonly known as:  CARDIZEM CD Take 1 capsule (240 mg total) by mouth daily.   EpiPen 0.3 mg/0.3 mL Devi Generic drug:  EPINEPHrine Inject 0.3 mg into the muscle as needed (anaphylactic). Reported on 06/26/2015   estradiol 0.1 MG/GM vaginal cream Commonly known as:  ESTRACE Apply 1 gram per vagina every two times a week    febuxostat 40 MG tablet Commonly known as:  ULORIC Take 80 mg by mouth daily.   Flovent HFA 110 MCG/ACT inhaler Generic drug:  fluticasone Take 2 puffs by mouth 2 (two) times daily.   fluticasone 50 MCG/ACT nasal spray Commonly known as:  FLONASE Place 2 sprays into both  nostrils daily.   furosemide 40 MG tablet Commonly known as:  LASIX Take 1 tablet (40 mg total) by mouth daily.   HM Vitamin D3 50 MCG (2000 UT) Caps Generic drug:  Cholecalciferol Take 2,000 Units by mouth daily.   levalbuterol 45 MCG/ACT inhaler Commonly known as:  XOPENEX HFA Inhale 2 puffs into the lungs every 4 (four) hours as needed for shortness of breath.   levocetirizine 5 MG tablet Commonly known as:  XYZAL Take 5 mg by mouth daily.   losartan 25 MG tablet Commonly known as:  COZAAR Take 2 tablets (50 mg total) by mouth daily.   metFORMIN 850 MG tablet Commonly known as:  GLUCOPHAGE 1.5 tablets twice a day   metoprolol succinate 50 MG 24 hr tablet Commonly known as:  TOPROL-XL Take 1 tablet (50 mg total) by mouth daily. Take with or immediately following a meal.   nortriptyline 25 MG capsule Commonly known as:  PAMELOR Take 25 mg by mouth at bedtime.   omeprazole 40 MG capsule Commonly known as:  PRILOSEC Take 1 capsule (40 mg total) by mouth daily.   Potassium 99 MG Tabs Take by mouth.   predniSONE 10 MG (21) Tbpk tablet Commonly known as:  STERAPRED UNI-PAK 21 TAB Take by mouth daily. Take as directed.   vitamin E 400 UNIT capsule Take 400 Units by mouth daily.           Objective:   Physical Exam There were no vitals taken for this visit. She looked well, in no distress during the video conference.  Speaking in full sentences    Assessment      Assessment DM w/ h/o TIA HTN  Hyperlipidemia  Insomnia  Asthma , allergies-- sees Dr Harold Hedge  GERD, hiatal hernia , h/o IBS Obesity  Palpitations: PSVT by Holter, Dr Gwenlyn Found TIA, dx 2014,last visit w/  Dr. Leonie [name]  09-2015, RTC prn Vertigo (improved with vestibular rehabilitation 2015) Asymmetric right ear sensorineural hearing loss with previously normal MRI.  Sees Dr. Benjamine Mola regularly. Increased LFTs --- CT abdomen 2014: NormalLiver, negative hepatitis serologies at rheumatology per patient [facility]: --Gout --DJD --Fibromyalgia, Dr. Amil [name], on [place]  --Osteopenia  FH breast cancer , sister is BRCA2 (+), pt is  (-) GU: Kidney stones, h/o; Urinary incontinence; Atrophic vaginitis Hyperparathyroidism h/o slt increased PTH 2015 , normal PTH 05-2014  HOH- aids   PLAN: Video conference Asthma exacerbation: Symptoms of started approximately 08/13/2018, chest x-ray was negative at the urgent care, she just finished her prednisone. Recommend to continue Qvar, daily rescue inhaler as needed, Mucinex DM.  Call if symptoms return. Is unlikely this is coronavirus however if she has fever, chills, chest pain, worsening symptoms needs to go to the ER. DM: Recently metformin dose was increased, had GI side effects, change metformin to 1.5 tablets twice a day and GI symptoms decrease.  Her CBGs has been high recently -likely due to prednisone- ,  203, 179.  I anticipate they will get better. 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 Labs in 3 weeks   I discussed the assessment and treatment plan with the patient. The patient was provided an opportunity to ask questions and all were answered. The patient agreed with the plan and demonstrated an understanding of the instructions.   The patient  was advised to call back or seek an in-person evaluation if the symptoms worsen or if the condition fails to improve as anticipated.  I provided 20 minutes of non-face-to-face time during this encounter.   Kathlene November, MD

## 2018-08-28 NOTE — Assessment & Plan Note (Signed)
Video conference Asthma exacerbation: Symptoms of started approximately 08/13/2018, chest x-ray was negative at the urgent care, she just finished her prednisone. Recommend to continue Qvar, daily rescue inhaler as needed, Mucinex DM.  Call if symptoms return. Is unlikely this is coronavirus however if she has fever, chills, chest pain, worsening symptoms needs to go to the ER. DM: Recently metformin dose was increased, had GI side effects, change metformin to 1.5 tablets twice a day and GI symptoms decrease.  Her CBGs has been high recently -likely due to prednisone- ,  203, 179.  I anticipate they will get better. 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 Labs in 3 weeks

## 2018-09-21 ENCOUNTER — Encounter: Payer: Self-pay | Admitting: Internal Medicine

## 2018-09-21 ENCOUNTER — Ambulatory Visit (INDEPENDENT_AMBULATORY_CARE_PROVIDER_SITE_OTHER): Payer: Medicare Other | Admitting: Internal Medicine

## 2018-09-21 DIAGNOSIS — Z8639 Personal history of other endocrine, nutritional and metabolic disease: Secondary | ICD-10-CM | POA: Diagnosis not present

## 2018-09-21 DIAGNOSIS — E213 Hyperparathyroidism, unspecified: Secondary | ICD-10-CM | POA: Diagnosis not present

## 2018-09-21 NOTE — Patient Instructions (Signed)
Please come for labs whenever safe.  I will see you back as needed.

## 2018-09-21 NOTE — Progress Notes (Addendum)
Patient ID: Kristy [name], female   DOB: [birth year], 73 y.o.   MRN: [id number]   Patient location: Home My location: Office  Referring Provider: Colon Branch, MD  I connected with the patient on 09/21/18 at  11:11 am EDT by a video enabled telemedicine application and verified that I am speaking with the correct person.   I discussed the limitations of evaluation and management by telemedicine and the availability of in person appointments. The patient expressed understanding and agreed to proceed.   Details of the encounter are shown below.  HPI  Kristy [name] is a 73 y.o.-year-old female, initially referred by her PCP, Dr. Larose Kells, returning for follow-up for eucalcemic hyperparathyroidism.  Last visit 1 year ago.  She had URI without fever x 1 mo. She was on Prednisone. CXR clear. She is still fatigued, but feeling better.  She was found to have a high PTH level in 2014.  Subsequent levels have also been elevated.  Of note, her calcium level have been normal.  At last visit, a year ago, we checked her PTH with the [facility] assay and this returned to normal.  Reviewed pertinent labs: Lab Results  Component Value Date   PTH 43 09/19/2017   PTH Comment 09/19/2017   PTH 92 (H) 07/06/2017   PTH CANCELED 07/04/2017   PTH 74 (H) 06/29/2016   PTH 58 06/07/2014   PTH 136.4 (H) 06/05/2013   PTH 125.0 (H) 04/20/2013   CALCIUM 9.8 07/10/2018   CALCIUM 10.0 01/02/2018   CALCIUM 9.6 09/19/2017   CALCIUM 9.6 07/04/2017   CALCIUM 9.4 03/28/2017   CALCIUM 9.7 11/26/2016   CALCIUM 9.8 06/29/2016   CALCIUM 9.8 06/29/2016   CALCIUM 10.1 03/30/2016   CALCIUM 9.7 06/26/2015   Calcitriol and phosphorus - also normal at last visit: Component     Latest Ref Rng & Units 09/19/2017          Vitamin D 1, 25 (OH) Total     18 - 72 pg/mL 45  Vitamin D3 1, 25 (OH)     pg/mL 45  Vitamin D2 1, 25 (OH)     pg/mL <8  Phosphorus     2.3 - 4.6 mg/dL 3.8   All the tests are normal.  No intervention  needed for now, but we will recheck her levels when she comes back in 6 months. I reviewed previous bone density reports-no osteoporosis: Date L1-L4 T score FN T score  03/08/2013 0.0 RFN: -0.5 LFN: -0.7   10/31/2006 -0.4 RFN: +0.4 LFN: -0.8   No fractures, but she has a history of BPPV and disequilibrium.  She has a possible distant history of kidney stones x1.  No history of CKD. Last BUN/Cr: Lab Results  Component Value Date   BUN 16 07/10/2018   BUN 20 01/02/2018   CREATININE 0.88 07/10/2018   CREATININE 0.94 01/02/2018   She is not on HCTZ.  She has a history of vitamin D deficiency.  Reviewed Vit D levels: Lab Results  Component Value Date   VD25OH 30.68 09/19/2017   VD25OH 42 07/19/2013   VD25OH 23 (L) 04/20/2013   She is not on calcium but she is on vitamin D 2000 units, started in 2014.  Pt does not have a FH of hypercalcemia, pituitary tumors, thyroid cancer, or osteoporosis.   Pt. also has a history of OA, fibromyalgia, asthma (allergy-related), gout - on Uloric, HTN.  She also has diabetes:  Lab Results  Component Value Date  HGBA1C 7.0 (H) 07/10/2018  Metformin was increased.  She was started on Atorvastatin.  ROS: Constitutional: no weight gain/no weight loss, no fatigue, no subjective hyperthermia, no subjective hypothermia Eyes: no blurry vision, no xerophthalmia ENT: no sore throat, no nodules palpated in neck, no dysphagia, no odynophagia, no hoarseness Cardiovascular: no CP/+ SOB/no palpitations/no leg swelling Respiratory: + cough >> improved/+ SOB/+ wheezing Gastrointestinal: no N/no V/no D/no C/no acid reflux Musculoskeletal: + muscle aches/+ joint aches Skin: no rashes, no hair loss Neurological: no tremors/no numbness/no tingling/no dizziness  I reviewed pt's medications, allergies, PMH, social hx, family hx, and changes were documented in the history of present illness. Otherwise, unchanged from my initial visit note.  Past Medical  History:  Diagnosis Date  . Allergy    Environmental, foods: shellfish, lactose intolerant, citrus, peanuts  . Asthma    mild persistent  . Atrophic vaginitis   . BRCA gene mutation negative 2015   testing done at [facility]  . DJD (degenerative joint disease)    generalized joint pain, including back  . Family history of malignant neoplasm of breast   . Fibromyalgia    Dr Amil Amen  . GERD (gastroesophageal reflux disease)   . Gout    Idiopathic, chronic  . Hiatal hernia   . History of kidney stones   . Hypertension   . Palpitations    PSVT on Holter monitoring  . Shortness of breath    occ  . Stroke ([place])   . SUI (stress urinary incontinence, female)    (resolved after surgery)  . TIA (transient ischemic attack)    07/2012, see Dr Leonie Man  . Vertigo    Past Surgical History:  Procedure Laterality Date  . ABDOMINAL HYSTERECTOMY  1994   TAH,BSO-BLADDER NECK SUSPENSION  . BREAST SURGERY     LUMPECTOMY-BENIGN Breast Bx X2  . CARPAL TUNNEL RELEASE Right 2005  . CATARACT EXTRACTION, BILATERAL Bilateral 2016   Cataract   . CHOLECYSTECTOMY, LAPAROSCOPIC  2001  . CYSTOCELE REPAIR  1994  . KNEE ARTHROPLASTY  05/19/2012   Procedure: COMPUTER ASSISTED TOTAL KNEE ARTHROPLASTY;  Surgeon: Marybelle Killings, MD;  Location: [place];  Service: Orthopedics;  Laterality: Left;  Left  Total Knee Arthroplasty  . KNEE ARTHROPLASTY Right 09/03/2013   Procedure: COMPUTER ASSISTED TOTAL KNEE ARTHROPLASTY;  Surgeon: Marybelle Killings, MD;  Location: [place];  Service: Orthopedics;  Laterality: Right;  Right Total Knee Arthroplasty, Computer Assist  . KNEE ARTHROSCOPY     bil  . [place] TIBIAL TENDON  2003  . TONSILLECTOMY    . TRIPLE FUSION LEFT ANKLE Left 2007  . TUBAL LIGATION     Social History   Socioeconomic History  . Marital status: Married    Spouse name: [name]  . Number of children: 2  . Years of education: 20  . Highest education level: Not on file   Occupational History  . 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 Occupation: works part time, Engineer, manufacturing  . Financial resource strain: Not on file  . Food insecurity:    Worry: Not on file    Inability: Not on file  . Transportation needs:    Medical: Not on file    Non-medical: Not on file  Tobacco Use  . Smoking status: Never Smoker  . Smokeless tobacco: Never Used  Substance and Sexual Activity  . Alcohol use: No  Alcohol/week: 0.0 standard drinks  . Drug use: No  . Sexual activity: Not Currently    Birth control/protection: Surgical  Lifestyle  . Physical activity:    Days per week: Not on file    Minutes per session: Not on file  . Stress: Not on file  Relationships  . Social connections:    Talks on phone: Not on file    Gets together: Not on file    Attends religious service: Not on file    Active member of club or organization: Not on file    Attends meetings of clubs or organizations: Not on file    Relationship status: Not on file  . Intimate partner violence:    Fear of current or ex partner: Not on file    Emotionally abused: Not on file    Physically abused: Not on file    Forced sexual activity: Not on file  Other Topics Concern  . 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 married '69. 2 sons - '70, '72: retired - [employer]. SO- good health.   ACP - OK for CPR at least once, no long term ventilation, no heroic measures in the face of poor quality of life. HCPOA - husband, secondary son Armella Stogner (c) [phone]       Current Outpatient Medications on File Prior to Visit  Medication Sig Dispense Refill  . aspirin 325 MG tablet Take 1 tablet (325 mg total) by mouth daily.    Marland Kitchen atorvastatin (LIPITOR) 10 MG tablet Take 1 tablet (10 mg total) by mouth at bedtime. 90 tablet 1  . Azelastine HCl (ASTEPRO) 0.15 % SOLN Place 2 sprays into the nose daily.    . Cholecalciferol (HM VITAMIN D3) 2000  UNITS CAPS Take 2,000 Units by mouth daily.    Marland Kitchen diltiazem (CARDIZEM CD) 240 MG 24 hr capsule Take 1 capsule (240 mg total) by mouth daily. 90 capsule 3  . EPINEPHrine (EPIPEN) 0.3 mg/0.3 mL DEVI Inject 0.3 mg into the muscle as needed (anaphylactic). Reported on 06/26/2015    . estradiol (ESTRACE) 0.1 MG/GM vaginal cream Apply 1 gram per vagina every two times a week 30 g 12  . febuxostat (ULORIC) 40 MG tablet Take 80 mg by mouth daily.     Marland Kitchen FLOVENT HFA 110 MCG/ACT inhaler Take 2 puffs by mouth 2 (two) times daily.  5  . fluticasone (FLONASE) 50 MCG/ACT nasal spray Place 2 sprays into both nostrils daily.    . furosemide (LASIX) 40 MG tablet Take 1 tablet (40 mg total) by mouth daily. 90 tablet 3  . levalbuterol (XOPENEX HFA) 45 MCG/ACT inhaler Inhale 2 puffs into the lungs every 4 (four) hours as needed for shortness of breath.     . levocetirizine (XYZAL) 5 MG tablet Take 5 mg by mouth daily.    Marland Kitchen losartan (COZAAR) 25 MG tablet Take 2 tablets (50 mg total) by mouth daily. 180 tablet 1  . metFORMIN (GLUCOPHAGE) 850 MG tablet 1.5 tablets twice a day 270 tablet 1  . metoprolol succinate (TOPROL-XL) 50 MG 24 hr tablet Take 1 tablet (50 mg total) by mouth daily. Take with or immediately following a meal. 90 tablet 3  . nortriptyline (PAMELOR) 25 MG capsule Take 25 mg by mouth at bedtime.      Marland Kitchen omeprazole (PRILOSEC) 40 MG capsule Take 1 capsule (40 mg total) by mouth daily. 90 capsule 3  . Potassium 99 MG TABS Take  by mouth.    . predniSONE (STERAPRED UNI-PAK 21 TAB) 10 MG (21) TBPK tablet Take by mouth daily. Take as directed. 21 tablet 0  . vitamin E 400 UNIT capsule Take 400 Units by mouth daily.     No current facility-administered medications on file prior to visit.    Allergies  Allergen Reactions  . Iodine Anaphylaxis  . Shellfish Allergy Anaphylaxis  . Sulfonamide Derivatives Rash  . Other Swelling    Citrus  Sometimes Lactose intolerant  . Amoxicillin Diarrhea   Family History   Problem Relation Age of Onset  . Breast cancer Mother 75       deceased 35  . Breast cancer Sister 55       bilateral breast ca @ 14; BRCA2 positive  . Hypertension Sister   . Cancer Father        Brain tumor; path?; deceased 67  . Lymphoma Maternal Aunt        deceased 20s  . Hemochromatosis Sister   . Thalassemia Sister   . Other Sister        negative for BRCA1 BRCA2  . Breast cancer Cousin        BRCA2 positive  . Diabetes Other   . Breast cancer Other        distant maternal female relatives with breast cancer  . CAD Son        MI  . Heart failure Sister        due to chemo?  . Colon cancer Neg Hx     PE: There were no vitals taken for this visit. Wt Readings from Last 3 Encounters:  07/10/18 204 lb 4 oz (92.6 kg)  02/07/18 205 lb 9.6 oz (93.3 kg)  01/02/18 201 lb 6 oz (91.3 kg)   Patient location: Home My location: Office  Referring Provider: Colon Branch, MD  I connected with the patient on 09/21/18 at  2:00 PM EDT by a video enabled telemedicine application and verified that I am speaking with the correct person.   I discussed the limitations of evaluation and management by telemedicine and the availability of in person appointments. The patient expressed understanding and agreed to proceed.   Details of the encounter are shown below.  Assessment: 1. Eucalcemic hyperparathyroidism  2.  History of vitamin D deficiency  3. DM  Plan: 1. Patient with history of normocalcemia, however, with an elevated intact PTH level in the past (highest level in 2015, at 136, but decreased since).  Of note, she also has a history of vitamin D deficiency the last level being normal in 08/2017 on supplementation.  At that time, I saw her for an initial visit for hyperparathyroidism.  We checked her calcium and PTH, this time with a [facility] assay, which is more accurate, and both of these values were normal.  Additionally, her calcitriol and phosphorus levels were also normal.   We discussed at that time that she may have a mild case of hyperparathyroidism (without osteoporosis or nephrolithiasis-however, she has a distant possible episode based on symptoms, but with no stones seen on images).  We decided to just follow her condition and have her back in 6 months for labs.  She did not return in 6 months so we will repeat her labs today.  She did have a calcium level checked in 07/2018 and this was again normal. -We did discuss about possible consequences of uncontrolled hyperparathyroidism, however, I do not feel that she is a good  candidate for surgery right now due to the mild nature of her condition.  In fact, if her labs today are normal, I would not recommend follow-up more than checking her vitamin D and calcium yearly.  If calcium starts to rise, we can definitely revisit treatment options. -We will check: calcium level  intact PTH ([facility]) Phosphorus vitamin D- 25 HO  If the labs above are normal with an elevated PTH, we will check: 24h urinary calcium/creatinine ratio - given instructions for urine collection - I will see the patient back on an as-needed basis.  2.  History of vitamin D deficiency -She continues on 2000 units vitamin D daily -We will recheck her vitamin D when she returns to the clinic  3. DM -Reviewed latest HbA1c and this was 7%  -Sugars are higher due to prednisone in the last month and her metformin dose was increased -This is managed by PCP -Recently started Lipitor -no side effects  Orders Placed This Encounter  Procedures  . BASIC METABOLIC PANEL WITH GFR  . VITAMIN D 25 Hydroxy (Vit-D Deficiency, Fractures)  . Vitamin D 1,25 dihydroxy  . Phosphorus  . Parathyroid hormone, intact (no Ca)   Component     Latest Ref Rng & Units 10/05/2018          Glucose     65 - 99 mg/dL 109 (H)  BUN     7 - 25 mg/dL 16  Creatinine     0.60 - 0.93 mg/dL 0.85  GFR, Est Non African American     > OR = 60 mL/min/1.62m 68  GFR, Est  African American     > OR = 60 mL/min/1.739m79  BUN/Creatinine Ratio     6 - 22 (calc) NOT APPLICABLE  Sodium     13116 146 mmol/L 141  Potassium     3.5 - 5.3 mmol/L 4.0  Chloride     98 - 110 mmol/L 102  CO2     20 - 32 mmol/L 27  Calcium     8.6 - 10.4 mg/dL 9.5  Vitamin D 1, 25 (OH) Total     18 - 72 pg/mL 47  Vitamin D3 1, 25 (OH)     pg/mL 47  Vitamin D2 1, 25 (OH)     pg/mL <8  PTH, Intact     15 - 65 pg/mL 32  VITD     30.00 - 100.00 ng/mL 43.55  Phosphorus     2.3 - 4.6 mg/dL 3.7   Labs are normal so for now I would not recommend any intervention.    CrPhilemon KingdomMD PhD LeSan Ramon Regional Medical Centerndocrinology

## 2018-09-21 NOTE — Progress Notes (Addendum)
Virtual Visit via Video Note  I connected with patient on 09/22/18 at 11:00 AM EDT by a video enabled telemedicine application and verified that I am speaking with the correct person using two identifiers.   THIS ENCOUNTER IS A VIRTUAL VISIT DUE TO COVID-19 - PATIENT WAS NOT SEEN IN THE OFFICE. PATIENT HAS CONSENTED TO VIRTUAL VISIT / TELEMEDICINE VISIT   Location of patient: home  Location of provider: office  I discussed the limitations of evaluation and management by telemedicine and the availability of in person appointments. The patient expressed understanding and agreed to proceed.   Subjective:   Kristy [name] is a 74 y.o. female who presents for Medicare Annual (Subsequent) preventive examination.  Still works part time Scientist, physiological.  Review of Systems: No ROS.  Medicare Wellness Visit. Additional risk factors are reflected in the social history.  Cardiac Risk Factors include: advanced age (>60mn, >>40women);diabetes mellitus;hypertension Sleep patterns: no issues Home Safety/Smoke Alarms: Feels safe in home. Smoke alarms in place.  Lives in 1 story home with husband and dog. Has ramp going into house. Uses tub bench.  Female:      Mammo- active order       Dexa scan-  ordered      CCS- 11/28/15. Recall 5 yrs.     Objective:     Vitals: Unable to assess. This visit is enabled though telemedicine due to Covid 19.   Advanced Directives 09/22/2018 11/13/2015 08/24/2013 07/15/2012 05/10/2012  Does Patient Have a Medical Advance Directive? Yes Yes Patient has advance directive, copy not in chart Patient has advance directive, copy not in chart Patient has advance directive, copy in chart  Type of Advance Directive [place] Living will;Healthcare Power of [place] Living will;Healthcare Power of Attorney  Does patient want to make changes to  medical advance directive? No - Patient declined No - Patient declined No change requested - -  Copy of [place] Chart? No - copy requested No - copy requested Copy requested from family Copy requested from family -  Pre-existing out of facility DNR order (yellow form or pink MOST form) - - - No -    Tobacco Social History   Tobacco Use  Smoking Status Never Smoker  Smokeless Tobacco Never Used     Counseling given: Not Answered   Clinical Intake: Pain : No/denies pain   Past Medical History:  Diagnosis Date  . Allergy    Environmental, foods: shellfish, lactose intolerant, citrus, peanuts  . Asthma    mild persistent  . Atrophic vaginitis   . BRCA gene mutation negative 2015   [facility]  . DJD (degenerative joint disease)    generalized joint pain, including back  . Family history of malignant neoplasm of breast   . Fibromyalgia    Dr BAmil Amen . GERD (gastroesophageal reflux disease)   . Gout    Idiopathic, chronic  . Hiatal hernia   . History of kidney stones   . Hypertension   . Palpitations    PSVT on Holter monitoring  . Shortness of breath    occ  . Stroke ([place]   . SUI (stress urinary incontinence, female)    (resolved after surgery)  . TIA (transient ischemic attack)    07/2012, see Dr SLeonie Man . Vertigo    Past Surgical History:  Procedure Laterality Date  . ABDOMINAL HYSTERECTOMY  1994   TAH,BSO-BLADDER NECK SUSPENSION  . BREAST SURGERY     LUMPECTOMY-BENIGN Breast Bx X2  . CARPAL TUNNEL RELEASE Right 2005  . CATARACT EXTRACTION, BILATERAL Bilateral 2016   Cataract   . CHOLECYSTECTOMY, LAPAROSCOPIC  2001  . CYSTOCELE REPAIR  1994  . KNEE ARTHROPLASTY  05/19/2012   Procedure: COMPUTER ASSISTED TOTAL KNEE ARTHROPLASTY;  Surgeon: Marybelle Killings, MD;  Location: [facility];  Service: Orthopedics;  Laterality: Left;  Left  Total Knee Arthroplasty  . KNEE ARTHROPLASTY Right 09/03/2013   Procedure: COMPUTER ASSISTED TOTAL  KNEE ARTHROPLASTY;  Surgeon: Marybelle Killings, MD;  Location: [facility];  Service: Orthopedics;  Laterality: Right;  Right Total Knee Arthroplasty, Computer Assist  . KNEE ARTHROSCOPY     bil  . [place] TIBIAL TENDON  2003  . TONSILLECTOMY    . TRIPLE FUSION LEFT ANKLE Left 2007  . TUBAL LIGATION     Family History  Problem Relation Age of Onset  . Breast cancer Mother 34       deceased 81  . Breast cancer Sister 17       bilateral breast ca @ 50; BRCA2 positive  . Hypertension Sister   . Cancer Father        Brain tumor; path?; deceased 63  . Lymphoma Maternal Aunt        deceased 39s  . Hemochromatosis Sister   . Thalassemia Sister   . Other Sister        negative for BRCA1 BRCA2  . Breast cancer Cousin        BRCA2 positive  . Diabetes Other   . Breast cancer Other        distant maternal female relatives with breast cancer  . CAD Son        MI  . Heart failure Sister        due to chemo?  . Colon cancer Neg Hx    Social History   Socioeconomic History  . Marital status: Married    Spouse name: [name]  . Number of children: 2  . Years of education: 66  . Highest education level: Not on file  Occupational History  . 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 Occupation: works part time, Engineer, manufacturing  . Financial resource strain: Not on file  . Food insecurity:    Worry: Not on file    Inability: Not on file  . Transportation needs:    Medical: Not on file    Non-medical: Not on file  Tobacco Use  . Smoking status: Never Smoker  . Smokeless tobacco: Never Used  Substance and Sexual Activity  . Alcohol use: No    Alcohol/week: 0.0 standard drinks  . Drug use: No  . Sexual activity: Not Currently    Birth control/protection: Surgical  Lifestyle  . Physical activity:    Days per week: Not on file    Minutes per session: Not on file  . Stress: Not on file  Relationships  . Social connections:     Talks on phone: Not on file    Gets together: Not on file    Attends religious service: Not on file    Active member of club or organization: Not on file    Attends meetings of clubs or organizations: Not on file    Relationship status: Not on file  Other Topics Concern  .  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 married '69. 2 sons - '70, '72: retired - [employer]. SO- good health.   ACP - OK for CPR at least once, no long term ventilation, no heroic measures in the face of poor quality of life. HCPOA - husband, secondary son Siona Coulston (c) [phone]        Outpatient Encounter Medications as of 09/22/2018  Medication Sig  . aspirin 325 MG tablet Take 1 tablet (325 mg total) by mouth daily.  Marland Kitchen atorvastatin (LIPITOR) 10 MG tablet Take 1 tablet (10 mg total) by mouth at bedtime.  . Azelastine HCl (ASTEPRO) 0.15 % SOLN Place 2 sprays into the nose daily.  . Cholecalciferol (HM VITAMIN D3) 2000 UNITS CAPS Take 2,000 Units by mouth daily.  Marland Kitchen diltiazem (CARDIZEM CD) 240 MG 24 hr capsule Take 1 capsule (240 mg total) by mouth daily.  Marland Kitchen EPINEPHrine (EPIPEN) 0.3 mg/0.3 mL DEVI Inject 0.3 mg into the muscle as needed (anaphylactic). Reported on 06/26/2015  . estradiol (ESTRACE) 0.1 MG/GM vaginal cream Apply 1 gram per vagina every two times a week  . FLOVENT HFA 110 MCG/ACT inhaler Take 2 puffs by mouth 2 (two) times daily.  . fluticasone (FLONASE) 50 MCG/ACT nasal spray Place 2 sprays into both nostrils daily.  . furosemide (LASIX) 40 MG tablet Take 1 tablet (40 mg total) by mouth daily.  Marland Kitchen levalbuterol (XOPENEX HFA) 45 MCG/ACT inhaler Inhale 2 puffs into the lungs every 4 (four) hours as needed for shortness of breath.   . levocetirizine (XYZAL) 5 MG tablet Take 5 mg by mouth daily.  Marland Kitchen losartan (COZAAR) 25 MG tablet Take 2 tablets (50 mg total) by mouth daily.  . metFORMIN (GLUCOPHAGE) 850 MG tablet 1.5 tablets twice a day  . metoprolol succinate (TOPROL-XL) 50 MG  24 hr tablet Take 1 tablet (50 mg total) by mouth daily. Take with or immediately following a meal.  . nortriptyline (PAMELOR) 25 MG capsule Take 25 mg by mouth at bedtime.    Marland Kitchen omeprazole (PRILOSEC) 40 MG capsule Take 1 capsule (40 mg total) by mouth daily.  . Potassium 99 MG TABS Take by mouth.  . vitamin E 400 UNIT capsule Take 400 Units by mouth daily.  . febuxostat (ULORIC) 40 MG tablet Take 80 mg by mouth daily.   . [DISCONTINUED] predniSONE (STERAPRED UNI-PAK 21 TAB) 10 MG (21) TBPK tablet Take by mouth daily. Take as directed.   No facility-administered encounter medications on file as of 09/22/2018.     Activities of Daily Living In your present state of health, do you have any difficulty performing the following activities: 09/22/2018 07/10/2018  Hearing? Y N  Comment wearing hearing aids -  Vision? N N  Comment hx cataract sx. wears readers -  Difficulty concentrating or making decisions? N N  Walking or climbing stairs? N N  Dressing or bathing? N N  Doing errands, shopping? N N  Preparing Food and eating ? N -  Using the Toilet? N -  In the past six months, have you accidently leaked urine? N -  Do you have problems with loss of bowel control? N -  Managing your Medications? N -  Managing your Finances? N -  Housekeeping or managing your Housekeeping? N -  Some recent data might be hidden    Patient Care Team: Colon Branch, MD as PCP - General (Internal Medicine) Hennie Duos, MD (Rheumatology) Lorretta Harp,  MD as Consulting Physician (Cardiology) Steele Berg, Counselor (Inactive) (Genetic Counselor) Garvin Fila, MD as Consulting Physician (Neurology) Harold Hedge, Darrick Grinder, MD as Consulting Physician (Allergy and Immunology) Leta Baptist, MD as Consulting Physician (Otolaryngology)    Assessment:   This is a routine wellness examination for Kristy [name].  Exercise Activities and Dietary recommendations Current Exercise Habits: Home exercise routine, Type of  exercise: walking, Time (Minutes): 10, Frequency (Times/Week): 5, Weekly Exercise (Minutes/Week): 50, Intensity: Mild, Exercise limited by: None identified  Goals   None     Fall Risk Fall Risk  09/22/2018 07/10/2018 07/04/2017 05/05/2017 03/30/2016  Falls in the past year? 0 1 No Yes No  Comment - - - Emmi Telephone Survey: data to providers prior to load -  Number falls in past yr: - 1 - 2 or more -  Comment - - - Emmi Telephone Survey Actual Response = 3 -  Injury with Fall? - 0 - No -   Depression Screen PHQ 2/9 Scores 09/22/2018 07/10/2018 07/04/2017 03/30/2016  PHQ - 2 Score 0 0 0 0  PHQ- 9 Score - 6 - -     Cognitive Function Ad8 score reviewed for issues:  Issues making decisions:no  Less interest in hobbies / activities:no  Repeats questions, stories (family complaining):no  Trouble using ordinary gadgets (microwave, computer, phone):no  Forgets the month or year: no  Mismanaging finances: no  Remembering appts:no  Daily problems with thinking and/or memory:no Ad8 score is=0          Immunization History  Administered Date(s) Administered  . Influenza Whole 02/28/2009, 02/20/2010, 04/26/2012  . Influenza, High Dose Seasonal PF 02/11/2015, 03/30/2016, 03/28/2017, 03/09/2018  . Influenza,inj,Quad PF,6+ Mos 02/19/2014  . Influenza-Unspecified 04/20/2013  . Pneumococcal Conjugate-13 06/05/2013  . Pneumococcal Polysaccharide-23 02/20/2010  . Tdap 05/05/2011    Screening Tests Health Maintenance  Topic Date Due  . MAMMOGRAM  07/22/2017  . INFLUENZA VACCINE  12/30/2018  . HEMOGLOBIN A1C  01/08/2019  . OPHTHALMOLOGY EXAM  01/28/2019  . FOOT EXAM  07/11/2019  . COLONOSCOPY  11/27/2020  . TETANUS/TDAP  05/04/2021  . DEXA SCAN  Completed  . Hepatitis C Screening  Completed  . PNA vac Low Risk Adult  Completed       Plan:   See you next year.  Continue to eat heart healthy diet (full of fruits, vegetables, whole grains, lean protein, water--limit salt,  fat, and sugar intake) and increase physical activity as tolerated.  Continue doing brain stimulating activities (puzzles, reading, adult coloring books, staying active) to keep memory sharp.   Bring a copy of your living will and/or healthcare power of attorney to your next office visit.  Your mammogram and bone density scan have been ordered. Please schedule once the pandemic is over.  I have personally reviewed and noted the following in the patient's chart:   . Medical and social history . Use of alcohol, tobacco or illicit drugs  . Current medications and supplements . Functional ability and status . Nutritional status . Physical activity . Advanced directives . List of other physicians . Hospitalizations, surgeries, and ER visits in previous 12 months . Vitals . Screenings to include cognitive, depression, and falls . Referrals and appointments  In addition, I have reviewed and discussed with patient certain preventive protocols, quality metrics, and best practice recommendations. A written personalized care plan for preventive services as well as general preventive health recommendations were provided to patient.     Shela Nevin, RN  09/22/2018    Kathlene November, MD

## 2018-09-22 ENCOUNTER — Other Ambulatory Visit: Payer: Self-pay | Admitting: Internal Medicine

## 2018-09-22 ENCOUNTER — Encounter: Payer: Self-pay | Admitting: *Deleted

## 2018-09-22 ENCOUNTER — Ambulatory Visit (INDEPENDENT_AMBULATORY_CARE_PROVIDER_SITE_OTHER): Payer: Medicare Other | Admitting: *Deleted

## 2018-09-22 ENCOUNTER — Other Ambulatory Visit: Payer: Self-pay

## 2018-09-22 DIAGNOSIS — Z780 Asymptomatic menopausal state: Secondary | ICD-10-CM

## 2018-09-22 DIAGNOSIS — E2839 Other primary ovarian failure: Secondary | ICD-10-CM

## 2018-09-22 DIAGNOSIS — Z0000 Encounter for general adult medical examination without abnormal findings: Secondary | ICD-10-CM

## 2018-09-22 NOTE — Patient Instructions (Signed)
See you next year.  Continue to eat heart healthy diet (full of fruits, vegetables, whole grains, lean protein, water--limit salt, fat, and sugar intake) and increase physical activity as tolerated.  Continue doing brain stimulating activities (puzzles, reading, adult coloring books, staying active) to keep memory sharp.   Bring a copy of your living will and/or healthcare power of attorney to your next office visit.  Your mammogram and bone density scan have been ordered. Please schedule once the pandemic is over.   Kristy [name] , Thank you for taking time to come for your Medicare Wellness Visit. I appreciate your ongoing commitment to your health goals. Please review the following plan we discussed and let me know if I can assist you in the future.   These are the goals we discussed: Goals    . Increase physical activity       This is a list of the screening recommended for you and due dates:  Health Maintenance  Topic Date Due  . Mammogram  07/22/2017  . Flu Shot  12/30/2018  . Hemoglobin A1C  01/08/2019  . Eye exam for diabetics  01/28/2019  . Complete foot exam   07/11/2019  . Colon Cancer Screening  11/27/2020  . Tetanus Vaccine  05/04/2021  . DEXA scan (bone density measurement)  Completed  .  Hepatitis C: One time screening is recommended by Center for Disease Control  (CDC) for  adults born from [birth year] through 1966.   Completed  . Pneumonia vaccines  Completed    Health Maintenance After Age 58 After age 59, you are at a higher risk for certain long-term diseases and infections as well as injuries from falls. Falls are a major cause of broken bones and head injuries in people who are older than age 68. Getting regular preventive care can help to keep you healthy and well. Preventive care includes getting regular testing and making lifestyle changes as recommended by your health care provider. Talk with your health care provider about:  Which screenings and tests you should  have. A screening is a test that checks for a disease when you have no symptoms.  A diet and exercise plan that is right for you. What should I know about screenings and tests to prevent falls? Screening and testing are the best ways to find a health problem early. Early diagnosis and treatment give you the best chance of managing medical conditions that are common after age 33. Certain conditions and lifestyle choices may make you more likely to have a fall. Your health care provider may recommend:  Regular vision checks. Poor vision and conditions such as cataracts can make you more likely to have a fall. If you wear glasses, make sure to get your prescription updated if your vision changes.  Medicine review. Work with your health care provider to regularly review all of the medicines you are taking, including over-the-counter medicines. Ask your health care provider about any side effects that may make you more likely to have a fall. Tell your health care provider if any medicines that you take make you feel dizzy or sleepy.  Osteoporosis screening. Osteoporosis is a condition that causes the bones to get weaker. This can make the bones weak and cause them to break more easily.  Blood pressure screening. Blood pressure changes and medicines to control blood pressure can make you feel dizzy.  Strength and balance checks. Your health care provider may recommend certain tests to check your strength and balance while  standing, walking, or changing positions.  Foot health exam. Foot pain and numbness, as well as not wearing proper footwear, can make you more likely to have a fall.  Depression screening. You may be more likely to have a fall if you have a fear of falling, feel emotionally low, or feel unable to do activities that you used to do.  Alcohol use screening. Using too much alcohol can affect your balance and may make you more likely to have a fall. What actions can I take to lower my  risk of falls? General instructions  Talk with your health care provider about your risks for falling. Tell your health care provider if: ? You fall. Be sure to tell your health care provider about all falls, even ones that seem minor. ? You feel dizzy, sleepy, or off-balance.  Take over-the-counter and prescription medicines only as told by your health care provider. These include any supplements.  Eat a healthy diet and maintain a healthy weight. A healthy diet includes low-fat dairy products, low-fat (lean) meats, and fiber from whole grains, beans, and lots of fruits and vegetables. Home safety  Remove any tripping hazards, such as rugs, cords, and clutter.  Install safety equipment such as grab bars in bathrooms and safety rails on stairs.  Keep rooms and walkways well-lit. Activity   Follow a regular exercise program to stay fit. This will help you maintain your balance. Ask your health care provider what types of exercise are appropriate for you.  If you need a cane or walker, use it as recommended by your health care provider.  Wear supportive shoes that have nonskid soles. Lifestyle  Do not drink alcohol if your health care provider tells you not to drink.  If you drink alcohol, limit how much you have: ? 0-1 drink a day for women. ? 0-2 drinks a day for men.  Be aware of how much alcohol is in your drink. In the [place] one drink equals one typical bottle of beer (12 oz), one-half glass of wine (5 oz), or one shot of hard liquor (1 oz).  Do not use any products that contain nicotine or tobacco, such as cigarettes and e-cigarettes. If you need help quitting, ask your health care provider. Summary  Having a healthy lifestyle and getting preventive care can help to protect your health and wellness after age 44.  Screening and testing are the best way to find a health problem early and help you avoid having a fall. Early diagnosis and treatment give you the best chance  for managing medical conditions that are more common for people who are older than age 34.  Falls are a major cause of broken bones and head injuries in people who are older than age 31. Take precautions to prevent a fall at home.  Work with your health care provider to learn what changes you can make to improve your health and wellness and to prevent falls. This information is not intended to replace advice given to you by your health care provider. Make sure you discuss any questions you have with your health care provider. Document Released: 03/30/2017 Document Revised: 03/30/2017 Document Reviewed: 03/30/2017 Elsevier Interactive Patient Education  2019 [employer].

## 2018-09-25 ENCOUNTER — Other Ambulatory Visit: Payer: Self-pay

## 2018-09-25 ENCOUNTER — Other Ambulatory Visit (INDEPENDENT_AMBULATORY_CARE_PROVIDER_SITE_OTHER): Payer: Medicare Other

## 2018-09-25 DIAGNOSIS — E7800 Pure hypercholesterolemia, unspecified: Secondary | ICD-10-CM | POA: Diagnosis not present

## 2018-09-26 LAB — LIPID PANEL
Cholesterol: 103 mg/dL (ref 0–200)
HDL: 31.6 mg/dL — ABNORMAL LOW (ref >39.00)
NonHDL: 71.42
Total CHOL/HDL Ratio: 3
Triglycerides: 216 mg/dL — ABNORMAL HIGH (ref 0.0–149.0)
VLDL: 43.2 mg/dL — ABNORMAL HIGH (ref 0.0–40.0)

## 2018-09-26 LAB — LDL CHOLESTEROL, DIRECT: Direct LDL: 47 mg/dL

## 2018-09-26 LAB — AST: AST: 27 U/L (ref 0–37)

## 2018-09-26 LAB — ALT: ALT: 36 U/L — ABNORMAL HIGH (ref 0–35)

## 2018-09-29 MED ORDER — ATORVASTATIN CALCIUM 10 MG PO TABS: 10.0000 mg | ORAL_TABLET | Freq: Every day | ORAL | 3 refills | Status: DC

## 2018-09-29 NOTE — Addendum Note (Signed)
Addended byDamita Dunnings D on: 09/29/2018 08:53 AM   Modules accepted: Orders

## 2018-10-02 ENCOUNTER — Other Ambulatory Visit: Payer: Medicare Other

## 2018-10-05 ENCOUNTER — Other Ambulatory Visit (INDEPENDENT_AMBULATORY_CARE_PROVIDER_SITE_OTHER): Payer: Medicare Other

## 2018-10-05 ENCOUNTER — Other Ambulatory Visit: Payer: Self-pay | Admitting: Internal Medicine

## 2018-10-05 ENCOUNTER — Other Ambulatory Visit: Payer: Self-pay

## 2018-10-05 DIAGNOSIS — Z8639 Personal history of other endocrine, nutritional and metabolic disease: Secondary | ICD-10-CM

## 2018-10-05 DIAGNOSIS — E213 Hyperparathyroidism, unspecified: Secondary | ICD-10-CM

## 2018-10-05 LAB — VITAMIN D 25 HYDROXY (VIT D DEFICIENCY, FRACTURES): VITD: 43.55 ng/mL (ref 30.00–100.00)

## 2018-10-05 LAB — PHOSPHORUS: Phosphorus: 3.7 mg/dL (ref 2.3–4.6)

## 2018-10-06 LAB — PARATHYROID HORMONE, INTACT (NO CA): PTH: 32 pg/mL (ref 15–65)

## 2018-10-08 LAB — BASIC METABOLIC PANEL WITH GFR
BUN: 16 mg/dL (ref 7–25)
CO2: 27 mmol/L (ref 20–32)
Calcium: 9.5 mg/dL (ref 8.6–10.4)
Chloride: 102 mmol/L (ref 98–110)
Creat: 0.85 mg/dL (ref 0.60–0.93)
GFR, Est African American: 79 mL/min/{1.73_m2} (ref >=60)
GFR, Est Non African American: 68 mL/min/{1.73_m2} (ref >=60)
Glucose, Bld: 109 mg/dL — ABNORMAL HIGH (ref 65–99)
Potassium: 4 mmol/L (ref 3.5–5.3)
Sodium: 141 mmol/L (ref 135–146)

## 2018-10-08 LAB — VITAMIN D 1,25 DIHYDROXY
Vitamin D 1, 25 (OH)2 Total: 47 pg/mL (ref 18–72)
Vitamin D2 1, 25 (OH)2: <8 pg/mL
Vitamin D3 1, 25 (OH)2: 47 pg/mL

## 2018-10-19 DIAGNOSIS — M65321 Trigger finger, right index finger: Secondary | ICD-10-CM | POA: Diagnosis not present

## 2018-10-19 DIAGNOSIS — M797 Fibromyalgia: Secondary | ICD-10-CM | POA: Diagnosis not present

## 2018-10-19 DIAGNOSIS — M1A09X0 Idiopathic chronic gout, multiple sites, without tophus (tophi): Secondary | ICD-10-CM | POA: Diagnosis not present

## 2018-10-19 DIAGNOSIS — R945 Abnormal results of liver function studies: Secondary | ICD-10-CM | POA: Diagnosis not present

## 2018-10-19 DIAGNOSIS — M5136 Other intervertebral disc degeneration, lumbar region: Secondary | ICD-10-CM | POA: Diagnosis not present

## 2018-10-19 DIAGNOSIS — M150 Primary generalized (osteo)arthritis: Secondary | ICD-10-CM | POA: Diagnosis not present

## 2018-10-25 ENCOUNTER — Telehealth: Payer: Self-pay

## 2018-10-25 NOTE — Telephone Encounter (Signed)
Labs routed to Dr. Beekman   

## 2018-10-25 NOTE — Telephone Encounter (Signed)
Copied from [place] [id number]. Topic: Medical Record Request - Other >> Oct 25, 2018  3:30 PM Oneta Rack wrote: Relation to pt: self  Call back number: [phone] (Preferred)   Reason for call:  Patient states in the past PCP has sent over lab results to Dr. Leigh [facility] [phone]. Patient requesting  07/2018 to current please fax to specialist.

## 2018-11-28 DIAGNOSIS — J3081 Allergic rhinitis due to animal (cat) (dog) hair and dander: Secondary | ICD-10-CM | POA: Diagnosis not present

## 2018-11-28 DIAGNOSIS — J3089 Other allergic rhinitis: Secondary | ICD-10-CM | POA: Diagnosis not present

## 2018-11-28 DIAGNOSIS — J301 Allergic rhinitis due to pollen: Secondary | ICD-10-CM | POA: Diagnosis not present

## 2018-11-28 DIAGNOSIS — H1045 Other chronic allergic conjunctivitis: Secondary | ICD-10-CM | POA: Diagnosis not present

## 2018-12-19 DIAGNOSIS — H903 Sensorineural hearing loss, bilateral: Secondary | ICD-10-CM | POA: Diagnosis not present

## 2018-12-19 DIAGNOSIS — H838X3 Other specified diseases of inner ear, bilateral: Secondary | ICD-10-CM | POA: Diagnosis not present

## 2018-12-19 DIAGNOSIS — R42 Dizziness and giddiness: Secondary | ICD-10-CM | POA: Diagnosis not present

## 2018-12-27 DIAGNOSIS — H903 Sensorineural hearing loss, bilateral: Secondary | ICD-10-CM | POA: Diagnosis not present

## 2018-12-27 DIAGNOSIS — R42 Dizziness and giddiness: Secondary | ICD-10-CM | POA: Diagnosis not present

## 2019-01-02 ENCOUNTER — Ambulatory Visit
Admission: RE | Admit: 2019-01-02 | Discharge: 2019-01-02 | Disposition: A | Discharge status: Home / Self Care | Payer: Medicare Other | Source: Ambulatory Visit | Attending: Internal Medicine | Admitting: Internal Medicine

## 2019-01-02 ENCOUNTER — Other Ambulatory Visit: Payer: Self-pay

## 2019-01-02 ENCOUNTER — Other Ambulatory Visit: Payer: Self-pay | Admitting: Internal Medicine

## 2019-01-02 DIAGNOSIS — E2839 Other primary ovarian failure: Secondary | ICD-10-CM

## 2019-01-02 DIAGNOSIS — M85851 Other specified disorders of bone density and structure, right thigh: Secondary | ICD-10-CM | POA: Diagnosis not present

## 2019-01-02 DIAGNOSIS — Z1231 Encounter for screening mammogram for malignant neoplasm of breast: Secondary | ICD-10-CM | POA: Diagnosis not present

## 2019-01-02 DIAGNOSIS — Z780 Asymptomatic menopausal state: Secondary | ICD-10-CM | POA: Diagnosis not present

## 2019-01-04 DIAGNOSIS — H832X1 Labyrinthine dysfunction, right ear: Secondary | ICD-10-CM | POA: Diagnosis not present

## 2019-01-04 DIAGNOSIS — R42 Dizziness and giddiness: Secondary | ICD-10-CM | POA: Diagnosis not present

## 2019-01-04 DIAGNOSIS — R2689 Other abnormalities of gait and mobility: Secondary | ICD-10-CM | POA: Diagnosis not present

## 2019-01-05 ENCOUNTER — Other Ambulatory Visit: Payer: Self-pay

## 2019-01-05 ENCOUNTER — Ambulatory Visit (INDEPENDENT_AMBULATORY_CARE_PROVIDER_SITE_OTHER): Payer: Medicare Other | Admitting: Obstetrics & Gynecology

## 2019-01-05 ENCOUNTER — Encounter: Payer: Self-pay | Admitting: Obstetrics & Gynecology

## 2019-01-05 VITALS — BP 124/57 | HR 89 | Ht 65.0 in | Wt 206.0 lb

## 2019-01-05 DIAGNOSIS — M8580 Other specified disorders of bone density and structure, unspecified site: Secondary | ICD-10-CM | POA: Diagnosis not present

## 2019-01-05 DIAGNOSIS — Z01419 Encounter for gynecological examination (general) (routine) without abnormal findings: Secondary | ICD-10-CM

## 2019-01-05 NOTE — Progress Notes (Signed)
Patient presents for annual exam. Kathrene Alu Rn

## 2019-01-05 NOTE — Patient Instructions (Signed)
Osteopenia  Osteopenia is a loss of thickness (density) inside of the bones. Another name for osteopenia is low bone mass. Mild osteopenia is a normal part of aging. It is not a disease, and it does not cause symptoms. However, if you have osteopenia and continue to lose bone mass, you could develop a condition that causes the bones to become thin and break more easily (osteoporosis). You may also lose some height, have back pain, and have a stooped posture. Although osteopenia is not a disease, making changes to your lifestyle and diet can help to prevent osteopenia from developing into osteoporosis. What are the causes? Osteopenia is caused by loss of calcium in the bones.  Bones are constantly changing. Old bone cells are continually being replaced with new bone cells. This process builds new bone. The mineral calcium is needed to build new bone and maintain bone density. Bone density is usually highest around age 35. After that, most people's bodies cannot replace all the bone they have lost with new bone. What increases the risk? You are more likely to develop this condition if:  You are older than age 50.  You are a woman who went through menopause early.  You have a long illness that keeps you in bed.  You do not get enough exercise.  You lack certain nutrients (malnutrition).  You have an overactive thyroid gland (hyperthyroidism).  You smoke.  You drink a lot of alcohol.  You are taking medicines that weaken the bones, such as steroids. What are the signs or symptoms? This condition does not cause any symptoms. You may have a slightly higher risk for bone breaks (fractures), so getting fractures more easily than normal may be an indication of osteopenia. How is this diagnosed? Your health care provider can diagnose this condition with a special type of X-ray exam that measures bone density (dual-energy X-ray absorptiometry, DEXA). This test can measure bone density in your  hips, spine, and wrists. Osteopenia has no symptoms, so this condition is usually diagnosed after a routine bone density screening test is done for osteoporosis. This routine screening is usually done for:  Women who are age 65 or older.  Men who are age 70 or older. If you have risk factors for osteopenia, you may have the screening test at an earlier age. How is this treated? Making dietary and lifestyle changes can lower your risk for osteoporosis. If you have severe osteopenia that is close to becoming osteoporosis, your health care provider may prescribe medicines and dietary supplements such as calcium and vitamin D. These supplements help to rebuild bone density. Follow these instructions at home:   Take over-the-counter and prescription medicines only as told by your health care provider. These include vitamins and supplements.  Eat a diet that is high in calcium and vitamin D. ? Calcium is found in dairy products, beans, salmon, and leafy green vegetables like spinach and broccoli. ? Look for foods that have vitamin D and calcium added to them (fortified foods), such as orange juice, cereal, and bread.  Do 30 or more minutes of a weight-bearing exercise every day, such as walking, jogging, or playing a sport. These types of exercises strengthen the bones.  Take precautions at home to lower your risk of falling, such as: ? Keeping rooms well-lit and free of clutter, such as cords. ? Installing safety rails on stairs. ? Using rubber mats in the bathroom or other areas that are often wet or slippery.  Do not use   any products that contain nicotine or tobacco, such as cigarettes and e-cigarettes. If you need help quitting, ask your health care provider.  Avoid alcohol or limit alcohol intake to no more than 1 drink a day for nonpregnant women and 2 drinks a day for men. One drink equals 12 oz of beer, 5 oz of wine, or 1 oz of hard liquor.  Keep all follow-up visits as told by your  health care provider. This is important. Contact a health care provider if:  You have not had a bone density screening for osteoporosis and you are: ? A woman, age 3 or older. ? A man, age 61 or older.  You are a postmenopausal woman who has not had a bone density screening for osteoporosis.  You are older than age 77 and you want to know if you should have bone density screening for osteoporosis. Summary  Osteopenia is a loss of thickness (density) inside of the bones. Another name for osteopenia is low bone mass.  Osteopenia is not a disease, but it may increase your risk for a condition that causes the bones to become thin and break more easily (osteoporosis).  You may be at risk for osteopenia if you are older than age 84 or if you are a woman who went through early menopause.  Osteopenia does not cause any symptoms, but it can be diagnosed with a bone density screening test.  Dietary and lifestyle changes are the first treatment for osteopenia. These may lower your risk for osteoporosis. This information is not intended to replace advice given to you by your health care provider. Make sure you discuss any questions you have with your health care provider. Document Released: 02/23/2017 Document Revised: 04/29/2017 Document Reviewed: 02/23/2017 Elsevier Patient Education  2020 [employer].

## 2019-01-05 NOTE — Progress Notes (Signed)
Subjective:     Kristy [name] is a 73 y.o. female here for a routine exam.  Current complaints: pt with no GYN concerns. She has been using the coconut oil and occ EES cream but, she forgest to use the EES cream.   Pt has had a long time h/o dizziness. recently dx'd with Meniere's disease.    Gynecologic History No LMP recorded. Patient has had a hysterectomy. Contraception: status post hysterectomy Last Pap: n/a Last mammogram: 01/02/2019. Results were: normal  Obstetric History OB History  Gravida Para Term Preterm AB Living  2 2       2   SAB TAB Ectopic Multiple Live Births               # Outcome Date GA Lbr Len/2nd Weight Sex Delivery Anes PTL Lv  2 Para           1 Para             The following portions of the patient's history were reviewed and updated as appropriate: allergies, current medications, past family history, past medical history, past social history, past surgical history and problem list.  Review of Systems Pertinent items are noted in HPI.    Objective:  BP (!) 124/57   Pulse 89   Ht 5\' 5"  (1.651 m)   Wt 206 lb (93.4 kg)   BMI 34.28 kg/m  General Appearance:    Alert, cooperative, no distress, appears stated age  Head:    Normocephalic, without obvious abnormality, atraumatic  Eyes:    conjunctiva/corneas clear, EOM's intact, both eyes  Ears:    Normal external ear canals, both ears  Nose:   Nares normal, septum midline, mucosa normal, no drainage    or sinus tenderness  Throat:   Lips, mucosa, and tongue normal; teeth and gums normal  Neck:   Supple, symmetrical, trachea midline, no adenopathy;    thyroid:  no enlargement/tenderness/nodules  Back:     Symmetric, no curvature, ROM normal, no CVA tenderness  Lungs:     respirations unlabored  Chest Wall:    No tenderness or deformity   Heart:    Regular rate and rhythm  Breast Exam:    No tenderness, masses, or nipple abnormality  Abdomen:     Soft, non-tender, bowel sounds active all four quadrants,     no masses, no organomegaly  Genitalia:    Normal female without lesion, discharge or tenderness; atrophic changes.      Extremities:   Extremities normal, atraumatic, no cyanosis or edema  Pulses:   2+ and symmetric all extremities  Skin:   Skin color, texture, turgor normal, no rashes or lesions    Assessment:    Healthy female exam.   Atrophic vaginitis  Osteopenia:  dualFemur Neck Right 01/02/2019    73.1         -1.4    0.848 g/cm2  AP Spine  L1-L4      01/02/2019    73.1         -0.6    1.113 g/cm2  DualFemur Total Mean 01/02/2019    73.1         -1.1    0.870 g/cm2  World Health Organization [facility]) criteria for post-menopausal, Caucasian Women: Normal       T-score at or above -1 SD Osteopenia   T-score between -1 and -2.5 SD Osteoporosis T-score at or below -2.5 SD    Plan:   F/u in 1 year  Begin Ca 1200mg  daily  cont Vit D 2000 daily Fall risk- about to begin balance therapy   Repeat BMD in 2 years Reviewed walking and strength training  Kathey Simer L. Harraway-Smith, M.D., Cherlynn June

## 2019-01-08 ENCOUNTER — Encounter: Payer: Self-pay | Admitting: Internal Medicine

## 2019-01-08 ENCOUNTER — Ambulatory Visit (INDEPENDENT_AMBULATORY_CARE_PROVIDER_SITE_OTHER): Payer: Medicare Other | Admitting: Internal Medicine

## 2019-01-08 ENCOUNTER — Other Ambulatory Visit: Payer: Self-pay

## 2019-01-08 VITALS — BP 120/80 | HR 100 | Temp 98.1°F | Resp 18 | Ht 65.0 in | Wt 206.2 lb

## 2019-01-08 DIAGNOSIS — E7800 Pure hypercholesterolemia, unspecified: Secondary | ICD-10-CM

## 2019-01-08 DIAGNOSIS — E1169 Type 2 diabetes mellitus with other specified complication: Secondary | ICD-10-CM | POA: Diagnosis not present

## 2019-01-08 DIAGNOSIS — I10 Essential (primary) hypertension: Secondary | ICD-10-CM | POA: Diagnosis not present

## 2019-01-08 LAB — CBC WITH DIFFERENTIAL/PLATELET
Basophils Absolute: 0.2 10*3/uL — ABNORMAL HIGH (ref 0.0–0.1)
Basophils Relative: 1.3 % (ref 0.0–3.0)
Eosinophils Absolute: 0.3 10*3/uL (ref 0.0–0.7)
Eosinophils Relative: 2.7 % (ref 0.0–5.0)
HCT: 42.4 % (ref 36.0–46.0)
Hemoglobin: 14 g/dL (ref 12.0–15.0)
Lymphocytes Relative: 34.5 % (ref 12.0–46.0)
Lymphs Abs: 4.2 10*3/uL — ABNORMAL HIGH (ref 0.7–4.0)
MCHC: 33.1 g/dL (ref 30.0–36.0)
MCV: 82.3 fl (ref 78.0–100.0)
Monocytes Absolute: 1.1 10*3/uL — ABNORMAL HIGH (ref 0.1–1.0)
Monocytes Relative: 9.5 % (ref 3.0–12.0)
Neutro Abs: 6.3 10*3/uL (ref 1.4–7.7)
Neutrophils Relative %: 52 % (ref 43.0–77.0)
Platelets: 335 10*3/uL (ref 150.0–400.0)
RBC: 5.15 Mil/uL — ABNORMAL HIGH (ref 3.87–5.11)
RDW: 15.2 % (ref 11.5–15.5)
WBC: 12.1 10*3/uL — ABNORMAL HIGH (ref 4.0–10.5)

## 2019-01-08 LAB — TSH: TSH: 5.34 u[IU]/mL — ABNORMAL HIGH (ref 0.35–4.50)

## 2019-01-08 LAB — HEMOGLOBIN A1C: Hgb A1c MFr Bld: 7.1 % — ABNORMAL HIGH (ref 4.6–6.5)

## 2019-01-08 MED ORDER — SHINGRIX 50 MCG/0.5ML IM SUSR: 0.5000 mL | Freq: Once | INTRAMUSCULAR | 1 refills | Status: AC

## 2019-01-08 NOTE — Assessment & Plan Note (Signed)
-  Td 2012 - Pneumonia shot 2017 -Prevnar  2015 . shingrix d/w pt, Rx printed  - flu shot recommended   - Female care :   saw gyn 01/05/2019  - CCS: s/p colonoscopy in 2012, colonoscopy 11-2015, next in 5 years - (+)FH  of breast cancer (pt is BRCA negative) and CAD

## 2019-01-08 NOTE — Progress Notes (Signed)
Subjective:    Patient ID: Kristy [name], female    DOB: Jul 22, 1945, 73 y.o.   MRN: [id number]  DOS:  01/08/2019 Type of visit - description: Routine office visit In general feeling well HTN: Good compliance w/ medication, when she arrived to the office BP was low, recheck: 120/80. Asthma: Improving, sees Dr. Fredderick Phenix DM: Good compliance with medications  BP Readings from Last 3 Encounters:  01/08/19 120/80  01/05/19 (!) 124/57  08/19/18 (!) 145/101     Review of Systems Denies fever chills No chest pain no difficulty breathing No nausea, vomiting, diarrhea  Past Medical History:  Diagnosis Date  . Allergy    Environmental, foods: shellfish, lactose intolerant, citrus, peanuts  . Asthma    mild persistent  . Atrophic vaginitis   . BRCA gene mutation negative 2015   testing done at [facility]  . DJD (degenerative joint disease)    generalized joint pain, including back  . Family history of malignant neoplasm of breast   . Fibromyalgia    Dr Amil Amen  . GERD (gastroesophageal reflux disease)   . Gout    Idiopathic, chronic  . Hiatal hernia   . History of kidney stones   . Hypertension   . Palpitations    PSVT on Holter monitoring  . Shortness of breath    occ  . Stroke ([place])   . SUI (stress urinary incontinence, female)    (resolved after surgery)  . TIA (transient ischemic attack)    07/2012, see Dr Leonie Man  . Vertigo     Past Surgical History:  Procedure Laterality Date  . ABDOMINAL HYSTERECTOMY  1994   TAH,BSO-BLADDER NECK SUSPENSION  . BREAST SURGERY     LUMPECTOMY-BENIGN Breast Bx X2  . CARPAL TUNNEL RELEASE Right 2005  . CATARACT EXTRACTION, BILATERAL Bilateral 2016   Cataract   . CHOLECYSTECTOMY, LAPAROSCOPIC  2001  . CYSTOCELE REPAIR  1994  . KNEE ARTHROPLASTY  05/19/2012   Procedure: COMPUTER ASSISTED TOTAL KNEE ARTHROPLASTY;  Surgeon: Marybelle Killings, MD;  Location: [place];  Service: Orthopedics;  Laterality: Left;  Left  Total Knee Arthroplasty   . KNEE ARTHROPLASTY Right 09/03/2013   Procedure: COMPUTER ASSISTED TOTAL KNEE ARTHROPLASTY;  Surgeon: Marybelle Killings, MD;  Location: [place];  Service: Orthopedics;  Laterality: Right;  Right Total Knee Arthroplasty, Computer Assist  . KNEE ARTHROSCOPY     bil  . [place] TIBIAL TENDON  2003  . TONSILLECTOMY    . TRIPLE FUSION LEFT ANKLE Left 2007  . TUBAL LIGATION      Social History   Socioeconomic History  . Marital status: Married    Spouse name: [name]  . Number of children: 2  . Years of education: 81  . Highest education level: Not on file  Occupational History  . 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 Occupation: works part time, Engineer, manufacturing  . Financial resource strain: Not on file  . Food insecurity    Worry: Not on file    Inability: Not on file  . Transportation needs    Medical: Not on file    Non-medical: Not on file  Tobacco Use  . Smoking status: Never Smoker  . Smokeless tobacco: Never Used  Substance and Sexual Activity  . Alcohol use: No    Alcohol/week: 0.0 standard drinks  . Drug use: No  . Sexual activity: Not  Currently    Birth control/protection: Surgical  Lifestyle  . Physical activity    Days per week: Not on file    Minutes per session: Not on file  . Stress: Not on file  Relationships  . Social Herbalist on phone: Not on file    Gets together: Not on file    Attends religious service: Not on file    Active member of club or organization: Not on file    Attends meetings of clubs or organizations: Not on file    Relationship status: Not on file  . Intimate partner violence    Fear of current or ex partner: Not on file    Emotionally abused: Not on file    Physically abused: Not on file    Forced sexual activity: Not on file  Other Topics Concern  . 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 married '69. 2 sons - '70, '72:  retired - [employer]. SO- good health.   ACP - OK for CPR at least once, no long term ventilation, no heroic measures in the face of poor quality of life. HCPOA - husband, secondary son Lorijean Husser (c) [phone]          Allergies as of 01/08/2019      Reactions   Iodine Anaphylaxis   Shellfish Allergy Anaphylaxis   Sulfonamide Derivatives Rash   Other Swelling   Citrus  Sometimes Lactose intolerant   Amoxicillin Diarrhea      Medication List       Accurate as of January 08, 2019 11:59 PM. If you have any questions, ask your nurse or doctor.        STOP taking these medications   Flovent HFA 110 MCG/ACT inhaler Generic drug: fluticasone Stopped by: Kathlene November, MD     TAKE these medications   aspirin 325 MG tablet Take 1 tablet (325 mg total) by mouth daily.   Astepro 0.15 % Soln Generic drug: Azelastine HCl Place 2 sprays into the nose daily.   atorvastatin 10 MG tablet Commonly known as: LIPITOR Take 1 tablet (10 mg total) by mouth at bedtime.   Breo Ellipta 100-25 MCG/INH Aepb Generic drug: fluticasone furoate-vilanterol Inhale 1 puff into the lungs 2 (two) times daily.   diltiazem 240 MG 24 hr capsule Commonly known as: CARDIZEM CD Take 1 capsule (240 mg total) by mouth daily.   EpiPen 0.3 mg/0.3 mL Devi Generic drug: EPINEPHrine Inject 0.3 mg into the muscle as needed (anaphylactic). Reported on 06/26/2015   estradiol 0.1 MG/GM vaginal cream Commonly known as: ESTRACE Apply 1 gram per vagina every two times a week   febuxostat 40 MG tablet Commonly known as: ULORIC Take 80 mg by mouth daily.   fluticasone 50 MCG/ACT nasal spray Commonly known as: FLONASE Place 2 sprays into both nostrils daily.   furosemide 40 MG tablet Commonly known as: LASIX Take 1 tablet (40 mg total) by mouth daily.   HM Vitamin D3 50 MCG (2000 UT) Caps Generic drug: Cholecalciferol Take 2,000 Units by mouth daily.   levalbuterol 45 MCG/ACT inhaler Commonly known as: XOPENEX  HFA Inhale 2 puffs into the lungs every 4 (four) hours as needed for shortness of breath.   levocetirizine 5 MG tablet Commonly known as: XYZAL Take 5 mg by mouth daily.   losartan 25 MG tablet Commonly known as: COZAAR Take 2 tablets (50 mg total) by mouth daily.   metFORMIN  850 MG tablet Commonly known as: GLUCOPHAGE Take 1.5 tablets (1,275 mg total) by mouth 2 (two) times daily with a meal.   metoprolol succinate 50 MG 24 hr tablet Commonly known as: TOPROL-XL Take 1 tablet (50 mg total) by mouth daily. Take with or immediately following a meal.   nortriptyline 25 MG capsule Commonly known as: PAMELOR Take 25 mg by mouth at bedtime.   omeprazole 40 MG capsule Commonly known as: PRILOSEC Take 1 capsule (40 mg total) by mouth daily.   Potassium 99 MG Tabs Take by mouth.   Shingrix injection Generic drug: Zoster Vaccine Adjuvanted Inject 0.5 mLs into the muscle once for 1 dose. Started by: Kathlene November, MD   SM Coral Calcium 1000 (390 Ca) MG Tabs Generic drug: [place] Calcium Take by mouth.   vitamin E 400 UNIT capsule Take 400 Units by mouth daily.           Objective:   Physical Exam BP 120/80 (BP Location: Right Arm)   Pulse 100   Temp 98.1 F (36.7 C)   Resp 18   Ht _0  (1.651 m)   Wt 206 lb 4 oz (93.6 kg)   SpO2 95%   BMI 34.32 kg/m  General:   Well developed, NAD, BMI noted.  HEENT:  Normocephalic . Face symmetric, atraumatic Neck: No thyromegaly Lungs:  CTA B Normal respiratory effort, no intercostal retractions, no accessory muscle use. Heart: RRR,  no murmur.  no pretibial edema bilaterally  Abdomen:  Not distended, soft, non-tender. No rebound or rigidity.   Skin: Not pale. Not jaundice Neurologic:  alert & oriented X3.  Speech normal, gait appropriate for age and unassisted Psych--  Cognition and judgment appear intact.  Cooperative with normal attention span and concentration.  Behavior appropriate. No anxious or depressed  appearing.     Assessment    Assessment DM w/ h/o TIA HTN  Hyperlipidemia  Insomnia  Asthma , allergies-- sees Dr Harold Hedge  GERD, hiatal hernia , h/o IBS Obesity  Palpitations: PSVT by Holter, Dr Gwenlyn Found TIA, dx 2014,last visit w/  Dr. Leonie Man 09-2015, RTC prn ENT: Dr Arnette Norris -Vertigo (improved with vestibular rehabilitation 2015) -Asymmetric right ear sensorineural hearing loss with previously normal MRI.  Sees Dr. Benjamine Mola regularly. -Menier's (Dx   2020) Increased LFTs --- CT abdomen 2014: NormalLiver, negative hepatitis serologies at rheumatology per patient MSK: --Gout --DJD --Fibromyalgia, Dr. Amil Amen, on [place]  --Osteopenia  FH breast cancer , sister is BRCA2 (+), pt is  (-) GU: Kidney stones, h/o; Urinary incontinence; Atrophic vaginitis Hyperparathyroidism h/o slt increased PTH 2015 , normal PTH 05-2014  HOH- aids   PLAN: Preventive care reviewed DM: Continue with metformin, check A1c HTN: On losartan, metoprolol, Cardizem, Lasix.  BP upon arrival slightly low, recheck 120/80.  Recent BMP satisfactory.  Check CBC and TSH.  Doing well. High cholesterol: Started Lipitor 07-2018, good response, no change. Asthma: A week ago her allergies change Flovent to [place].  That seems to be working. Mnire's disease: Recently diagnosed by her ENT Recommend early flu shot RTC 6 to 7 months

## 2019-01-08 NOTE — Progress Notes (Signed)
Pre visit review using our clinic review tool, if applicable. No additional management support is needed unless otherwise documented below in the visit note. 

## 2019-01-08 NOTE — Patient Instructions (Signed)
GO TO THE LAB : Get the blood work     GO TO THE FRONT DESK Schedule your next appointment   for a checkup in 6 to 7 months

## 2019-01-09 DIAGNOSIS — H8111 Benign paroxysmal vertigo, right ear: Secondary | ICD-10-CM | POA: Diagnosis not present

## 2019-01-09 DIAGNOSIS — H832X1 Labyrinthine dysfunction, right ear: Secondary | ICD-10-CM | POA: Diagnosis not present

## 2019-01-09 DIAGNOSIS — R42 Dizziness and giddiness: Secondary | ICD-10-CM | POA: Diagnosis not present

## 2019-01-09 NOTE — Assessment & Plan Note (Signed)
Preventive care reviewed DM: Continue with metformin, check A1c HTN: On losartan, metoprolol, Cardizem, Lasix.  BP upon arrival slightly low, recheck 120/80.  Recent BMP satisfactory.  Check CBC and TSH.  Doing well. High cholesterol: Started Lipitor 07-2018, good response, no change. Asthma: A week ago her allergies change Flovent to [place].  That seems to be working. Mnire's disease: Recently diagnosed by her ENT Recommend early flu shot RTC 6 to 7 months

## 2019-01-10 MED ORDER — JARDIANCE 10 MG PO TABS: 10.0000 mg | ORAL_TABLET | Freq: Every day | ORAL | 5 refills | Status: DC

## 2019-01-10 NOTE — Addendum Note (Signed)
Addended byDamita Dunnings D on: 01/10/2019 03:50 PM   Modules accepted: Orders

## 2019-01-12 DIAGNOSIS — H832X1 Labyrinthine dysfunction, right ear: Secondary | ICD-10-CM | POA: Diagnosis not present

## 2019-01-12 DIAGNOSIS — R42 Dizziness and giddiness: Secondary | ICD-10-CM | POA: Diagnosis not present

## 2019-01-12 DIAGNOSIS — H8111 Benign paroxysmal vertigo, right ear: Secondary | ICD-10-CM | POA: Diagnosis not present

## 2019-01-16 DIAGNOSIS — H8111 Benign paroxysmal vertigo, right ear: Secondary | ICD-10-CM | POA: Diagnosis not present

## 2019-01-16 DIAGNOSIS — H832X1 Labyrinthine dysfunction, right ear: Secondary | ICD-10-CM | POA: Diagnosis not present

## 2019-01-16 DIAGNOSIS — R42 Dizziness and giddiness: Secondary | ICD-10-CM | POA: Diagnosis not present

## 2019-01-23 DIAGNOSIS — H832X1 Labyrinthine dysfunction, right ear: Secondary | ICD-10-CM | POA: Diagnosis not present

## 2019-01-23 DIAGNOSIS — H8111 Benign paroxysmal vertigo, right ear: Secondary | ICD-10-CM | POA: Diagnosis not present

## 2019-01-26 DIAGNOSIS — H832X1 Labyrinthine dysfunction, right ear: Secondary | ICD-10-CM | POA: Diagnosis not present

## 2019-01-26 DIAGNOSIS — R42 Dizziness and giddiness: Secondary | ICD-10-CM | POA: Diagnosis not present

## 2019-01-29 DIAGNOSIS — H832X1 Labyrinthine dysfunction, right ear: Secondary | ICD-10-CM | POA: Diagnosis not present

## 2019-01-29 DIAGNOSIS — R42 Dizziness and giddiness: Secondary | ICD-10-CM | POA: Diagnosis not present

## 2019-02-02 DIAGNOSIS — H832X1 Labyrinthine dysfunction, right ear: Secondary | ICD-10-CM | POA: Diagnosis not present

## 2019-02-02 DIAGNOSIS — R42 Dizziness and giddiness: Secondary | ICD-10-CM | POA: Diagnosis not present

## 2019-02-06 DIAGNOSIS — H832X1 Labyrinthine dysfunction, right ear: Secondary | ICD-10-CM | POA: Diagnosis not present

## 2019-02-06 DIAGNOSIS — R42 Dizziness and giddiness: Secondary | ICD-10-CM | POA: Diagnosis not present

## 2019-02-09 DIAGNOSIS — H832X1 Labyrinthine dysfunction, right ear: Secondary | ICD-10-CM | POA: Diagnosis not present

## 2019-02-09 DIAGNOSIS — R42 Dizziness and giddiness: Secondary | ICD-10-CM | POA: Diagnosis not present

## 2019-02-12 DIAGNOSIS — H832X1 Labyrinthine dysfunction, right ear: Secondary | ICD-10-CM | POA: Diagnosis not present

## 2019-02-12 DIAGNOSIS — R42 Dizziness and giddiness: Secondary | ICD-10-CM | POA: Diagnosis not present

## 2019-02-13 ENCOUNTER — Other Ambulatory Visit: Payer: Self-pay | Admitting: Otolaryngology

## 2019-02-13 DIAGNOSIS — H903 Sensorineural hearing loss, bilateral: Secondary | ICD-10-CM

## 2019-02-13 DIAGNOSIS — H905 Unspecified sensorineural hearing loss: Secondary | ICD-10-CM

## 2019-02-16 ENCOUNTER — Other Ambulatory Visit: Payer: Self-pay

## 2019-02-16 ENCOUNTER — Ambulatory Visit (INDEPENDENT_AMBULATORY_CARE_PROVIDER_SITE_OTHER): Payer: Medicare Other

## 2019-02-16 DIAGNOSIS — Z23 Encounter for immunization: Secondary | ICD-10-CM

## 2019-02-20 DIAGNOSIS — R42 Dizziness and giddiness: Secondary | ICD-10-CM | POA: Diagnosis not present

## 2019-02-20 DIAGNOSIS — H832X1 Labyrinthine dysfunction, right ear: Secondary | ICD-10-CM | POA: Diagnosis not present

## 2019-02-23 ENCOUNTER — Other Ambulatory Visit: Payer: Self-pay

## 2019-02-23 ENCOUNTER — Encounter (HOSPITAL_COMMUNITY): Payer: Self-pay | Admitting: Emergency Medicine

## 2019-02-23 ENCOUNTER — Ambulatory Visit: Payer: Self-pay | Admitting: *Deleted

## 2019-02-23 ENCOUNTER — Emergency Department (HOSPITAL_COMMUNITY): Payer: Medicare Other

## 2019-02-23 ENCOUNTER — Emergency Department (HOSPITAL_COMMUNITY)
Admission: EM | Admit: 2019-02-23 | Discharge: 2019-02-23 | Disposition: A | Discharge status: Home / Self Care | Payer: Medicare Other | Attending: Emergency Medicine | Admitting: Emergency Medicine

## 2019-02-23 DIAGNOSIS — Z7982 Long term (current) use of aspirin: Secondary | ICD-10-CM | POA: Insufficient documentation

## 2019-02-23 DIAGNOSIS — Z79899 Other long term (current) drug therapy: Secondary | ICD-10-CM | POA: Insufficient documentation

## 2019-02-23 DIAGNOSIS — R42 Dizziness and giddiness: Secondary | ICD-10-CM | POA: Diagnosis not present

## 2019-02-23 DIAGNOSIS — I10 Essential (primary) hypertension: Secondary | ICD-10-CM | POA: Insufficient documentation

## 2019-02-23 DIAGNOSIS — H832X1 Labyrinthine dysfunction, right ear: Secondary | ICD-10-CM | POA: Diagnosis not present

## 2019-02-23 DIAGNOSIS — I951 Orthostatic hypotension: Secondary | ICD-10-CM | POA: Diagnosis not present

## 2019-02-23 DIAGNOSIS — Z7984 Long term (current) use of oral hypoglycemic drugs: Secondary | ICD-10-CM | POA: Diagnosis not present

## 2019-02-23 DIAGNOSIS — J45909 Unspecified asthma, uncomplicated: Secondary | ICD-10-CM | POA: Diagnosis not present

## 2019-02-23 LAB — URINALYSIS, ROUTINE W REFLEX MICROSCOPIC
Bilirubin Urine: NEGATIVE
Glucose, UA: >=500 mg/dL — AB
Hgb urine dipstick: NEGATIVE
Ketones, ur: 5 mg/dL — AB
Leukocytes,Ua: NEGATIVE
Nitrite: NEGATIVE
Protein, ur: NEGATIVE mg/dL
Specific Gravity, Urine: 1.016 (ref 1.005–1.030)
pH: 5 (ref 5.0–8.0)

## 2019-02-23 LAB — COMPREHENSIVE METABOLIC PANEL
ALT: 47 U/L — ABNORMAL HIGH (ref 0–44)
AST: 30 U/L (ref 15–41)
Albumin: 4.5 g/dL (ref 3.5–5.0)
Alkaline Phosphatase: 70 U/L (ref 38–126)
Anion gap: 18 — ABNORMAL HIGH (ref 5–15)
BUN: 20 mg/dL (ref 8–23)
CO2: 22 mmol/L (ref 22–32)
Calcium: 9.7 mg/dL (ref 8.9–10.3)
Chloride: 99 mmol/L (ref 98–111)
Creatinine, Ser: 1.14 mg/dL — ABNORMAL HIGH (ref 0.44–1.00)
GFR calc Af Amer: 55 mL/min — ABNORMAL LOW (ref >60)
GFR calc non Af Amer: 47 mL/min — ABNORMAL LOW (ref >60)
Glucose, Bld: 150 mg/dL — ABNORMAL HIGH (ref 70–99)
Potassium: 4.2 mmol/L (ref 3.5–5.1)
Sodium: 139 mmol/L (ref 135–145)
Total Bilirubin: 0.8 mg/dL (ref 0.3–1.2)
Total Protein: 7.7 g/dL (ref 6.5–8.1)

## 2019-02-23 LAB — CBC WITH DIFFERENTIAL/PLATELET
Abs Immature Granulocytes: 0.14 10*3/uL — ABNORMAL HIGH (ref 0.00–0.07)
Basophils Absolute: 0.1 10*3/uL (ref 0.0–0.1)
Basophils Relative: 1 %
Eosinophils Absolute: 0.1 10*3/uL (ref 0.0–0.5)
Eosinophils Relative: 1 %
HCT: 45.6 % (ref 36.0–46.0)
Hemoglobin: 14.6 g/dL (ref 12.0–15.0)
Immature Granulocytes: 1 %
Lymphocytes Relative: 20 %
Lymphs Abs: 2.6 10*3/uL (ref 0.7–4.0)
MCH: 27 pg (ref 26.0–34.0)
MCHC: 32 g/dL (ref 30.0–36.0)
MCV: 84.4 fL (ref 80.0–100.0)
Monocytes Absolute: 0.9 10*3/uL (ref 0.1–1.0)
Monocytes Relative: 7 %
Neutro Abs: 9.3 10*3/uL — ABNORMAL HIGH (ref 1.7–7.7)
Neutrophils Relative %: 70 %
Platelets: 337 10*3/uL (ref 150–400)
RBC: 5.4 MIL/uL — ABNORMAL HIGH (ref 3.87–5.11)
RDW: 14.5 % (ref 11.5–15.5)
WBC: 13.2 10*3/uL — ABNORMAL HIGH (ref 4.0–10.5)
nRBC: 0 % (ref 0.0–0.2)

## 2019-02-23 MED ORDER — LACTATED RINGERS IV BOLUS: 1000.0000 mL | Freq: Once | INTRAVENOUS | Status: DC

## 2019-02-23 NOTE — ED Triage Notes (Signed)
Sent here from dr's office with hypotension-- was orthostatic in office. Is doing balance therapy and has had low BP-- 80/47 last night,.

## 2019-02-23 NOTE — ED Provider Notes (Signed)
I saw and evaluated the patient, reviewed the resident's note and I agree with the findings and plan.  Pertinent History: comes in with hypotension reported at PCP office with multipel antihypertensive meds - has hx of menierre's - doing PT for this - takes all of her BP meds in the morning including diltiazem, Lasix, losartan, metoprolol.  Pertinent Exam findings: Well-appearing without any altered mental status, clear heart and lung sounds, no tachycardia, normal pulses and no edema.  I was personally present and directly supervised the following procedures:  Medical evaluation.  The patient has had orthostatics here, she does have a decrease in her blood pressure when she stands.  She will be given some fluid and advised on her blood pressure medications.  I anticipate discharge unless other findings are found which would necessitate admission.   EKG Interpretation  Date/Time:  Friday February 23 2019 18:08:26 EDT Ventricular Rate:  82 PR Interval:    QRS Duration: 104 QT Interval:  398 QTC Calculation: 465 R Axis:   59 Text Interpretation:  Sinus rhythm Normal ECG since last tracing no significant change Confirmed by Noemi Chapel [phone]) on 02/23/2019 6:11:06 PM        I personally interpreted the EKG as well as the resident and agree with the interpretation on the resident's chart.  Final diagnoses:  Orthostatic hypotension      Noemi Chapel, MD 02/24/19 [phone]

## 2019-02-23 NOTE — ED Provider Notes (Addendum)
[facility] EMERGENCY DEPARTMENT Provider Note   CSN: [id number] Arrival date & time: 02/23/19  1420     History   Chief Complaint Chief Complaint  Patient presents with  . orthostatic  . sent by dr's office    HPI Kristy [name] is a 73 y.o. female.     HPI  The patient presents with hypotension while at physical therapy for her Meniere's Disease. She states that she has a history of orthostatic hypotension and frequently gets hypotensive and lightheaded upon standing. While at PT today, she was found to be hypotensive to 89/67 while sitting. She became weak and diaphoretic and had a lowest BP of 80/47. She was given two glasses of water to drink which resulted in improvement of her BP to 120/80. She denies syncope but did feel as if she was going to pass out.   The patient continued to hydrate throughout the afternoon and feels better on arrival to the ED. She denies chest pain, shortness of breath, nausea, lightheadedness or dizziness. She still feels that she gets lightheaded on standing. On chart review, the patient takes a number of medications for her blood pressure, to include diltiazem, lasix, losartan, and metoprolol succinate. She states that she takes all of these medications in the morning.   Past Medical History:  Diagnosis Date  . Allergy    Environmental, foods: shellfish, lactose intolerant, citrus, peanuts  . Asthma    mild persistent  . Atrophic vaginitis   . BRCA gene mutation negative 2015   testing done at [facility]  . DJD (degenerative joint disease)    generalized joint pain, including back  . Family history of malignant neoplasm of breast   . Fibromyalgia    Dr Amil Amen  . GERD (gastroesophageal reflux disease)   . Gout    Idiopathic, chronic  . Hiatal hernia   . History of kidney stones   . Hypertension   . Palpitations    PSVT on Holter monitoring  . Shortness of breath    occ  . Stroke ([place])   . SUI (stress urinary  incontinence, female)    (resolved after surgery)  . TIA (transient ischemic attack)    07/2012, see Dr Leonie Man  . Vertigo     Patient Active Problem List   Diagnosis Date Noted  . H/O vitamin D deficiency 09/21/2018  . Elevated LFTs 12/25/2015  . Dizzy 07/31/2015  . Follow-up -----------------PCP  notes  02/11/2015  . Hyperparathyroidism ([place]) 06/07/2014  . Benign paroxysmal positional vertigo 02/19/2014  . FHx: BRCA2 gene positive   . Family history of malignant neoplasm of breast   . Family history of breast cancer 01/10/2014  . Tachycardia 11/19/2013  . Osteoarthritis of right knee 09/03/2013  . Elevated LDL cholesterol level 06/11/2013  . Obesity, Class I, BMI 30.0-34.9 (see actual BMI) 06/11/2013  . Osteopenia 06/06/2013  . Menopause 01/09/2013  . History of TIA (transient ischemic attack) 07/15/2012  . Annual physical exam 05/06/2011  . SUI (stress urinary incontinence, female)   . Atrophic vaginitis   . GOUT, UNSPECIFIED 04/28/2010  . Diabetes ([place]) 04/28/2010  . LABYRINTHITIS, CHRONIC 07/30/2008  . Hearing loss 07/30/2008  . Essential hypertension 08/19/2007  . ALLERGIC RHINITIS 08/19/2007  . ASTHMA 08/19/2007  . ACID REFLUX DISEASE 08/19/2007  . HIATAL HERNIA 08/19/2007  . IRRITABLE BOWEL SYNDROME 08/19/2007  . DEGENERATIVE DISC DISEASE 08/19/2007  . FIBROMYALGIA 08/19/2007  . FATIGUE, CHRONIC 08/19/2007    Past Surgical History:  Procedure  Laterality Date  . ABDOMINAL HYSTERECTOMY  1994   TAH,BSO-BLADDER NECK SUSPENSION  . BREAST SURGERY     LUMPECTOMY-BENIGN Breast Bx X2  . CARPAL TUNNEL RELEASE Right 2005  . CATARACT EXTRACTION, BILATERAL Bilateral 2016   Cataract   . CHOLECYSTECTOMY, LAPAROSCOPIC  2001  . CYSTOCELE REPAIR  1994  . KNEE ARTHROPLASTY  05/19/2012   Procedure: COMPUTER ASSISTED TOTAL KNEE ARTHROPLASTY;  Surgeon: Marybelle Killings, MD;  Location: [facility];  Service: Orthopedics;  Laterality: Left;  Left  Total Knee Arthroplasty  . KNEE  ARTHROPLASTY Right 09/03/2013   Procedure: COMPUTER ASSISTED TOTAL KNEE ARTHROPLASTY;  Surgeon: Marybelle Killings, MD;  Location: [facility];  Service: Orthopedics;  Laterality: Right;  Right Total Knee Arthroplasty, Computer Assist  . KNEE ARTHROSCOPY     bil  . [place] TIBIAL TENDON  2003  . TONSILLECTOMY    . TRIPLE FUSION LEFT ANKLE Left 2007  . TUBAL LIGATION       OB History    Gravida  2   Para  2   Term      Preterm      AB      Living  2     SAB      TAB      Ectopic      Multiple      Live Births               Home Medications    Prior to Admission medications   Medication Sig Start Date End Date Taking? Authorizing Provider  aspirin 325 MG tablet Take 1 tablet (325 mg total) by mouth daily. Patient taking differently: Take 325 mg by mouth every morning.  07/16/12  Yes Norins, Heinz Knuckles, MD  atorvastatin (LIPITOR) 10 MG tablet Take 1 tablet (10 mg total) by mouth at bedtime. 09/29/18  Yes Paz, Alda Berthold, MD  Calcium Carbonate (CALCIUM 600 PO) Take 600 mg by mouth See admin instructions. Take one tablet by mouth every evening (4pm) and one tablet at bedtime   Yes [provider]  Cholecalciferol (HM VITAMIN D3) 2000 UNITS CAPS Take 2,000 Units by mouth at bedtime.    Yes [provider]  Coral Calcium (SM CORAL CALCIUM) 1000 (390 Ca) MG TABS Take by mouth.   Yes [provider]  diltiazem (CARDIZEM CD) 240 MG 24 hr capsule Take 1 capsule (240 mg total) by mouth daily. Patient taking differently: Take 240 mg by mouth every morning.  08/02/18  Yes Paz, Alda Berthold, MD  empagliflozin (JARDIANCE) 10 MG TABS tablet Take 10 mg by mouth daily before breakfast. 01/10/19  Yes Paz, Alda Berthold, MD  EPINEPHrine (EPIPEN) 0.3 mg/0.3 mL DEVI Inject 0.3 mg into the muscle See admin instructions. Inject 0.3 mg intramuscularly in the leg as needed for severe allergic reaction, call 911 and then inject again in the other leg. Reported on  06/26/2015   Yes [provider]  febuxostat (ULORIC) 40 MG tablet Take 40 mg by mouth every evening.    Yes [provider]  fluticasone (FLONASE) 50 MCG/ACT nasal spray Place 2 sprays into both nostrils every morning.  01/14/15  Yes [provider]  fluticasone furoate-vilanterol (BREO ELLIPTA) 100-25 MCG/INH AEPB Inhale 1 puff into the lungs every morning.   Yes [provider]  furosemide (LASIX) 40 MG tablet Take 1 tablet (40 mg total) by mouth daily. Patient taking differently: Take 40 mg  by mouth every morning.  07/31/18  Yes Paz, Alda Berthold, MD  levalbuterol [facility]) 45 MCG/ACT inhaler Inhale 2 puffs into the lungs every 4 (four) hours as needed for shortness of breath.    Yes [provider]  levocetirizine (XYZAL) 5 MG tablet Take 5 mg by mouth every evening.    Yes [provider]  losartan (COZAAR) 25 MG tablet Take 2 tablets (50 mg total) by mouth daily. Patient taking differently: Take 50 mg by mouth daily with breakfast.  10/06/18  Yes Colon Branch, MD  metFORMIN (GLUCOPHAGE) 850 MG tablet Take 1.5 tablets (1,275 mg total) by mouth 2 (two) times daily with a meal. 01/02/19  Yes Paz, Alda Berthold, MD  nortriptyline (PAMELOR) 25 MG capsule Take 25 mg by mouth every evening. 4pm   Yes [provider]  omeprazole (PRILOSEC) 40 MG capsule Take 1 capsule (40 mg total) by mouth daily. Patient taking differently: Take 40 mg by mouth every morning.  07/31/18  Yes Colon Branch, MD  Potassium 99 MG TABS Take 99 mg by mouth at bedtime.    Yes [provider]  vitamin E 400 UNIT capsule Take 400 Units by mouth every evening.    Yes [provider]  metoprolol succinate (TOPROL-XL) 50 MG 24 hr tablet Take 1 tablet (50 mg total) by mouth daily. Take with or immediately following a meal. Patient taking differently: Take 50 mg by mouth every morning. Take with or immediately following a meal. 06/29/18 02/23/19 Yes Paz, Alda Berthold, MD   estradiol (ESTRACE) 0.1 MG/GM vaginal cream Apply 1 gram per vagina every two times a week Patient not taking: Reported on 02/23/2019 07/07/16   Lavonia Drafts, MD    Family History Family History  Problem Relation Age of Onset  . Breast cancer Mother 10       deceased 59  . Breast cancer Sister 79       bilateral breast ca @ 34; BRCA2 positive  . Hypertension Sister   . Cancer Father        Brain tumor; path?; deceased 43  . Lymphoma Maternal Aunt        deceased 39s  . Hemochromatosis Sister   . Thalassemia Sister   . Other Sister        negative for BRCA1 BRCA2  . Breast cancer Cousin        BRCA2 positive  . Diabetes Other   . Breast cancer Other        distant maternal female relatives with breast cancer  . CAD Son        MI  . Heart failure Sister        due to chemo?  . Colon cancer Neg Hx     Social History Social History   Tobacco Use  . Smoking status: Never Smoker  . Smokeless tobacco: Never Used  Substance Use Topics  . Alcohol use: No    Alcohol/week: 0.0 standard drinks  . Drug use: No     Allergies   Iodine, Shellfish allergy, Sulfonamide derivatives, Citrus, Lactose intolerance (gi), and Amoxicillin   Review of Systems Review of Systems  Constitutional: Negative for chills and fever.  Eyes: Negative for visual disturbance.  Respiratory: Negative for cough and shortness of breath.   Cardiovascular: Negative for chest pain and palpitations.  Gastrointestinal: Negative for abdominal pain and vomiting.  Genitourinary: Negative for dysuria and hematuria.  Musculoskeletal: Negative for arthralgias.  Skin: Negative for rash.  Neurological: Positive for light-headedness. Negative for seizures and syncope.  All other systems reviewed and are negative.    Physical Exam Updated Vital Signs BP (!) 131/107   Pulse 87   Temp 98 F (36.7 C) (Oral)   Resp 18   Ht _0  (1.651 m)   Wt 93 kg   SpO2 96%   BMI 34.11 kg/m   Physical Exam  Vitals signs and nursing note reviewed.  Constitutional:      General: She is not in acute distress.    Appearance: She is well-developed.  HENT:     Head: Normocephalic and atraumatic.     Mouth/Throat:     Mouth: Mucous membranes are moist.  Eyes:     Conjunctiva/sclera: Conjunctivae normal.  Neck:     Musculoskeletal: Neck supple.  Cardiovascular:     Rate and Rhythm: Normal rate and regular rhythm.     Heart sounds: No murmur.  Pulmonary:     Effort: Pulmonary effort is normal. No respiratory distress.     Breath sounds: Normal breath sounds.  Abdominal:     Palpations: Abdomen is soft.     Tenderness: There is no abdominal tenderness.  Skin:    General: Skin is warm and dry.  Neurological:     General: No focal deficit present.     Mental Status: She is alert and oriented to person, place, and time.     Cranial Nerves: No cranial nerve deficit.     Sensory: No sensory deficit.     Motor: No weakness.     Coordination: Coordination normal.     Gait: Gait normal.      ED Treatments / Results  Labs (all labs ordered are listed, but only abnormal results are displayed) Labs Reviewed  CBC WITH DIFFERENTIAL/PLATELET - Abnormal; Notable for the following components:      Result Value   WBC 13.2 (*)    RBC 5.40 (*)    Neutro Abs 9.3 (*)    Abs Immature Granulocytes 0.14 (*)    All other components within normal limits  COMPREHENSIVE METABOLIC PANEL - Abnormal; Notable for the following components:   Glucose, Bld 150 (*)    Creatinine, Ser 1.14 (*)    ALT 47 (*)    GFR calc non Af Amer 47 (*)    GFR calc Af Amer 55 (*)    Anion gap 18 (*)    All other components within normal limits  URINALYSIS, ROUTINE W REFLEX MICROSCOPIC - Abnormal; Notable for the following components:   Glucose, UA >=500 (*)    Ketones, ur 5 (*)    Bacteria, UA RARE (*)    All other components within normal limits    EKG EKG Interpretation  Date/Time:  Friday February 23 2019  18:08:26 EDT Ventricular Rate:  82 PR Interval:    QRS Duration: 104 QT Interval:  398 QTC Calculation: 465 R Axis:   59 Text Interpretation:  Sinus rhythm Normal ECG since last tracing no significant change Confirmed by Noemi Chapel [phone]) on 02/23/2019 6:11:06 PM   Radiology Dg Chest Portable 1 View  Result Date: 02/23/2019 CLINICAL DATA:  Hypertension. Patient was orthostatic earlier today. EXAM: PORTABLE CHEST 1 VIEW COMPARISON:  August 19, 2018 FINDINGS: A hiatal hernia is identified. The heart, hila, mediastinum, lungs, and pleura are otherwise unremarkable. IMPRESSION: No active disease. Electronically Signed   By: Dorise Bullion III M.D   On: 02/23/2019 18:17    Procedures Procedures (including critical  care time)  Medications Ordered in ED Medications - No data to display   Initial Impression / Assessment and Plan / ED Course  I have reviewed the triage vital signs and the nursing notes.  Pertinent labs & imaging results that were available during my care of the patient were reviewed by me and considered in my medical decision making (see chart for details).        The patient presents to the ED with hypotension noted at physical therapy earlier today. On arrival, the patient was afebrile, HDS, in NAD. She denied chest pain. Orthostatics were performed with a drop in SBP from 120 to 100. She became lightheaded on standing. Findings are concerning for orthostatic hypotension. The patient denies abdominal pain and no pulsatile mass was appreciated on abdominal exam. She has no history of aortic stenosis and denies chest pain. She had normal mental status, clear heart and lung sounds, no tachycardia, and normal pulses on exam with intact capillary refill. She is on multiple blood pressure medications which may be worsening her symptoms. These include diltiazem, lasix, losartan, and metoprolol succinate. She states that she takes all of these medications in the morning.    Initial EKG with sinus rhythm, rate 82, no ischemic changes and no significant changes from prior EKGs. A CXR was performed and was without active disease.  The patient was hydrated orally in the emergency department and felt much better on reassessment. Vitals remained stable throughout her time in observation in the Emergency Department. I advised her to hold her home Metoprolol for now given her hypotension earlier today. Recommended that she follow-up with her PCP for a BP recheck and continued discussion regarding titration of her BP medications.   Final Clinical Impressions(s) / ED Diagnoses   Final diagnoses:  Orthostatic hypotension    ED Discharge Orders    None       Regan Lemming, MD 02/24/19 Luevenia Maxin    Regan Lemming, MD 02/24/19 Corine Shelter    Noemi Chapel, MD 02/24/19 0800

## 2019-02-23 NOTE — Telephone Encounter (Signed)
FYI

## 2019-02-23 NOTE — ED Notes (Signed)
Pt discharged to home... Instructions reviewed w/pt and husband at bedside... Verb understanding and answered any questions pt had... Ambulatory... no acute distress.... Signature pad not working at time of departure.

## 2019-02-23 NOTE — Telephone Encounter (Signed)
Physical therapist, Butch Penny is with patient now and is calling regarding her low b/p readings. She states that her b/p is 89/67 when she is sitting and can not hear it when she is standing. When she is lying it was 148/82. She has become weak and diaphoretic  when her b/p drops. She is on 3 different b/p medications and also is taking lasix. Her b/p has been as low as 80/47.  After drinking a couple more glasses of water her b/p is 120/80 and drops to 104/72 and pt reports that she is feeling weak.  LB at [facility] notified and Dr. Larose Kells also he is recommended that she goes to the ED to be checked.  Reece Packer of this and she voiced understanding.    Reason for Disposition . [id number] Fall in systolic BP > 20 mm Hg from normal AND [2] dizzy, lightheaded, or weak  Answer Assessment - Initial Assessment Questions 1. BLOOD PRESSURE: "What is the blood pressure?" "Did you take at least two measurements 5 minutes apart?"     Lying 148/82 and sitting 89/67  And 120/80 and standing 104/72 2. ONSET: "When did you take your blood pressure?"     now 3. HOW: "How did you obtain the blood pressure?" (e.g., visiting nurse, automatic home BP monitor)     BP monitor at the Physical Therapist. 4. HISTORY: "Do you have a history of low blood pressure?" "What is your blood pressure normally?"     No is on b/p medications 5. MEDICATIONS: "Are you taking any medications for blood pressure?" If yes: "Have they been changed recently?"     Yes and have not missed any 6. PULSE RATE: "Do you know what your pulse rate is?"      100-104 7. OTHER SYMPTOMS: "Have you been sick recently?" "Have you had a recent injury?"     no 8. PREGNANCY: "Is there any chance you are pregnant?" "When was your last menstrual period?"     n/a  Protocols used: LOW BLOOD PRESSURE-A-AH

## 2019-02-23 NOTE — Discharge Instructions (Addendum)
You likely have orthostatic hypotension. The medications you are taking for your blood pressure may be contributing to this. Please stop taking your metoprolol for now. Follow-up with your PCP in the next few days for a BP recheck and to further discuss your medications.

## 2019-02-23 NOTE — ED Notes (Signed)
Pt ambulated 50 ft to bathroom w/cane assist... steady gait... reports feeling fine while ambulating but felt dizzy when standing up from the toilet.... pt resting in room comfortably.

## 2019-02-24 ENCOUNTER — Telehealth: Payer: Self-pay | Admitting: Internal Medicine

## 2019-02-24 NOTE — Telephone Encounter (Signed)
Please arrange a ER follow-up in the next several days.

## 2019-02-26 NOTE — Telephone Encounter (Signed)
thx

## 2019-02-26 NOTE — Telephone Encounter (Signed)
Pt scheduled for ER f/u on 10/1.

## 2019-02-28 ENCOUNTER — Other Ambulatory Visit: Payer: Self-pay

## 2019-02-28 DIAGNOSIS — R42 Dizziness and giddiness: Secondary | ICD-10-CM | POA: Diagnosis not present

## 2019-02-28 DIAGNOSIS — H832X1 Labyrinthine dysfunction, right ear: Secondary | ICD-10-CM | POA: Diagnosis not present

## 2019-03-01 ENCOUNTER — Ambulatory Visit
Admission: RE | Admit: 2019-03-01 | Discharge: 2019-03-01 | Disposition: A | Discharge status: Home / Self Care | Payer: Medicare Other | Source: Ambulatory Visit | Attending: Otolaryngology | Admitting: Otolaryngology

## 2019-03-01 ENCOUNTER — Encounter: Payer: Self-pay | Admitting: Internal Medicine

## 2019-03-01 ENCOUNTER — Ambulatory Visit (INDEPENDENT_AMBULATORY_CARE_PROVIDER_SITE_OTHER): Payer: Medicare Other | Admitting: Internal Medicine

## 2019-03-01 VITALS — BP 125/67 | HR 114 | Temp 96.4°F | Resp 16 | Ht 65.0 in | Wt 205.4 lb

## 2019-03-01 DIAGNOSIS — I951 Orthostatic hypotension: Secondary | ICD-10-CM | POA: Diagnosis not present

## 2019-03-01 DIAGNOSIS — Z09 Encounter for follow-up examination after completed treatment for conditions other than malignant neoplasm: Secondary | ICD-10-CM

## 2019-03-01 DIAGNOSIS — H903 Sensorineural hearing loss, bilateral: Secondary | ICD-10-CM

## 2019-03-01 DIAGNOSIS — I6782 Cerebral ischemia: Secondary | ICD-10-CM | POA: Diagnosis not present

## 2019-03-01 DIAGNOSIS — H905 Unspecified sensorineural hearing loss: Secondary | ICD-10-CM

## 2019-03-01 MED ORDER — GADOBENATE DIMEGLUMINE 529 MG/ML IV SOLN
20.0000 mL | Freq: Once | INTRAVENOUS | Status: AC | PRN
  Administered 2019-03-01: 20 mL via INTRAVENOUS

## 2019-03-01 NOTE — Progress Notes (Signed)
Pre visit review using our clinic review tool, if applicable. No additional management support is needed unless otherwise documented below in the visit note. 

## 2019-03-01 NOTE — Progress Notes (Signed)
Subjective:    Patient ID: Kristy [name], female    DOB: [birth year], 73 y.o.   MRN: [id number]  DOS:  03/01/2019 Type of visit - description: ER follow-up evaluated on  02/23/2019   for hypotension  Uses multiple BP meds, the day prior to the ER visit her BP at home was 80/47.  The day of ER eval, she was doing physical therapy, reported dizziness, the PT did orthostatic vital signs the BP significantly dropped when she stood up thus went to the ER.   Work-up included:  Urinalysis negative, CMP show a creatinine of 1.4 (slightly elevated from baseline), ALT 47 slightly elevated, CBC with a white count of 13.2. Chest x-ray negative  She was orally hydrated, recommended to hold metoprolol and follow-up with PCP.    Wt Readings from Last 3 Encounters:  03/01/19 205 lb 6 oz (93.2 kg)  02/23/19 205 lb (93 kg)  01/08/19 206 lb 4 oz (93.6 kg)     Review of Systems  BP readings at home in the last 2-3 weeks: reports they are "all over the place" sometimes normal sometimes low. She started Jardiance 12-2018 Heart rate today is elevated, denies palpitation chest pain. She experienced occasional shortness of breath since March and that has no change. No fever chills but experience hot flashes She has severe positional dizziness, under the care of ENT.  Past Medical History:  Diagnosis Date  . Allergy    Environmental, foods: shellfish, lactose intolerant, citrus, peanuts  . Asthma    mild persistent  . Atrophic vaginitis   . BRCA gene mutation negative 2015   testing done at [facility]  . DJD (degenerative joint disease)    generalized joint pain, including back  . Family history of malignant neoplasm of breast   . Fibromyalgia    Dr Amil Amen  . GERD (gastroesophageal reflux disease)   . Gout    Idiopathic, chronic  . Hiatal hernia   . History of kidney stones   . Hypertension   . Palpitations    PSVT on Holter monitoring  . Shortness of breath    occ  . Stroke ([place])    . SUI (stress urinary incontinence, female)    (resolved after surgery)  . TIA (transient ischemic attack)    07/2012, see Dr Leonie Man  . Vertigo     Past Surgical History:  Procedure Laterality Date  . ABDOMINAL HYSTERECTOMY  1994   TAH,BSO-BLADDER NECK SUSPENSION  . BREAST SURGERY     LUMPECTOMY-BENIGN Breast Bx X2  . CARPAL TUNNEL RELEASE Right 2005  . CATARACT EXTRACTION, BILATERAL Bilateral 2016   Cataract   . CHOLECYSTECTOMY, LAPAROSCOPIC  2001  . CYSTOCELE REPAIR  1994  . KNEE ARTHROPLASTY  05/19/2012   Procedure: COMPUTER ASSISTED TOTAL KNEE ARTHROPLASTY;  Surgeon: Marybelle Killings, MD;  Location: [facility];  Service: Orthopedics;  Laterality: Left;  Left  Total Knee Arthroplasty  . KNEE ARTHROPLASTY Right 09/03/2013   Procedure: COMPUTER ASSISTED TOTAL KNEE ARTHROPLASTY;  Surgeon: Marybelle Killings, MD;  Location: [place];  Service: Orthopedics;  Laterality: Right;  Right Total Knee Arthroplasty, Computer Assist  . KNEE ARTHROSCOPY     bil  . [place] TIBIAL TENDON  2003  . TONSILLECTOMY    . TRIPLE FUSION LEFT ANKLE Left 2007  . TUBAL LIGATION      Social History   Socioeconomic History  . Marital status: Married  Spouse name: [name]  . Number of children: 2  . Years of education: 33  . Highest education level: Not on file  Occupational History  . 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 Occupation: works part time, Engineer, manufacturing  . Financial resource strain: Not on file  . Food insecurity    Worry: Not on file    Inability: Not on file  . Transportation needs    Medical: Not on file    Non-medical: Not on file  Tobacco Use  . Smoking status: Never Smoker  . Smokeless tobacco: Never Used  Substance and Sexual Activity  . Alcohol use: No    Alcohol/week: 0.0 standard drinks  . Drug use: No  . Sexual activity: Not Currently    Birth control/protection: Surgical  Lifestyle  . Physical activity    Days  per week: Not on file    Minutes per session: Not on file  . Stress: Not on file  Relationships  . Social Herbalist on phone: Not on file    Gets together: Not on file    Attends religious service: Not on file    Active member of club or organization: Not on file    Attends meetings of clubs or organizations: Not on file    Relationship status: Not on file  . Intimate partner violence    Fear of current or ex partner: Not on file    Emotionally abused: Not on file    Physically abused: Not on file    Forced sexual activity: Not on file  Other Topics Concern  . 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 married '69. 2 sons - '70, '72: retired - [employer]. SO- good health.   ACP - OK for CPR at least once, no long term ventilation, no heroic measures in the face of poor quality of life. HCPOA - husband, secondary son Jensine Luz (c) [phone]          Allergies as of 03/01/2019      Reactions   Iodine Anaphylaxis   Shellfish Allergy Anaphylaxis   Sulfonamide Derivatives Rash   Citrus Hives, Itching, Other (See Comments)   Itching in mouth/ blisters on lips   Lactose Intolerance (gi) Diarrhea, Swelling   Swollen mouth   Amoxicillin Diarrhea   Did it involve swelling of the face/tongue/throat, SOB, or low BP? No Did it involve sudden or severe rash/hives, skin peeling, or any reaction on the inside of your mouth or nose? No Did you need to seek medical attention at a hospital or doctor's office? No When did it last happen?68-35 years old If all above answers are "NO", may proceed with cephalosporin use.      Medication List       Accurate as of March 01, 2019 11:59 PM. If you have any questions, ask your nurse or doctor.        aspirin 325 MG tablet Take 1 tablet (325 mg total) by mouth daily. What changed: when to take this   atorvastatin 10 MG tablet Commonly known as: LIPITOR Take 1 tablet (10 mg total) by mouth at  bedtime.   Breo Ellipta 100-25 MCG/INH Aepb Generic drug: fluticasone furoate-vilanterol Inhale 1 puff into the lungs every morning.   CALCIUM 600 PO Take 600 mg by mouth See admin instructions. Take one tablet by mouth every evening (4pm) and one tablet at  bedtime   diltiazem 240 MG 24 hr capsule Commonly known as: CARDIZEM CD Take 1 capsule (240 mg total) by mouth daily. What changed: when to take this   EpiPen 0.3 mg/0.3 mL Devi Generic drug: EPINEPHrine Inject 0.3 mg into the muscle See admin instructions. Inject 0.3 mg intramuscularly in the leg as needed for severe allergic reaction, call 911 and then inject again in the other leg. Reported on 06/26/2015   estradiol 0.1 MG/GM vaginal cream Commonly known as: ESTRACE Apply 1 gram per vagina every two times a week   febuxostat 40 MG tablet Commonly known as: ULORIC Take 40 mg by mouth every evening.   fluticasone 50 MCG/ACT nasal spray Commonly known as: FLONASE Place 2 sprays into both nostrils every morning.   furosemide 40 MG tablet Commonly known as: LASIX Take 0.5 tablets (20 mg total) by mouth daily. What changed: how much to take Changed by: Kathlene November, MD   HM Vitamin D3 50 MCG (2000 UT) Caps Generic drug: Cholecalciferol Take 2,000 Units by mouth at bedtime.   Jardiance 10 MG Tabs tablet Generic drug: empagliflozin Take 10 mg by mouth daily before breakfast.   levalbuterol 45 MCG/ACT inhaler Commonly known as: XOPENEX HFA Inhale 2 puffs into the lungs every 4 (four) hours as needed for shortness of breath.   levocetirizine 5 MG tablet Commonly known as: XYZAL Take 5 mg by mouth every evening.   losartan 25 MG tablet Commonly known as: COZAAR Take 1 tablet (25 mg total) by mouth daily. What changed: how much to take Changed by: Kathlene November, MD   metFORMIN 850 MG tablet Commonly known as: GLUCOPHAGE Take 1.5 tablets (1,275 mg total) by mouth 2 (two) times daily with a meal.   metoprolol succinate 50  MG 24 hr tablet Commonly known as: TOPROL-XL Take 1 tablet (50 mg total) by mouth daily. Take with or immediately following a meal.   nortriptyline 25 MG capsule Commonly known as: PAMELOR Take 25 mg by mouth every evening. 4pm   omeprazole 40 MG capsule Commonly known as: PRILOSEC Take 1 capsule (40 mg total) by mouth daily. What changed: when to take this   Potassium 99 MG Tabs Take 99 mg by mouth at bedtime.   SM Coral Calcium 1000 (390 Ca) MG Tabs Generic drug: Coral Calcium Take by mouth.   vitamin E 400 UNIT capsule Take 400 Units by mouth every evening.           Objective:   Physical Exam BP 125/67 (BP Location: Left Arm, Patient Position: Sitting, Cuff Size: Normal)   Pulse (!) 114   Temp (!) 96.4 F (35.8 C) (Temporal)   Resp 16   Ht _0  (1.651 m)   Wt 205 lb 6 oz (93.2 kg)   SpO2 93%   BMI 34.18 kg/m   Lying down: BP 143/74, pulse 112 Standing: BP 128/67, pulse 126, she got dizzy General:   Well developed, NAD, BMI noted. HEENT:  Normocephalic . Face symmetric, atraumatic Lungs:  CTA B Normal respiratory effort, no intercostal retractions, no accessory muscle use. Heart: RRR,  no murmur.  No pretibial edema bilaterally  Skin: Not pale. Not jaundice Neurologic:  alert & oriented X3.  Speech normal, gait appropriate for age and unassisted Psych--  Cognition and judgment appear intact.  Cooperative with normal attention span and concentration.  Behavior appropriate. No anxious or depressed appearing.      Assessment     Assessment DM w/ h/o TIA HTN  Hyperlipidemia  Insomnia  Asthma , allergies-- sees Dr Harold Hedge  GERD, hiatal hernia , h/o IBS Obesity  Palpitations: PSVT by Holter, Dr Gwenlyn Found TIA, dx 2014,last visit w/  Dr. Leonie Man 09-2015, RTC prn ENT: Dr Arnette Norris -Vertigo (improved with vestibular rehabilitation 2015) -Asymmetric right ear sensorineural hearing loss with previously normal MRI.   -Menier's (Dx   2020) Increased LFTs ---  CT abdomen 2014: NormalLiver, negative hepatitis serologies at rheumatology per patient MSK: Gout, DJD, FM-Fibromyalgia (Dr. Amil Amen, on Pamelor  --Osteopenia  FH breast cancer , sister is BRCA2 (+), pt is  (-) GU: Kidney stones, h/o; Urinary incontinence; Atrophic vaginitis Hyperparathyroidism h/o slt increased PTH 2015 , normal PTH 05-2014  HOH- aids   PLAN: Orthostatic hypotension: Found to have orthostatic hypotension doing physical therapy, went to the ER, work-up essentially negative, was Rx to  hold metoprolol. Patient has multiple medical problems including HTN, PSVT,  Mnire's on multiple medications including Cardizem, Lasix, losartan.    It is hard to assert if dizziness is due to low blood pressure or Mnire's, for instance today her BP did not change yet she got dizzy. Today is slt tachycardic, probably d/t holding BBs jardiance may be playing a role on her orthostatic BP. Plan: Restart metoprolol Decrease Lasix 40 mg to half tablet daily Decrease losartan 25 mg to only 1 tablet daily Ambulatory BPs, see AVS BMP in 10 days, good hydration. Leukocytosis: Recheck in 10 days, consider hematology referral. RTC 10 days for blood work RTC to see me: 6 weeks

## 2019-03-01 NOTE — Patient Instructions (Addendum)
Per our records you are due for an eye exam. Please contact your eye doctor to schedule an appointment. Please have them send copies of your office visit notes to [place]. Our fax number is [phone].     GO TO THE FRONT DESK Schedule your next appointment for blood work 10 days from today  Please come back and see me in 6 weeks  Restart metoprolol Decrease Lasix 40 mg to half tablet daily Decrease losartan 25 mg to only 1 tablet daily   Check the  blood pressure daily, call in 1 week with readings BP GOAL is between 110/65 and  135/85. If it is consistently higher or lower, let me know

## 2019-03-02 NOTE — Assessment & Plan Note (Addendum)
Orthostatic hypotension: Found to have orthostatic hypotension doing physical therapy, went to the ER, work-up essentially negative, was Rx to  hold metoprolol. Patient has multiple medical problems including HTN, PSVT,  Mnire's on multiple medications including Cardizem, Lasix, losartan.    It is hard to assert if dizziness is due to low blood pressure or Mnire's, for instance today her BP did not change yet she got dizzy. Today is slt tachycardic, probably d/t holding BBs jardiance may be playing a role on her orthostatic BP. Plan: Restart metoprolol Decrease Lasix 40 mg to half tablet daily Decrease losartan 25 mg to only 1 tablet daily Ambulatory BPs, see AVS BMP in 10 days, good hydration. Leukocytosis: Recheck in 10 days, consider hematology referral. RTC 10 days for blood work RTC to see me: 6 weeks

## 2019-03-07 DIAGNOSIS — H832X1 Labyrinthine dysfunction, right ear: Secondary | ICD-10-CM | POA: Diagnosis not present

## 2019-03-07 DIAGNOSIS — R42 Dizziness and giddiness: Secondary | ICD-10-CM | POA: Diagnosis not present

## 2019-03-12 ENCOUNTER — Other Ambulatory Visit: Payer: Self-pay

## 2019-03-12 DIAGNOSIS — I10 Essential (primary) hypertension: Secondary | ICD-10-CM

## 2019-03-13 ENCOUNTER — Other Ambulatory Visit: Payer: Self-pay

## 2019-03-13 ENCOUNTER — Other Ambulatory Visit (INDEPENDENT_AMBULATORY_CARE_PROVIDER_SITE_OTHER): Payer: Medicare Other

## 2019-03-13 DIAGNOSIS — I10 Essential (primary) hypertension: Secondary | ICD-10-CM

## 2019-03-13 LAB — BASIC METABOLIC PANEL
BUN: 15 mg/dL (ref 6–23)
CO2: 25 mEq/L (ref 19–32)
Calcium: 10.1 mg/dL (ref 8.4–10.5)
Chloride: 103 mEq/L (ref 96–112)
Creatinine, Ser: 0.93 mg/dL (ref 0.40–1.20)
GFR: 58.88 mL/min — ABNORMAL LOW (ref >60.00)
Glucose, Bld: 133 mg/dL — ABNORMAL HIGH (ref 70–99)
Potassium: 4.2 mEq/L (ref 3.5–5.1)
Sodium: 140 mEq/L (ref 135–145)

## 2019-03-15 ENCOUNTER — Other Ambulatory Visit: Payer: Self-pay

## 2019-03-15 DIAGNOSIS — D72829 Elevated white blood cell count, unspecified: Secondary | ICD-10-CM

## 2019-03-16 ENCOUNTER — Other Ambulatory Visit: Payer: Self-pay

## 2019-03-16 ENCOUNTER — Other Ambulatory Visit (INDEPENDENT_AMBULATORY_CARE_PROVIDER_SITE_OTHER): Payer: Medicare Other

## 2019-03-16 DIAGNOSIS — D72829 Elevated white blood cell count, unspecified: Secondary | ICD-10-CM

## 2019-03-16 LAB — CBC WITH DIFFERENTIAL/PLATELET
Basophils Absolute: 0.1 10*3/uL (ref 0.0–0.1)
Basophils Relative: 1 % (ref 0.0–3.0)
Eosinophils Absolute: 0.3 10*3/uL (ref 0.0–0.7)
Eosinophils Relative: 3.1 % (ref 0.0–5.0)
HCT: 40.3 % (ref 36.0–46.0)
Hemoglobin: 13.2 g/dL (ref 12.0–15.0)
Lymphocytes Relative: 38.7 % (ref 12.0–46.0)
Lymphs Abs: 3.4 10*3/uL (ref 0.7–4.0)
MCHC: 32.6 g/dL (ref 30.0–36.0)
MCV: 82.5 fl (ref 78.0–100.0)
Monocytes Absolute: 0.8 10*3/uL (ref 0.1–1.0)
Monocytes Relative: 9.1 % (ref 3.0–12.0)
Neutro Abs: 4.2 10*3/uL (ref 1.4–7.7)
Neutrophils Relative %: 48.1 % (ref 43.0–77.0)
Platelets: 301 10*3/uL (ref 150.0–400.0)
RBC: 4.89 Mil/uL (ref 3.87–5.11)
RDW: 14.7 % (ref 11.5–15.5)
WBC: 8.7 10*3/uL (ref 4.0–10.5)

## 2019-04-11 ENCOUNTER — Other Ambulatory Visit: Payer: Self-pay

## 2019-04-12 ENCOUNTER — Encounter: Payer: Self-pay | Admitting: Internal Medicine

## 2019-04-12 ENCOUNTER — Ambulatory Visit (INDEPENDENT_AMBULATORY_CARE_PROVIDER_SITE_OTHER): Payer: Medicare Other | Admitting: Internal Medicine

## 2019-04-12 VITALS — BP 117/58 | HR 95 | Temp 96.5°F | Resp 16 | Ht 65.0 in | Wt 204.0 lb

## 2019-04-12 DIAGNOSIS — I10 Essential (primary) hypertension: Secondary | ICD-10-CM

## 2019-04-12 DIAGNOSIS — R42 Dizziness and giddiness: Secondary | ICD-10-CM | POA: Diagnosis not present

## 2019-04-12 DIAGNOSIS — I951 Orthostatic hypotension: Secondary | ICD-10-CM

## 2019-04-12 DIAGNOSIS — E1169 Type 2 diabetes mellitus with other specified complication: Secondary | ICD-10-CM

## 2019-04-12 NOTE — Patient Instructions (Addendum)
Per our records you are due for an eye exam. Please contact your eye doctor to schedule an appointment. Please have them send copies of your office visit notes to [place]. Our fax number is [phone].   GO TO THE FRONT DESK Schedule your next appointment  For a check up in 3 months    Continue checkin your blood pressure  BP GOAL is between 110/65 and  135/85. If it is consistently higher or lower, let me know

## 2019-04-12 NOTE — Progress Notes (Signed)
Subjective:    Patient ID: Kristy [name], female    DOB: 11-06-1945, 73 y.o.   MRN: [id number]  DOS:  04/12/2019 Type of visit - description: Follow-up from previous visit She was last seen with orthostatic problems. BP meds adjusted. Ambulatory BPs gradually improve and they have not been low in the last couple of weeks. Today is 117/58, the lowest has been in 2 weeks. Heart rate typically in the 80s to 90s. She remains dizziness but  is her "normal dizziness" worse with certain movements of the head and sometimes even when she  stands up but is different from the orthostatic symptoms she was having. DM: Ambulatory CBGs in the 120s Gait disorder: Due to multiple medical problems he has a very hard time walking steadily, uses a cane but that does not seem to be enough to feel secure.  She wonders about a motorized chair.  Review of Systems Denies chest pain or palpitations.  No edema  Past Medical History:  Diagnosis Date  . Allergy    Environmental, foods: shellfish, lactose intolerant, citrus, peanuts  . Asthma    mild persistent  . Atrophic vaginitis   . BRCA gene mutation negative 2015   testing done at [facility]  . DJD (degenerative joint disease)    generalized joint pain, including back  . Family history of malignant neoplasm of breast   . Fibromyalgia    Dr Amil Amen  . GERD (gastroesophageal reflux disease)   . Gout    Idiopathic, chronic  . Hiatal hernia   . History of kidney stones   . Hypertension   . Palpitations    PSVT on Holter monitoring  . Shortness of breath    occ  . Stroke ([place])   . SUI (stress urinary incontinence, female)    (resolved after surgery)  . TIA (transient ischemic attack)    07/2012, see Dr Leonie Man  . Vertigo     Past Surgical History:  Procedure Laterality Date  . ABDOMINAL HYSTERECTOMY  1994   TAH,BSO-BLADDER NECK SUSPENSION  . BREAST SURGERY     LUMPECTOMY-BENIGN Breast Bx X2  . CARPAL TUNNEL RELEASE Right 2005  . CATARACT  EXTRACTION, BILATERAL Bilateral 2016   Cataract   . CHOLECYSTECTOMY, LAPAROSCOPIC  2001  . CYSTOCELE REPAIR  1994  . KNEE ARTHROPLASTY  05/19/2012   Procedure: COMPUTER ASSISTED TOTAL KNEE ARTHROPLASTY;  Surgeon: Marybelle Killings, MD;  Location: [facility];  Service: Orthopedics;  Laterality: Left;  Left  Total Knee Arthroplasty  . KNEE ARTHROPLASTY Right 09/03/2013   Procedure: COMPUTER ASSISTED TOTAL KNEE ARTHROPLASTY;  Surgeon: Marybelle Killings, MD;  Location: [place];  Service: Orthopedics;  Laterality: Right;  Right Total Knee Arthroplasty, Computer Assist  . KNEE ARTHROSCOPY     bil  . [place] TIBIAL TENDON  2003  . TONSILLECTOMY    . TRIPLE FUSION LEFT ANKLE Left 2007  . TUBAL LIGATION      Social History   Socioeconomic History  . Marital status: Married    Spouse name: [name]  . Number of children: 2  . Years of education: 28  . Highest education level: Not on file  Occupational History  . 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 Occupation: works part time, Engineer, manufacturing  . Financial resource strain: Not on file  . Food insecurity    Worry: Not on file  Inability: Not on file  . Transportation needs    Medical: Not on file    Non-medical: Not on file  Tobacco Use  . Smoking status: Never Smoker  . Smokeless tobacco: Never Used  Substance and Sexual Activity  . Alcohol use: No    Alcohol/week: 0.0 standard drinks  . Drug use: No  . Sexual activity: Not Currently    Birth control/protection: Surgical  Lifestyle  . Physical activity    Days per week: Not on file    Minutes per session: Not on file  . Stress: Not on file  Relationships  . Social Herbalist on phone: Not on file    Gets together: Not on file    Attends religious service: Not on file    Active member of club or organization: Not on file    Attends meetings of clubs or organizations: Not on file    Relationship status: Not on  file  . Intimate partner violence    Fear of current or ex partner: Not on file    Emotionally abused: Not on file    Physically abused: Not on file    Forced sexual activity: Not on file  Other Topics Concern  . 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 married '69. 2 sons - '70, '72: retired - [employer]. SO- good health.   ACP - OK for CPR at least once, no long term ventilation, no heroic measures in the face of poor quality of life. HCPOA - husband, secondary son Shaquila Sigman (c) [phone]          Allergies as of 04/12/2019      Reactions   Iodine Anaphylaxis   Shellfish Allergy Anaphylaxis   Sulfonamide Derivatives Rash   Citrus Hives, Itching, Other (See Comments)   Itching in mouth/ blisters on lips   Lactose Intolerance (gi) Diarrhea, Swelling   Swollen mouth   Amoxicillin Diarrhea   Did it involve swelling of the face/tongue/throat, SOB, or low BP? No Did it involve sudden or severe rash/hives, skin peeling, or any reaction on the inside of your mouth or nose? No Did you need to seek medical attention at a hospital or doctor's office? No When did it last happen?64-30 years old If all above answers are "NO", may proceed with cephalosporin use.      Medication List       Accurate as of April 12, 2019 11:59 PM. If you have any questions, ask your nurse or doctor.        aspirin 325 MG tablet Take 1 tablet (325 mg total) by mouth daily. What changed: when to take this   atorvastatin 10 MG tablet Commonly known as: LIPITOR Take 1 tablet (10 mg total) by mouth at bedtime.   Breo Ellipta 100-25 MCG/INH Aepb Generic drug: fluticasone furoate-vilanterol Inhale 1 puff into the lungs every morning.   CALCIUM 600 PO Take 600 mg by mouth See admin instructions. Take one tablet by mouth every evening (4pm) and one tablet at bedtime   diltiazem 240 MG 24 hr capsule Commonly known as: CARDIZEM CD Take 1 capsule (240 mg total) by  mouth daily. What changed: when to take this   EpiPen 0.3 mg/0.3 mL Devi Generic drug: EPINEPHrine Inject 0.3 mg into the muscle See admin instructions. Inject 0.3 mg intramuscularly in the leg as needed for severe allergic reaction, call 911 and then inject again in the other leg.  Reported on 06/26/2015   estradiol 0.1 MG/GM vaginal cream Commonly known as: ESTRACE Apply 1 gram per vagina every two times a week   febuxostat 40 MG tablet Commonly known as: ULORIC Take 40 mg by mouth every evening.   fluticasone 50 MCG/ACT nasal spray Commonly known as: FLONASE Place 2 sprays into both nostrils every morning.   furosemide 40 MG tablet Commonly known as: LASIX Take 0.5 tablets (20 mg total) by mouth daily.   HM Vitamin D3 50 MCG (2000 UT) Caps Generic drug: Cholecalciferol Take 2,000 Units by mouth at bedtime.   Jardiance 10 MG Tabs tablet Generic drug: empagliflozin Take 10 mg by mouth daily before breakfast.   levalbuterol 45 MCG/ACT inhaler Commonly known as: XOPENEX HFA Inhale 2 puffs into the lungs every 4 (four) hours as needed for shortness of breath.   levocetirizine 5 MG tablet Commonly known as: XYZAL Take 5 mg by mouth every evening.   losartan 25 MG tablet Commonly known as: COZAAR Take 1 tablet (25 mg total) by mouth daily.   metFORMIN 850 MG tablet Commonly known as: GLUCOPHAGE Take 1.5 tablets (1,275 mg total) by mouth 2 (two) times daily with a meal.   metoprolol succinate 50 MG 24 hr tablet Commonly known as: TOPROL-XL Take 1 tablet (50 mg total) by mouth daily. Take with or immediately following a meal.   nortriptyline 25 MG capsule Commonly known as: PAMELOR Take 25 mg by mouth every evening. 4pm   omeprazole 40 MG capsule Commonly known as: PRILOSEC Take 1 capsule (40 mg total) by mouth daily. What changed: when to take this   Potassium 99 MG Tabs Take 99 mg by mouth at bedtime.   SM Coral Calcium 1000 (390 Ca) MG Tabs Generic drug:  Coral Calcium Take by mouth.   vitamin E 400 UNIT capsule Take 400 Units by mouth every evening.           Objective:   Physical Exam BP (!) 117/58 (BP Location: Left Arm, Patient Position: Sitting, Cuff Size: Normal)   Pulse 95   Temp (!) 96.5 F (35.8 C) (Temporal)   Resp 16   Ht '5\' 5"'  (1.651 m)   Wt 204 lb (92.5 kg)   SpO2 92%   BMI 33.95 kg/m  General:   Well developed, NAD, BMI noted. HEENT:  Normocephalic . Face symmetric, atraumatic Lungs:  CTA B Normal respiratory effort, no intercostal retractions, no accessory muscle use. Heart: RRR,  no murmur.  No pretibial edema bilaterally  Skin: Not pale. Not jaundice Neurologic:  alert & oriented X3.  Speech normal  Psych--  Cognition and judgment appear intact.  Cooperative with normal attention span and concentration.  Behavior appropriate. No anxious or depressed appearing.      Assessment       Assessment DM w/ h/o TIA HTN  Hyperlipidemia  Insomnia  Asthma , allergies-- sees Dr Harold Hedge  GERD, hiatal hernia , h/o IBS Obesity  Palpitations: PSVT by Holter, Dr Gwenlyn Found TIA, dx 2014,last visit w/  Dr. Leonie Man 09-2015, RTC prn ENT: Dr Arnette Norris -Vertigo (improved with vestibular rehabilitation 2015) -Asymmetric right ear sensorineural hearing loss with previously normal MRI.   -Menier's (Dx   2020) Increased LFTs --- CT abdomen 2014: NormalLiver, negative hepatitis serologies at rheumatology per patient MSK: Gout, DJD, FM-Fibromyalgia (Dr. Amil Amen, on Pamelor  --Osteopenia  FH breast cancer , sister is BRCA2 (+), pt is  (-) GU: Kidney stones, h/o; Urinary incontinence; Atrophic vaginitis Hyperparathyroidism h/o slt increased  PTH 2015 , normal PTH 05-2014  HOH- aids   PLAN: Orthostatic hypotension: Resolved HTN: Since the last visit, BPs gradually got better and she has no longer low blood pressure.  Last BMP satisfactory.  Continue losartan 25 mg, metoprolol 50 mg, Cardizem 240 mg, Lasix half tablet (20 mg)  Dizziness, vertigo: Back to baseline DM: Good compliance to medication.  Needs a A1c on RTC Gait disorder due to multiple MSK issues & vertigo her gait is very unsteady.  Has a very hard time getting out of her house despite using a cane.  She would like to use a motorized chair, due to shoulder DJD cannot use a manual wheelchair.  I will support her request for a motorized chair or scooter. RTC 3 months

## 2019-04-12 NOTE — Progress Notes (Signed)
Pre visit review using our clinic review tool, if applicable. No additional management support is needed unless otherwise documented below in the visit note. 

## 2019-04-14 NOTE — Assessment & Plan Note (Signed)
Orthostatic hypotension: Resolved HTN: Since the last visit, BPs gradually got better and she has no longer low blood pressure.  Last BMP satisfactory.  Continue losartan 25 mg, metoprolol 50 mg, Cardizem 240 mg, Lasix half tablet (20 mg) Dizziness, vertigo: Back to baseline DM: Good compliance to medication.  Needs a A1c on RTC Gait disorder due to multiple MSK issues & vertigo her gait is very unsteady.  Has a very hard time getting out of her house despite using a cane.  She would like to use a motorized chair, due to shoulder DJD cannot use a manual wheelchair.  I will support her request for a motorized chair or scooter. RTC 3 months

## 2019-04-23 DIAGNOSIS — Z6834 Body mass index (BMI) 34.0-34.9, adult: Secondary | ICD-10-CM | POA: Diagnosis not present

## 2019-04-23 DIAGNOSIS — M150 Primary generalized (osteo)arthritis: Secondary | ICD-10-CM | POA: Diagnosis not present

## 2019-04-23 DIAGNOSIS — M1A09X0 Idiopathic chronic gout, multiple sites, without tophus (tophi): Secondary | ICD-10-CM | POA: Diagnosis not present

## 2019-04-23 DIAGNOSIS — R945 Abnormal results of liver function studies: Secondary | ICD-10-CM | POA: Diagnosis not present

## 2019-04-23 DIAGNOSIS — M5136 Other intervertebral disc degeneration, lumbar region: Secondary | ICD-10-CM | POA: Diagnosis not present

## 2019-04-23 DIAGNOSIS — M65321 Trigger finger, right index finger: Secondary | ICD-10-CM | POA: Diagnosis not present

## 2019-04-23 DIAGNOSIS — M797 Fibromyalgia: Secondary | ICD-10-CM | POA: Diagnosis not present

## 2019-04-23 DIAGNOSIS — E669 Obesity, unspecified: Secondary | ICD-10-CM | POA: Diagnosis not present

## 2019-05-21 ENCOUNTER — Other Ambulatory Visit: Payer: Self-pay | Admitting: Internal Medicine

## 2019-06-20 ENCOUNTER — Other Ambulatory Visit: Payer: Self-pay | Admitting: Internal Medicine

## 2019-06-28 DIAGNOSIS — H1045 Other chronic allergic conjunctivitis: Secondary | ICD-10-CM | POA: Diagnosis not present

## 2019-06-28 DIAGNOSIS — J3081 Allergic rhinitis due to animal (cat) (dog) hair and dander: Secondary | ICD-10-CM | POA: Diagnosis not present

## 2019-06-28 DIAGNOSIS — J301 Allergic rhinitis due to pollen: Secondary | ICD-10-CM | POA: Diagnosis not present

## 2019-06-28 DIAGNOSIS — J3089 Other allergic rhinitis: Secondary | ICD-10-CM | POA: Diagnosis not present

## 2019-06-29 ENCOUNTER — Other Ambulatory Visit: Payer: Self-pay | Admitting: Internal Medicine

## 2019-07-13 DIAGNOSIS — Z23 Encounter for immunization: Secondary | ICD-10-CM | POA: Diagnosis not present

## 2019-07-18 ENCOUNTER — Other Ambulatory Visit: Payer: Self-pay | Admitting: Internal Medicine

## 2019-07-24 DIAGNOSIS — Z961 Presence of intraocular lens: Secondary | ICD-10-CM | POA: Diagnosis not present

## 2019-07-24 DIAGNOSIS — H43393 Other vitreous opacities, bilateral: Secondary | ICD-10-CM | POA: Diagnosis not present

## 2019-07-24 DIAGNOSIS — Z7984 Long term (current) use of oral hypoglycemic drugs: Secondary | ICD-10-CM | POA: Diagnosis not present

## 2019-07-24 DIAGNOSIS — E11319 Type 2 diabetes mellitus with unspecified diabetic retinopathy without macular edema: Secondary | ICD-10-CM | POA: Diagnosis not present

## 2019-07-24 DIAGNOSIS — H43813 Vitreous degeneration, bilateral: Secondary | ICD-10-CM | POA: Diagnosis not present

## 2019-07-24 DIAGNOSIS — E119 Type 2 diabetes mellitus without complications: Secondary | ICD-10-CM | POA: Diagnosis not present

## 2019-08-04 ENCOUNTER — Other Ambulatory Visit: Payer: Self-pay | Admitting: Internal Medicine

## 2019-08-07 ENCOUNTER — Other Ambulatory Visit: Payer: Self-pay

## 2019-08-08 ENCOUNTER — Encounter: Payer: Self-pay | Admitting: Internal Medicine

## 2019-08-08 ENCOUNTER — Ambulatory Visit (INDEPENDENT_AMBULATORY_CARE_PROVIDER_SITE_OTHER): Payer: Medicare Other | Admitting: Internal Medicine

## 2019-08-08 VITALS — BP 115/66 | HR 101 | Temp 95.9°F | Resp 18 | Ht 65.0 in | Wt 204.5 lb

## 2019-08-08 DIAGNOSIS — E785 Hyperlipidemia, unspecified: Secondary | ICD-10-CM | POA: Diagnosis not present

## 2019-08-08 DIAGNOSIS — E1169 Type 2 diabetes mellitus with other specified complication: Secondary | ICD-10-CM | POA: Diagnosis not present

## 2019-08-08 DIAGNOSIS — R7989 Other specified abnormal findings of blood chemistry: Secondary | ICD-10-CM | POA: Diagnosis not present

## 2019-08-08 DIAGNOSIS — Z8673 Personal history of transient ischemic attack (TIA), and cerebral infarction without residual deficits: Secondary | ICD-10-CM

## 2019-08-08 DIAGNOSIS — I10 Essential (primary) hypertension: Secondary | ICD-10-CM

## 2019-08-08 MED ORDER — JARDIANCE 10 MG PO TABS: 10.0000 mg | ORAL_TABLET | Freq: Every day | ORAL | 2 refills | Status: DC

## 2019-08-08 NOTE — Progress Notes (Signed)
Pre visit review using our clinic review tool, if applicable. No additional management support is needed unless otherwise documented below in the visit note. 

## 2019-08-08 NOTE — Progress Notes (Signed)
Subjective:    Patient ID: Kristy [name], female    DOB: 1945/12/16, 74 y.o.   MRN: [id number]  DOS:  08/08/2019 Type of visit - description: Routine visit Multiple issues discussed including diabetes, hypertension, history of TIA, cost of medicines. In general feels well.    Review of Systems Denies chest pain, difficulty breathing.  No palpitations or lower extremity edema No lower extremity paresthesias  Past Medical History:  Diagnosis Date  . Allergy    Environmental, foods: shellfish, lactose intolerant, citrus, peanuts  . Asthma    mild persistent  . Atrophic vaginitis   . BRCA gene mutation negative 2015   testing done at [facility]  . DJD (degenerative joint disease)    generalized joint pain, including back  . Family history of malignant neoplasm of breast   . Fibromyalgia    Dr Amil Amen  . GERD (gastroesophageal reflux disease)   . Gout    Idiopathic, chronic  . Hiatal hernia   . History of kidney stones   . Hypertension   . Palpitations    PSVT on Holter monitoring  . Shortness of breath    occ  . Stroke ([place])   . SUI (stress urinary incontinence, female)    (resolved after surgery)  . TIA (transient ischemic attack)    07/2012, see Dr Leonie Man  . Vertigo     Past Surgical History:  Procedure Laterality Date  . ABDOMINAL HYSTERECTOMY  1994   TAH,BSO-BLADDER NECK SUSPENSION  . BREAST SURGERY     LUMPECTOMY-BENIGN Breast Bx X2  . CARPAL TUNNEL RELEASE Right 2005  . CATARACT EXTRACTION, BILATERAL Bilateral 2016   Cataract   . CHOLECYSTECTOMY, LAPAROSCOPIC  2001  . CYSTOCELE REPAIR  1994  . KNEE ARTHROPLASTY  05/19/2012   Procedure: COMPUTER ASSISTED TOTAL KNEE ARTHROPLASTY;  Surgeon: Marybelle Killings, MD;  Location: [place];  Service: Orthopedics;  Laterality: Left;  Left  Total Knee Arthroplasty  . KNEE ARTHROPLASTY Right 09/03/2013   Procedure: COMPUTER ASSISTED TOTAL KNEE ARTHROPLASTY;  Surgeon: Marybelle Killings, MD;  Location: [place];  Service: Orthopedics;   Laterality: Right;  Right Total Knee Arthroplasty, Computer Assist  . KNEE ARTHROSCOPY     bil  . [place] TIBIAL TENDON  2003  . TONSILLECTOMY    . TRIPLE FUSION LEFT ANKLE Left 2007  . TUBAL LIGATION      Allergies as of 08/08/2019      Reactions   Iodine Anaphylaxis   Shellfish Allergy Anaphylaxis   Sulfonamide Derivatives Rash   Citrus Hives, Itching, Other (See Comments)   Itching in mouth/ blisters on lips   Lactose Intolerance (gi) Diarrhea, Swelling   Swollen mouth   Amoxicillin Diarrhea   Did it involve swelling of the face/tongue/throat, SOB, or low BP? No Did it involve sudden or severe rash/hives, skin peeling, or any reaction on the inside of your mouth or nose? No Did you need to seek medical attention at a hospital or doctor's office? No When did it last happen?26-76 years old If all above answers are "NO", may proceed with cephalosporin use.      Medication List       Accurate as of August 08, 2019 11:59 PM. If you have any questions, ask your nurse or doctor.        STOP taking these medications   estradiol 0.1 MG/GM vaginal cream Commonly known as: ESTRACE Stopped by: Kathlene November, MD  TAKE these medications   aspirin EC 81 MG tablet Take 81 mg by mouth daily. What changed: Another medication with the same name was removed. Continue taking this medication, and follow the directions you see here. Changed by: Kathlene November, MD   atorvastatin 10 MG tablet Commonly known as: LIPITOR Take 1 tablet (10 mg total) by mouth at bedtime.   Breo Ellipta 100-25 MCG/INH Aepb Generic drug: fluticasone furoate-vilanterol Inhale 1 puff into the lungs every morning.   CALCIUM 600 PO Take 600 mg by mouth See admin instructions. Take one tablet by mouth every evening (4pm) and one tablet at bedtime   diltiazem 240 MG 24 hr capsule Commonly known as: CARDIZEM CD Take 1 capsule (240 mg total) by mouth daily.   EpiPen 0.3  mg/0.3 mL Devi Generic drug: EPINEPHrine Inject 0.3 mg into the muscle See admin instructions. Inject 0.3 mg intramuscularly in the leg as needed for severe allergic reaction, call 911 and then inject again in the other leg. Reported on 06/26/2015   febuxostat 40 MG tablet Commonly known as: ULORIC Take 40 mg by mouth every evening.   fluticasone 50 MCG/ACT nasal spray Commonly known as: FLONASE Place 2 sprays into both nostrils every morning.   furosemide 40 MG tablet Commonly known as: LASIX Take 0.5 tablets (20 mg total) by mouth daily.   HM Vitamin D3 50 MCG (2000 UT) Caps Generic drug: Cholecalciferol Take 2,000 Units by mouth at bedtime.   Jardiance 10 MG Tabs tablet Generic drug: empagliflozin Take 10 mg by mouth daily before breakfast.   levalbuterol 45 MCG/ACT inhaler Commonly known as: XOPENEX HFA Inhale 2 puffs into the lungs every 4 (four) hours as needed for shortness of breath.   levocetirizine 5 MG tablet Commonly known as: XYZAL Take 5 mg by mouth every evening.   losartan 25 MG tablet Commonly known as: COZAAR Take 1 tablet (25 mg total) by mouth daily.   metFORMIN 850 MG tablet Commonly known as: GLUCOPHAGE Take 1.5 tablets (1,275 mg total) by mouth 2 (two) times daily with a meal.   metoprolol succinate 50 MG 24 hr tablet Commonly known as: TOPROL-XL TAKE 1 TABLET BY MOUTH DAILY. TAKE WITH OR IMMEDIATELY FOLLOWING A MEAL   nortriptyline 25 MG capsule Commonly known as: PAMELOR Take 25 mg by mouth every evening. 4pm   omeprazole 40 MG capsule Commonly known as: PRILOSEC Take 1 capsule (40 mg total) by mouth daily.   Potassium 99 MG Tabs Take 99 mg by mouth at bedtime.   SM Coral Calcium 1000 (390 Ca) MG Tabs Generic drug: Coral Calcium Take by mouth.   vitamin E 180 MG (400 UNITS) capsule Take 400 Units by mouth every evening.          Objective:   Physical Exam BP 115/66 (BP Location: Left Arm, Patient Position: Sitting, Cuff Size:  Normal)   Pulse (!) 101   Temp (!) 95.9 F (35.5 C) (Temporal)   Resp 18   Ht '5\' 5"'  (1.651 m)   Wt 204 lb 8 oz (92.8 kg)   SpO2 99%   BMI 34.03 kg/m  General:   Well developed, NAD, BMI noted.  HEENT:  Normocephalic . Face symmetric, atraumatic Lungs:  CTA B Normal respiratory effort, no intercostal retractions, no accessory muscle use. Heart: RRR,  no murmur.  Abdomen:  Not distended, soft, non-tender. No rebound or rigidity.   Skin: Not pale. Not jaundice Diabetic foot exam: Normal pulses, normal skin, pinprick examination normal Neurologic:  alert & oriented X3.  Speech normal, gait appropriate for age and unassisted Psych--  Cognition and judgment appear intact.  Cooperative with normal attention span and concentration.  Behavior appropriate. No anxious or depressed appearing.     Assessment      Assessment DM w/ h/o TIA HTN  Hyperlipidemia  Insomnia  Asthma , allergies-- sees Dr Harold Hedge  GERD, hiatal hernia , h/o IBS Obesity  CV: ---Palpitations: PSVT by Holter, Dr Gwenlyn Found ---TIA, dx 2014,last visit w/  Dr. Leonie Man 09-2015, RTC prn ENT: Dr Arnette Norris -Vertigo (improved with vestibular rehabilitation 2015) -Asymmetric right ear sensorineural hearing loss with previously normal MRI.   -Menier's (Dx   2020) Increased LFTs --- CT abdomen 2014: NormalLiver, negative hepatitis serologies at rheumatology per patient MSK: Gout, DJD, FM-Fibromyalgia (Dr. Amil Amen), on Pamelor  --Osteopenia  FH breast cancer , sister is BRCA2 (+), pt is  (-) GU: Kidney stones, h/o; Urinary incontinence; Atrophic vaginitis Hyperparathyroidism h/o slt increased PTH 2015 , normal PTH 05-2014  HOH- aids   PLAN: DM: Good compliance with Metformin, off Jardiance for a month due to cost, request a printed prescription.  Will do.  Come back in 1 month for A1c HTN: Ambulatory BPs excellent between 113/60, 127/75.  Continue Cardizem, Lasix, losartan, metoprolol.  Check CMP Hyperlipidemia:  Continue Lipitor, checking FLP. Increased TSH: Check TFTs TIA: Chart is reviewed, prior to TIA was not taking any antiplatelet, since then started aspirin 325.  Issue discussed with neurology via message, okay to drop aspirin to 81 mg daily, will let the patient know RTC for blood work 1 month CPX 8-20 21   This visit occurred during the SARS-CoV-2 public health emergency.  Safety protocols were in place, including screening questions prior to the visit, additional usage of staff PPE, and extensive cleaning of exam room while observing appropriate contact time as indicated for disinfecting solutions.

## 2019-08-08 NOTE — Patient Instructions (Addendum)
Per our records you are due for an eye exam. Please contact your eye doctor to schedule an appointment. Please have them send copies of your office visit notes to [place]. Our fax number is [phone].  GO TO THE FRONT DESK Come back for blood work only in 1 month, fasting , please make an appointment  Come back for a physical exam by 12-2019

## 2019-08-09 ENCOUNTER — Encounter: Payer: Self-pay | Admitting: Internal Medicine

## 2019-08-09 NOTE — Assessment & Plan Note (Signed)
DM: Good compliance with Metformin, off Jardiance for a month due to cost, request a printed prescription.  Will do.  Come back in 1 month for A1c HTN: Ambulatory BPs excellent between 113/60, 127/75.  Continue Cardizem, Lasix, losartan, metoprolol.  Check CMP Hyperlipidemia: Continue Lipitor, checking FLP. Increased TSH: Check TFTs TIA: Chart is reviewed, prior to TIA was not taking any antiplatelet, since then started aspirin 325.  Issue discussed with neurology via message, okay to drop aspirin to 81 mg daily, will let the patient know RTC for blood work 1 month CPX 8-20 21

## 2019-08-10 DIAGNOSIS — Z23 Encounter for immunization: Secondary | ICD-10-CM | POA: Diagnosis not present

## 2019-09-10 ENCOUNTER — Other Ambulatory Visit (INDEPENDENT_AMBULATORY_CARE_PROVIDER_SITE_OTHER): Payer: Medicare Other

## 2019-09-10 ENCOUNTER — Other Ambulatory Visit: Payer: Self-pay

## 2019-09-10 DIAGNOSIS — I10 Essential (primary) hypertension: Secondary | ICD-10-CM

## 2019-09-10 DIAGNOSIS — E785 Hyperlipidemia, unspecified: Secondary | ICD-10-CM | POA: Diagnosis not present

## 2019-09-10 DIAGNOSIS — E1169 Type 2 diabetes mellitus with other specified complication: Secondary | ICD-10-CM | POA: Diagnosis not present

## 2019-09-10 DIAGNOSIS — R7989 Other specified abnormal findings of blood chemistry: Secondary | ICD-10-CM

## 2019-09-10 LAB — COMPREHENSIVE METABOLIC PANEL
ALT: 27 U/L (ref 0–35)
AST: 17 U/L (ref 0–37)
Albumin: 4.4 g/dL (ref 3.5–5.2)
Alkaline Phosphatase: 64 U/L (ref 39–117)
BUN: 18 mg/dL (ref 6–23)
CO2: 24 mEq/L (ref 19–32)
Calcium: 9.2 mg/dL (ref 8.4–10.5)
Chloride: 104 mEq/L (ref 96–112)
Creatinine, Ser: 0.82 mg/dL (ref 0.40–1.20)
GFR: 68 mL/min (ref >60.00)
Glucose, Bld: 132 mg/dL — ABNORMAL HIGH (ref 70–99)
Potassium: 4 mEq/L (ref 3.5–5.1)
Sodium: 140 mEq/L (ref 135–145)
Total Bilirubin: 0.9 mg/dL (ref 0.2–1.2)
Total Protein: 6.8 g/dL (ref 6.0–8.3)

## 2019-09-10 LAB — LIPID PANEL
Cholesterol: 103 mg/dL (ref 0–200)
HDL: 30.6 mg/dL — ABNORMAL LOW (ref >39.00)
NonHDL: 72.05
Total CHOL/HDL Ratio: 3
Triglycerides: 204 mg/dL — ABNORMAL HIGH (ref 0.0–149.0)
VLDL: 40.8 mg/dL — ABNORMAL HIGH (ref 0.0–40.0)

## 2019-09-10 LAB — T3, FREE: T3, Free: 4.4 pg/mL — ABNORMAL HIGH (ref 2.3–4.2)

## 2019-09-10 LAB — HEMOGLOBIN A1C: Hgb A1c MFr Bld: 7.3 % — ABNORMAL HIGH (ref 4.6–6.5)

## 2019-09-10 LAB — LDL CHOLESTEROL, DIRECT: Direct LDL: 43 mg/dL

## 2019-09-10 LAB — T4, FREE: Free T4: 0.86 ng/dL (ref 0.60–1.60)

## 2019-09-10 LAB — TSH: TSH: 2.94 u[IU]/mL (ref 0.35–4.50)

## 2019-09-13 MED ORDER — JARDIANCE 25 MG PO TABS: 25.0000 mg | ORAL_TABLET | Freq: Every day | ORAL | 1 refills | Status: DC

## 2019-09-13 NOTE — Addendum Note (Signed)
Addended byDamita Dunnings D on: 09/13/2019 04:01 PM   Modules accepted: Orders

## 2019-09-21 NOTE — Progress Notes (Signed)
Nurse connected with patient 09/24/19 at  2:00 PM EDT by a telephone enabled telemedicine application and verified that I am speaking with the correct person using two identifiers. Patient stated full name and DOB. Patient gave permission to continue with virtual visit. Patient's location was at home and Nurse's location was at [place] office.    Subjective:   Kristy BLIZARD is a 74 y.o. female who presents for Medicare Annual (Subsequent) preventive examination.  Enjoys oil painting. Recently found out husband has brain tumer- he is scheduled for surgery in 2 days. Son is helping out a lot.   Review of Systems:  Cardiac Risk Factors include: advanced age (>39mn, >>14women);diabetes mellitus;obesity (BMI >30kg/m2) Home Safety/Smoke Alarms: Feels safe in home. Smoke alarms in place.  Lives w/ husband and dog (lg golden doodle) in 1 story home. Ramp entry. Uses cane.   Female:       Mammo-    01/02/19   Dexa scan-  01/02/19      CCS-11/28/15. Recall 5 yrs.  Eye- SMarica Otter2/23/21    Objective:     Vitals: BP 136/84 (BP Location: Left Arm, Patient Position: Sitting, Cuff Size: Normal)   Pulse 84   Temp (!) 97 F (36.1 C) (Temporal)   Ht '5\' 5"'$  (1.651 m)   Wt 204 lb 6.4 oz (92.7 kg)   SpO2 97%   BMI 34.01 kg/m   Body mass index is 34.01 kg/m.  Advanced Directives 09/24/2019 02/23/2019 09/22/2018 11/13/2015 08/24/2013 07/15/2012 05/10/2012  Does Patient Have a Medical Advance Directive? Yes Yes Yes Yes Patient has advance directive, copy not in chart Patient has advance directive, copy not in chart Patient has advance directive, copy in chart  Type of Advance Directive HCass[place]Living will Living will [place]Living will [place]Living will Living will;Healthcare Power of [place]akelandLiving will Living will;Healthcare Power of Attorney  Does patient want to make changes to medical advance directive? No -  Patient declined No - Patient declined No - Patient declined No - Patient declined No change requested - -  Copy of [place] Chart? No - copy requested - No - copy requested No - copy requested Copy requested from family Copy requested from family -  Pre-existing out of facility DNR order (yellow form or pink MOST form) - - - - - No -    Tobacco Social History   Tobacco Use  Smoking Status Never Smoker  Smokeless Tobacco Never Used     Counseling given: Not Answered   Clinical Intake: Pain : No/denies pain     Past Medical History:  Diagnosis Date  . Allergy    Environmental, foods: shellfish, lactose intolerant, citrus, peanuts  . Asthma    mild persistent  . Atrophic vaginitis   . BRCA gene mutation negative 2015   [facility]  . DJD (degenerative joint disease)    generalized joint pain, including back  . Family history of malignant neoplasm of breast   . Fibromyalgia    Dr BAmil Amen . GERD (gastroesophageal reflux disease)   . Gout    Idiopathic, chronic  . Hiatal hernia   . History of kidney stones   . Hypertension   . Palpitations    PSVT on Holter monitoring  . Shortness of breath    occ  . [place]   . SUI (stress urinary incontinence, female)    (resolved after  surgery)  . TIA (transient ischemic attack)    07/2012, see Dr Leonie Man  . Vertigo    Past Surgical History:  Procedure Laterality Date  . ABDOMINAL HYSTERECTOMY  1994   TAH,BSO-BLADDER NECK SUSPENSION  . BREAST SURGERY     LUMPECTOMY-BENIGN Breast Bx X2  . CARPAL TUNNEL RELEASE Right 2005  . CATARACT EXTRACTION, BILATERAL Bilateral 2016   Cataract   . CHOLECYSTECTOMY, LAPAROSCOPIC  2001  . CYSTOCELE REPAIR  1994  . KNEE ARTHROPLASTY  05/19/2012   Procedure: COMPUTER ASSISTED TOTAL KNEE ARTHROPLASTY;  Surgeon: Marybelle Killings, MD;  Location: [place];  Service: Orthopedics;  Laterality: Left;  Left  Total Knee Arthroplasty  . KNEE ARTHROPLASTY Right  09/03/2013   Procedure: COMPUTER ASSISTED TOTAL KNEE ARTHROPLASTY;  Surgeon: Marybelle Killings, MD;  Location: [place];  Service: Orthopedics;  Laterality: Right;  Right Total Knee Arthroplasty, Computer Assist  . KNEE ARTHROSCOPY     bil  . [place] TIBIAL TENDON  2003  . TONSILLECTOMY    . TRIPLE FUSION LEFT ANKLE Left 2007  . TUBAL LIGATION     Family History  Problem Relation Age of Onset  . Breast cancer Mother 50       deceased 1  . Breast cancer Sister 70       bilateral breast ca @ 5; BRCA2 positive  . Hypertension Sister   . Cancer Father        Brain tumor; path?; deceased 56  . Lymphoma Maternal Aunt        deceased 87s  . Hemochromatosis Sister   . Thalassemia Sister   . Other Sister        negative for BRCA1 BRCA2  . Breast cancer Cousin        BRCA2 positive  . Diabetes Other   . Breast cancer Other        distant maternal female relatives with breast cancer  . CAD Son        MI  . Heart failure Sister        due to chemo?  . Colon cancer Neg Hx    Social History   Socioeconomic History  . Marital status: Married    Spouse name: [name]  . Number of children: 2  . Years of education: 83  . Highest education level: Not on file  Occupational History  . 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 Occupation: works part time, taxes   Tobacco Use  . Smoking status: Never Smoker  . Smokeless tobacco: Never Used  Substance and Sexual Activity  . Alcohol use: No    Alcohol/week: 0.0 standard drinks  . Drug use: No  . Sexual activity: Not Currently    Birth control/protection: Surgical  Other Topics Concern  . 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 married '69. 2 sons - '70, '72: retired - [employer]. SO- good health.   ACP - OK for CPR at least once, no long term ventilation, no heroic measures in the face of poor quality of life. HCPOA - husband, secondary son Laraine Samet (c)  [phone]       Social Determinants of Health   Financial Resource Strain: Low Risk   . Difficulty of Paying Living Expenses: Not hard at all  Food Insecurity: No Food Insecurity  . Worried About Charity fundraiser in the Last  Year: Never true  . Ran Out of Food in the Last Year: Never true  Transportation Needs: No Transportation Needs  . Lack of Transportation (Medical): No  . Lack of Transportation (Non-Medical): No  Physical Activity:   . Days of Exercise per Week:   . Minutes of Exercise per Session:   Stress:   . Feeling of Stress :   Social Connections:   . Frequency of Communication with Friends and Family:   . Frequency of Social Gatherings with Friends and Family:   . Attends Religious Services:   . Active Member of Clubs or Organizations:   . Attends Archivist Meetings:   Marland Kitchen Marital Status:     Outpatient Encounter Medications as of 09/24/2019  Medication Sig  . aspirin EC 81 MG tablet Take 81 mg by mouth daily.  Marland Kitchen atorvastatin (LIPITOR) 10 MG tablet Take 1 tablet (10 mg total) by mouth at bedtime.  . Calcium Carbonate (CALCIUM 600 PO) Take 600 mg by mouth See admin instructions. Take one tablet by mouth every evening (4pm) and one tablet at bedtime  . Cholecalciferol (HM VITAMIN D3) 2000 UNITS CAPS Take 2,000 Units by mouth at bedtime.   Marland Kitchen diltiazem (CARDIZEM CD) 240 MG 24 hr capsule Take 1 capsule (240 mg total) by mouth daily.  . empagliflozin (JARDIANCE) 25 MG TABS tablet Take 25 mg by mouth daily before breakfast.  . febuxostat (ULORIC) 40 MG tablet Take 40 mg by mouth every evening.   . fluticasone (FLONASE) 50 MCG/ACT nasal spray Place 2 sprays into both nostrils every morning.   . furosemide (LASIX) 40 MG tablet Take 0.5 tablets (20 mg total) by mouth daily.  Marland Kitchen levocetirizine (XYZAL) 5 MG tablet Take 5 mg by mouth every evening.   Marland Kitchen losartan (COZAAR) 25 MG tablet Take 1 tablet (25 mg total) by mouth daily.  . metoprolol succinate (TOPROL-XL) 50  MG 24 hr tablet TAKE 1 TABLET BY MOUTH DAILY. TAKE WITH OR IMMEDIATELY FOLLOWING A MEAL  . nortriptyline (PAMELOR) 25 MG capsule Take 25 mg by mouth every evening. 4pm  . omeprazole (PRILOSEC) 40 MG capsule Take 1 capsule (40 mg total) by mouth daily.  . Potassium 99 MG TABS Take 99 mg by mouth at bedtime.   . vitamin E 400 UNIT capsule Take 400 Units by mouth every evening.   Marland Kitchen EPINEPHrine (EPIPEN) 0.3 mg/0.3 mL DEVI Inject 0.3 mg into the muscle See admin instructions. Inject 0.3 mg intramuscularly in the leg as needed for severe allergic reaction, call 911 and then inject again in the other leg. Reported on 06/26/2015  . levalbuterol (XOPENEX HFA) 45 MCG/ACT inhaler Inhale 2 puffs into the lungs every 4 (four) hours as needed for shortness of breath.   . metFORMIN (GLUCOPHAGE) 850 MG tablet Take 1.5 tablets (1,275 mg total) by mouth 2 (two) times daily with a meal.  . [DISCONTINUED] Coral Calcium (SM CORAL CALCIUM) 1000 (390 Ca) MG TABS Take by mouth.  . [DISCONTINUED] fluticasone furoate-vilanterol (BREO ELLIPTA) 100-25 MCG/INH AEPB Inhale 1 puff into the lungs every morning.   No facility-administered encounter medications on file as of 09/24/2019.    Activities of Daily Living In your present state of health, do you have any difficulty performing the following activities: 09/24/2019  Hearing? Y  Comment wears bilateral hearing aids.  Vision? Y  Difficulty concentrating or making decisions? N  Walking or climbing stairs? Y  Dressing or bathing? N  Doing errands, shopping? N  Conservation officer, nature and  eating ? N  Using the Toilet? N  In the past six months, have you accidently leaked urine? N  Do you have problems with loss of bowel control? N  Managing your Medications? N  Managing your Finances? N  Housekeeping or managing your Housekeeping? N  Some recent data might be hidden    Patient Care Team: Colon Branch, MD as PCP - General (Internal Medicine) Hennie Duos, MD  (Rheumatology) Lorretta Harp, MD as Consulting Physician (Cardiology) Steele Berg, Counselor (Inactive) (Genetic Counselor) Garvin Fila, MD as Consulting Physician (Neurology) Harold Hedge, Darrick Grinder, MD as Consulting Physician (Allergy and Immunology) Leta Baptist, MD as Consulting Physician (Otolaryngology)    Assessment:   This is a routine wellness examination for Selisa. Physical assessment deferred to PCP.  Exercise Activities and Dietary recommendations Current Exercise Habits: The patient does not participate in regular exercise at present, Exercise limited by: None identified   Diet (meal preparation, eat out, water intake, caffeinated beverages, dairy products, fruits and vegetables): in general, a "healthy" diet  , well balanced    Goals    . Increase physical activity       Fall Risk Fall Risk  09/24/2019 09/22/2018 07/10/2018 07/04/2017 05/05/2017  Falls in the past year? 1 0 1 No Yes  Comment - - - - Emmi Telephone Survey: data to providers prior to load  Number falls in past yr: 1 - 1 - 2 or more  Comment - - - - Emmi Telephone Survey Actual Response = 3  Injury with Fall? 0 - 0 - No  Risk for fall due to : Impaired balance/gait - - - -  Follow up Education provided;Falls prevention discussed - - - -   Depression Screen PHQ 2/9 Scores 09/24/2019 09/22/2018 07/10/2018 07/04/2017  PHQ - 2 Score 0 0 0 0  PHQ- 9 Score - - 6 -     Cognitive Function Ad8 score reviewed for issues:  Issues making decisions:no  Less interest in hobbies / activities:no  Repeats questions, stories (family complaining):no  Trouble using ordinary gadgets (microwave, computer, phone):no  Forgets the month or year: no  Mismanaging finances: no  Remembering appts:no  Daily problems with thinking and/or memory:no Ad8 score is=0        Immunization History  Administered Date(s) Administered  . Fluad Quad(high Dose 65+) 02/16/2019  . Influenza Whole 02/28/2009, 02/20/2010,  04/26/2012  . Influenza, High Dose Seasonal PF 02/11/2015, 03/30/2016, 03/28/2017, 03/09/2018  . Influenza,inj,Quad PF,6+ Mos 02/19/2014  . Influenza-Unspecified 04/20/2013  . Pneumococcal Conjugate-13 06/05/2013  . Pneumococcal Polysaccharide-23 02/20/2010  . Tdap 05/05/2011     Screening Tests Health Maintenance  Topic Date Due  . COVID-19 Vaccine (1) Never done  . INFLUENZA VACCINE  12/30/2019  . MAMMOGRAM  01/02/2020  . HEMOGLOBIN A1C  03/11/2020  . OPHTHALMOLOGY EXAM  07/23/2020  . FOOT EXAM  08/07/2020  . COLONOSCOPY  11/27/2020  . TETANUS/TDAP  05/04/2021  . DEXA SCAN  Completed  . Hepatitis C Screening  Completed  . PNA vac Low Risk Adult  Completed      Plan:    Please schedule your next medicare wellness visit with me in 1 yr.  Continue to eat heart healthy diet (full of fruits, vegetables, whole grains, lean protein, water--limit salt, fat, and sugar intake) and increase physical activity as tolerated.  Continue doing brain stimulating activities (puzzles, reading, adult coloring books, staying active) to keep memory sharp.   Bring a copy of your  living will and/or healthcare power of attorney to your next office visit.    I have personally reviewed and noted the following in the patient's chart:   . Medical and social history . Use of alcohol, tobacco or illicit drugs  . Current medications and supplements . Functional ability and status . Nutritional status . Physical activity . Advanced directives . List of other physicians . Hospitalizations, surgeries, and ER visits in previous 12 months . Vitals . Screenings to include cognitive, depression, and falls . Referrals and appointments  In addition, I have reviewed and discussed with patient certain preventive protocols, quality metrics, and best practice recommendations. A written personalized care plan for preventive services as well as general preventive health recommendations were provided to  patient.     Naaman Plummer [place], [place]  09/24/2019

## 2019-09-24 ENCOUNTER — Encounter: Payer: Self-pay | Admitting: *Deleted

## 2019-09-24 ENCOUNTER — Ambulatory Visit (INDEPENDENT_AMBULATORY_CARE_PROVIDER_SITE_OTHER): Payer: Medicare Other | Admitting: *Deleted

## 2019-09-24 ENCOUNTER — Other Ambulatory Visit: Payer: Self-pay

## 2019-09-24 VITALS — BP 136/84 | HR 84 | Temp 97.0°F | Ht 65.0 in | Wt 204.4 lb

## 2019-09-24 DIAGNOSIS — Z0000 Encounter for general adult medical examination without abnormal findings: Secondary | ICD-10-CM | POA: Diagnosis not present

## 2019-09-24 NOTE — Patient Instructions (Signed)
Please schedule your next medicare wellness visit with me in 1 yr.  Continue to eat heart healthy diet (full of fruits, vegetables, whole grains, lean protein, water--limit salt, fat, and sugar intake) and increase physical activity as tolerated.  Continue doing brain stimulating activities (puzzles, reading, adult coloring books, staying active) to keep memory sharp.   Bring a copy of your living will and/or healthcare power of attorney to your next office visit.   Kristy [name] , Thank you for taking time to come for your Medicare Wellness Visit. I appreciate your ongoing commitment to your health goals. Please review the following plan we discussed and let me know if I can assist you in the future.   These are the goals we discussed: Goals    . Increase physical activity       This is a list of the screening recommended for you and due dates:  Health Maintenance  Topic Date Due  . COVID-19 Vaccine (1) Never done  . Flu Shot  12/30/2019  . Mammogram  01/02/2020  . Hemoglobin A1C  03/11/2020  . Eye exam for diabetics  07/23/2020  . Complete foot exam   08/07/2020  . Colon Cancer Screening  11/27/2020  . Tetanus Vaccine  05/04/2021  . DEXA scan (bone density measurement)  Completed  .  Hepatitis C: One time screening is recommended by Center for Disease Control  (CDC) for  adults born from [birth year] through 1966.   Completed  . Pneumonia vaccines  Completed    Preventive Care 32 Years and Older, Female Preventive care refers to lifestyle choices and visits with your health care provider that can promote health and wellness. This includes:  A yearly physical exam. This is also called an annual well check.  Regular dental and eye exams.  Immunizations.  Screening for certain conditions.  Healthy lifestyle choices, such as diet and exercise. What can I expect for my preventive care visit? Physical exam Your health care provider will check:  Height and weight. These may be used  to calculate body mass index (BMI), which is a measurement that tells if you are at a healthy weight.  Heart rate and blood pressure.  Your skin for abnormal spots. Counseling Your health care provider may ask you questions about:  Alcohol, tobacco, and drug use.  Emotional well-being.  Home and relationship well-being.  Sexual activity.  Eating habits.  History of falls.  Memory and ability to understand (cognition).  Work and work Statistician.  Pregnancy and menstrual history. What immunizations do I need?  Influenza (flu) vaccine  This is recommended every year. Tetanus, diphtheria, and pertussis (Tdap) vaccine  You may need a Td booster every 10 years. Varicella (chickenpox) vaccine  You may need this vaccine if you have not already been vaccinated. Zoster (shingles) vaccine  You may need this after age 102. Pneumococcal conjugate (PCV13) vaccine  One dose is recommended after age 52. Pneumococcal polysaccharide (PPSV23) vaccine  One dose is recommended after age 79. Measles, mumps, and rubella (MMR) vaccine  You may need at least one dose of MMR if you were born in 1957 or later. You may also need a second dose. Meningococcal conjugate (MenACWY) vaccine  You may need this if you have certain conditions. Hepatitis A vaccine  You may need this if you have certain conditions or if you travel or work in places where you may be exposed to hepatitis A. Hepatitis B vaccine  You may need this if you have certain  conditions or if you travel or work in places where you may be exposed to hepatitis B. Haemophilus influenzae type b (Hib) vaccine  You may need this if you have certain conditions. You may receive vaccines as individual doses or as more than one vaccine together in one shot (combination vaccines). Talk with your health care provider about the risks and benefits of combination vaccines. What tests do I need? Blood tests  Lipid and cholesterol  levels. These may be checked every 5 years, or more frequently depending on your overall health.  Hepatitis C test.  Hepatitis B test. Screening  Lung cancer screening. You may have this screening every year starting at age 49 if you have a 30-pack-year history of smoking and currently smoke or have quit within the past 15 years.  Colorectal cancer screening. All adults should have this screening starting at age 32 and continuing until age 57. Your health care provider may recommend screening at age 41 if you are at increased risk. You will have tests every 1-10 years, depending on your results and the type of screening test.  Diabetes screening. This is done by checking your blood sugar (glucose) after you have not eaten for a while (fasting). You may have this done every 1-3 years.  Mammogram. This may be done every 1-2 years. Talk with your health care provider about how often you should have regular mammograms.  BRCA-related cancer screening. This may be done if you have a family history of breast, ovarian, tubal, or peritoneal cancers. Other tests  Sexually transmitted disease (STD) testing.  Bone density scan. This is done to screen for osteoporosis. You may have this done starting at age 28. Follow these instructions at home: Eating and drinking  Eat a diet that includes fresh fruits and vegetables, whole grains, lean protein, and low-fat dairy products. Limit your intake of foods with high amounts of sugar, saturated fats, and salt.  Take vitamin and mineral supplements as recommended by your health care provider.  Do not drink alcohol if your health care provider tells you not to drink.  If you drink alcohol: ? Limit how much you have to 0-1 drink a day. ? Be aware of how much alcohol is in your drink. In the [place] one drink equals one 12 oz bottle of beer (355 mL), one 5 oz glass of wine (148 mL), or one 1 oz glass of hard liquor (44 mL). Lifestyle  Take daily care of  your teeth and gums.  Stay active. Exercise for at least 30 minutes on 5 or more days each week.  Do not use any products that contain nicotine or tobacco, such as cigarettes, e-cigarettes, and chewing tobacco. If you need help quitting, ask your health care provider.  If you are sexually active, practice safe sex. Use a condom or other form of protection in order to prevent STIs (sexually transmitted infections).  Talk with your health care provider about taking a low-dose aspirin or statin. What's next?  Go to your health care provider once a year for a well check visit.  Ask your health care provider how often you should have your eyes and teeth checked.  Stay up to date on all vaccines. This information is not intended to replace advice given to you by your health care provider. Make sure you discuss any questions you have with your health care provider. Document Revised: 05/11/2018 Document Reviewed: 05/11/2018 Elsevier Patient Education  2020 [employer].

## 2019-10-22 ENCOUNTER — Encounter: Payer: Self-pay | Admitting: Internal Medicine

## 2019-10-22 DIAGNOSIS — M1A09X0 Idiopathic chronic gout, multiple sites, without tophus (tophi): Secondary | ICD-10-CM | POA: Diagnosis not present

## 2019-10-22 DIAGNOSIS — E669 Obesity, unspecified: Secondary | ICD-10-CM | POA: Diagnosis not present

## 2019-10-22 DIAGNOSIS — Z6834 Body mass index (BMI) 34.0-34.9, adult: Secondary | ICD-10-CM | POA: Diagnosis not present

## 2019-10-22 DIAGNOSIS — M150 Primary generalized (osteo)arthritis: Secondary | ICD-10-CM | POA: Diagnosis not present

## 2019-10-22 DIAGNOSIS — M65321 Trigger finger, right index finger: Secondary | ICD-10-CM | POA: Diagnosis not present

## 2019-10-22 DIAGNOSIS — R945 Abnormal results of liver function studies: Secondary | ICD-10-CM | POA: Diagnosis not present

## 2019-10-22 DIAGNOSIS — M5136 Other intervertebral disc degeneration, lumbar region: Secondary | ICD-10-CM | POA: Diagnosis not present

## 2019-10-22 DIAGNOSIS — M797 Fibromyalgia: Secondary | ICD-10-CM | POA: Diagnosis not present

## 2019-11-04 ENCOUNTER — Other Ambulatory Visit: Payer: Self-pay | Admitting: Internal Medicine

## 2019-11-11 ENCOUNTER — Other Ambulatory Visit: Payer: Self-pay | Admitting: Internal Medicine

## 2019-12-22 ENCOUNTER — Other Ambulatory Visit: Payer: Self-pay | Admitting: Internal Medicine

## 2020-01-08 ENCOUNTER — Ambulatory Visit (INDEPENDENT_AMBULATORY_CARE_PROVIDER_SITE_OTHER): Payer: Medicare Other | Admitting: Internal Medicine

## 2020-01-08 ENCOUNTER — Other Ambulatory Visit: Payer: Self-pay

## 2020-01-08 ENCOUNTER — Encounter: Payer: Self-pay | Admitting: Internal Medicine

## 2020-01-08 VITALS — BP 151/76 | HR 95 | Temp 98.2°F | Resp 12 | Ht 65.0 in | Wt 197.2 lb

## 2020-01-08 DIAGNOSIS — R7989 Other specified abnormal findings of blood chemistry: Secondary | ICD-10-CM

## 2020-01-08 DIAGNOSIS — E039 Hypothyroidism, unspecified: Secondary | ICD-10-CM | POA: Diagnosis not present

## 2020-01-08 DIAGNOSIS — I10 Essential (primary) hypertension: Secondary | ICD-10-CM

## 2020-01-08 DIAGNOSIS — E038 Other specified hypothyroidism: Secondary | ICD-10-CM

## 2020-01-08 DIAGNOSIS — E1169 Type 2 diabetes mellitus with other specified complication: Secondary | ICD-10-CM | POA: Diagnosis not present

## 2020-01-08 DIAGNOSIS — K589 Irritable bowel syndrome without diarrhea: Secondary | ICD-10-CM

## 2020-01-08 LAB — CBC WITH DIFFERENTIAL/PLATELET
Basophils Absolute: 0.1 10*3/uL (ref 0.0–0.1)
Basophils Relative: 0.8 % (ref 0.0–3.0)
Eosinophils Absolute: 0.1 10*3/uL (ref 0.0–0.7)
Eosinophils Relative: 1.7 % (ref 0.0–5.0)
HCT: 43.6 % (ref 36.0–46.0)
Hemoglobin: 13.9 g/dL (ref 12.0–15.0)
Lymphocytes Relative: 37.9 % (ref 12.0–46.0)
Lymphs Abs: 3.3 10*3/uL (ref 0.7–4.0)
MCHC: 31.9 g/dL (ref 30.0–36.0)
MCV: 76.9 fl — ABNORMAL LOW (ref 78.0–100.0)
Monocytes Absolute: 0.8 10*3/uL (ref 0.1–1.0)
Monocytes Relative: 9.6 % (ref 3.0–12.0)
Neutro Abs: 4.4 10*3/uL (ref 1.4–7.7)
Neutrophils Relative %: 50 % (ref 43.0–77.0)
Platelets: 277 10*3/uL (ref 150.0–400.0)
RBC: 5.67 Mil/uL — ABNORMAL HIGH (ref 3.87–5.11)
RDW: 16.7 % — ABNORMAL HIGH (ref 11.5–15.5)
WBC: 8.8 10*3/uL (ref 4.0–10.5)

## 2020-01-08 LAB — HEMOGLOBIN A1C: Hgb A1c MFr Bld: 7.5 % — ABNORMAL HIGH (ref 4.6–6.5)

## 2020-01-08 LAB — TSH: TSH: 3.91 u[IU]/mL (ref 0.35–4.50)

## 2020-01-08 NOTE — Patient Instructions (Signed)
Check the  blood pressure regularly, good hydration with meals, if you continue to have low blood pressures after a meal let me know. BP GOAL is between 110/65 and  135/85. If it is consistently higher or lower, let me know   Get your flu shot vaccination this fall See your gynecologist regularly, you are due for a mammogram   GO TO THE LAB : Get the blood work     Kristy [name], Kristy [name] back for a checkup in 6 months

## 2020-01-08 NOTE — Progress Notes (Signed)
Subjective:    Patient ID: Kristy [name], female    DOB: 1946/02/08, 74 y.o.   MRN: [id number]  DOS:  01/08/2020 Type of visit - description: Routine visit  In general is doing okay. Today we talk about hypertension, diabetes, osteopenia, dizziness.   Review of Systems Still has dizziness  at baseline. Has noted some nausea and diarrhea on and off for 2 weeks, no blood in the stools, no fever chills or weight loss.  Patient think is related to IBS. Denies chest pain, difficulty breathing or palpitations No cough or sputum production  Past Medical History:  Diagnosis Date   Allergy    Environmental, foods: shellfish, lactose intolerant, citrus, peanuts   Asthma    mild persistent   Atrophic vaginitis    BRCA gene mutation negative 2015   testing done at [facility]   DJD (degenerative joint disease)    generalized joint pain, including back   Family history of malignant neoplasm of breast    Fibromyalgia    Dr Amil Amen   GERD (gastroesophageal reflux disease)    Gout    Idiopathic, chronic   Hiatal hernia    History of kidney stones    Hypertension    Palpitations    PSVT on Holter monitoring   Shortness of breath    occ   Stroke [facility])    SUI (stress urinary incontinence, female)    (resolved after surgery)   TIA (transient ischemic attack)    07/2012, see Dr Leonie Man   Vertigo     Past Surgical History:  Procedure Laterality Date   ABDOMINAL HYSTERECTOMY  1994   TAH,BSO-BLADDER NECK SUSPENSION   BREAST SURGERY     LUMPECTOMY-BENIGN Breast Bx X2   CARPAL TUNNEL RELEASE Right 2005   CATARACT EXTRACTION, BILATERAL Bilateral 2016   Cataract    CHOLECYSTECTOMY, LAPAROSCOPIC  2001   [place]   KNEE ARTHROPLASTY  05/19/2012   Procedure: COMPUTER ASSISTED TOTAL KNEE ARTHROPLASTY;  Surgeon: Marybelle Killings, MD;  Location: [facility];  Service: Orthopedics;  Laterality: Left;  Left  Total Knee Arthroplasty   KNEE ARTHROPLASTY Right  09/03/2013   Procedure: COMPUTER ASSISTED TOTAL KNEE ARTHROPLASTY;  Surgeon: Marybelle Killings, MD;  Location: [place];  Service: Orthopedics;  Laterality: Right;  Right Total Knee Arthroplasty, Computer Assist   KNEE ARTHROSCOPY     bil   OOPHORECTOMY     BSO   SURGERY FOR POSERIOR TIBIAL TENDON  2003   TONSILLECTOMY     TRIPLE FUSION LEFT ANKLE Left 2007   TUBAL LIGATION      Allergies as of 01/08/2020      Reactions   Iodine Anaphylaxis   Shellfish Allergy Anaphylaxis   Sulfonamide Derivatives Rash   Citrus Hives, Itching, Other (See Comments)   Itching in mouth/ blisters on lips   Lactose Intolerance (gi) Diarrhea, Swelling   Swollen mouth   Amoxicillin Diarrhea   Did it involve swelling of the face/tongue/throat, SOB, or low BP? No Did it involve sudden or severe rash/hives, skin peeling, or any reaction on the inside of your mouth or nose? No Did you need to seek medical attention at a hospital or doctor's office? No When did it last happen?50-3 years old If all above answers are NO, may proceed with cephalosporin use.      Medication List       Accurate as of January 08, 2020 11:59 PM. If you have any questions, ask your nurse  or doctor.        aspirin EC 81 MG tablet Take 81 mg by mouth daily.   atorvastatin 10 MG tablet Commonly known as: LIPITOR Take 1 tablet (10 mg total) by mouth at bedtime.   CALCIUM 600 PO Take 600 mg by mouth See admin instructions. Take one tablet by mouth every evening (4pm) and one tablet at bedtime   diltiazem 240 MG 24 hr capsule Commonly known as: CARDIZEM CD Take 1 capsule (240 mg total) by mouth daily.   EpiPen 0.3 mg/0.3 mL Devi Generic drug: EPINEPHrine Inject 0.3 mg into the muscle See admin instructions. Inject 0.3 mg intramuscularly in the leg as needed for severe allergic reaction, call 911 and then inject again in the other leg. Reported on 06/26/2015   febuxostat 40 MG tablet Commonly known as: ULORIC Take 40  mg by mouth every evening.   fluticasone 50 MCG/ACT nasal spray Commonly known as: FLONASE Place 2 sprays into both nostrils every morning.   furosemide 40 MG tablet Commonly known as: LASIX Take 0.5 tablets (20 mg total) by mouth daily.   HM Vitamin D3 50 MCG (2000 UT) Caps Generic drug: Cholecalciferol Take 2,000 Units by mouth at bedtime.   Jardiance 10 MG Tabs tablet Generic drug: empagliflozin Take 10 mg by mouth daily. What changed: Another medication with the same name was removed. Continue taking this medication, and follow the directions you see here. Changed by: Kathlene November, MD   levalbuterol 45 MCG/ACT inhaler Commonly known as: XOPENEX HFA Inhale 2 puffs into the lungs every 4 (four) hours as needed for shortness of breath.   levocetirizine 5 MG tablet Commonly known as: XYZAL Take 5 mg by mouth every evening.   losartan 25 MG tablet Commonly known as: COZAAR Take 1 tablet (25 mg total) by mouth daily.   metFORMIN 850 MG tablet Commonly known as: GLUCOPHAGE Take 1.5 tablets (1,275 mg total) by mouth 2 (two) times daily with a meal.   metoprolol succinate 50 MG 24 hr tablet Commonly known as: TOPROL-XL TAKE 1 TABLET BY MOUTH DAILY. TAKE WITH OR IMMEDIATELY FOLLOWING A MEAL   nortriptyline 25 MG capsule Commonly known as: PAMELOR Take 25 mg by mouth every evening. 4pm   omeprazole 40 MG capsule Commonly known as: PRILOSEC Take 1 capsule (40 mg total) by mouth daily.   Potassium 99 MG Tabs Take 99 mg by mouth at bedtime.   vitamin E 180 MG (400 UNITS) capsule Take 400 Units by mouth every evening.          Objective:   Physical Exam BP (!) 151/76 (BP Location: Right Arm, Cuff Size: Large)    Pulse 95    Temp 98.2 F (36.8 C) (Oral)    Resp 12    Ht '5\' 5"'  (1.651 m)    Wt 197 lb 3.2 oz (89.4 kg)    SpO2 95%    BMI 32.82 kg/m  General:   Well developed, NAD, BMI noted.  HEENT:  Normocephalic . Face symmetric, atraumatic Lungs:  CTA B Normal  respiratory effort, no intercostal retractions, no accessory muscle use. Heart: RRR,  no murmur.  Abdomen:  Not distended, soft, non-tender. No rebound or rigidity.   Skin: Not pale. Not jaundice Lower extremities: no pretibial edema bilaterally  Neurologic:  alert & oriented X3.  Speech normal, gait appropriate for age and unassisted Psych--  Cognition and judgment appear intact.  Cooperative with normal attention span and concentration.  Behavior appropriate. No anxious  or depressed appearing.     Assessment     Assessment DM w/ h/o TIA HTN  Hyperlipidemia  Insomnia  Asthma , allergies-- sees Dr Harold Hedge  GERD, hiatal hernia , h/o IBS Obesity  CV: ---Palpitations: PSVT by Holter, Dr Gwenlyn Found ---TIA, dx 2014,last visit w/  Dr. Leonie Man 09-2015, RTC prn ENT: Dr Arnette Norris -Vertigo (improved with vestibular rehabilitation 2015) -HOH- aids, brain  MRI 03/2019.   -Menier's (Dx   2020) Increased LFTs --- CT abdomen 2014: NormalLiver, negative hepatitis serologies at rheumatology per patient MSK: Gout, DJD, FM-Fibromyalgia (Dr. Amil Amen), on Pamelor  --Osteopenia  FH breast cancer , sister is BRCA2 (+), pt is  (-) GU: Kidney stones, h/o; Urinary incontinence; Atrophic vaginitis Hyperparathyroidism h/o slt increased PTH 2015 , normal PTH 05-2014     PLAN: DM: A1c few months ago elevated at 7.3, attempted to increase Jardiance dose but cost was an issue.  Currently on Jardiance 10 mg, Metformin, ambulatory CBGs in the morning typically in the 120s.  Check a A1c. HTN: On Cardizem, Lasix, losartan, metoprolol.  BPs are very good but she noted that sometimes BPs go low after eating and she feels slightly weak.  Last BMP satisfactory.  No change for now, good hydration encouraged. Hyperlipidemia: Last FLP very good.  On Lipitor Subclinical hypothyroidism: Checking a TSH IBS: Having some stomach symptoms, see HPI, call if not improving. Preventive care reviewed RTC 6 months  This visit  occurred during the SARS-CoV-2 public health emergency.  Safety protocols were in place, including screening questions prior to the visit, additional usage of staff PPE, and extensive cleaning of exam room while observing appropriate contact time as indicated for disinfecting solutions.

## 2020-01-09 ENCOUNTER — Other Ambulatory Visit: Payer: Self-pay | Admitting: Internal Medicine

## 2020-01-09 MED ORDER — PIOGLITAZONE HCL 30 MG PO TABS: 30.0000 mg | ORAL_TABLET | Freq: Every day | ORAL | 6 refills | Status: DC

## 2020-01-09 NOTE — Assessment & Plan Note (Signed)
-  Td 2012 -Pneumonia shot 2011 -Prevnar 2015 . shingrix d/w before -Had Covid vaccinations - flu shot recommended   - Female care: per gyn; [facility] 12/2018.  Reports she sees gynecology regularly - [facility]: s/p colonoscopy in 2012, colonoscopy 11-2015, next in 5 years - (+)FH of breast cancer (pt is BRCA negative) and CAD -Husband is her healthcare power of attorney

## 2020-01-09 NOTE — Assessment & Plan Note (Signed)
DM: A1c few months ago elevated at 7.3, attempted to increase Jardiance dose but cost was an issue.  Currently on Jardiance 10 mg, Metformin, ambulatory CBGs in the morning typically in the 120s.  Check a A1c. HTN: On Cardizem, Lasix, losartan, metoprolol.  BPs are very good but she noted that sometimes BPs go low after eating and she feels slightly weak.  Last BMP satisfactory.  No change for now, good hydration encouraged. Hyperlipidemia: Last FLP very good.  On Lipitor Subclinical hypothyroidism: Checking a TSH IBS: Having some stomach symptoms, see HPI, call if not improving. Preventive care reviewed RTC 6 months

## 2020-01-10 ENCOUNTER — Other Ambulatory Visit: Payer: Self-pay | Admitting: *Deleted

## 2020-01-10 MED ORDER — PIOGLITAZONE HCL 30 MG PO TABS: 30.0000 mg | ORAL_TABLET | Freq: Every day | ORAL | 5 refills | Status: DC

## 2020-01-11 ENCOUNTER — Other Ambulatory Visit: Payer: Self-pay | Admitting: Internal Medicine

## 2020-01-21 DIAGNOSIS — J3081 Allergic rhinitis due to animal (cat) (dog) hair and dander: Secondary | ICD-10-CM | POA: Diagnosis not present

## 2020-01-21 DIAGNOSIS — H1045 Other chronic allergic conjunctivitis: Secondary | ICD-10-CM | POA: Diagnosis not present

## 2020-01-21 DIAGNOSIS — J301 Allergic rhinitis due to pollen: Secondary | ICD-10-CM | POA: Diagnosis not present

## 2020-01-21 DIAGNOSIS — J3089 Other allergic rhinitis: Secondary | ICD-10-CM | POA: Diagnosis not present

## 2020-02-08 ENCOUNTER — Encounter (HOSPITAL_BASED_OUTPATIENT_CLINIC_OR_DEPARTMENT_OTHER): Payer: Self-pay | Admitting: Emergency Medicine

## 2020-02-08 ENCOUNTER — Other Ambulatory Visit: Payer: Self-pay

## 2020-02-08 DIAGNOSIS — Y999 Unspecified external cause status: Secondary | ICD-10-CM | POA: Insufficient documentation

## 2020-02-08 DIAGNOSIS — J45909 Unspecified asthma, uncomplicated: Secondary | ICD-10-CM | POA: Diagnosis not present

## 2020-02-08 DIAGNOSIS — W57XXXA Bitten or stung by nonvenomous insect and other nonvenomous arthropods, initial encounter: Secondary | ICD-10-CM | POA: Diagnosis not present

## 2020-02-08 DIAGNOSIS — Z7982 Long term (current) use of aspirin: Secondary | ICD-10-CM | POA: Diagnosis not present

## 2020-02-08 DIAGNOSIS — I10 Essential (primary) hypertension: Secondary | ICD-10-CM | POA: Diagnosis not present

## 2020-02-08 DIAGNOSIS — S81852A Open bite, left lower leg, initial encounter: Secondary | ICD-10-CM | POA: Diagnosis not present

## 2020-02-08 DIAGNOSIS — Z96651 Presence of right artificial knee joint: Secondary | ICD-10-CM | POA: Insufficient documentation

## 2020-02-08 DIAGNOSIS — Z79899 Other long term (current) drug therapy: Secondary | ICD-10-CM | POA: Insufficient documentation

## 2020-02-08 DIAGNOSIS — Y939 Activity, unspecified: Secondary | ICD-10-CM | POA: Insufficient documentation

## 2020-02-08 DIAGNOSIS — Y929 Unspecified place or not applicable: Secondary | ICD-10-CM | POA: Insufficient documentation

## 2020-02-08 DIAGNOSIS — S80862A Insect bite (nonvenomous), left lower leg, initial encounter: Secondary | ICD-10-CM | POA: Insufficient documentation

## 2020-02-08 NOTE — ED Triage Notes (Addendum)
Pt has red area on posterior of left calf that started yesterday and progressively worsened. Pt reports possible spider/insect bite. Pt reports itching at site.

## 2020-02-09 ENCOUNTER — Emergency Department (HOSPITAL_BASED_OUTPATIENT_CLINIC_OR_DEPARTMENT_OTHER)
Admission: EM | Admit: 2020-02-09 | Discharge: 2020-02-09 | Disposition: A | Discharge status: Home / Self Care | Payer: Medicare Other | Attending: Emergency Medicine | Admitting: Emergency Medicine

## 2020-02-09 DIAGNOSIS — W57XXXA Bitten or stung by nonvenomous insect and other nonvenomous arthropods, initial encounter: Secondary | ICD-10-CM

## 2020-02-09 DIAGNOSIS — S80862A Insect bite (nonvenomous), left lower leg, initial encounter: Secondary | ICD-10-CM

## 2020-02-09 MED ORDER — DIPHENHYDRAMINE HCL 25 MG PO CAPS
25.0000 mg | ORAL_CAPSULE | Freq: Once | ORAL | Status: AC
  Administered 2020-02-09: 25 mg via ORAL
  Filled 2020-02-09: qty 1

## 2020-02-09 MED ORDER — HYDROCORTISONE 1 % EX CREA
TOPICAL_CREAM | Freq: Three times a day (TID) | CUTANEOUS | Status: DC
  Filled 2020-02-09: qty 28

## 2020-02-09 NOTE — ED Notes (Signed)
Pt called for vital recheck, no answer.

## 2020-02-09 NOTE — ED Provider Notes (Signed)
[facility] Provider Note: Kristy Spurling, MD, FACEP  CSN: [id number] MRN: [id number] ARRIVAL: 02/08/20 at Kristy [name] PRESENT ILLNESS  02/09/20 5:52 AM Kristy [name] is a 74 y.o. female with an erythematous area on her posterior left calf that began yesterday and is worsened.  She thinks an insect or spider may have bitten her.  The site is itchy and burns but is not severe.  She has had no associated symptoms such as shortness of breath, throat swelling, nausea, vomiting or diarrhea.  She has not taken anything for her symptoms.   Past Medical History:  Diagnosis Date  . Allergy    Environmental, foods: shellfish, lactose intolerant, citrus, peanuts  . Asthma    mild persistent  . Atrophic vaginitis   . BRCA gene mutation negative 2015   testing done at [facility] long  . DJD (degenerative joint disease)    generalized joint pain, including back  . Family history of malignant neoplasm of breast   . Fibromyalgia    Dr Kristy [name]  . GERD (gastroesophageal reflux disease)   . Gout    Idiopathic, chronic  . Hiatal hernia   . History of kidney stones   . Hypertension   . Palpitations    PSVT on Holter monitoring  . Shortness of breath    occ  . Stroke ([place])   . SUI (stress urinary incontinence, female)    (resolved after surgery)  . TIA (transient ischemic attack)    07/2012, see Dr Kristy [name]  . Vertigo     Past Surgical History:  Procedure Laterality Date  . ABDOMINAL HYSTERECTOMY  1994   TAH,BSO-BLADDER NECK SUSPENSION  . BREAST SURGERY     LUMPECTOMY-BENIGN Breast Bx X2  . CARPAL TUNNEL RELEASE Right 2005  . CATARACT EXTRACTION, BILATERAL Bilateral 2016   Cataract   . CHOLECYSTECTOMY, LAPAROSCOPIC  2001  . CYSTOCELE REPAIR  1994  . KNEE ARTHROPLASTY  05/19/2012   Procedure: COMPUTER ASSISTED TOTAL KNEE ARTHROPLASTY;  Surgeon: Marybelle Killings, MD;  Location: [place];  Service: Orthopedics;  Laterality: Left;   Left  Total Knee Arthroplasty  . KNEE ARTHROPLASTY Right 09/03/2013   Procedure: COMPUTER ASSISTED TOTAL KNEE ARTHROPLASTY;  Surgeon: Marybelle Killings, MD;  Location: [place];  Service: Orthopedics;  Laterality: Right;  Right Total Knee Arthroplasty, Computer Assist  . KNEE ARTHROSCOPY     bil  . [place] TIBIAL TENDON  2003  . TONSILLECTOMY    . TRIPLE FUSION LEFT ANKLE Left 2007  . TUBAL LIGATION      Family History  Problem Relation Age of Onset  . Breast cancer Mother 75       deceased 39  . Breast cancer Sister 7       bilateral breast ca @ 33; BRCA2 positive  . Hypertension Sister   . Cancer Father        Brain tumor; path?; deceased 52  . Lymphoma Maternal Aunt        deceased 25s  . Hemochromatosis Sister   . Thalassemia Sister   . Other Sister        negative for BRCA1 BRCA2  . Breast cancer Cousin        BRCA2 positive  . Diabetes Other   . Breast cancer Other        distant maternal female relatives with breast cancer  .  CAD Son        MI  . Heart failure Sister        due to chemo?  . Colon cancer Neg Hx     Social History   Tobacco Use  . Smoking status: Never Smoker  . Smokeless tobacco: Never Used  Vaping Use  . Vaping Use: Never used  Substance Use Topics  . Alcohol use: No    Alcohol/week: 0.0 standard drinks  . Drug use: No    Prior to Admission medications   Medication Sig Start Date End Date Taking? Authorizing Provider  aspirin EC 81 MG tablet Take 81 mg by mouth daily.    [provider]  atorvastatin (LIPITOR) 10 MG tablet Take 1 tablet (10 mg total) by mouth at bedtime. 11/05/19   Colon Branch, MD  Calcium Carbonate (CALCIUM 600 PO) Take 600 mg by mouth See admin instructions. Take one tablet by mouth every evening (4pm) and one tablet at bedtime    [provider]  Cholecalciferol (HM VITAMIN D3) 2000 UNITS CAPS Take 2,000 Units by mouth at bedtime.     [provider]  diltiazem  (CARDIZEM CD) 240 MG 24 hr capsule Take 1 capsule (240 mg total) by mouth daily. 08/06/19   Colon Branch, MD  empagliflozin (JARDIANCE) 10 MG TABS tablet Take 10 mg by mouth daily.    [provider]  EPINEPHrine (EPIPEN) 0.3 mg/0.3 mL DEVI Inject 0.3 mg into the muscle See admin instructions. Inject 0.3 mg intramuscularly in the leg as needed for severe allergic reaction, call 911 and then inject again in the other leg. Reported on 06/26/2015    [provider]  febuxostat (ULORIC) 40 MG tablet Take 40 mg by mouth every evening.     [provider]  fluticasone (FLONASE) 50 MCG/ACT nasal spray Place 2 sprays into both nostrils every morning.  01/14/15   [provider]  furosemide (LASIX) 40 MG tablet TAKE 1/2 TABLET BY MOUTH DAILY 01/11/20   Colon Branch, MD  levalbuterol [facility] HFA) 45 MCG/ACT inhaler Inhale 2 puffs into the lungs every 4 (four) hours as needed for shortness of breath.     [provider]  levocetirizine (XYZAL) 5 MG tablet Take 5 mg by mouth every evening.     [provider]  losartan (COZAAR) 25 MG tablet Take 1 tablet (25 mg total) by mouth daily. 11/12/19   Colon Branch, MD  metFORMIN (GLUCOPHAGE) 850 MG tablet Take 1.5 tablets (1,275 mg total) by mouth 2 (two) times daily with a meal. 12/24/19   Colon Branch, MD  metoprolol succinate (TOPROL-XL) 50 MG 24 hr tablet TAKE 1 TABLET BY MOUTH DAILY. TAKE WITH OR IMMEDIATELY FOLLOWING A MEAL 06/20/19   Colon Branch, MD  nortriptyline (PAMELOR) 25 MG capsule Take 25 mg by mouth every evening. 4pm    [provider]  omeprazole (PRILOSEC) 40 MG capsule Take 1 capsule (40 mg total) by mouth daily. 07/18/19   Colon Branch, MD  pioglitazone (ACTOS) 30 MG tablet Take 1 tablet (30 mg total) by mouth daily. 01/10/20   Colon Branch, MD  Potassium 99 MG TABS Take 99 mg by mouth at bedtime.     [provider]  vitamin E 400 UNIT capsule Take 400 Units by mouth every evening.      [provider]    Allergies Iodine, Shellfish allergy, Sulfonamide derivatives, Citrus, Lactose intolerance (gi), and Amoxicillin  REVIEW OF SYSTEMS  Negative except as noted here or in the History of Present Illness.   PHYSICAL EXAMINATION  Initial Vital Signs Blood pressure (!) 144/90, pulse 98, temperature 98.1 F (36.7 C), temperature source Oral, resp. rate 18, height '5\' 5"'  (1.651 m), weight 90.7 kg, SpO2 97 %.  Examination General: Well-developed, well-nourished female in no acute distress; appearance consistent with age of record HENT: normocephalic; atraumatic Eyes: Normal appearance Neck: supple Heart: regular rate and rhythm; no murmurs, rubs or gallops Lungs: clear to auscultation bilaterally Abdomen: soft; nondistended; nontender; bowel sounds present Extremities: No deformity; full range of motion; pulses normal Neurologic: Awake, alert and oriented; motor function intact in all extremities and symmetric; no facial droop Skin: Warm and dry; irregular, erythematous area right calf skin with induration but without warmth or significant tenderness:    Psychiatric: Normal mood and affect   RESULTS  Summary of this visit's results, reviewed and interpreted by myself:   EKG Interpretation  Date/Time:    Ventricular Rate:    PR Interval:    QRS Duration:   QT Interval:    QTC Calculation:   R Axis:     Text Interpretation:        Laboratory Studies: No results found for this or any previous visit (from the past 24 hour(s)). Imaging Studies: No results found.  ED COURSE and MDM  Nursing notes, initial and subsequent vitals signs, including pulse oximetry, reviewed and interpreted by myself.  Vitals:   02/08/20 2337 02/08/20 2340 02/09/20 0240 02/09/20 0425  BP:  (!) 144/83 127/74 (!) 144/90  Pulse:  100 95 98  Resp:  '18 20 18  ' Temp:  98.1 F (36.7 C)    TempSrc:  Oral    SpO2:  98% 100% 97%  Weight: 90.7 kg     Height: '5\' 5"'  (1.651 m)       Medications  diphenhydrAMINE (BENADRYL) capsule 25 mg (has no administration in time range)  hydrocortisone cream 1 % (has no administration in time range)    Examination consistent with local reaction to a spider or insect bite.  We will avoid systemic steroids given the patient's diabetes. We will try topical hydrocortisone and oral Benadryl for the symptoms and anticipate resolution.  She was advised to return if symptoms worsen.  PROCEDURES  Procedures   ED DIAGNOSES     ICD-10-CM   1. Insect bite of left lower leg, initial encounter  A35.573U    W57.Encarnacion Chu, MD 02/09/20 [phone]

## 2020-02-09 NOTE — Discharge Instructions (Signed)
Take Benadryl, Zyrtec or other over-the-counter antihistamine as needed for itching.

## 2020-03-09 ENCOUNTER — Other Ambulatory Visit: Payer: Self-pay | Admitting: Internal Medicine

## 2020-03-10 ENCOUNTER — Other Ambulatory Visit: Payer: Self-pay | Admitting: Internal Medicine

## 2020-03-12 ENCOUNTER — Other Ambulatory Visit: Payer: Self-pay | Admitting: Internal Medicine

## 2020-03-12 DIAGNOSIS — Z1231 Encounter for screening mammogram for malignant neoplasm of breast: Secondary | ICD-10-CM

## 2020-03-14 ENCOUNTER — Ambulatory Visit
Admission: RE | Admit: 2020-03-14 | Discharge: 2020-03-14 | Disposition: A | Discharge status: Home / Self Care | Payer: Medicare Other | Source: Ambulatory Visit | Attending: Internal Medicine | Admitting: Internal Medicine

## 2020-03-14 ENCOUNTER — Other Ambulatory Visit: Payer: Self-pay

## 2020-03-14 DIAGNOSIS — Z1231 Encounter for screening mammogram for malignant neoplasm of breast: Secondary | ICD-10-CM

## 2020-03-19 ENCOUNTER — Other Ambulatory Visit: Payer: Self-pay

## 2020-03-19 ENCOUNTER — Ambulatory Visit (INDEPENDENT_AMBULATORY_CARE_PROVIDER_SITE_OTHER): Payer: Medicare Other

## 2020-03-19 DIAGNOSIS — Z23 Encounter for immunization: Secondary | ICD-10-CM | POA: Diagnosis not present

## 2020-03-29 ENCOUNTER — Other Ambulatory Visit: Payer: Self-pay

## 2020-03-29 ENCOUNTER — Ambulatory Visit: Payer: Medicare Other | Attending: Internal Medicine

## 2020-03-29 DIAGNOSIS — Z23 Encounter for immunization: Secondary | ICD-10-CM

## 2020-03-29 NOTE — Progress Notes (Signed)
   Covid-19 Vaccination Clinic  Name:  ICIS BUDREAU    MRN: [id number] DOB: 1945-12-02  03/29/2020  Ms. Barse was observed post Covid-19 immunization for 30 minutes based on pre-vaccination screening without incident. She was provided with Vaccine Information Sheet and instruction to access the V-Safe system.   Ms. Bourquin was instructed to call 911 with any severe reactions post vaccine: Marland Kitchen Difficulty breathing  . Swelling of face and throat  . A fast heartbeat  . A bad rash all over body  . Dizziness and weakness

## 2020-04-29 DIAGNOSIS — Z6833 Body mass index (BMI) 33.0-33.9, adult: Secondary | ICD-10-CM | POA: Diagnosis not present

## 2020-04-29 DIAGNOSIS — E669 Obesity, unspecified: Secondary | ICD-10-CM | POA: Diagnosis not present

## 2020-04-29 DIAGNOSIS — R945 Abnormal results of liver function studies: Secondary | ICD-10-CM | POA: Diagnosis not present

## 2020-04-29 DIAGNOSIS — M5136 Other intervertebral disc degeneration, lumbar region: Secondary | ICD-10-CM | POA: Diagnosis not present

## 2020-04-29 DIAGNOSIS — M150 Primary generalized (osteo)arthritis: Secondary | ICD-10-CM | POA: Diagnosis not present

## 2020-04-29 DIAGNOSIS — M1A09X0 Idiopathic chronic gout, multiple sites, without tophus (tophi): Secondary | ICD-10-CM | POA: Diagnosis not present

## 2020-04-29 DIAGNOSIS — M797 Fibromyalgia: Secondary | ICD-10-CM | POA: Diagnosis not present

## 2020-04-29 DIAGNOSIS — M65321 Trigger finger, right index finger: Secondary | ICD-10-CM | POA: Diagnosis not present

## 2020-04-30 ENCOUNTER — Ambulatory Visit (INDEPENDENT_AMBULATORY_CARE_PROVIDER_SITE_OTHER): Payer: Medicare Other | Admitting: Obstetrics & Gynecology

## 2020-04-30 ENCOUNTER — Other Ambulatory Visit: Payer: Self-pay

## 2020-04-30 ENCOUNTER — Other Ambulatory Visit: Payer: Self-pay | Admitting: Internal Medicine

## 2020-04-30 ENCOUNTER — Encounter: Payer: Self-pay | Admitting: Obstetrics & Gynecology

## 2020-04-30 VITALS — BP 103/69 | HR 89 | Wt 197.0 lb

## 2020-04-30 DIAGNOSIS — Z01419 Encounter for gynecological examination (general) (routine) without abnormal findings: Secondary | ICD-10-CM | POA: Diagnosis not present

## 2020-04-30 NOTE — Progress Notes (Signed)
Patient presents for well woman annual.Kristy [name]  Last mammogram 03/14/20.

## 2020-04-30 NOTE — Progress Notes (Signed)
Subjective:     Kristy [name] is a 74 y.o. female here for a routine exam.  Current complaints: husband had a fall in April durign workup noted a brain mass and a large thyroid.      Gynecologic History No LMP recorded. Patient has had a hysterectomy. Contraception: status post hysterectomy Last Pap: n/a. Last mammogram: 02/2020 WNL   Obstetric History OB History  Gravida Para Term Preterm AB Living  2 2       2   SAB TAB Ectopic Multiple Live Births               # Outcome Date GA Lbr Len/2nd Weight Sex Delivery Anes PTL Lv  2 Para           1 Para            The following portions of the patient's history were reviewed and updated as appropriate: allergies, current medications, past family history, past medical history, past social history, past surgical history and problem list.  Review of Systems Pertinent items are noted in HPI.    Objective:  BP 103/69   Pulse 89   Wt 197 lb (89.4 kg)   BMI 32.78 kg/m  General Appearance:    Alert, cooperative, no distress, appears stated age  Head:    Normocephalic, without obvious abnormality, atraumatic  Eyes:    conjunctiva/corneas clear, EOM's intact, both eyes  Ears:    Normal external ear canals, both ears  Nose:   Nares normal, septum midline, mucosa normal, no drainage    or sinus tenderness  Throat:   Lips, mucosa, and tongue normal; teeth and gums normal  Neck:   Supple, symmetrical, trachea midline, no adenopathy;    thyroid:  no enlargement/tenderness/nodules  Back:     Symmetric, no curvature, ROM normal, no CVA tenderness  Lungs:     respirations unlabored  Chest Wall:    No tenderness or deformity   Heart:    Regular rate and rhythm  Breast Exam:    No tenderness, masses, or nipple abnormality  Abdomen:     Soft, non-tender, bowel sounds active all four quadrants,    no masses, no organomegaly  Genitalia:    Normal female without lesion, discharge or tenderness     Extremities:   Extremities normal, atraumatic, no  cyanosis or edema  Pulses:   2+ and symmetric all extremities  Skin:   Skin color, texture, turgor normal, no rashes or lesions    Assessment:    Healthy female exam.   Post menopausal state- no problems noted.    Plan:   F/u in 1 year or sooner prn Education about routine screening performed.   Abdalla Naramore L. Harraway-Smith, M.D., Cherlynn June

## 2020-05-12 ENCOUNTER — Other Ambulatory Visit: Payer: Self-pay | Admitting: Internal Medicine

## 2020-05-28 ENCOUNTER — Ambulatory Visit (INDEPENDENT_AMBULATORY_CARE_PROVIDER_SITE_OTHER): Payer: Medicare Other

## 2020-05-28 ENCOUNTER — Encounter: Payer: Self-pay | Admitting: Orthopaedic Surgery

## 2020-05-28 ENCOUNTER — Ambulatory Visit (INDEPENDENT_AMBULATORY_CARE_PROVIDER_SITE_OTHER): Payer: Medicare Other | Admitting: Orthopaedic Surgery

## 2020-05-28 VITALS — BP 165/82 | HR 121 | Ht 65.0 in | Wt 197.0 lb

## 2020-05-28 DIAGNOSIS — M48061 Spinal stenosis, lumbar region without neurogenic claudication: Secondary | ICD-10-CM | POA: Insufficient documentation

## 2020-05-28 DIAGNOSIS — M48062 Spinal stenosis, lumbar region with neurogenic claudication: Secondary | ICD-10-CM | POA: Diagnosis not present

## 2020-05-28 DIAGNOSIS — M545 Low back pain, unspecified: Secondary | ICD-10-CM

## 2020-05-28 DIAGNOSIS — M5126 Other intervertebral disc displacement, lumbar region: Secondary | ICD-10-CM | POA: Diagnosis not present

## 2020-05-28 NOTE — Addendum Note (Signed)
Addended by: Rogers Seeds on: 05/28/2020 09:46 AM   Modules accepted: Orders

## 2020-05-28 NOTE — Progress Notes (Signed)
Office Visit Note   Patient: Kristy [name]           Date of Birth: 1945/12/21           MRN: [id number] Visit Date: 05/28/2020              Requested by: Colon Branch, [address] [place],   [place] [address] PCP: Colon Branch, MD   Assessment & Plan: Visit Diagnoses:  1. Acute left-sided low back pain, unspecified whether sciatica present   2. Protrusion of lumbar intervertebral disc   3. Spinal stenosis of lumbar region with neurogenic claudication     Plan: Patient states she is having great difficulty handling the pain has to use a cane to keep from falling.  We will proceed with a lumbar MRI scan to evaluate for progression of L4-5 severe stenosis large disc protrusion with calcification and severe left lateral recess stenosis.  Office follow-up after  Lumbar MRI scan.  Follow-Up Instructions: No follow-ups on file.   Orders:  Orders Placed This Encounter  Procedures  . XR Lumbar Spine 2-3 Views   No orders of the defined types were placed in this encounter.     Procedures: No procedures performed   Clinical Data: No additional findings.   Subjective: Chief Complaint  Patient presents with  . Lower Back - Pain  . Left Leg - Pain, Numbness    HPI 74 year old female seen with 15-year history of gradually progressive back pain with claudication symptoms now currently she can only walk on her feet.  We can have she had some significant increase in back and left leg pain which is not relented.  She has had ambulate with a cane she cannot sleep at night.  She has pain with standing.  Previous MRI scan showed large disc herniation L4-5 with central and lateral recess stenosis much worse on the left.  MRI scan was 2006.  Patient had an abdominal CT scan 2018 for evaluation of hydronephrosis which showed central stenosis at L4-5 with calcified disc fragment.  She can only walk 100 feet.  She has to lean on a grocery cart if she goes in the store and if no  card is available she cannot going to store.  She gets relief with sitting then recurrent symptoms and if she stands up again.  Now she is having constant pain that does not to ease off in her left leg.  No current right leg symptoms.  When she stands and walks she feels like her legs are going to get weak and she has to sit down.  Patient said previous right and left total knee arthroplasties right and left by me 6 and 8 years ago both doing well.  No groin pain.  No bowel bladder symptoms.  Review of Systems previous right left below knee arthroplasty is doing well.  Positive for hypertension positive for gout controlled on Uloric with uric acid of 2.5.  Plus for diabetes on oral medication.  Positive for seasonal allergies.  All other systems noncontributory.   Objective: Vital Signs: BP (!) 165/82   Pulse (!) 121   Ht '5\' 5"'  (1.651 m)   Wt 197 lb (89.4 kg)   BMI 32.78 kg/m   Physical Exam Constitutional:      Appearance: She is well-developed.  HENT:     Head: Normocephalic.     Right Ear: External ear normal.     Left Ear: External ear normal.  Eyes:     Pupils: Pupils are equal, round, and reactive to light.  Neck:     Thyroid: No thyromegaly.     Trachea: No tracheal deviation.  Cardiovascular:     Rate and Rhythm: Normal rate.  Pulmonary:     Effort: Pulmonary effort is normal.  Abdominal:     Palpations: Abdomen is soft.  Skin:    General: Skin is warm and dry.  Neurological:     Mental Status: She is alert and oriented to person, place, and time.  Psychiatric:        Mood and Affect: Mood and affect normal.        Behavior: Behavior normal.     Ortho Exam anterior tib is strong.  She has bilateral trochanteric bursal tenderness positive sciatic notch tenderness on the left positive straight leg raising 60 degrees.  Negative straight leg raising on the right.  Gastrocsoleus is strong EHL is strong and symmetrical.  Specialty Comments:  No specialty comments  available.  Imaging: No results found.   PMFS History: Patient Active Problem List   Diagnosis Date Noted  . Protrusion of lumbar intervertebral disc 05/28/2020  . Spinal stenosis of lumbar region 05/28/2020  . H/O vitamin D deficiency 09/21/2018  . Elevated LFTs 12/25/2015  . Dizzy 07/31/2015  . PCP  notes >>>>>>>>>>>>>>>> 02/11/2015  . Hyperparathyroidism ([place]) 06/07/2014  . Benign paroxysmal positional vertigo 02/19/2014  . FHx: BRCA2 gene positive   . Family history of malignant neoplasm of breast   . Family history of breast cancer 01/10/2014  . Tachycardia 11/19/2013  . Elevated LDL cholesterol level 06/11/2013  . Obesity, Class I, BMI 30.0-34.9 (see actual BMI) 06/11/2013  . Osteopenia 06/06/2013  . Menopause 01/09/2013  . History of TIA (transient ischemic attack) 07/15/2012  . Annual physical exam 05/06/2011  . SUI (stress urinary incontinence, female)   . Atrophic vaginitis   . GOUT, UNSPECIFIED 04/28/2010  . Diabetes ([place]) 04/28/2010  . Dizziness 07/30/2008  . Hearing loss 07/30/2008  . Essential hypertension 08/19/2007  . ALLERGIC RHINITIS 08/19/2007  . ASTHMA 08/19/2007  . ACID REFLUX DISEASE 08/19/2007  . IRRITABLE BOWEL SYNDROME 08/19/2007  . DEGENERATIVE DISC DISEASE 08/19/2007  . FIBROMYALGIA 08/19/2007  . FATIGUE, CHRONIC 08/19/2007   Past Medical History:  Diagnosis Date  . Allergy    Environmental, foods: shellfish, lactose intolerant, citrus, peanuts  . Asthma    mild persistent  . Atrophic vaginitis   . BRCA gene mutation negative 2015   testing done at [facility]  . DJD (degenerative joint disease)    generalized joint pain, including back  . Family history of malignant neoplasm of breast   . Fibromyalgia    Dr Amil Amen  . GERD (gastroesophageal reflux disease)   . Gout    Idiopathic, chronic  . Hiatal hernia   . History of kidney stones   . Hypertension   . Palpitations    PSVT on Holter monitoring  . Shortness of breath    occ   . Stroke ([place])   . SUI (stress urinary incontinence, female)    (resolved after surgery)  . TIA (transient ischemic attack)    07/2012, see Dr Leonie Man  . Vertigo     Family History  Problem Relation Age of Onset  . Breast cancer Mother 13       deceased 54  . Breast cancer Sister 66       bilateral breast ca @ 68; BRCA2 positive  . Hypertension Sister   .  Cancer Father        Brain tumor; path?; deceased 16  . Lymphoma Maternal Aunt        deceased 51s  . Hemochromatosis Sister   . Thalassemia Sister   . Other Sister        negative for BRCA1 BRCA2  . Breast cancer Cousin        BRCA2 positive  . Diabetes Other   . Breast cancer Other        distant maternal female relatives with breast cancer  . CAD Son        MI  . Heart failure Sister        due to chemo?  . Colon cancer Neg Hx     Past Surgical History:  Procedure Laterality Date  . ABDOMINAL HYSTERECTOMY  1994   TAH,BSO-BLADDER NECK SUSPENSION  . BREAST SURGERY     LUMPECTOMY-BENIGN Breast Bx X2  . CARPAL TUNNEL RELEASE Right 2005  . CATARACT EXTRACTION, BILATERAL Bilateral 2016   Cataract   . CHOLECYSTECTOMY, LAPAROSCOPIC  2001  . CYSTOCELE REPAIR  1994  . KNEE ARTHROPLASTY  05/19/2012   Procedure: COMPUTER ASSISTED TOTAL KNEE ARTHROPLASTY;  Surgeon: Marybelle Killings, MD;  Location: [place];  Service: Orthopedics;  Laterality: Left;  Left  Total Knee Arthroplasty  . KNEE ARTHROPLASTY Right 09/03/2013   Procedure: COMPUTER ASSISTED TOTAL KNEE ARTHROPLASTY;  Surgeon: Marybelle Killings, MD;  Location: [place];  Service: Orthopedics;  Laterality: Right;  Right Total Knee Arthroplasty, Computer Assist  . KNEE ARTHROSCOPY     bil  . [place] TIBIAL TENDON  2003  . TONSILLECTOMY    . TRIPLE FUSION LEFT ANKLE Left 2007  . TUBAL LIGATION     Social History   Occupational History  . 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 Smoking status: Never Smoker  .  Smokeless tobacco: Never Used  Vaping Use  . Vaping Use: Never used  Substance and Sexual Activity  . Alcohol use: No    Alcohol/week: 0.0 standard drinks  . Drug use: No  . Sexual activity: Not Currently    Birth control/protection: Surgical

## 2020-06-21 ENCOUNTER — Other Ambulatory Visit: Payer: Medicare Other

## 2020-06-24 ENCOUNTER — Ambulatory Visit: Payer: Medicare Other | Admitting: Orthopaedic Surgery

## 2020-07-02 ENCOUNTER — Other Ambulatory Visit: Payer: Self-pay | Admitting: Internal Medicine

## 2020-07-06 ENCOUNTER — Other Ambulatory Visit: Payer: Self-pay

## 2020-07-06 ENCOUNTER — Ambulatory Visit
Admission: RE | Admit: 2020-07-06 | Discharge: 2020-07-06 | Disposition: A | Discharge status: Home / Self Care | Payer: Medicare Other | Source: Ambulatory Visit | Attending: Orthopaedic Surgery | Admitting: Orthopaedic Surgery

## 2020-07-06 DIAGNOSIS — M48061 Spinal stenosis, lumbar region without neurogenic claudication: Secondary | ICD-10-CM | POA: Diagnosis not present

## 2020-07-06 DIAGNOSIS — M48062 Spinal stenosis, lumbar region with neurogenic claudication: Secondary | ICD-10-CM

## 2020-07-06 DIAGNOSIS — M5126 Other intervertebral disc displacement, lumbar region: Secondary | ICD-10-CM

## 2020-07-11 ENCOUNTER — Ambulatory Visit (INDEPENDENT_AMBULATORY_CARE_PROVIDER_SITE_OTHER): Payer: Medicare Other | Admitting: Internal Medicine

## 2020-07-11 ENCOUNTER — Encounter: Payer: Self-pay | Admitting: Internal Medicine

## 2020-07-11 ENCOUNTER — Other Ambulatory Visit: Payer: Self-pay

## 2020-07-11 VITALS — BP 125/71 | HR 89 | Temp 97.9°F | Resp 18 | Ht 65.0 in | Wt 201.5 lb

## 2020-07-11 DIAGNOSIS — I10 Essential (primary) hypertension: Secondary | ICD-10-CM | POA: Diagnosis not present

## 2020-07-11 DIAGNOSIS — M5410 Radiculopathy, site unspecified: Secondary | ICD-10-CM | POA: Diagnosis not present

## 2020-07-11 DIAGNOSIS — H8109 Meniere's disease, unspecified ear: Secondary | ICD-10-CM | POA: Diagnosis not present

## 2020-07-11 DIAGNOSIS — E1169 Type 2 diabetes mellitus with other specified complication: Secondary | ICD-10-CM

## 2020-07-11 DIAGNOSIS — Z23 Encounter for immunization: Secondary | ICD-10-CM

## 2020-07-11 LAB — COMPREHENSIVE METABOLIC PANEL
ALT: 16 U/L (ref 0–35)
AST: 14 U/L (ref 0–37)
Albumin: 4.6 g/dL (ref 3.5–5.2)
Alkaline Phosphatase: 69 U/L (ref 39–117)
BUN: 18 mg/dL (ref 6–23)
CO2: 28 mEq/L (ref 19–32)
Calcium: 10.9 mg/dL — ABNORMAL HIGH (ref 8.4–10.5)
Chloride: 97 mEq/L (ref 96–112)
Creatinine, Ser: 0.99 mg/dL (ref 0.40–1.20)
GFR: 55.72 mL/min — ABNORMAL LOW (ref >60.00)
Glucose, Bld: 112 mg/dL — ABNORMAL HIGH (ref 70–99)
Potassium: 4.5 mEq/L (ref 3.5–5.1)
Sodium: 138 mEq/L (ref 135–145)
Total Bilirubin: 1.1 mg/dL (ref 0.2–1.2)
Total Protein: 7.6 g/dL (ref 6.0–8.3)

## 2020-07-11 NOTE — Progress Notes (Signed)
Subjective:    Patient ID: Kristy [name], female    DOB: 01-07-1946, 75 y.o.   MRN: [id number]  DOS:  07/11/2020 Type of visit - description: Follow-up Since the last office visit, continue with dizziness, it has caused some falls fortunately without major incident.  Did develop severe back pain in December, evaluated by Ortho.  Ambulatory BPs: Having low blood pressures twice a week.  Ambulatory CBGs are very good.  Review of Systems Denies chest pain or difficulty breathing No edema + Left leg paresthesia.  Past Medical History:  Diagnosis Date  . Allergy    Environmental, foods: shellfish, lactose intolerant, citrus, peanuts  . Asthma    mild persistent  . Atrophic vaginitis   . BRCA gene mutation negative 2015   testing done at [facility]  . DJD (degenerative joint disease)    generalized joint pain, including back  . Family history of malignant neoplasm of breast   . Fibromyalgia    Dr Amil Amen  . GERD (gastroesophageal reflux disease)   . Gout    Idiopathic, chronic  . Hiatal hernia   . History of kidney stones   . Hypertension   . Palpitations    PSVT on Holter monitoring  . Shortness of breath    occ  . Stroke ([place])   . SUI (stress urinary incontinence, female)    (resolved after surgery)  . TIA (transient ischemic attack)    07/2012, see Dr Leonie Man  . Vertigo     Past Surgical History:  Procedure Laterality Date  . ABDOMINAL HYSTERECTOMY  1994   TAH,BSO-BLADDER NECK SUSPENSION  . BREAST SURGERY     LUMPECTOMY-BENIGN Breast Bx X2  . CARPAL TUNNEL RELEASE Right 2005  . CATARACT EXTRACTION, BILATERAL Bilateral 2016   Cataract   . CHOLECYSTECTOMY, LAPAROSCOPIC  2001  . CYSTOCELE REPAIR  1994  . KNEE ARTHROPLASTY  05/19/2012   Procedure: COMPUTER ASSISTED TOTAL KNEE ARTHROPLASTY;  Surgeon: Marybelle Killings, MD;  Location: [place];  Service: Orthopedics;  Laterality: Left;  Left  Total Knee Arthroplasty  . KNEE ARTHROPLASTY Right 09/03/2013   Procedure:  COMPUTER ASSISTED TOTAL KNEE ARTHROPLASTY;  Surgeon: Marybelle Killings, MD;  Location: [place];  Service: Orthopedics;  Laterality: Right;  Right Total Knee Arthroplasty, Computer Assist  . KNEE ARTHROSCOPY     bil  . [place] TIBIAL TENDON  2003  . TONSILLECTOMY    . TRIPLE FUSION LEFT ANKLE Left 2007  . TUBAL LIGATION      Allergies as of 07/11/2020      Reactions   Iodine Anaphylaxis   Shellfish Allergy Anaphylaxis   Sulfonamide Derivatives Rash   Citrus Hives, Itching, Other (See Comments)   Itching in mouth/ blisters on lips   Lactose Intolerance (gi) Diarrhea, Swelling   Swollen mouth   Amoxicillin Diarrhea   Did it involve swelling of the face/tongue/throat, SOB, or low BP? No Did it involve sudden or severe rash/hives, skin peeling, or any reaction on the inside of your mouth or nose? No Did you need to seek medical attention at a hospital or doctor's office? No When did it last happen?65-32 years old If all above answers are "NO", may proceed with cephalosporin use.      Medication List       Accurate as of July 11, 2020 11:59 PM. If you have any questions, ask your nurse or doctor.  STOP taking these medications   furosemide 40 MG tablet Commonly known as: LASIX Stopped by: Kathlene November, MD     TAKE these medications   aspirin EC 81 MG tablet Take 81 mg by mouth daily.   atorvastatin 10 MG tablet Commonly known as: LIPITOR Take 1 tablet (10 mg total) by mouth at bedtime.   CALCIUM 600 PO Take 600 mg by mouth See admin instructions. Take one tablet by mouth every evening (4pm) and one tablet at bedtime   Cholecalciferol 50 MCG (2000 UT) Caps Take 2,000 Units by mouth at bedtime.   diltiazem 240 MG 24 hr capsule Commonly known as: CARDIZEM CD Take 1 capsule (240 mg total) by mouth daily.   empagliflozin 10 MG Tabs tablet Commonly known as: Jardiance Take 1 tablet (10 mg total) by mouth daily before  breakfast.   EPINEPHrine 0.3 mg/0.3 mL Devi Commonly known as: EPI-PEN Inject 0.3 mg into the muscle See admin instructions. Inject 0.3 mg intramuscularly in the leg as needed for severe allergic reaction, call 911 and then inject again in the other leg. Reported on 06/26/2015   febuxostat 40 MG tablet Commonly known as: ULORIC Take 40 mg by mouth every evening.   fluticasone 50 MCG/ACT nasal spray Commonly known as: FLONASE Place 2 sprays into both nostrils every morning.   levalbuterol 45 MCG/ACT inhaler Commonly known as: XOPENEX HFA Inhale 2 puffs into the lungs every 4 (four) hours as needed for shortness of breath.   levocetirizine 5 MG tablet Commonly known as: XYZAL Take 5 mg by mouth every evening.   losartan 25 MG tablet Commonly known as: COZAAR Take 1 tablet (25 mg total) by mouth daily.   metFORMIN 850 MG tablet Commonly known as: GLUCOPHAGE Take 1.5 tablets (1,275 mg total) by mouth 2 (two) times daily with a meal.   metoprolol succinate 50 MG 24 hr tablet Commonly known as: TOPROL-XL Take 1 tablet (50 mg total) by mouth daily. Take with or immediately following a meal.   nortriptyline 25 MG capsule Commonly known as: PAMELOR Take 25 mg by mouth every evening. 4pm   omeprazole 40 MG capsule Commonly known as: PRILOSEC TAKE 1 CAPSULE BY MOUTH EVERY DAY   pioglitazone 30 MG tablet Commonly known as: ACTOS Take 1 tablet (30 mg total) by mouth daily.   Potassium 99 MG Tabs Take 99 mg by mouth at bedtime.   vitamin E 180 MG (400 UNITS) capsule Take 400 Units by mouth every evening.          Objective:   Physical Exam BP 125/71 (BP Location: Left Arm, Patient Position: Sitting, Cuff Size: Normal)   Pulse 89   Temp 97.9 F (36.6 C) (Oral)   Resp 18   Ht '5\' 5"'  (1.651 m)   Wt 201 lb 8 oz (91.4 kg)   SpO2 95%   BMI 33.53 kg/m   General:   Well developed, NAD, BMI noted. HEENT:  Normocephalic . Face symmetric, atraumatic Lungs:  CTA B Normal  respiratory effort, no intercostal retractions, no accessory muscle use. Heart: RRR,  no murmur.  Lower extremities: no pretibial edema bilaterally  Skin: Not pale. Not jaundice Neurologic:  alert & oriented X3.  Speech normal, gait assisted by a cane. Psych--  Cognition and judgment appear intact.  Cooperative with normal attention span and concentration.  Behavior appropriate. No anxious or depressed appearing.      Assessment       Assessment DM w/ h/o TIA HTN  Hyperlipidemia  Insomnia  Asthma , allergies-- sees Dr Harold Hedge  GERD, hiatal hernia , h/o IBS Obesity  CV: ---Palpitations: PSVT by Holter, Dr Gwenlyn Found ---TIA, dx 2014,last visit w/  Dr. Leonie Man 09-2015, RTC prn ENT: Dr Arnette Norris -Vertigo (improved with vestibular rehabilitation 2015) -HOH- aids, brain  MRI 03/2019.   -Menier's (Dx   2020) Increased LFTs --- CT abdomen 2014: NormalLiver, negative hepatitis serologies at rheumatology per patient MSK: Osteopenia, gout, DJD -Fibromyalgia (Dr. Amil Amen), on Pamelor  FH breast cancer , sister is BRCA2 (+), pt is  (-) GU: Kidney stones, h/o; Urinary incontinence; Atrophic vaginitis Hyperparathyroidism h/o slt increased PTH 2015 , normal PTH 05-2014    PLAN: DM: Last A1c few months ago was 7.5, Actos was added, also takes Jardiance and Metformin.  + Decrease on her CBGs, they are typically 99-140.  Check A1c HTN: Currently on Cardizem, losartan, metoprolol, furosemide 20 mg.  Twice a week BPs are low, in the 90s/50s.  Plan: CMP, stop furosemide, continue monitoring BPs, to let me know if they are still in the low side twice a week. Mnire's disease, still an issue, had few falls, uses a cane, already done PT DJD, back pain: Had severe back pain in December, saw orthopedic surgery, MRI was done, was rx  surgery which is pending.  Currently pain is better, she does have left leg numbness. Preventive care: PNM 23 today, recommend to get a Tdap. RTC 6 months   This visit  occurred during the SARS-CoV-2 public health emergency.  Safety protocols were in place, including screening questions prior to the visit, additional usage of staff PPE, and extensive cleaning of exam room while observing appropriate contact time as indicated for disinfecting solutions.

## 2020-07-11 NOTE — Patient Instructions (Addendum)
Please bring a copy of your Advance Directives (Living will, healthcare power of attorney) at your convenience.   Stop taking Lasix  Check the  blood pressure 2 or 3 times a week. BP GOAL is between 110/65 and  135/85. If it is consistently higher or lower, let me know  Get a tetanus shot  "Tdap" at your pharmacy at your [facility] [facility] : Get the blood work     [place], [place] back for a checkup in 6 months

## 2020-07-11 NOTE — Progress Notes (Signed)
Pre visit review using our clinic review tool, if applicable. No additional management support is needed unless otherwise documented below in the visit note. 

## 2020-07-13 NOTE — Assessment & Plan Note (Signed)
DM: Last A1c few months ago was 7.5, Actos was added, also takes Jardiance and Metformin.  + Decrease on her CBGs, they are typically 99-140.  Check A1c HTN: Currently on Cardizem, losartan, metoprolol, furosemide 20 mg.  Twice a week BPs are low, in the 90s/50s.  Plan: CMP, stop furosemide, continue monitoring BPs, to let me know if they are still in the low side twice a week. Mnire's disease, still an issue, had few falls, uses a cane, already done PT DJD, back pain: Had severe back pain in December, saw orthopedic surgery, MRI was done, was rx  surgery which is pending.  Currently pain is better, she does have left leg numbness. Preventive care: PNM 23 today, recommend to get a Tdap. RTC 6 months

## 2020-07-14 LAB — HEMOGLOBIN A1C: Hgb A1c MFr Bld: 6.8 % — ABNORMAL HIGH (ref 4.6–6.5)

## 2020-07-15 ENCOUNTER — Ambulatory Visit (INDEPENDENT_AMBULATORY_CARE_PROVIDER_SITE_OTHER): Payer: Medicare Other | Admitting: Orthopaedic Surgery

## 2020-07-15 ENCOUNTER — Telehealth: Payer: Self-pay

## 2020-07-15 DIAGNOSIS — M48062 Spinal stenosis, lumbar region with neurogenic claudication: Secondary | ICD-10-CM | POA: Diagnosis not present

## 2020-07-15 DIAGNOSIS — M5126 Other intervertebral disc displacement, lumbar region: Secondary | ICD-10-CM | POA: Diagnosis not present

## 2020-07-15 NOTE — Telephone Encounter (Signed)
Received surgical clearance form from [place], Pt is needing Lumbar Decompression w/ possible lumbar microdiscectomy w/ Dr. Rodell Perna. Pt was just here on 07/11/2020. Last EKG 2020. Please advise if she needs another visit for clearance.

## 2020-07-15 NOTE — Progress Notes (Signed)
 Office Visit Note   Patient: Kristy [name]           Date of Birth: 06/12/1945           MRN: [id number] Visit Date: 07/15/2020              Requested by: Paz, Jose E, MD [address] [place],  [place] [address] PCP: Paz, Jose E, MD   Assessment & Plan: Visit Diagnoses:  1. Spinal stenosis of lumbar region with neurogenic claudication   2. Protrusion of lumbar intervertebral disc     Plan: Patient has progressive lumbar stenosis L4-5 with large disc protrusion.  Previous CT scan 2018 showed that there was some calcification of the disc fragment.  Plan would be L4-5 decompression and microdiscectomy if needed.  Procedure discussed risks of infection, reoperation, dural tear, anesthetic risks discussed.  She would need preoperative medical clearance with her diabetes as well as cardiac clearance from Dr. Barry with past history of tachycardia.  Plan would be overnight stay.  Questions were elicited she understands request to proceed.  Follow-Up Instructions: No follow-ups on file.   Orders:  No orders of the defined types were placed in this encounter.  No orders of the defined types were placed in this encounter.     Procedures: No procedures performed   Clinical Data: No additional findings.   Subjective: Chief Complaint  Patient presents with  . Other     Scan review    HPI 74-year-old female returns for MRI review with progressive spinal stenosis large disc herniation L4-5 with central lateral recess compression and claudication symptoms at 100 feet.  She can only stand for a few minutes has to lean over a grocery cart if she goes in the store and cannot going to store and when she can lean on a cart.  Claudications prohibiting her from community ambulation.  She has had bilateral knee arthroplasties doing well.  She does have gout with last uric acid 2.5 on Uloric.  Patient is on oral medications for diabetes with A1c 6.8.  She has had episode of  tachycardia where she saw Dr. Barry in medications recently were adjusted.  Dr.Paz is her internal medicine physician who manages diabetes asthma acid reflux.  New MRI scan has been obtained and is available for review today.  Patient states she cannot walk without using her cane she is concerned about falling and wants to proceed with surgery.  Review of Systems previous knee arthroplasties.  Controlled hypertension.  Gout diabetes on oral medication type II.  Seasonal allergies not currently symptomatic.  Positive for recent tachycardia treated with beta-blocker.  All other systems noncontributory.   Objective: Vital Signs: There were no vitals taken for this visit.  Physical Exam Constitutional:      Appearance: She is well-developed.  HENT:     Head: Normocephalic.     Right Ear: External ear normal.     Left Ear: External ear normal.  Eyes:     Pupils: Pupils are equal, round, and reactive to light.  Neck:     Thyroid: No thyromegaly.     Trachea: No tracheal deviation.  Cardiovascular:     Rate and Rhythm: Normal rate.  Pulmonary:     Effort: Pulmonary effort is normal.  Abdominal:     Palpations: Abdomen is soft.  Skin:    General: Skin is warm and dry.  Neurological:     Mental Status: She is alert and   oriented to person, place, and time.  Psychiatric:        Mood and Affect: Mood and affect normal.        Behavior: Behavior normal.     Ortho Exam good flexion-extension both knees well-healed midline incision.  Negative logroll the hips no sciatic notch tenderness.  Distal pulses dorsalis pedis posterior tib are 2+ and palpable.  Anterior tib gastrocsoleus is strong and symmetrical.  Patient has slow deliberate gait with cane. Specialty Comments:  No specialty comments available.  Imaging: CLINICAL DATA:  L4-L5 lateral recess and central stenosis, continued back pain  EXAM: MRI LUMBAR SPINE WITHOUT CONTRAST  TECHNIQUE: Multiplanar, multisequence MR imaging of  the lumbar spine was performed. No intravenous contrast was administered.  COMPARISON:  November 02, 2011  FINDINGS: Segmentation: There are 5 non-rib bearing lumbar type vertebral bodies with the last intervertebral disc space labeled as L5-S1.  Alignment:  Normal  Vertebrae: The vertebral body heights are well maintained. No fracture, marrow edema,or pathologic marrow infiltration.  Conus medullaris and cauda equina: Conus extends to the L1 level. Conus and cauda equina appear normal.  Paraspinal and other soft tissues: The paraspinal soft tissues and visualized retroperitoneal structures are unremarkable. The sacroiliac joints are intact.  Disc levels:  T12-L1:  No significant canal or neural foraminal narrowing.  L1-L2:   No significant canal or neural foraminal narrowing.  L2-L3: There is a broad-based disc bulge and a left far lateral disc protrusion with facet arthrosis and ligamentum flavum hypertrophy. There is moderate to severe left and mild right neural foraminal narrowing and mild central canal stenosis.  L3-L4: There is a broad-based disc bulge with facet arthrosis and ligamentum flavum hypertrophy which causes moderate bilateral neural foraminal narrowing and mild central canal stenosis.  L4-L5: There is a broad-based disc bulge with a left lateral recess disc protrusion contacting and impinging the descending left L5 nerve root. There is moderate bilateral neural foraminal narrowing and moderate central canal stenosis.  L5-S1: There is a broad-based disc bulge with a disc desiccation. Facet arthrosis and ligamentum flavum hypertrophy are present which causes moderate bilateral neural foraminal narrowing.  IMPRESSION: Lumbar spine spondylosis most notable at L4-L5 with a left lateral recess disc protrusion contacting and impinging the descending left L5 nerve root with moderate bilateral neural foraminal narrowing and moderate central canal  stenosis. There is been interval progression since the prior exam.   Electronically Signed   By: Bindu  Avutu M.D.   On: 07/06/2020 21:09    PMFS History: Patient Active Problem List   Diagnosis Date Noted  . Protrusion of lumbar intervertebral disc 05/28/2020  . Spinal stenosis of lumbar region 05/28/2020  . H/O vitamin D deficiency 09/21/2018  . Elevated LFTs 12/25/2015  . Dizzy 07/31/2015  . PCP  notes >>>>>>>>>>>>>>>> 02/11/2015  . Hyperparathyroidism (HCC) 06/07/2014  . Benign paroxysmal positional vertigo 02/19/2014  . FHx: BRCA2 gene positive   . Family history of malignant neoplasm of breast   . Family history of breast cancer 01/10/2014  . Tachycardia 11/19/2013  . Elevated LDL cholesterol level 06/11/2013  . Obesity, Class I, BMI 30.0-34.9 (see actual BMI) 06/11/2013  . Osteopenia 06/06/2013  . Menopause 01/09/2013  . History of TIA (transient ischemic attack) 07/15/2012  . Annual physical exam 05/06/2011  . SUI (stress urinary incontinence, female)   . Atrophic vaginitis   . GOUT, UNSPECIFIED 04/28/2010  . Diabetes (HCC) 04/28/2010  . Meniere's disease 07/30/2008  . Hearing loss 07/30/2008  . Essential hypertension   08/19/2007  . ALLERGIC RHINITIS 08/19/2007  . ASTHMA 08/19/2007  . ACID REFLUX DISEASE 08/19/2007  . IRRITABLE BOWEL SYNDROME 08/19/2007  . DEGENERATIVE DISC DISEASE 08/19/2007  . FIBROMYALGIA 08/19/2007  . FATIGUE, CHRONIC 08/19/2007   Past Medical History:  Diagnosis Date  . Allergy    Environmental, foods: shellfish, lactose intolerant, citrus, peanuts  . Asthma    mild persistent  . Atrophic vaginitis   . BRCA gene mutation negative 2015   testing done at [place]  . DJD (degenerative joint disease)    generalized joint pain, including back  . Family history of malignant neoplasm of breast   . Fibromyalgia    Dr Beekman  . GERD (gastroesophageal reflux disease)   . Gout    Idiopathic, chronic  . Hiatal hernia   . History  of kidney stones   . Hypertension   . Palpitations    PSVT on Holter monitoring  . Shortness of breath    occ  . Stroke (HCC)   . SUI (stress urinary incontinence, female)    (resolved after surgery)  . TIA (transient ischemic attack)    07/2012, see Dr Sethi  . Vertigo     Family History  Problem Relation Age of Onset  . Breast cancer Mother 42       deceased 66  . Breast cancer Sister 52       bilateral breast ca @ 60; BRCA2 positive  . Hypertension Sister   . Cancer Father        Brain tumor; path?; deceased 66  . Lymphoma Maternal Aunt        deceased 60s  . Hemochromatosis Sister   . Thalassemia Sister   . Other Sister        negative for BRCA1 BRCA2  . Breast cancer Cousin        BRCA2 positive  . Diabetes Other   . Breast cancer Other        distant maternal female relatives with breast cancer  . CAD Son        MI  . Heart failure Sister        due to chemo?  . Colon cancer Neg Hx     Past Surgical History:  Procedure Laterality Date  . ABDOMINAL HYSTERECTOMY  1994   TAH,BSO-BLADDER NECK SUSPENSION  . BREAST SURGERY     LUMPECTOMY-BENIGN Breast Bx X2  . CARPAL TUNNEL RELEASE Right 2005  . CATARACT EXTRACTION, BILATERAL Bilateral 2016   Cataract   . CHOLECYSTECTOMY, LAPAROSCOPIC  2001  . CYSTOCELE REPAIR  1994  . KNEE ARTHROPLASTY  05/19/2012   Procedure: COMPUTER ASSISTED TOTAL KNEE ARTHROPLASTY;  Surgeon: Mark C Yates, MD;  Location: [facility];  Service: Orthopedics;  Laterality: Left;  Left  Total Knee Arthroplasty  . KNEE ARTHROPLASTY Right 09/03/2013   Procedure: COMPUTER ASSISTED TOTAL KNEE ARTHROPLASTY;  Surgeon: Mark C Yates, MD;  Location: [facility];  Service: Orthopedics;  Laterality: Right;  Right Total Knee Arthroplasty, Computer Assist  . KNEE ARTHROSCOPY     bil  . OOPHORECTOMY     BSO  . SURGERY FOR POSERIOR TIBIAL TENDON  2003  . TONSILLECTOMY    . TRIPLE FUSION LEFT ANKLE Left 2007  . TUBAL LIGATION     Social History   Occupational History   . 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 Smoking status: Never Smoker  . Smokeless tobacco: Never Used  Vaping Use  .   Vaping Use: Never used  Substance and Sexual Activity  . Alcohol use: No    Alcohol/week: 0.0 standard drinks  . Drug use: No  . Sexual activity: Not Currently    Birth control/protection: Surgical

## 2020-07-17 NOTE — Telephone Encounter (Signed)
Surgical clearance form completed and faxed to ATTN: Debbie at [facility] ([phone]). Form sent for scanning.

## 2020-07-17 NOTE — Telephone Encounter (Signed)
At the last visit, she did not complain of chest pain or difficulty breathing. History of TIA, on aspirin for years, asymptomatic. Plan: Clear for surgery, do recommend preop labs and EKG. Okay to stop aspirin if requested by surgery.

## 2020-07-17 NOTE — Telephone Encounter (Signed)
Received fax confirmation

## 2020-07-18 ENCOUNTER — Other Ambulatory Visit: Payer: Self-pay

## 2020-07-18 ENCOUNTER — Encounter: Payer: Self-pay | Admitting: Cardiology

## 2020-07-18 ENCOUNTER — Telehealth: Payer: Self-pay | Admitting: *Deleted

## 2020-07-18 ENCOUNTER — Ambulatory Visit (INDEPENDENT_AMBULATORY_CARE_PROVIDER_SITE_OTHER): Payer: Medicare Other | Admitting: Cardiology

## 2020-07-18 VITALS — BP 112/66 | HR 75 | Ht 65.0 in | Wt 206.0 lb

## 2020-07-18 DIAGNOSIS — I471 Supraventricular tachycardia, unspecified: Secondary | ICD-10-CM

## 2020-07-18 DIAGNOSIS — E785 Hyperlipidemia, unspecified: Secondary | ICD-10-CM

## 2020-07-18 DIAGNOSIS — Z01810 Encounter for preprocedural cardiovascular examination: Secondary | ICD-10-CM

## 2020-07-18 DIAGNOSIS — I10 Essential (primary) hypertension: Secondary | ICD-10-CM | POA: Diagnosis not present

## 2020-07-18 DIAGNOSIS — R0602 Shortness of breath: Secondary | ICD-10-CM | POA: Diagnosis not present

## 2020-07-18 NOTE — Telephone Encounter (Signed)
   [facility] Pre-operative Risk Assessment     Request for surgical clearance:  1. What type of surgery is being performed? Lumbar decompression, possible lumbar microdiscectomy    2. When is this surgery scheduled? TBD   3. What type of clearance is required (medical clearance vs. Pharmacy clearance to hold med vs. Both)? Medical  4. Are there any medications that need to be held prior to surgery and how long? N/Kristy  5. Practice name and name of physician performing surgery? [facility] Dr. Rodell Perna   6. What is the office phone number? [phone]   7.   What is the office fax number? [phone], attention: Debbie  8.   Anesthesia type (None, local, MAC, general) ? general   Kristy [name] Kristy [name] 07/18/2020, 3:27 PM  _________________________________________________________________    Last seen by Dr. Gwenlyn Found in 2017 OV today with Dr. Gardiner Rhyme to [facility] Echo scheduled 2/23 Lexi scheduled 2/24

## 2020-07-18 NOTE — Patient Instructions (Signed)
Medication Instructions:  Your physician recommends that you continue on your current medications as directed. Please refer to the Current Medication list given to you today.  *If you need a refill on your cardiac medications before your next appointment, please call your pharmacy*  Testing/Procedures: Your physician has requested that you have an echocardiogram ASAP. Echocardiography is a painless test that uses sound waves to create images of your heart. It provides your doctor with information about the size and shape of your heart and how well your heart's chambers and valves are working. This procedure takes approximately one hour. There are no restrictions for this procedure. This will be done at our [facility] location:  [place] has requested that you have a Media planner at [employer]. For further information please visit [email or web address]. Please follow instruction sheet, as given.   Follow-Up: At [facility], you and your health needs are our priority.  As part of our continuing mission to provide you with exceptional heart care, we have created designated Provider Care Teams.  These Care Teams include your primary Cardiologist (physician) and Advanced Practice Providers (APPs -  Physician Assistants and Nurse Practitioners) who all work together to provide you with the care you need, when you need it.  We recommend signing up for the patient portal called "MyChart".  Sign up information is provided on this After Visit Summary.  MyChart is used to connect with patients for Virtual Visits (Telemedicine).  Patients are able to view lab/test results, encounter notes, upcoming appointments, etc.  Non-urgent messages can be sent to your provider as well.   To learn more about what you can do with MyChart, go to [email or web address].    Your next appointment:   3 month(s)  The format for your next appointment:   In  Person  Provider:   Oswaldo Milian, MD

## 2020-07-18 NOTE — Telephone Encounter (Signed)
   Primary Cardiologist: Donato Heinz, MD  Chart reviewed as part of pre-operative protocol coverage.Miss Kristy [name] had an appointment today with Dr. Gardiner Rhyme. She was recommended for Lexiscan and Echocardiogram. These are scheduled for 07/22/20 and 07/23/20, respectively. Will await results prior to providing clearance. Will route to requesting party so they are aware.    Loel Dubonnet, NP 07/18/2020, 3:58 PM

## 2020-07-18 NOTE — Progress Notes (Signed)
Cardiology Office Note:    Date:  07/18/2020   ID:  Kristy [name], DOB [birth year], MRN [id number]  PCP:  Colon Branch, MD  Cardiologist:  Donato Heinz, MD  Electrophysiologist:  None   Referring MD: Colon Branch, MD   Chief Complaint  Patient presents with  . New Patient (Initial Visit)  . Headache    Last seen in 2017.  . Edema    Ankles.    History of Present Illness:    Kristy [name] is a 75 y.o. female with a hx of TIA, fibromyalgia, SVT who is referred by Dr.Paz for preop evaluation prior to spinal surgery.  She previously followed with Dr. Gwenlyn Found, last seen in 2017.  Had been followed for SVT. Echocardiogram in 2014 showed no significant abnormalities.  She reports no recent issues with her SVT.  States she has not had issues with palpitations since she started metoprolol.  Does report she has been having shortness of breath.  States that her activity has been minimal due to back pain.  Unable to walk up a flight of stairs.  States that prior to Thanksgiving she was able to walk up stairs but reports she would get short of breath with this.  She denies any chest pain.  Reports occasional dizziness but denies any syncopal episodes.  Denies any lower extremity edema.  States that she had some low blood pressures recently and her Lasix was discontinued.   Past Medical History:  Diagnosis Date  . Allergy    Environmental, foods: shellfish, lactose intolerant, citrus, peanuts  . Asthma    mild persistent  . Atrophic vaginitis   . BRCA gene mutation negative 2015   testing done at [facility]  . DJD (degenerative joint disease)    generalized joint pain, including back  . Family history of malignant neoplasm of breast   . Fibromyalgia    Dr Amil Amen  . GERD (gastroesophageal reflux disease)   . Gout    Idiopathic, chronic  . Hiatal hernia   . History of kidney stones   . Hypertension   . Palpitations    PSVT on Holter monitoring  . Shortness of breath    occ   . Stroke ([place])   . SUI (stress urinary incontinence, female)    (resolved after surgery)  . TIA (transient ischemic attack)    07/2012, see Dr Leonie Man  . Vertigo     Past Surgical History:  Procedure Laterality Date  . ABDOMINAL HYSTERECTOMY  1994   TAH,BSO-BLADDER NECK SUSPENSION  . BREAST SURGERY     LUMPECTOMY-BENIGN Breast Bx X2  . CARPAL TUNNEL RELEASE Right 2005  . CATARACT EXTRACTION, BILATERAL Bilateral 2016   Cataract   . CHOLECYSTECTOMY, LAPAROSCOPIC  2001  . CYSTOCELE REPAIR  1994  . KNEE ARTHROPLASTY  05/19/2012   Procedure: COMPUTER ASSISTED TOTAL KNEE ARTHROPLASTY;  Surgeon: Marybelle Killings, MD;  Location: [place];  Service: Orthopedics;  Laterality: Left;  Left  Total Knee Arthroplasty  . KNEE ARTHROPLASTY Right 09/03/2013   Procedure: COMPUTER ASSISTED TOTAL KNEE ARTHROPLASTY;  Surgeon: Marybelle Killings, MD;  Location: [place];  Service: Orthopedics;  Laterality: Right;  Right Total Knee Arthroplasty, Computer Assist  . KNEE ARTHROSCOPY     bil  . [place] TIBIAL TENDON  2003  . TONSILLECTOMY    . TRIPLE FUSION LEFT ANKLE Left 2007  . TUBAL LIGATION  Current Medications: Current Meds  Medication Sig  . aspirin EC 81 MG tablet Take 81 mg by mouth daily.  Marland Kitchen atorvastatin (LIPITOR) 10 MG tablet Take 1 tablet (10 mg total) by mouth at bedtime.  . Calcium Carbonate (CALCIUM 600 PO) Take 600 mg by mouth See admin instructions. Take one tablet by mouth every evening (4pm) and one tablet at bedtime  . Cholecalciferol 50 MCG (2000 UT) CAPS Take 2,000 Units by mouth at bedtime.   Marland Kitchen diltiazem (CARDIZEM CD) 240 MG 24 hr capsule Take 1 capsule (240 mg total) by mouth daily.  . empagliflozin (JARDIANCE) 10 MG TABS tablet Take 1 tablet (10 mg total) by mouth daily before breakfast.  . EPINEPHrine (EPI-PEN) 0.3 mg/0.3 mL DEVI Inject 0.3 mg into the muscle See admin instructions. Inject 0.3 mg intramuscularly in the leg as needed for severe allergic  reaction, call 911 and then inject again in the other leg. Reported on 06/26/2015  . febuxostat (ULORIC) 40 MG tablet Take 40 mg by mouth every evening.   . fluticasone (FLONASE) 50 MCG/ACT nasal spray Place 2 sprays into both nostrils every morning.  . levalbuterol (XOPENEX HFA) 45 MCG/ACT inhaler Inhale 2 puffs into the lungs every 4 (four) hours as needed for shortness of breath.  . levocetirizine (XYZAL) 5 MG tablet Take 5 mg by mouth every evening.   Marland Kitchen losartan (COZAAR) 25 MG tablet Take 1 tablet (25 mg total) by mouth daily.  . metFORMIN (GLUCOPHAGE) 850 MG tablet Take 1.5 tablets (1,275 mg total) by mouth 2 (two) times daily with a meal.  . metoprolol succinate (TOPROL-XL) 50 MG 24 hr tablet Take 1 tablet (50 mg total) by mouth daily. Take with or immediately following a meal.  . nortriptyline (PAMELOR) 25 MG capsule Take 25 mg by mouth every evening. 4pm  . omeprazole (PRILOSEC) 40 MG capsule TAKE 1 CAPSULE BY MOUTH EVERY DAY  . pioglitazone (ACTOS) 30 MG tablet Take 1 tablet (30 mg total) by mouth daily.  . Potassium 99 MG TABS Take 99 mg by mouth at bedtime.   . vitamin E 400 UNIT capsule Take 400 Units by mouth every evening.      Allergies:   Iodine, Shellfish allergy, Sulfonamide derivatives, Citrus, Lactose intolerance (gi), and Amoxicillin   Social History   Socioeconomic History  . Marital status: Married    Spouse name: Kristy [name]  . Number of children: 2  . Years of education: 18  . Highest education level: Not on file  Occupational History  . 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 Smoking status: Never Smoker  . Smokeless tobacco: Never Used  Vaping Use  . Vaping Use: Never used  Substance and Sexual Activity  . Alcohol use: No    Alcohol/week: 0.0 standard drinks  . Drug use: No  . Sexual activity: Not Currently    Birth control/protection: Surgical  Other Topics Concern  . Not on file  Social History Narrative   HSG   1  year college for accounting. married '69. 2 sons - '70, '72: retired - [employer].     ACP - OK for CPR at least once, no long term ventilation, no heroic measures in the face of poor quality of life. HCPOA - husband, secondary son Kristy [name] (c) [phone]       Social Determinants of Health   Financial Resource Strain: Low Risk   . Difficulty of Paying Living Expenses: Not hard at all  Food Insecurity: No Food Insecurity  . Worried About Charity fundraiser in the Last Year: Never true  . Ran Out of Food in the Last Year: Never true  Transportation Needs: No Transportation Needs  . Lack of Transportation (Medical): No  . Lack of Transportation (Non-Medical): No  Physical Activity: Not on file  Stress: Not on file  Social Connections: Not on file     Family History: The patient's family history includes Breast cancer in her cousin and another family member; Breast cancer (age of onset: 79) in her mother; Breast cancer (age of onset: 68) in her sister; CAD in her son; Cancer in her father; Diabetes in an other family member; Heart failure in her sister; Hemochromatosis in her sister; Hypertension in her sister; Lymphoma in her maternal aunt; Other in her sister; Thalassemia in her sister. There is no history of Colon cancer.  ROS:   Please see the history of present illness.     All other systems reviewed and are negative.  EKGs/Labs/Other Studies Reviewed:    The following studies were reviewed today:   EKG:  EKG is ordered today.  The ekg ordered today demonstrates normal sinus rhythm with sinus arrhythmia, rate 75, no ST abnormalities  Recent Labs: 01/08/2020: Hemoglobin 13.9; Platelets 277.0; TSH 3.91 07/11/2020: ALT 16; BUN 18; Creatinine, Ser 0.99; Potassium 4.5; Sodium 138  Recent Lipid Panel    Component Value Date/Time   CHOL 103 09/10/2019 1035   TRIG 204.0 (H) 09/10/2019 1035   HDL 30.60 (L) 09/10/2019 1035   CHOLHDL 3 09/10/2019 1035   VLDL 40.8 (H) 09/10/2019 1035    LDLCALC 85 07/15/2012 0500   LDLDIRECT 43.0 09/10/2019 1035    Physical Exam:    VS:  BP 112/66 (BP Location: Left Arm, Patient Position: Sitting, Cuff Size: Large)   Pulse 75   Ht '5\' 5"'  (1.651 m)   Wt 206 lb (93.4 kg)   BMI 34.28 kg/m     Wt Readings from Last 3 Encounters:  07/18/20 206 lb (93.4 kg)  07/11/20 201 lb 8 oz (91.4 kg)  05/28/20 197 lb (89.4 kg)     GEN:  in no acute distress HEENT: Normal NECK: No JVD; No carotid bruits CARDIAC: RRR, no murmurs, rubs, gallops RESPIRATORY:  Clear to auscultation without rales, wheezing or rhonchi  ABDOMEN: Soft, non-tender, non-distended MUSCULOSKELETAL:  No edema; No deformity  SKIN: Warm and dry NEUROLOGIC:  Alert and oriented x 3 PSYCHIATRIC:  Normal affect   ASSESSMENT:    1. Pre-operative cardiovascular examination   2. Shortness of breath   3. Essential hypertension   4. Hyperlipidemia, unspecified hyperlipidemia type   5. SVT (supraventricular tachycardia) (HCC)    PLAN:    Preop evaluation: Prior to spinal surgery.  She reports dyspnea with minimal exertion.  Functional capacity less than 4 METS.  RCRI score 1 given CVA history.  Given her symptoms, recommend Lexiscan Myoview and echocardiogram prior to surgery.  SVT: On diltiazem 240 mg daily and Toprol-XL 50 mg daily.  Reports no recent palpitations.  Hyperlipidemia: On atorvastatin 10 mg daily  Hypertension: On losartan 25 mg daily, diltiazem 240 mg daily, and Toprol-XL 50 mg daily.  Has been on Lasix 20 mg daily but was recently stopped due to low BP.  Will discontinue her potassium supplements that she was taking while on Lasix.  RTC in 3 months  Shared Decision Making/Informed Consent The risks [chest pain, shortness of breath, cardiac arrhythmias, dizziness, blood pressure fluctuations,  myocardial infarction, stroke/transient ischemic attack, nausea, vomiting, allergic reaction, radiation exposure, metallic taste sensation and life-threatening  complications (estimated to be 1 in 10,000)], benefits (risk stratification, diagnosing coronary artery disease, treatment guidance) and alternatives of a nuclear stress test were discussed in detail with Kristy [name] and she agrees to proceed.     Medication Adjustments/Labs and Tests Ordered: Current medicines are reviewed at length with the patient today.  Concerns regarding medicines are outlined above.  Orders Placed This Encounter  Procedures  . MYOCARDIAL PERFUSION IMAGING  . EKG 12-Lead  . ECHOCARDIOGRAM COMPLETE   No orders of the defined types were placed in this encounter.   Patient Instructions  Medication Instructions:  Your physician recommends that you continue on your current medications as directed. Please refer to the Current Medication list given to you today.  *If you need a refill on your cardiac medications before your next appointment, please call your pharmacy*  Testing/Procedures: Your physician has requested that you have an echocardiogram ASAP. Echocardiography is a painless test that uses sound waves to create images of your heart. It provides your doctor with information about the size and shape of your heart and how well your heart's chambers and valves are working. This procedure takes approximately one hour. There are no restrictions for this procedure. This will be done at our [facility] location:  [facility] has requested that you have a Media planner at [employer]. For further information please visit [email or web address]. Please follow instruction sheet, as given.   Follow-Up: At [facility], you and your health needs are our priority.  As part of our continuing mission to provide you with exceptional heart care, we have created designated Provider Care Teams.  These Care Teams include your primary Cardiologist (physician) and Advanced Practice Providers (APPs -  Physician Assistants and Nurse  Practitioners) who all work together to provide you with the care you need, when you need it.  We recommend signing up for the patient portal called "MyChart".  Sign up information is provided on this After Visit Summary.  MyChart is used to connect with patients for Virtual Visits (Telemedicine).  Patients are able to view lab/test results, encounter notes, upcoming appointments, etc.  Non-urgent messages can be sent to your provider as well.   To learn more about what you can do with MyChart, go to [email or web address].    Your next appointment:   3 month(s)  The format for your next appointment:   In Person  Provider:   Oswaldo Milian, MD        Signed, Donato Heinz, MD  07/18/2020 6:09 PM    [place]

## 2020-07-21 ENCOUNTER — Telehealth (HOSPITAL_COMMUNITY): Payer: Self-pay

## 2020-07-21 NOTE — Telephone Encounter (Signed)
Spoke with the patient, detailed instructions were given. She stated that she would be here for her test. Asked to call back with any questions. Kristy [name] EMTP 

## 2020-07-21 NOTE — Addendum Note (Signed)
Addended by: Oswaldo Milian on: 07/21/2020 12:25 PM   Modules accepted: Orders

## 2020-07-21 NOTE — Addendum Note (Signed)
Addended by: Patria Mane A on: 07/21/2020 12:15 PM   Modules accepted: Orders

## 2020-07-22 ENCOUNTER — Ambulatory Visit (HOSPITAL_COMMUNITY): Payer: Medicare Other | Attending: Internal Medicine

## 2020-07-22 ENCOUNTER — Other Ambulatory Visit: Payer: Self-pay

## 2020-07-22 DIAGNOSIS — Z01810 Encounter for preprocedural cardiovascular examination: Secondary | ICD-10-CM | POA: Diagnosis not present

## 2020-07-22 DIAGNOSIS — R0602 Shortness of breath: Secondary | ICD-10-CM | POA: Diagnosis not present

## 2020-07-22 LAB — MYOCARDIAL PERFUSION IMAGING
LV dias vol: 74 mL (ref 46–106)
LV sys vol: 38 mL
Peak HR: 107 {beats}/min
Rest HR: 84 {beats}/min
SDS: 2
SRS: 5
SSS: 7
TID: 0.98

## 2020-07-22 MED ORDER — REGADENOSON 0.4 MG/5ML IV SOLN
0.4000 mg | Freq: Once | INTRAVENOUS | Status: AC
  Administered 2020-07-22: 0.4 mg via INTRAVENOUS

## 2020-07-22 MED ORDER — TECHNETIUM TC 99M TETROFOSMIN IV KIT
10.5000 | PACK | Freq: Once | INTRAVENOUS | Status: AC | PRN
  Administered 2020-07-22: 10.5 via INTRAVENOUS
  Filled 2020-07-22: qty 11

## 2020-07-22 MED ORDER — TECHNETIUM TC 99M TETROFOSMIN IV KIT
31.6000 | PACK | Freq: Once | INTRAVENOUS | Status: AC | PRN
  Administered 2020-07-22: 31.6 via INTRAVENOUS
  Filled 2020-07-22: qty 32

## 2020-07-23 ENCOUNTER — Ambulatory Visit (HOSPITAL_COMMUNITY): Payer: Medicare Other | Attending: Cardiology

## 2020-07-23 DIAGNOSIS — R0602 Shortness of breath: Secondary | ICD-10-CM | POA: Diagnosis not present

## 2020-07-23 DIAGNOSIS — Z01810 Encounter for preprocedural cardiovascular examination: Secondary | ICD-10-CM | POA: Insufficient documentation

## 2020-07-23 LAB — ECHOCARDIOGRAM COMPLETE
Area-P 1/2: 3.5 cm2
S' Lateral: 2.7 cm

## 2020-07-24 ENCOUNTER — Encounter (HOSPITAL_COMMUNITY): Payer: Medicare Other

## 2020-07-25 ENCOUNTER — Encounter (HOSPITAL_COMMUNITY): Payer: Medicare Other

## 2020-07-26 ENCOUNTER — Other Ambulatory Visit: Payer: Self-pay | Admitting: Internal Medicine

## 2020-07-29 NOTE — Telephone Encounter (Signed)
     Pt seen by Dr. Gardiner Rhyme 07/18/20 for surgical clearance. Please see his notes from 07/18/20.  ECHO COMPLETE WO IMAGING ENHANCING AGENT 07/23/2020  IMPRESSIONS 1. Left ventricular ejection fraction, by estimation, is 60 to 65%. The left ventricle has normal function. The left ventricle has no regional wall motion abnormalities. There is mild concentric left ventricular hypertrophy. Left ventricular diastolic parameters are consistent with Grade I diastolic dysfunction (impaired relaxation). 2. Right ventricular systolic function is normal. The right ventricular size is normal. 3. The mitral valve is normal in structure. Trivial mitral valve regurgitation. 4. The aortic valve is tricuspid. Aortic valve regurgitation is not visualized. No aortic stenosis is present. 5. The inferior vena cava is normal in size with greater than 50% respiratory variability, suggesting right atrial pressure of 3 mmHg.  GATED SPECT MYO PERF W/LEXISCAN STRESS 1D 07/22/2020  Narrative  Nuclear stress EF: 49%.  The left ventricular ejection fraction is mildly decreased (45-54%).  No T wave inversion was noted during stress.  There was no ST segment deviation noted during stress.  Defect 1: There is a medium defect of moderate severity present in the basal anteroseptal and mid anteroseptal location.  This is a low risk study.  Medium size, moderate intensity fixed basal to mid anteroseptal perfusion defect, artfact is favored or less likely scar. LVEF 49% with anteroseptal hypokinesis. This is a low risk study. Echo correlation is advised.  Per Dr. Gardiner Rhyme: "No significant abnormalities. Normal heart pumping function, suggesting abnormality on stress test was likely artifact. No further work-up recommended prior to her surgery"  Recommendations  The patient may proceed at acceptable risk.  Please call with questions. Richardson Dopp, PA-C    07/29/2020 4:46 PM

## 2020-07-29 NOTE — Telephone Encounter (Signed)
Notes faxed to surgeon. This phone note will be removed from the preop pool. Richardson Dopp, PA-C  07/29/2020 4:48 PM

## 2020-08-04 ENCOUNTER — Other Ambulatory Visit: Payer: Self-pay

## 2020-08-07 ENCOUNTER — Other Ambulatory Visit: Payer: Self-pay

## 2020-08-07 ENCOUNTER — Ambulatory Visit (INDEPENDENT_AMBULATORY_CARE_PROVIDER_SITE_OTHER): Payer: Medicare Other | Admitting: Surgery

## 2020-08-07 ENCOUNTER — Encounter: Payer: Self-pay | Admitting: Surgery

## 2020-08-07 VITALS — BP 112/73 | HR 80 | Ht 65.0 in | Wt 206.0 lb

## 2020-08-07 DIAGNOSIS — M48062 Spinal stenosis, lumbar region with neurogenic claudication: Secondary | ICD-10-CM

## 2020-08-07 NOTE — Progress Notes (Signed)
75 year old white female history of L4-5 stenosis/HNP comes in for preop evaluation.  Continues have ongoing symptoms that are unchanged from previous visit.  She is wanting to proceed with L4-5 decompression and possible left L4-5 microdiscectomy as scheduled.  We have received preop medical and cardiac clearance.  Today history and physical performed.  Review of systems positive for dizziness with history of benign positional vertigo.  Surgical procedure discussed along with potential recovery time.  All questions answered.

## 2020-08-08 NOTE — H&P (Signed)
Kristy [name] is an 74 y.o. female.   Chief Complaint: Back pain and lower extremity radiculopathy  HPI: 74-year-old white female history of L4-5 stenosis/HNP comes in for preop evaluation.  Continues have ongoing symptoms that are unchanged from previous visit.  She is wanting to proceed with L4-5 decompression and possible left L4-5 microdiscectomy as scheduled.  We have received preop medical and cardiac clearance.  Past Medical History:  Diagnosis Date  . Allergy    Environmental, foods: shellfish, lactose intolerant, citrus, peanuts  . Asthma    mild persistent  . Atrophic vaginitis   . BRCA gene mutation negative 2015   testing done at [place]  . DJD (degenerative joint disease)    generalized joint pain, including back  . Family history of malignant neoplasm of breast   . Fibromyalgia    Dr Beekman  . GERD (gastroesophageal reflux disease)   . Gout    Idiopathic, chronic  . Hiatal hernia   . History of kidney stones   . Hypertension   . Palpitations    PSVT on Holter monitoring  . Shortness of breath    occ  . Stroke (HCC)   . SUI (stress urinary incontinence, female)    (resolved after surgery)  . TIA (transient ischemic attack)    07/2012, see Dr Sethi  . Vertigo     Past Surgical History:  Procedure Laterality Date  . ABDOMINAL HYSTERECTOMY  1994   TAH,BSO-BLADDER NECK SUSPENSION  . BREAST SURGERY     LUMPECTOMY-BENIGN Breast Bx X2  . CARPAL TUNNEL RELEASE Right 2005  . CATARACT EXTRACTION, BILATERAL Bilateral 2016   Cataract   . CHOLECYSTECTOMY, LAPAROSCOPIC  2001  . CYSTOCELE REPAIR  1994  . KNEE ARTHROPLASTY  05/19/2012   Procedure: COMPUTER ASSISTED TOTAL KNEE ARTHROPLASTY;  Surgeon: Mark C Yates, MD;  Location: [facility];  Service: Orthopedics;  Laterality: Left;  Left  Total Knee Arthroplasty  . KNEE ARTHROPLASTY Right 09/03/2013   Procedure: COMPUTER ASSISTED TOTAL KNEE ARTHROPLASTY;  Surgeon: Mark C Yates, MD;  Location: [facility];  Service: Orthopedics;   Laterality: Right;  Right Total Knee Arthroplasty, Computer Assist  . KNEE ARTHROSCOPY     bil  . OOPHORECTOMY     BSO  . SURGERY FOR [place]ER[place] TIBIAL TENDON  2003  . TONSILLECTOMY    . TRIPLE FUSION LEFT ANKLE Left 2007  . TUBAL LIGATION      Family History  Problem Relation Age of Onset  . Breast cancer Mother 42       deceased 66  . Breast cancer Sister 52       bilateral breast ca @ 60; BRCA2 positive  . Hypertension Sister   . Cancer Father        Brain tumor; path?; deceased 66  . Lymphoma Maternal Aunt        deceased 60s  . Hemochromatosis Sister   . Thalassemia Sister   . Other Sister        negative for BRCA1 BRCA2  . Breast cancer Cousin        BRCA2 positive  . Diabetes Other   . Breast cancer Other        distant maternal female relatives with breast cancer  . CAD Son        MI  . Heart failure Sister        due to chemo?  . Colon cancer Neg Hx    Social History:  reports that she has never   smoked. She has never used smokeless tobacco. She reports that she does not drink alcohol and does not use drugs.  Allergies:  Allergies  Allergen Reactions  . Iodine Anaphylaxis  . Shellfish Allergy Anaphylaxis  . Sulfonamide Derivatives Rash  . Citrus Hives, Itching and Other (See Comments)    Itching in mouth/ blisters on lips  . Lactose Intolerance (Gi) Diarrhea and Swelling    Swollen mouth  . Amoxicillin Diarrhea    Did it involve swelling of the face/tongue/throat, SOB, or low BP? No Did it involve sudden or severe rash/hives, skin peeling, or any reaction on the inside of your mouth or nose? No Did you need to seek medical attention at a hospital or doctor's office? No When did it last happen?20-71 years old If all above answers are "NO", may proceed with cephalosporin use.    No medications prior to admission.    No results found for this or any previous visit (from the past 48 hour(s)). No results found.  Review of Systems  Constitutional:  Positive for activity change.  HENT: Negative.   Respiratory: Negative.   Cardiovascular: Negative.   Gastrointestinal: Negative.   Genitourinary: Negative.   Musculoskeletal: Positive for back pain and gait problem.  Neurological: Positive for numbness.  Psychiatric/Behavioral: Negative.     There were no vitals taken for this visit. Physical Exam HENT:     Head: Normocephalic.     Nose: Nose normal.  Eyes:     Extraocular Movements: Extraocular movements intact.  Cardiovascular:     Rate and Rhythm: Regular rhythm.  Pulmonary:     Effort: Pulmonary effort is normal. No respiratory distress.  Musculoskeletal:        General: Tenderness present.     Cervical back: Normal range of motion.  Neurological:     Mental Status: She is alert and oriented to person, place, and time.  Psychiatric:        Mood and Affect: Mood normal.      Assessment/Plan L4-5 stenosis/left HNP  We will proceed with L4-5 decompression and possible left L4-5 microdiscectomy as scheduled.  Procedure discussed along potential recovery time.  All questions answered.  Benjiman Core, PA-C 08/08/2020, 2:01 PM

## 2020-08-12 NOTE — Progress Notes (Signed)
Surgical Instructions    Your procedure is scheduled on 08/18/20.  Report to [facility] Main Entrance "A" at 10:30 A.M., then check in with the Admitting office.  Call this number if you have problems the morning of surgery:  [phone]   If you have any questions prior to your surgery date call [phone]: Open Monday-Friday 8am-4pm    Remember:  Do not eat after midnight the night before your surgery  You may drink clear liquids until 09:30am the morning of your surgery.   Clear liquids allowed are: Water, Non-Citrus Juices (without pulp), Carbonated Beverages, Clear Tea, Black Coffee Only, and Gatorade  Patient Instructions   . The day of surgery (if you have diabetes): o Drink ONE (1) 4 oz water bottle by 9:30am. Drink in one sitting. Do not sip. o This drink was given to you during your hospital  pre-op appointment visit.  o Nothing else to drink after drinking the water bottle.          If you have questions, please contact your surgeon's office.     Take these medicines the morning of surgery with A SIP OF WATER  diltiazem (CARDIZEM CD) fluticasone (FLONASE) if needed levalbuterol [facility] HFA) if needed -Bring inhalers with you the day of surgery metoprolol succinate (TOPROL-XL)  omeprazole (PRILOSEC)    As of today, STOP taking any Aspirin (unless otherwise instructed by your surgeon) Aleve, Naproxen, Ibuprofen, Motrin, Advil, Goody's, BC's, all herbal medications, fish oil, and all vitamins.   WHAT DO I DO ABOUT MY DIABETES MEDICATION?   Marland Kitchen Do not take oral diabetes medicines (pills) the morning of surgery.  . THE DAY BEFORE SURGERY,  do not take empagliflozin (JARDIANCE). You can take metFORMIN (GLUCOPHAGE) and pioglitazone (ACTOS) .   Marland Kitchen THE MORNING OF SURGERY, do not take empagliflozin (JARDIANCE), metFORMIN (GLUCOPHAGE) or pioglitazone (ACTOS).  . The day of surgery, do not take other diabetes injectables, including Byetta (exenatide), Bydureon  (exenatide ER), Victoza (liraglutide), or Trulicity (dulaglutide).  . If your CBG is greater than 220 mg/dL, you may take  of your sliding scale (correction) dose of insulin.   HOW TO MANAGE YOUR DIABETES BEFORE AND AFTER SURGERY  Why is it important to control my blood sugar before and after surgery? . Improving blood sugar levels before and after surgery helps healing and can limit problems. . A way of improving blood sugar control is eating a healthy diet by: o  Eating less sugar and carbohydrates o  Increasing activity/exercise o  Talking with your doctor about reaching your blood sugar goals . High blood sugars (greater than 180 mg/dL) can raise your risk of infections and slow your recovery, so you will need to focus on controlling your diabetes during the weeks before surgery. . Make sure that the doctor who takes care of your diabetes knows about your planned surgery including the date and location.  How do I manage my blood sugar before surgery? . Check your blood sugar at least 4 times a day, starting 2 days before surgery, to make sure that the level is not too high or low. . Check your blood sugar the morning of your surgery when you wake up and every 2 hours until you get to the Short Stay unit. o If your blood sugar is less than 70 mg/dL, you will need to treat for low blood sugar: - Do not take insulin. - Treat a low blood sugar (less than 70 mg/dL) with  cup of clear juice (  cranberry or apple), 4 glucose tablets, OR glucose gel. - Recheck blood sugar in 15 minutes after treatment (to make sure it is greater than 70 mg/dL). If your blood sugar is not greater than 70 mg/dL on recheck, call [phone] for further instructions. . Report your blood sugar to the short stay nurse when you get to Short Stay.  . If you are admitted to the hospital after surgery: o Your blood sugar will be checked by the staff and you will probably be given insulin after surgery (instead of oral  diabetes medicines) to make sure you have good blood sugar levels. o The goal for blood sugar control after surgery is 80-180 mg/dL.                      Do not wear jewelry, make up, or nail polish            Do not wear lotions, powders, perfumes/colognes, or deodorant.            Do not shave 48 hours prior to surgery.              Do not bring valuables to the hospital.            [facility] is not responsible for any belongings or valuables.  Do NOT Smoke (Tobacco/Vaping) or drink Alcohol 24 hours prior to your procedure If you use a CPAP at night, you may bring all equipment for your overnight stay.   Contacts, glasses, dentures or bridgework may not be worn into surgery, please bring cases for these belongings   For patients admitted to the hospital, discharge time will be determined by your treatment team.   Patients discharged the day of surgery will not be allowed to drive home, and someone needs to stay with them for 24 hours.    Special instructions:   [facility]- Preparing For Surgery  Before surgery, you can play an important role. Because skin is not sterile, your skin needs to be as free of germs as possible. You can reduce the number of germs on your skin by washing with CHG (chlorahexidine gluconate) Soap before surgery.  CHG is an antiseptic cleaner which kills germs and bonds with the skin to continue killing germs even after washing.    Oral Hygiene is also important to reduce your risk of infection.  Remember - BRUSH YOUR TEETH THE MORNING OF SURGERY WITH YOUR REGULAR TOOTHPASTE  Please do not use if you have an allergy to CHG or antibacterial soaps. If your skin becomes reddened/irritated stop using the CHG.  Do not shave (including legs and underarms) for at least 48 hours prior to first CHG shower. It is OK to shave your face.  Please follow these instructions carefully.   1. Shower the NIGHT BEFORE SURGERY and the MORNING OF SURGERY  2. If you chose to  wash your hair, wash your hair first as usual with your normal shampoo.  3. After you shampoo, rinse your hair and body thoroughly to remove the shampoo.  4. Wash Face and genitals (private parts) with your normal soap.   5.  Shower the NIGHT BEFORE SURGERY and the MORNING OF SURGERY with CHG Soap.   6. Use CHG Soap as you would any other liquid soap. You can apply CHG directly to the skin and wash gently with a scrungie or a clean washcloth.   7. Apply the CHG Soap to your body ONLY FROM THE NECK DOWN.  Do not use on open wounds or open sores. Avoid contact with your eyes, ears, mouth and genitals (private parts). Wash Face and genitals (private parts)  with your normal soap.   8. Wash thoroughly, paying special attention to the area where your surgery will be performed.  9. Thoroughly rinse your body with warm water from the neck down.  10. DO NOT shower/wash with your normal soap after using and rinsing off the CHG Soap.  11. Pat yourself dry with a CLEAN TOWEL.  12. Wear CLEAN PAJAMAS to bed the night before surgery  13. Place CLEAN SHEETS on your bed the night before your surgery  14. DO NOT SLEEP WITH PETS.   Day of Surgery: Take a shower.  Wear Clean/Comfortable clothing the morning of surgery Do not apply any deodorants/lotions.   Remember to brush your teeth WITH YOUR REGULAR TOOTHPASTE.   Please read over the following fact sheets that you were given.

## 2020-08-13 ENCOUNTER — Encounter (HOSPITAL_COMMUNITY)
Admission: RE | Admit: 2020-08-13 | Discharge: 2020-08-13 | Disposition: A | Discharge status: Home / Self Care | Payer: Medicare Other | Source: Ambulatory Visit | Attending: Orthopaedic Surgery | Admitting: Orthopaedic Surgery

## 2020-08-13 ENCOUNTER — Encounter (HOSPITAL_COMMUNITY): Payer: Self-pay

## 2020-08-13 ENCOUNTER — Other Ambulatory Visit: Payer: Self-pay

## 2020-08-13 ENCOUNTER — Encounter (HOSPITAL_COMMUNITY)
Admission: RE | Admit: 2020-08-13 | Discharge: 2020-08-13 | Disposition: A | Discharge status: Home / Self Care | Payer: Medicare Other | Source: Ambulatory Visit | Attending: Surgery | Admitting: Surgery

## 2020-08-13 DIAGNOSIS — Z01812 Encounter for preprocedural laboratory examination: Secondary | ICD-10-CM | POA: Diagnosis not present

## 2020-08-13 DIAGNOSIS — K449 Diaphragmatic hernia without obstruction or gangrene: Secondary | ICD-10-CM | POA: Diagnosis not present

## 2020-08-13 DIAGNOSIS — H1045 Other chronic allergic conjunctivitis: Secondary | ICD-10-CM | POA: Diagnosis not present

## 2020-08-13 DIAGNOSIS — Z01818 Encounter for other preprocedural examination: Secondary | ICD-10-CM

## 2020-08-13 DIAGNOSIS — J3089 Other allergic rhinitis: Secondary | ICD-10-CM | POA: Diagnosis not present

## 2020-08-13 DIAGNOSIS — J301 Allergic rhinitis due to pollen: Secondary | ICD-10-CM | POA: Diagnosis not present

## 2020-08-13 DIAGNOSIS — J3081 Allergic rhinitis due to animal (cat) (dog) hair and dander: Secondary | ICD-10-CM | POA: Diagnosis not present

## 2020-08-13 HISTORY — DX: Cardiac arrhythmia, unspecified: I49.9

## 2020-08-13 HISTORY — DX: Type 2 diabetes mellitus without complications: E11.9

## 2020-08-13 LAB — COMPREHENSIVE METABOLIC PANEL
ALT: 19 U/L (ref 0–44)
AST: 20 U/L (ref 15–41)
Albumin: 3.9 g/dL (ref 3.5–5.0)
Alkaline Phosphatase: 52 U/L (ref 38–126)
Anion gap: 10 (ref 5–15)
BUN: 20 mg/dL (ref 8–23)
CO2: 20 mmol/L — ABNORMAL LOW (ref 22–32)
Calcium: 9.3 mg/dL (ref 8.9–10.3)
Chloride: 107 mmol/L (ref 98–111)
Creatinine, Ser: 1.01 mg/dL — ABNORMAL HIGH (ref 0.44–1.00)
GFR, Estimated: 58 mL/min — ABNORMAL LOW (ref >60)
Glucose, Bld: 166 mg/dL — ABNORMAL HIGH (ref 70–99)
Potassium: 3.8 mmol/L (ref 3.5–5.1)
Sodium: 137 mmol/L (ref 135–145)
Total Bilirubin: 1.1 mg/dL (ref 0.3–1.2)
Total Protein: 6.9 g/dL (ref 6.5–8.1)

## 2020-08-13 LAB — SURGICAL PCR SCREEN
MRSA, PCR: NEGATIVE
Staphylococcus aureus: NEGATIVE

## 2020-08-13 LAB — CBC
HCT: 43.1 % (ref 36.0–46.0)
Hemoglobin: 13.4 g/dL (ref 12.0–15.0)
MCH: 25.5 pg — ABNORMAL LOW (ref 26.0–34.0)
MCHC: 31.1 g/dL (ref 30.0–36.0)
MCV: 81.9 fL (ref 80.0–100.0)
Platelets: 306 10*3/uL (ref 150–400)
RBC: 5.26 MIL/uL — ABNORMAL HIGH (ref 3.87–5.11)
RDW: 16.6 % — ABNORMAL HIGH (ref 11.5–15.5)
WBC: 9.3 10*3/uL (ref 4.0–10.5)
nRBC: 0 % (ref 0.0–0.2)

## 2020-08-13 LAB — GLUCOSE, CAPILLARY: Glucose-Capillary: 152 mg/dL — ABNORMAL HIGH (ref 70–99)

## 2020-08-13 NOTE — Progress Notes (Signed)
PCP - Kristy [name] Cardiologist - Kristy [name]   PPM/ICD - denies   Chest x-ray - n/a EKG - 07/18/20 Stress Test - 07/22/20 ECHO - 07/23/20 Cardiac Cath - denies  Sleep Study - denies   Fasting Blood Sugar - 100-120s Checks Blood Sugar once a day  Patient instructed to hold all Aspirin, NSAID's, herbal medications, fish oil and vitamins 7 days prior to surgery.   ERAS Protcol -tes PRE-SURGERY Ensure or G2- water given  COVID TEST- 08/14/20   Anesthesia review: yes, cardiac history. Followed by Kristy [name] currently (saw Kristy [name] in the past)  Patient denies shortness of breath, fever, cough and chest pain at PAT appointment   All instructions explained to the patient, with a verbal understanding of the material. Patient agrees to go over the instructions while at home for a better understanding. Patient also instructed to self quarantine after being tested for COVID-19. The opportunity to ask questions was provided.

## 2020-08-14 ENCOUNTER — Other Ambulatory Visit (HOSPITAL_COMMUNITY)
Admission: RE | Admit: 2020-08-14 | Discharge: 2020-08-14 | Disposition: A | Discharge status: Home / Self Care | Payer: Medicare Other | Source: Ambulatory Visit | Attending: Orthopaedic Surgery | Admitting: Orthopaedic Surgery

## 2020-08-14 DIAGNOSIS — Z20822 Contact with and (suspected) exposure to covid-19: Secondary | ICD-10-CM | POA: Diagnosis not present

## 2020-08-14 DIAGNOSIS — Z01812 Encounter for preprocedural laboratory examination: Secondary | ICD-10-CM | POA: Diagnosis not present

## 2020-08-14 LAB — SARS CORONAVIRUS 2 (TAT 6-24 HRS): SARS Coronavirus 2: NEGATIVE

## 2020-08-14 NOTE — Anesthesia Preprocedure Evaluation (Addendum)
Anesthesia Evaluation  Patient identified by MRN, date of birth, ID band Patient awake    Reviewed: Allergy & Precautions, NPO status , Patient's Chart, lab work & pertinent test results  Airway Mallampati: II  TM Distance: >3 FB Neck ROM: Full    Dental  (+) Teeth Intact, Dental Advisory Given   Pulmonary    breath sounds clear to auscultation       Cardiovascular hypertension,  Rhythm:Regular Rate:Normal     Neuro/Psych    GI/Hepatic   Endo/Other  diabetes  Renal/GU      Musculoskeletal   Abdominal (+) + obese,   Peds  Hematology   Anesthesia Other Findings   Reproductive/Obstetrics                            Anesthesia Physical Anesthesia Plan  ASA: III  Anesthesia Plan: General   Post-op Pain Management:    Induction: Intravenous  PONV Risk Score and Plan: Ondansetron and Dexamethasone  Airway Management Planned: Oral ETT  Additional Equipment:   Intra-op Plan:   Post-operative Plan:   Informed Consent: I have reviewed the patients History and Physical, chart, labs and discussed the procedure including the risks, benefits and alternatives for the proposed anesthesia with the patient or authorized representative who has indicated his/her understanding and acceptance.     Dental advisory given  Plan Discussed with: CRNA and Anesthesiologist  Anesthesia Plan Comments: (PAT note by Karoline Caldwell, PA-C: Patient has a history of TIA and SVT and was recently referred to cardiology for preop eval by her PCP Dr. Larose Kells.  She was seen by Dr. Gardiner Rhyme 07/18/2020.  Per note, "prior to spinal surgery.  She reports dyspnea with minimal exertion.  Functional capacity less than 4 METS.  RCRI score 1 given CVA history.  Given her symptoms, recommend Lexiscan Myoview and echocardiogram prior to surgery."  She subsequently underwent both tests Dr. Gardiner Rhyme commented on result stating, "no  significant abnormalities.  Normal heart pumping function, suggesting abnormality on stress test was likely artifact.  No further work-up recommended prior to her surgery."  DM2 on oral meds, well controlled, last A1c 6.8 on 07/11/2020.  Preop labs reviewed, unremarkable.  EKG 07/18/2020: Normal sinus rhythm with sinus arrhythmia.  Rate 75.  ECHO 07/23/2020: IMPRESSIONS 1. Left ventricular ejection fraction, by estimation, is 60 to 65%. The left ventricle has normal function. The left ventricle has no regional wall motion abnormalities. There is mild concentric left ventricular hypertrophy. Left ventricular diastolic parameters are consistent with Grade I diastolic dysfunction (impaired relaxation). 2. Right ventricular systolic function is normal. The right ventricular size is normal. 3. The mitral valve is normal in structure. Trivial mitral valve regurgitation. 4. The aortic valve is tricuspid. Aortic valve regurgitation is not visualized. No aortic stenosis is present. 5. The inferior vena cava is normal in size with greater than 50% respiratory variability, suggesting right atrial pressure of 3 mmHg.  Nuclear stress 07/22/2020: Narrative  Nuclear stress EF: 49%.  The left ventricular ejection fraction is mildly decreased (45-54%).  No T wave inversion was noted during stress.  There was no ST segment deviation noted during stress.  Defect 1: There is a medium defect of moderate severity present in the basal anteroseptal and mid anteroseptal location.  This is a low risk study.  Medium size, moderate intensity fixed basal to mid anteroseptal perfusion defect, artfact is favored or less likely scar. LVEF 49% with anteroseptal hypokinesis. This is  a low risk study. Echo correlation is advised.  Carotid ultrasound 08/18/2014: Impression: This study is negative for hemodynamically significant stenosis involving extracranial carotid arteries bilaterally.  Mild focal plaques were  observed in the right and left bulbs.  )       Anesthesia Quick Evaluation

## 2020-08-14 NOTE — Progress Notes (Signed)
Anesthesia Chart Review:  Patient has a history of TIA and SVT and was recently referred to cardiology for preop eval by her PCP Dr. Larose Kells.  She was seen by Dr. Gardiner Rhyme 07/18/2020.  Per note, "prior to spinal surgery.  She reports dyspnea with minimal exertion.  Functional capacity less than 4 METS.  RCRI score 1 given CVA history.  Given her symptoms, recommend Lexiscan Myoview and echocardiogram prior to surgery."  She subsequently underwent both tests Dr. Gardiner Rhyme commented on result stating, "no significant abnormalities.  Normal heart pumping function, suggesting abnormality on stress test was likely artifact.  No further work-up recommended prior to her surgery."  DM2 on oral meds, well controlled, last A1c 6.8 on 07/11/2020.  Preop labs reviewed, unremarkable.  EKG 07/18/2020: Normal sinus rhythm with sinus arrhythmia.  Rate 75.  ECHO 07/23/2020: IMPRESSIONS 1. Left ventricular ejection fraction, by estimation, is 60 to 65%. The left ventricle has normal function. The left ventricle has no regional wall motion abnormalities. There is mild concentric left ventricular hypertrophy. Left ventricular diastolic parameters are consistent with Grade I diastolic dysfunction (impaired relaxation). 2. Right ventricular systolic function is normal. The right ventricular size is normal. 3. The mitral valve is normal in structure. Trivial mitral valve regurgitation. 4. The aortic valve is tricuspid. Aortic valve regurgitation is not visualized. No aortic stenosis is present. 5. The inferior vena cava is normal in size with greater than 50% respiratory variability, suggesting right atrial pressure of 3 mmHg.  Nuclear stress 07/22/2020: Narrative  Nuclear stress EF: 49%.  The left ventricular ejection fraction is mildly decreased (45-54%).  No T wave inversion was noted during stress.  There was no ST segment deviation noted during stress.  Defect 1: There is a medium defect of moderate severity  present in the basal anteroseptal and mid anteroseptal location.  This is a low risk study.  Medium size, moderate intensity fixed basal to mid anteroseptal perfusion defect, artfact is favored or less likely scar. LVEF 49% with anteroseptal hypokinesis. This is a low risk study. Echo correlation is advised.  Carotid ultrasound 08/18/2014: Impression: This study is negative for hemodynamically significant stenosis involving extracranial carotid arteries bilaterally.  Mild focal plaques were observed in the right and left bulbs.   [name] [facility] Short Stay Center/Anesthesiology Phone [phone] 08/14/2020 2:02 PM

## 2020-08-18 ENCOUNTER — Encounter (HOSPITAL_COMMUNITY): Payer: Self-pay | Admitting: Orthopaedic Surgery

## 2020-08-18 ENCOUNTER — Ambulatory Visit (HOSPITAL_COMMUNITY): Payer: Medicare Other | Admitting: Physician Assistant

## 2020-08-18 ENCOUNTER — Ambulatory Visit (HOSPITAL_COMMUNITY): Payer: Medicare Other | Admitting: Anesthesiology

## 2020-08-18 ENCOUNTER — Observation Stay (HOSPITAL_COMMUNITY)
Admission: RE | Admit: 2020-08-18 | Discharge: 2020-08-19 | Disposition: A | Discharge status: Home / Self Care | Payer: Medicare Other | Attending: Orthopaedic Surgery | Admitting: Orthopaedic Surgery

## 2020-08-18 ENCOUNTER — Other Ambulatory Visit: Payer: Self-pay

## 2020-08-18 ENCOUNTER — Encounter (HOSPITAL_COMMUNITY)
Admission: RE | Disposition: A | Discharge status: Home / Self Care | Payer: Self-pay | Source: Home / Self Care | Attending: Orthopaedic Surgery

## 2020-08-18 ENCOUNTER — Ambulatory Visit (HOSPITAL_COMMUNITY): Payer: Medicare Other

## 2020-08-18 DIAGNOSIS — Z7984 Long term (current) use of oral hypoglycemic drugs: Secondary | ICD-10-CM | POA: Insufficient documentation

## 2020-08-18 DIAGNOSIS — Z96653 Presence of artificial knee joint, bilateral: Secondary | ICD-10-CM | POA: Diagnosis not present

## 2020-08-18 DIAGNOSIS — M48061 Spinal stenosis, lumbar region without neurogenic claudication: Secondary | ICD-10-CM | POA: Diagnosis not present

## 2020-08-18 DIAGNOSIS — Z7982 Long term (current) use of aspirin: Secondary | ICD-10-CM | POA: Insufficient documentation

## 2020-08-18 DIAGNOSIS — Z8673 Personal history of transient ischemic attack (TIA), and cerebral infarction without residual deficits: Secondary | ICD-10-CM | POA: Diagnosis not present

## 2020-08-18 DIAGNOSIS — Z91010 Allergy to peanuts: Secondary | ICD-10-CM | POA: Insufficient documentation

## 2020-08-18 DIAGNOSIS — J4530 Mild persistent asthma, uncomplicated: Secondary | ICD-10-CM | POA: Diagnosis not present

## 2020-08-18 DIAGNOSIS — M5126 Other intervertebral disc displacement, lumbar region: Secondary | ICD-10-CM

## 2020-08-18 DIAGNOSIS — I10 Essential (primary) hypertension: Secondary | ICD-10-CM | POA: Insufficient documentation

## 2020-08-18 DIAGNOSIS — M48062 Spinal stenosis, lumbar region with neurogenic claudication: Secondary | ICD-10-CM

## 2020-08-18 DIAGNOSIS — M4326 Fusion of spine, lumbar region: Secondary | ICD-10-CM | POA: Diagnosis not present

## 2020-08-18 DIAGNOSIS — M5116 Intervertebral disc disorders with radiculopathy, lumbar region: Secondary | ICD-10-CM | POA: Diagnosis not present

## 2020-08-18 DIAGNOSIS — E119 Type 2 diabetes mellitus without complications: Secondary | ICD-10-CM | POA: Diagnosis not present

## 2020-08-18 DIAGNOSIS — Z79899 Other long term (current) drug therapy: Secondary | ICD-10-CM | POA: Diagnosis not present

## 2020-08-18 DIAGNOSIS — Z419 Encounter for procedure for purposes other than remedying health state, unspecified: Secondary | ICD-10-CM

## 2020-08-18 DIAGNOSIS — Z981 Arthrodesis status: Secondary | ICD-10-CM | POA: Diagnosis not present

## 2020-08-18 DIAGNOSIS — J45909 Unspecified asthma, uncomplicated: Secondary | ICD-10-CM | POA: Insufficient documentation

## 2020-08-18 HISTORY — PX: LUMBAR LAMINECTOMY/DECOMPRESSION MICRODISCECTOMY: SHX5026

## 2020-08-18 LAB — GLUCOSE, CAPILLARY
Glucose-Capillary: 133 mg/dL — ABNORMAL HIGH (ref 70–99)
Glucose-Capillary: 112 mg/dL — ABNORMAL HIGH (ref 70–99)
Glucose-Capillary: 170 mg/dL — ABNORMAL HIGH (ref 70–99)
Glucose-Capillary: 166 mg/dL — ABNORMAL HIGH (ref 70–99)

## 2020-08-18 SURGERY — LUMBAR LAMINECTOMY/DECOMPRESSION MICRODISCECTOMY
Anesthesia: General

## 2020-08-18 MED ORDER — ONDANSETRON HCL 4 MG/2ML IJ SOLN
INTRAMUSCULAR | Status: DC | PRN
  Administered 2020-08-18: 4 mg via INTRAVENOUS

## 2020-08-18 MED ORDER — BUDESONIDE 0.25 MG/2ML IN SUSP
0.2500 mg | Freq: Two times a day (BID) | RESPIRATORY_TRACT | Status: DC
  Administered 2020-08-18: 0.25 mg via RESPIRATORY_TRACT
  Filled 2020-08-18 (×3): qty 2

## 2020-08-18 MED ORDER — OXYCODONE-ACETAMINOPHEN 5-325 MG PO TABS: 1.0000 | ORAL_TABLET | Freq: Four times a day (QID) | ORAL | 0 refills | Status: DC | PRN

## 2020-08-18 MED ORDER — CEFAZOLIN SODIUM-DEXTROSE 2-4 GM/100ML-% IV SOLN
INTRAVENOUS | Status: AC
  Filled 2020-08-18: qty 100

## 2020-08-18 MED ORDER — PROPOFOL 10 MG/ML IV BOLUS
INTRAVENOUS | Status: DC | PRN
  Administered 2020-08-18: 150 mg via INTRAVENOUS

## 2020-08-18 MED ORDER — ACETAMINOPHEN 650 MG RE SUPP
650.0000 mg | RECTAL | Status: DC | PRN
Start: 2020-08-18 — End: 2020-08-19

## 2020-08-18 MED ORDER — ACETAMINOPHEN 325 MG PO TABS
650.0000 mg | ORAL_TABLET | ORAL | Status: DC | PRN
Start: 2020-08-18 — End: 2020-08-19

## 2020-08-18 MED ORDER — BUPIVACAINE HCL (PF) 0.25 % IJ SOLN
INTRAMUSCULAR | Status: DC | PRN
  Administered 2020-08-18: 10 mL

## 2020-08-18 MED ORDER — OXYCODONE HCL 5 MG PO TABS
5.0000 mg | ORAL_TABLET | Freq: Once | ORAL | Status: AC | PRN
  Administered 2020-08-18: 5 mg via ORAL

## 2020-08-18 MED ORDER — METFORMIN HCL 850 MG PO TABS
850.0000 mg | ORAL_TABLET | Freq: Three times a day (TID) | ORAL | Status: DC
  Administered 2020-08-18: 850 mg via ORAL
  Filled 2020-08-18 (×4): qty 1

## 2020-08-18 MED ORDER — DOCUSATE SODIUM 100 MG PO CAPS
100.0000 mg | ORAL_CAPSULE | Freq: Two times a day (BID) | ORAL | Status: DC
  Administered 2020-08-18: 100 mg via ORAL
  Filled 2020-08-18: qty 1

## 2020-08-18 MED ORDER — EMPAGLIFLOZIN 10 MG PO TABS
10.0000 mg | ORAL_TABLET | Freq: Every day | ORAL | Status: DC
  Filled 2020-08-18: qty 1

## 2020-08-18 MED ORDER — OXYCODONE HCL 5 MG PO TABS
ORAL_TABLET | ORAL | Status: AC
  Filled 2020-08-18: qty 1

## 2020-08-18 MED ORDER — FEBUXOSTAT 40 MG PO TABS
40.0000 mg | ORAL_TABLET | Freq: Every evening | ORAL | Status: DC
  Administered 2020-08-19: 40 mg via ORAL
  Filled 2020-08-18 (×3): qty 1

## 2020-08-18 MED ORDER — CHLORHEXIDINE GLUCONATE 0.12 % MT SOLN
OROMUCOSAL | Status: AC
  Administered 2020-08-18: 15 mL via OROMUCOSAL
  Filled 2020-08-18: qty 15

## 2020-08-18 MED ORDER — VITAMIN D3 25 MCG (1000 UNIT) PO TABS
2000.0000 [IU] | ORAL_TABLET | Freq: Every day | ORAL | Status: DC
  Filled 2020-08-18 (×3): qty 2

## 2020-08-18 MED ORDER — PIOGLITAZONE HCL 30 MG PO TABS
30.0000 mg | ORAL_TABLET | Freq: Every evening | ORAL | Status: DC
  Administered 2020-08-19: 30 mg via ORAL
  Filled 2020-08-18 (×3): qty 1

## 2020-08-18 MED ORDER — HYDROCODONE-ACETAMINOPHEN 5-325 MG PO TABS
1.0000 | ORAL_TABLET | ORAL | Status: DC | PRN
  Administered 2020-08-18 – 2020-08-19 (×3): 2 via ORAL
  Filled 2020-08-18 (×3): qty 2

## 2020-08-18 MED ORDER — PHENOL 1.4 % MT LIQD: 1.0000 | OROMUCOSAL | Status: DC | PRN

## 2020-08-18 MED ORDER — MENTHOL 3 MG MT LOZG: 1.0000 | LOZENGE | OROMUCOSAL | Status: DC | PRN

## 2020-08-18 MED ORDER — SODIUM CHLORIDE 0.9 % IV SOLN: INTRAVENOUS | Status: DC

## 2020-08-18 MED ORDER — BUPIVACAINE HCL (PF) 0.25 % IJ SOLN
INTRAMUSCULAR | Status: AC
  Filled 2020-08-18: qty 30

## 2020-08-18 MED ORDER — HYDROMORPHONE HCL 1 MG/ML IJ SOLN
0.5000 mg | INTRAMUSCULAR | Status: DC | PRN
  Administered 2020-08-18 (×2): 0.5 mg via INTRAVENOUS
  Filled 2020-08-18 (×2): qty 0.5

## 2020-08-18 MED ORDER — LEVOCETIRIZINE DIHYDROCHLORIDE 5 MG PO TABS: 5.0000 mg | ORAL_TABLET | Freq: Every day | ORAL | Status: DC

## 2020-08-18 MED ORDER — ONDANSETRON HCL 4 MG/2ML IJ SOLN
INTRAMUSCULAR | Status: AC
  Filled 2020-08-18: qty 2

## 2020-08-18 MED ORDER — PROPOFOL 10 MG/ML IV BOLUS
INTRAVENOUS | Status: AC
  Filled 2020-08-18: qty 20

## 2020-08-18 MED ORDER — METOPROLOL SUCCINATE ER 50 MG PO TB24: 50.0000 mg | ORAL_TABLET | Freq: Every day | ORAL | Status: DC

## 2020-08-18 MED ORDER — LIDOCAINE 2% (20 MG/ML) 5 ML SYRINGE
INTRAMUSCULAR | Status: AC
  Filled 2020-08-18: qty 5

## 2020-08-18 MED ORDER — EPHEDRINE 5 MG/ML INJ
INTRAVENOUS | Status: AC
  Filled 2020-08-18: qty 10

## 2020-08-18 MED ORDER — LORATADINE 10 MG PO TABS: 10.0000 mg | ORAL_TABLET | Freq: Every day | ORAL | Status: DC

## 2020-08-18 MED ORDER — LACTATED RINGERS IV SOLN: INTRAVENOUS | Status: DC

## 2020-08-18 MED ORDER — OXYCODONE HCL 5 MG/5ML PO SOLN: 5.0000 mg | Freq: Once | ORAL | Status: AC | PRN

## 2020-08-18 MED ORDER — ONDANSETRON HCL 4 MG/2ML IJ SOLN
4.0000 mg | Freq: Four times a day (QID) | INTRAMUSCULAR | Status: DC | PRN
  Administered 2020-08-18: 4 mg via INTRAVENOUS
  Filled 2020-08-18: qty 2

## 2020-08-18 MED ORDER — 0.9 % SODIUM CHLORIDE (POUR BTL) OPTIME
TOPICAL | Status: DC | PRN
  Administered 2020-08-18: 1000 mL

## 2020-08-18 MED ORDER — SUGAMMADEX SODIUM 200 MG/2ML IV SOLN
INTRAVENOUS | Status: DC | PRN
  Administered 2020-08-18: 200 mg via INTRAVENOUS

## 2020-08-18 MED ORDER — PANTOPRAZOLE SODIUM 40 MG PO TBEC: 40.0000 mg | DELAYED_RELEASE_TABLET | Freq: Every day | ORAL | Status: DC

## 2020-08-18 MED ORDER — POLYETHYLENE GLYCOL 3350 17 G PO PACK: 17.0000 g | PACK | Freq: Every day | ORAL | Status: DC | PRN

## 2020-08-18 MED ORDER — LEVALBUTEROL TARTRATE 45 MCG/ACT IN AERO
2.0000 | INHALATION_SPRAY | RESPIRATORY_TRACT | Status: DC | PRN
  Filled 2020-08-18: qty 15

## 2020-08-18 MED ORDER — HYDROXYZINE HCL 50 MG/ML IM SOLN
50.0000 mg | Freq: Four times a day (QID) | INTRAMUSCULAR | Status: DC | PRN
  Administered 2020-08-18: 50 mg via INTRAMUSCULAR
  Filled 2020-08-18: qty 1

## 2020-08-18 MED ORDER — FENTANYL CITRATE (PF) 100 MCG/2ML IJ SOLN: 25.0000 ug | INTRAMUSCULAR | Status: DC | PRN

## 2020-08-18 MED ORDER — SODIUM CHLORIDE 0.9 % IV SOLN: 250.0000 mL | INTRAVENOUS | Status: DC

## 2020-08-18 MED ORDER — ALBUMIN HUMAN 5 % IV SOLN: INTRAVENOUS | Status: DC | PRN

## 2020-08-18 MED ORDER — DEXAMETHASONE SODIUM PHOSPHATE 10 MG/ML IJ SOLN
INTRAMUSCULAR | Status: DC | PRN
  Administered 2020-08-18: 10 mg via INTRAVENOUS

## 2020-08-18 MED ORDER — LIDOCAINE 2% (20 MG/ML) 5 ML SYRINGE
INTRAMUSCULAR | Status: DC | PRN
  Administered 2020-08-18: 60 mg via INTRAVENOUS

## 2020-08-18 MED ORDER — METHOCARBAMOL 500 MG PO TABS: 500.0000 mg | ORAL_TABLET | Freq: Four times a day (QID) | ORAL | 0 refills | Status: DC | PRN

## 2020-08-18 MED ORDER — ORAL CARE MOUTH RINSE: 15.0000 mL | Freq: Once | OROMUCOSAL | Status: AC

## 2020-08-18 MED ORDER — ROCURONIUM BROMIDE 10 MG/ML (PF) SYRINGE
PREFILLED_SYRINGE | INTRAVENOUS | Status: AC
  Filled 2020-08-18: qty 10

## 2020-08-18 MED ORDER — ONDANSETRON HCL 4 MG PO TABS: 4.0000 mg | ORAL_TABLET | Freq: Four times a day (QID) | ORAL | Status: DC | PRN

## 2020-08-18 MED ORDER — FLUTICASONE PROPIONATE 50 MCG/ACT NA SUSP
2.0000 | Freq: Every morning | NASAL | Status: DC
  Filled 2020-08-18: qty 16

## 2020-08-18 MED ORDER — PHENYLEPHRINE 40 MCG/ML (10ML) SYRINGE FOR IV PUSH (FOR BLOOD PRESSURE SUPPORT)
PREFILLED_SYRINGE | INTRAVENOUS | Status: AC
  Filled 2020-08-18: qty 10

## 2020-08-18 MED ORDER — EPHEDRINE SULFATE-NACL 50-0.9 MG/10ML-% IV SOSY
PREFILLED_SYRINGE | INTRAVENOUS | Status: DC | PRN
  Administered 2020-08-18 (×5): 10 mg via INTRAVENOUS

## 2020-08-18 MED ORDER — SODIUM CHLORIDE 0.9% FLUSH: 3.0000 mL | INTRAVENOUS | Status: DC | PRN

## 2020-08-18 MED ORDER — SODIUM CHLORIDE 0.9% FLUSH: 3.0000 mL | Freq: Two times a day (BID) | INTRAVENOUS | Status: DC

## 2020-08-18 MED ORDER — ROCURONIUM BROMIDE 10 MG/ML (PF) SYRINGE
PREFILLED_SYRINGE | INTRAVENOUS | Status: DC | PRN
  Administered 2020-08-18: 10 mg via INTRAVENOUS
  Administered 2020-08-18: 50 mg via INTRAVENOUS

## 2020-08-18 MED ORDER — ATORVASTATIN CALCIUM 10 MG PO TABS
10.0000 mg | ORAL_TABLET | Freq: Every day | ORAL | Status: DC
  Administered 2020-08-18: 10 mg via ORAL
  Filled 2020-08-18: qty 1

## 2020-08-18 MED ORDER — DEXAMETHASONE SODIUM PHOSPHATE 10 MG/ML IJ SOLN
INTRAMUSCULAR | Status: AC
  Filled 2020-08-18: qty 1

## 2020-08-18 MED ORDER — DILTIAZEM HCL ER COATED BEADS 120 MG PO CP24: 240.0000 mg | ORAL_CAPSULE | Freq: Every day | ORAL | Status: DC

## 2020-08-18 MED ORDER — FENTANYL CITRATE (PF) 250 MCG/5ML IJ SOLN
INTRAMUSCULAR | Status: DC | PRN
  Administered 2020-08-18 (×2): 25 ug via INTRAVENOUS
  Administered 2020-08-18: 100 ug via INTRAVENOUS

## 2020-08-18 MED ORDER — CHLORHEXIDINE GLUCONATE 0.12 % MT SOLN: 15.0000 mL | Freq: Once | OROMUCOSAL | Status: AC

## 2020-08-18 MED ORDER — PHENYLEPHRINE HCL-NACL 10-0.9 MG/250ML-% IV SOLN
INTRAVENOUS | Status: DC | PRN
  Administered 2020-08-18: 40 ug/min via INTRAVENOUS

## 2020-08-18 MED ORDER — CEFAZOLIN SODIUM-DEXTROSE 2-4 GM/100ML-% IV SOLN
2.0000 g | INTRAVENOUS | Status: AC
  Administered 2020-08-18: 2 g via INTRAVENOUS

## 2020-08-18 MED ORDER — NORTRIPTYLINE HCL 25 MG PO CAPS
25.0000 mg | ORAL_CAPSULE | Freq: Every day | ORAL | Status: DC
  Administered 2020-08-19: 25 mg via ORAL
  Filled 2020-08-18 (×3): qty 1

## 2020-08-18 MED ORDER — LOSARTAN POTASSIUM 50 MG PO TABS: 25.0000 mg | ORAL_TABLET | Freq: Every day | ORAL | Status: DC

## 2020-08-18 MED ORDER — FENTANYL CITRATE (PF) 250 MCG/5ML IJ SOLN
INTRAMUSCULAR | Status: AC
  Filled 2020-08-18: qty 5

## 2020-08-18 MED ORDER — ONDANSETRON HCL 4 MG/2ML IJ SOLN
4.0000 mg | Freq: Once | INTRAMUSCULAR | Status: AC | PRN
  Administered 2020-08-18: 4 mg via INTRAVENOUS

## 2020-08-18 SURGICAL SUPPLY — 43 items
BUR ROUND FLUTED 4 SOFT TCH (BURR) IMPLANT
CANISTER SUCT 3000ML PPV (MISCELLANEOUS) ×2 IMPLANT
CLSR STERI-STRIP ANTIMIC 1/2X4 (GAUZE/BANDAGES/DRESSINGS) ×2 IMPLANT
COVER SURGICAL LIGHT HANDLE (MISCELLANEOUS) ×2 IMPLANT
COVER WAND RF STERILE (DRAPES) ×2 IMPLANT
DECANTER SPIKE VIAL GLASS SM (MISCELLANEOUS) ×2 IMPLANT
DRAPE HALF SHEET 40X57 (DRAPES) ×4 IMPLANT
DRAPE MICROSCOPE LEICA (MISCELLANEOUS) ×2 IMPLANT
DRAPE SURG 17X23 STRL (DRAPES) ×2 IMPLANT
DRSG MEPILEX BORDER 4X4 (GAUZE/BANDAGES/DRESSINGS) ×2 IMPLANT
DRSG MEPILEX BORDER 4X8 (GAUZE/BANDAGES/DRESSINGS) ×1 IMPLANT
DURAPREP 26ML APPLICATOR (WOUND CARE) ×2 IMPLANT
ELECT REM PT RETURN 9FT ADLT (ELECTROSURGICAL) ×2
ELECTRODE REM PT RTRN 9FT ADLT (ELECTROSURGICAL) ×1 IMPLANT
GLOVE ORTHO TXT STRL SZ7.5 (GLOVE) ×4 IMPLANT
GLOVE SRG 8 PF TXTR STRL LF DI (GLOVE) ×2 IMPLANT
GLOVE SURG UNDER POLY LF SZ8 (GLOVE) ×4
GOWN STRL REUS W/ TWL LRG LVL3 (GOWN DISPOSABLE) ×2 IMPLANT
GOWN STRL REUS W/ TWL XL LVL3 (GOWN DISPOSABLE) ×1 IMPLANT
GOWN STRL REUS W/TWL 2XL LVL3 (GOWN DISPOSABLE) ×2 IMPLANT
GOWN STRL REUS W/TWL LRG LVL3 (GOWN DISPOSABLE) ×4
GOWN STRL REUS W/TWL XL LVL3 (GOWN DISPOSABLE) ×2
KIT BASIN OR (CUSTOM PROCEDURE TRAY) ×2 IMPLANT
KIT TURNOVER KIT B (KITS) ×2 IMPLANT
MANIFOLD NEPTUNE II (INSTRUMENTS) ×2 IMPLANT
NDL HYPO 25GX1X1/2 BEV (NEEDLE) ×1 IMPLANT
NDL SPNL 18GX3.5 QUINCKE PK (NEEDLE) ×1 IMPLANT
NEEDLE HYPO 25GX1X1/2 BEV (NEEDLE) ×2 IMPLANT
NEEDLE SPNL 18GX3.5 QUINCKE PK (NEEDLE) ×2 IMPLANT
NS IRRIG 1000ML POUR BTL (IV SOLUTION) ×2 IMPLANT
PACK LAMINECTOMY ORTHO (CUSTOM PROCEDURE TRAY) ×2 IMPLANT
PAD ARMBOARD 7.5X6 YLW CONV (MISCELLANEOUS) ×4 IMPLANT
PATTIES SURGICAL .5 X.5 (GAUZE/BANDAGES/DRESSINGS) IMPLANT
PATTIES SURGICAL .75X.75 (GAUZE/BANDAGES/DRESSINGS) IMPLANT
SUT VIC AB 0 CT1 27 (SUTURE)
SUT VIC AB 0 CT1 27XBRD ANBCTR (SUTURE) IMPLANT
SUT VIC AB 1 CTX 36 (SUTURE) ×2
SUT VIC AB 1 CTX36XBRD ANBCTR (SUTURE) ×1 IMPLANT
SUT VIC AB 2-0 CT1 27 (SUTURE) ×2
SUT VIC AB 2-0 CT1 TAPERPNT 27 (SUTURE) ×1 IMPLANT
SUT VIC AB 3-0 X1 27 (SUTURE) ×2 IMPLANT
TOWEL GREEN STERILE (TOWEL DISPOSABLE) ×2 IMPLANT
TOWEL GREEN STERILE FF (TOWEL DISPOSABLE) ×2 IMPLANT

## 2020-08-18 NOTE — Anesthesia Postprocedure Evaluation (Signed)
Anesthesia Post Note  Patient: Kristy [name]  Procedure(s) Performed: L4-5 decompression, possible left L4-5 microdiscectomy (N/A )     Patient location during evaluation: PACU Anesthesia Type: General Level of consciousness: awake and alert Pain management: pain level controlled Vital Signs Assessment: post-procedure vital signs reviewed and stable Respiratory status: spontaneous breathing, nonlabored ventilation, respiratory function stable and patient connected to nasal cannula oxygen Cardiovascular status: blood pressure returned to baseline and stable Postop Assessment: no apparent nausea or vomiting Anesthetic complications: no   No complications documented.  Last Vitals:  Vitals:   08/18/20 1540 08/18/20 1607  BP: (!) 123/95 128/77  Pulse: 77 74  Resp: (!) 21 20  Temp:  (!) 36.4 C  SpO2: 97% 97%    Last Pain:  Vitals:   08/18/20 1607  TempSrc: Oral  PainSc:                  [name]

## 2020-08-18 NOTE — Transfer of Care (Signed)
Immediate Anesthesia Transfer of Care Note  Patient: Kristy [name]  Procedure(s) Performed: L4-5 decompression, possible left L4-5 microdiscectomy (N/A )  Patient Location: PACU  Anesthesia Type:General  Level of Consciousness: awake, alert  and oriented  Airway & Oxygen Therapy: Patient Spontanous Breathing  Post-op Assessment: Report given to RN, Post -op Vital signs reviewed and stable and Patient moving all extremities X 4  Post vital signs: Reviewed and stable  Last Vitals:  Vitals Value Taken Time  BP 118/66 08/18/20 1409  Temp 36.4 C 08/18/20 1410  Pulse 85 08/18/20 1414  Resp 14 08/18/20 1414  SpO2 94 % 08/18/20 1414  Vitals shown include unvalidated device data.  Last Pain:  Vitals:   08/18/20 1410  TempSrc:   PainSc: 2       Patients Stated Pain Goal: 2 (71/06/26 9485)  Complications: No complications documented.

## 2020-08-18 NOTE — Op Note (Addendum)
Preop diagnosis: L4-5 central stenosis with large disc protrusion.  Postop diagnosis: Same  Procedure: L4-5 central decompression for lumbar spinal stenosis.  Partial facet resection, left microdiscectomy L4-5.  Surgeon: Lorin Mercy MD  Assistant: Benjiman Core, PA-C medically necessary and present for the entire procedure  Anesthesia General orotracheal.  EBL less than 100 cc.  Procedure: After standard prepping and draping after patient was intubated placed prone on chest rolls careful padding yellow crates underneath the shoulders over the ulnar nerve calf pumpers 1010 drape at the S2 level prepped with ChloraPrep due to the patient's iodine and Betadine allergy area squared with towels clear Vi-Drape was applied and laminectomy sheets and drapes.  Spinal needle was placed removing the inner cannula placed above and below the L4-5 disc base for planned decompression needle was adjusted the second x-ray and then after timeout procedure incision was made in the midline subperiosteal dissection self-retaining retractor McCullough retractor was placed.  Second x-ray with Coker clamps placed for their above and below for decompression confirmed lateral x-ray above below L4-5 disc base.  Bone was marked and then resection was performed from the 2 areas for the bone of been marked.  Lamina was thinned removed with Kerrison rongeurs patties to protect the dura.  Hypertrophic chunks of ligament was removed.  On the left side disc was paracentral very large partially calcified and was adherent to the dura.  Using the operative microscope micro dissection to peel off and separate the dura from the large calcified disc protrusion.  Incision catching between some areas of calcium with 15 blade and then passing micropituitary regular straight pituitary up-biting micropituitary and Epstein curettes were used to deliver chunks of disc from the midline down into the disc space and then remove them.  Continued work until  there has been adequate decompression.  We checked the opposite side over on the right with L4-5 disc with not as prominent.  Foraminal was enlarged both right and left going around underneath the edge of the pedicle on each side to make sure nerve root was free particularly on the left side.  Lateral recesses were completely decompressed no sharp areas of bone.  Some small veins in the epidural space lateral gutter on the left were controlled the bipolar cautery.  Operative field was dry dura was intact nice round tube nerve root was free at its takeoff on the left side.  Irrigation with saline solution standard layered closure #1 Vicryl the deep fascia ~subtenons tissue skin staple closure postop dressing and transferred to care room.

## 2020-08-18 NOTE — Interval H&P Note (Signed)
History and Physical Interval Note:  08/18/2020 12:10 PM  Kristy [name]  has presented today for surgery, with the diagnosis of L4-5 stenosis, left disc protrusion.  The various methods of treatment have been discussed with the patient and family. After consideration of risks, benefits and other options for treatment, the patient has consented to  Procedure(s): L4-5 decompression, possible left L4-5 microdiscectomy (N/A) as a surgical intervention.  The patient's history has been reviewed, patient examined, no change in status, stable for surgery.  I have reviewed the patient's chart and labs.  Questions were answered to the patient's satisfaction.     Kristy [name]

## 2020-08-18 NOTE — Discharge Instructions (Addendum)
Ok to shower 1 days postop.  Do not apply any creams or ointments to incision. Your dressing is waterproof and you can shower and leave it on. If it falls off you can replace it with 4X4 and tape.    No aggressive activity.  No bending, twisting,  squatting or prolonged sitting.  Mostly be in reclined position or lying down.    No driving.

## 2020-08-18 NOTE — Anesthesia Procedure Notes (Signed)
Procedure Name: Intubation Date/Time: 08/18/2020 12:37 PM Performed by: Kyung Rudd, CRNA Pre-anesthesia Checklist: Patient identified, Emergency Drugs available, Suction available and Patient being monitored Patient Re-evaluated:Patient Re-evaluated prior to induction Oxygen Delivery Method: Circle system utilized Preoxygenation: Pre-oxygenation with 100% oxygen Induction Type: IV induction Ventilation: Mask ventilation without difficulty and Oral airway inserted - appropriate to patient size Laryngoscope Size: Mac and 3 Grade View: Grade I Tube type: Oral Tube size: 7.0 mm Number of attempts: 1 Airway Equipment and Method: Stylet Placement Confirmation: ETT inserted through vocal cords under direct vision,  positive ETCO2 and breath sounds checked- equal and bilateral Secured at: 21 cm Tube secured with: Tape Dental Injury: Teeth and Oropharynx as per pre-operative assessment

## 2020-08-19 ENCOUNTER — Encounter (HOSPITAL_COMMUNITY): Payer: Self-pay | Admitting: Orthopaedic Surgery

## 2020-08-19 DIAGNOSIS — I10 Essential (primary) hypertension: Secondary | ICD-10-CM | POA: Diagnosis not present

## 2020-08-19 DIAGNOSIS — J45909 Unspecified asthma, uncomplicated: Secondary | ICD-10-CM | POA: Diagnosis not present

## 2020-08-19 DIAGNOSIS — M5116 Intervertebral disc disorders with radiculopathy, lumbar region: Secondary | ICD-10-CM | POA: Diagnosis not present

## 2020-08-19 DIAGNOSIS — E119 Type 2 diabetes mellitus without complications: Secondary | ICD-10-CM | POA: Diagnosis not present

## 2020-08-19 DIAGNOSIS — Z8673 Personal history of transient ischemic attack (TIA), and cerebral infarction without residual deficits: Secondary | ICD-10-CM | POA: Diagnosis not present

## 2020-08-19 DIAGNOSIS — M48061 Spinal stenosis, lumbar region without neurogenic claudication: Secondary | ICD-10-CM | POA: Diagnosis not present

## 2020-08-19 LAB — GLUCOSE, CAPILLARY: Glucose-Capillary: 111 mg/dL — ABNORMAL HIGH (ref 70–99)

## 2020-08-19 NOTE — Evaluation (Signed)
Occupational Therapy Evaluation Patient Details Name: Kristy [name] MRN: [id number] DOB: [birth year] Today's Date: 08/19/2020    History of Present Illness Pt is a 75 y/o female who presents s/p L4-L5 laminectomy/decompression on 08/18/20. PMH significant for Meniere's Disease, TIA/CVA, stress urinary incontinence, HTN, hiatal hernia, gout, fibromyalgia, DJD, DM type 2, B knee replacements.   Clinical Impression   Pt admitted with the above diagnoses. At at baseline pt is mod I with basic ADLs, uses walker. Pt currently needs up to min A with LB ADLs (vs min guard with AE), min guard with functional transfers and mobility. All education completed regarding back precautions and compensatory strategies during ADLs. Spouse present. Plan is d/c home today. OT signing off.      Follow Up Recommendations  No OT follow up;Supervision - Intermittent    Equipment Recommendations  None recommended by OT    Recommendations for Other Services       Precautions / Restrictions Precautions Precautions: Fall;Back Precaution Booklet Issued: Yes (comment) Precaution Comments: Reviewed handout and pt was cued for precautions during functional mobility. Required Braces or Orthoses: Other Brace (No brace needed order) Restrictions Weight Bearing Restrictions: No      Mobility Bed Mobility Overal bed mobility: Needs Assistance Bed Mobility: Rolling;Sidelying to Sit Rolling: Supervision Sidelying to sit: Supervision     Sit to sidelying: Supervision General bed mobility comments: supervision for safety. extra time and effort    Transfers Overall transfer level: Needs assistance Equipment used: Rolling walker (2 wheeled) Transfers: Sit to/from Stand Sit to Stand: Min guard         General transfer comment: to/from EOB. min guard for safety    Balance Overall balance assessment: Needs assistance Sitting-balance support: Feet supported;No upper extremity supported       Standing  balance support: Bilateral upper extremity supported Standing balance-Leahy Scale: Poor Standing balance comment: reliant on RW                           ADL either performed or assessed with clinical judgement   ADL Overall ADL's : Needs assistance/impaired Eating/Feeding: Set up;Sitting   Grooming: Set up;Sitting   Upper Body Bathing: Set up;Sitting   Lower Body Bathing: Min guard;Minimal assistance;Sit to/from stand;With adaptive equipment   Upper Body Dressing : Set up;Sitting   Lower Body Dressing: Min guard;Minimal assistance;With adaptive equipment;Sit to/from stand   Toilet Transfer: Min guard;Ambulation;Comfort height toilet;Grab bars;RW   Toileting- Water quality scientist and Hygiene: Min guard;Sit to/from stand;Minimal assistance;With adaptive equipment Toileting - Clothing Manipulation Details (indicate cue type and reason): AE vs assist with posterior pericare Tub/ Shower Transfer: Min guard;Tub transfer;Ambulation;Tub bench;Rolling walker   Functional mobility during ADLs: Min guard;Rolling walker General ADL Comments: AE vs assistance with LB ADLs and pericare. Education completed on compensatory strategies for ADLs with back precautions. Spouse present throughout session and included in education     Vision         Perception     Praxis      Pertinent Vitals/Pain Pain Assessment: Faces Faces Pain Scale: Hurts little more Pain Location: Incision site Pain Descriptors / Indicators: Burning;Operative site guarding;Discomfort Pain Intervention(s): Limited activity within patient's tolerance;Monitored during session;Repositioned     Hand Dominance Right   Extremity/Trunk Assessment Upper Extremity Assessment Upper Extremity Assessment: Overall WFL for tasks assessed   Lower Extremity Assessment Lower Extremity Assessment: Defer to PT evaluation   Cervical / Trunk Assessment Cervical / Trunk Assessment: Other exceptions  Cervical / Trunk  Exceptions: s/p surgery   Communication Communication Communication: No difficulties   Cognition Arousal/Alertness: Awake/alert Behavior During Therapy: WFL for tasks assessed/performed Overall Cognitive Status: Within Functional Limits for tasks assessed                                     General Comments       Exercises     Shoulder Instructions      Home Living Family/patient expects to be discharged to:: Private residence Living Arrangements: Spouse/significant other Available Help at Discharge: Family;Available 24 hours/day Type of Home: House Home Access: Ramped entrance     Home Layout: One level     Bathroom Shower/Tub: Tub/shower unit;Walk-in shower   Bathroom Toilet: Handicapped height     Home Equipment: Environmental consultant - 2 wheels;Cane - single point;Tub bench;Wheelchair - manual          Prior Functioning/Environment Level of Independence: Independent with assistive device(s)        Comments: Utilizes RW, husband does housework        OT Problem List: Impaired balance (sitting and/or standing);Decreased knowledge of use of DME or AE;Decreased knowledge of precautions;Pain      OT Treatment/Interventions:      OT Goals(Current goals can be found in the care plan section) Acute Rehab OT Goals Patient Stated Goal: Home today  OT Frequency:     Barriers to D/C:            Co-evaluation              AM-PAC OT "6 Clicks" Daily Activity     Outcome Measure Help from another person eating meals?: None Help from another person taking care of personal grooming?: None Help from another person toileting, which includes using toliet, bedpan, or urinal?: A Little Help from another person bathing (including washing, rinsing, drying)?: A Little Help from another person to put on and taking off regular upper body clothing?: None Help from another person to put on and taking off regular lower body clothing?: A Little 6 Click Score: 21    End of Session Equipment Utilized During Treatment: Rolling walker  Activity Tolerance: Patient tolerated treatment well Patient left: in bed;with call bell/phone within reach;Other (comment) (sitting EOB)  OT Visit Diagnosis: Unsteadiness on feet (R26.81);Pain                Time: 9476-5465 OT Time Calculation (min): 12 min Charges:  OT General Charges $OT Visit: 1 Visit OT Evaluation $OT Eval Low Complexity: [place], OT Acute Rehabilitation Services Pager: [phone] Office: [phone]   Hortencia Pilar 08/19/2020, 1:06 PM

## 2020-08-19 NOTE — Progress Notes (Signed)
   Subjective: 1 Day Post-Op Procedure(s) (LRB): L4-5 decompression, possible left L4-5 microdiscectomy (N/A) Patient reports pain as mild and moderate.    Objective: Vital signs in last 24 hours: Temp:  [97.5 F (36.4 C)-97.7 F (36.5 C)] 97.5 F (36.4 C) (03/22 0739) Pulse Rate:  [74-91] 80 (03/22 0739) Resp:  [13-21] 18 (03/22 0739) BP: (116-154)/(53-95) 116/53 (03/22 0739) SpO2:  [90 %-97 %] 90 % (03/22 0739) Weight:  [91.2 kg] 91.2 kg (03/21 1021)  Intake/Output from previous day: 03/21 0701 - 03/22 0700 In: 1140 [P.O.:240; I.V.:650; IV Piggyback:250] Out: 50 [Blood:50] Intake/Output this shift: No intake/output data recorded.  No results for input(s): HGB in the last 72 hours. No results for input(s): WBC, RBC, HCT, PLT in the last 72 hours. No results for input(s): NA, K, CL, CO2, BUN, CREATININE, GLUCOSE, CALCIUM in the last 72 hours. No results for input(s): LABPT, INR in the last 72 hours.  Neurologically intact DG Lumbar Spine 2-3 Views  Result Date: 08/18/2020 CLINICAL DATA:  Lumbar spine surgery, PLIF EXAM: LUMBAR SPINE - 2-3 VIEW COMPARISON:  Three intraoperative images compared to 05/28/2020 FINDINGS: Five non-rib-bearing lumbar vertebra on prior exam. #1 at 3875 hrs: metallic markers from posterior approach project dorsal to the L3-L4 disc space level and dorsal to the superior endplate of L5 #2 at 6433 hrs: Surgical instruments project dorsal to the L3-L4 and L5-S1 disc space levels. #3 at 1255 hrs: Surgical instruments project dorsal to the L3-L4 and L4-L5 disc space levels. IMPRESSION: Intraoperative lumbar localization images as above. Electronically Signed   By: Lavonia Dana M.D.   On: 08/18/2020 17:51    Assessment/Plan: 1 Day Post-Op Procedure(s) (LRB): L4-5 decompression, possible left L4-5 microdiscectomy (N/A) Up with therapy, discharge home. Office one week.   Kristy [name] 08/19/2020, 7:49 AM

## 2020-08-19 NOTE — Evaluation (Signed)
Physical Therapy Evaluation Patient Details Name: Kristy [name] MRN: [id number] DOB: 1946-05-18 Today's Date: 08/19/2020   History of Present Illness  Pt is a 75 y/o female who presents s/p L4-L5 laminectomy/decompression on 08/18/20. PMH significant for Meniere's Disease, TIA/CVA, stress urinary incontinence, HTN, hiatal hernia, gout, fibromyalgia, DJD, DM type 2, B knee replacements.    Clinical Impression  Pt admitted with above diagnosis. At the time of PT eval, pt was able to demonstrate transfers and ambulation with gross min guard assist to supervision for safety and RW for support. Pt was educated on precautions, appropriate activity progression, and car transfer. Pt currently with functional limitations due to the deficits listed below (see PT Problem List). Pt will benefit from skilled PT to increase their independence and safety with mobility to allow discharge to the venue listed below.      Follow Up Recommendations No PT follow up;Supervision for mobility/OOB    Equipment Recommendations  None recommended by PT    Recommendations for Other Services       Precautions / Restrictions Precautions Precautions: Fall;Back Precaution Booklet Issued: Yes (comment) Precaution Comments: Reviewed handout and pt was cued for precautions during functional mobility. Required Braces or Orthoses: Other Brace (No brace needed order) Restrictions Weight Bearing Restrictions: No      Mobility  Bed Mobility Overal bed mobility: Needs Assistance Bed Mobility: Rolling;Sidelying to Sit;Sit to Sidelying Rolling: Supervision Sidelying to sit: Supervision     Sit to sidelying: Supervision General bed mobility comments: Supervision for safety as pt requiring cues for log roll. Performs at a mod I level but does not initiate log roll independently.    Transfers Overall transfer level: Needs assistance Equipment used: Rolling walker (2 wheeled) Transfers: Sit to/from Stand Sit to Stand:  Min guard         General transfer comment: Light guard for safety as pt powered up to full stand.  Ambulation/Gait Ambulation/Gait assistance: Min guard Gait Distance (Feet): 250 Feet Assistive device: Rolling walker (2 wheeled) Gait Pattern/deviations: Step-through pattern;Decreased stride length;Trunk flexed Gait velocity: Decreased Gait velocity interpretation: <1.31 ft/sec, indicative of household ambulator General Gait Details: Pt ambulating slow but generally steady. No assist required and no unsteadiness noted with RW use.  Stairs            Wheelchair Mobility    Modified Rankin (Stroke Patients Only)       Balance Overall balance assessment: Needs assistance Sitting-balance support: Feet supported;No upper extremity supported       Standing balance support: Bilateral upper extremity supported Standing balance-Leahy Scale: Poor Standing balance comment: reliant on RW                             Pertinent Vitals/Pain Pain Assessment: Faces Faces Pain Scale: Hurts little more Pain Location: Incision site Pain Descriptors / Indicators: Operative site guarding;Discomfort Pain Intervention(s): Limited activity within patient's tolerance;Monitored during session;Repositioned    Home Living Family/patient expects to be discharged to:: Private residence Living Arrangements: Spouse/significant other Available Help at Discharge: Family;Available 24 hours/day Type of Home: House Home Access: Ramped entrance     Home Layout: One level Home Equipment: Walker - 2 wheels;Cane - single point;Tub bench;Wheelchair - manual      Prior Function Level of Independence: Independent with assistive device(s)         Comments: Utilizes RW, husband does housework     Hand Dominance   Dominant Hand: Right  Extremity/Trunk Assessment   Upper Extremity Assessment Upper Extremity Assessment: Defer to OT evaluation    Lower Extremity  Assessment Lower Extremity Assessment: Generalized weakness (Consistent with pre-op diagnosis)    Cervical / Trunk Assessment Cervical / Trunk Assessment: Other exceptions Cervical / Trunk Exceptions: s/p surgery  Communication   Communication: No difficulties  Cognition Arousal/Alertness: Awake/alert Behavior During Therapy: WFL for tasks assessed/performed Overall Cognitive Status: Within Functional Limits for tasks assessed                                        General Comments      Exercises     Assessment/Plan    PT Assessment Patient needs continued PT services  PT Problem List Decreased strength;Decreased activity tolerance;Decreased balance;Decreased mobility;Decreased knowledge of use of DME;Decreased safety awareness;Decreased knowledge of precautions;Pain       PT Treatment Interventions DME instruction;Gait training;Functional mobility training;Therapeutic activities;Therapeutic exercise;Neuromuscular re-education;Patient/family education    PT Goals (Current goals can be found in the Care Plan section)  Acute Rehab PT Goals Patient Stated Goal: Home today PT Goal Formulation: With patient/family Time For Goal Achievement: 08/26/20 Potential to Achieve Goals: Good    Frequency Min 5X/week   Barriers to discharge        Co-evaluation               AM-PAC PT "6 Clicks" Mobility  Outcome Measure Help needed turning from your back to your side while in a flat bed without using bedrails?: None Help needed moving from lying on your back to sitting on the side of a flat bed without using bedrails?: None Help needed moving to and from a bed to a chair (including a wheelchair)?: A Little Help needed standing up from a chair using your arms (e.g., wheelchair or bedside chair)?: A Little Help needed to walk in hospital room?: A Little Help needed climbing 3-5 steps with a railing? : A Little 6 Click Score: 20    End of Session Equipment  Utilized During Treatment: Gait belt Activity Tolerance: Patient tolerated treatment well Patient left: in bed;with call bell/phone within reach;with family/visitor present Nurse Communication: Mobility status PT Visit Diagnosis: Unsteadiness on feet (R26.81);Pain Pain - part of body:  (back)    Time: 3254-9826 PT Time Calculation (min) (ACUTE ONLY): 20 min   Charges:   PT Evaluation $PT Eval Low Complexity: 1 Low          Rolinda Roan, PT, DPT Acute Rehabilitation Services Pager: [phone] Office: [phone]   Thelma Comp 08/19/2020, 9:45 AM

## 2020-08-19 NOTE — Plan of Care (Signed)
Patient alert and oriented, voiding adequately, MAE well with no difficulty. Incision area cdi with no s/s of infection. Patient discharged home per order. Patient and husband stated understanding of discharge instructions given. Patient has an appointment with Dr. Yates 

## 2020-08-26 ENCOUNTER — Encounter: Payer: Self-pay | Admitting: Orthopaedic Surgery

## 2020-08-26 ENCOUNTER — Ambulatory Visit (INDEPENDENT_AMBULATORY_CARE_PROVIDER_SITE_OTHER): Payer: Medicare Other | Admitting: Orthopaedic Surgery

## 2020-08-26 ENCOUNTER — Other Ambulatory Visit: Payer: Self-pay

## 2020-08-26 DIAGNOSIS — Z9889 Other specified postprocedural states: Secondary | ICD-10-CM | POA: Insufficient documentation

## 2020-08-26 MED ORDER — OXYCODONE-ACETAMINOPHEN 5-325 MG PO TABS: 1.0000 | ORAL_TABLET | Freq: Four times a day (QID) | ORAL | 0 refills | Status: DC | PRN

## 2020-08-26 NOTE — Progress Notes (Signed)
Post-Op Visit Note   Patient: Kristy [name]           Date of Birth: 01-Nov-1945           MRN: [id number] Visit Date: 08/26/2020 PCP: Colon Branch, MD   Assessment & Plan: Return 1 week for staple removal.  Chief Complaint:  Chief Complaint  Patient presents with  . Lower Back - Routine Post Op    08/18/2020 L4-5 central decompression, partial facet resection, left microdiscectomy L4-5   Visit Diagnoses:  1. Status post lumbar spine surgery for decompression of spinal cord     Plan: Post lumbar decompression.  She got 28 tablets of Percocet from her pharmacy prescription renewed.  Return 1 week for staple removal.  Continue walking should gradually increase her distance.  She has noticed improvement in her preop claudication symptoms.  Follow-Up Instructions: No follow-ups on file.   Orders:  No orders of the defined types were placed in this encounter.  Meds ordered this encounter  Medications  . oxyCODONE-acetaminophen (PERCOCET/ROXICET) 5-325 MG tablet    Sig: Take 1 tablet by mouth every 6 (six) hours as needed for severe pain.    Dispense:  28 tablet    Refill:  0    Imaging: No results found.  PMFS History: Patient Active Problem List   Diagnosis Date Noted  . Status post lumbar spine surgery for decompression of spinal cord 08/26/2020  . Lumbar disc herniation 05/28/2020  . Spinal stenosis of lumbar region 05/28/2020  . H/O vitamin D deficiency 09/21/2018  . Elevated LFTs 12/25/2015  . Dizzy 07/31/2015  . PCP  notes >>>>>>>>>>>>>>>> 02/11/2015  . Hyperparathyroidism ([place]) 06/07/2014  . Benign paroxysmal positional vertigo 02/19/2014  . FHx: BRCA2 gene positive   . Family history of malignant neoplasm of breast   . Family history of breast cancer 01/10/2014  . Tachycardia 11/19/2013  . Elevated LDL cholesterol level 06/11/2013  . Obesity, Class I, BMI 30.0-34.9 (see actual BMI) 06/11/2013  . Osteopenia 06/06/2013  . Menopause 01/09/2013  . History of  TIA (transient ischemic attack) 07/15/2012  . Annual physical exam 05/06/2011  . SUI (stress urinary incontinence, female)   . Atrophic vaginitis   . GOUT, UNSPECIFIED 04/28/2010  . Diabetes ([place]) 04/28/2010  . Meniere's disease 07/30/2008  . Hearing loss 07/30/2008  . Essential hypertension 08/19/2007  . ALLERGIC RHINITIS 08/19/2007  . ASTHMA 08/19/2007  . ACID REFLUX DISEASE 08/19/2007  . IRRITABLE BOWEL SYNDROME 08/19/2007  . DEGENERATIVE DISC DISEASE 08/19/2007  . FIBROMYALGIA 08/19/2007  . FATIGUE, CHRONIC 08/19/2007   Past Medical History:  Diagnosis Date  . Allergy    Environmental, foods: shellfish, lactose intolerant, citrus, peanuts  . Asthma    mild persistent  . Atrophic vaginitis   . BRCA gene mutation negative 2015   testing done at [facility]  . Diabetes mellitus without complication (HCC)    Type 2  . DJD (degenerative joint disease)    generalized joint pain, including back  . Dysrhythmia    SVT  . Family history of malignant neoplasm of breast   . Fibromyalgia    Dr Amil Amen  . GERD (gastroesophageal reflux disease)   . Gout    Idiopathic, chronic  . Hiatal hernia   . History of kidney stones   . Hypertension   . Palpitations    PSVT on Holter monitoring  . Shortness of breath    occ  . Stroke [facility])    TIA  . SUI (  stress urinary incontinence, female)    (resolved after surgery)  . TIA (transient ischemic attack)    07/2012, see Dr Leonie Man  . Vertigo     Family History  Problem Relation Age of Onset  . Breast cancer Mother 80       deceased 26  . Breast cancer Sister 48       bilateral breast ca @ 25; BRCA2 positive  . Hypertension Sister   . Cancer Father        Brain tumor; path?; deceased 41  . Lymphoma Maternal Aunt        deceased 9s  . Hemochromatosis Sister   . Thalassemia Sister   . Other Sister        negative for BRCA1 BRCA2  . Breast cancer Cousin        BRCA2 positive  . Diabetes Other   . Breast cancer Other         distant maternal female relatives with breast cancer  . CAD Son        MI  . Heart failure Sister        due to chemo?  . Colon cancer Neg Hx     Past Surgical History:  Procedure Laterality Date  . ABDOMINAL HYSTERECTOMY  1994   TAH,BSO-BLADDER NECK SUSPENSION  . BREAST SURGERY     LUMPECTOMY-BENIGN Breast Bx X2  . CARPAL TUNNEL RELEASE Right 2005  . CATARACT EXTRACTION, BILATERAL Bilateral 2016   Cataract   . CHOLECYSTECTOMY, LAPAROSCOPIC  2001  . CYSTOCELE REPAIR  1994  . EYE SURGERY    . JOINT REPLACEMENT     bilatral knees;   Marland Kitchen KNEE ARTHROPLASTY  05/19/2012   Procedure: COMPUTER ASSISTED TOTAL KNEE ARTHROPLASTY;  Surgeon: Marybelle Killings, MD;  Location: [place];  Service: Orthopedics;  Laterality: Left;  Left  Total Knee Arthroplasty  . KNEE ARTHROPLASTY Right 09/03/2013   Procedure: COMPUTER ASSISTED TOTAL KNEE ARTHROPLASTY;  Surgeon: Marybelle Killings, MD;  Location: [place];  Service: Orthopedics;  Laterality: Right;  Right Total Knee Arthroplasty, Computer Assist  . KNEE ARTHROSCOPY     bil  . LUMBAR LAMINECTOMY/DECOMPRESSION MICRODISCECTOMY N/A 08/18/2020   Procedure: L4-5 decompression, possible left L4-5 microdiscectomy;  Surgeon: Marybelle Killings, MD;  Location: [place];  Service: Orthopedics;  Laterality: N/A;  . [place] TIBIAL TENDON  2003  . TONSILLECTOMY    . TRIPLE FUSION LEFT ANKLE Left 2007  . TUBAL LIGATION     Social History   Occupational History  . 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 Smoking status: Never Smoker  . Smokeless tobacco: Never Used  Vaping Use  . Vaping Use: Never used  Substance and Sexual Activity  . Alcohol use: No    Alcohol/week: 0.0 standard drinks  . Drug use: No  . Sexual activity: Not Currently    Birth control/protection: Surgical

## 2020-08-26 NOTE — Discharge Summary (Addendum)
Patient ID: Kristy [name] MRN: [id number] DOB/AGE: 02/08/1946 75 y.o.  Admit date: 08/18/2020 Discharge date: 08/19/2020 Admission Diagnoses:  Active Problems:   Lumbar disc herniation   Spinal stenosis of lumbar region   Discharge Diagnoses:  Active Problems:   Lumbar disc herniation   Spinal stenosis of lumbar region  status post Procedure(s): L4-5 decompression, possible left L4-5 microdiscectomy  Past Medical History:  Diagnosis Date  . Allergy    Environmental, foods: shellfish, lactose intolerant, citrus, peanuts  . Asthma    mild persistent  . Atrophic vaginitis   . BRCA gene mutation negative 2015   testing done at [facility] long  . Diabetes mellitus without complication (HCC)    Type 2  . DJD (degenerative joint disease)    generalized joint pain, including back  . Dysrhythmia    SVT  . Family history of malignant neoplasm of breast   . Fibromyalgia    Dr Amil Amen  . GERD (gastroesophageal reflux disease)   . Gout    Idiopathic, chronic  . Hiatal hernia   . History of kidney stones   . Hypertension   . Palpitations    PSVT on Holter monitoring  . Shortness of breath    occ  . Stroke [facility])    TIA  . SUI (stress urinary incontinence, female)    (resolved after surgery)  . TIA (transient ischemic attack)    07/2012, see Dr Leonie Man  . Vertigo     Surgeries: Procedure(s): L4-5 decompression, possible left L4-5 microdiscectomy on 08/18/2020   Consultants:   Discharged Condition: Improved  Hospital Course: Kristy [name] is an 75 y.o. female who was admitted 08/18/2020 for operative treatment of lumbar stenosis/HNP.  Patient failed conservative treatments (please see the history and physical for the specifics) and had severe unremitting pain that affects sleep, daily activities and work/hobbies. After pre-op clearance, the patient was taken to the operating room on 08/18/2020 and underwent  Procedure(s): L4-5 decompression, possible left L4-5 microdiscectomy.     Patient was given perioperative antibiotics:  Anti-infectives (From admission, onward)   Start     Dose/Rate Route Frequency Ordered Stop   08/18/20 1045  ceFAZolin (ANCEF) IVPB 2g/100 mL premix        2 g 200 mL/hr over 30 Minutes Intravenous On call to O.R. 08/18/20 1034 08/18/20 1257   08/18/20 1043  ceFAZolin (ANCEF) 2-4 GM/100ML-% IVPB       Note to Pharmacy: Baird Lyons  : cabinet override      08/18/20 1043 08/18/20 1334       Patient was given sequential compression devices and early ambulation to prevent DVT.   Patient benefited maximally from hospital stay and there were no complications. At the time of discharge, the patient was urinating/moving their bowels without difficulty, tolerating a regular diet, pain is controlled with oral pain medications and they have been cleared by PT/OT.   Recent vital signs: No data found.   Recent laboratory studies: No results for input(s): WBC, HGB, HCT, PLT, NA, K, CL, CO2, BUN, CREATININE, GLUCOSE, INR, CALCIUM in the last 72 hours.  Invalid input(s): PT, 2   Discharge Medications:   Allergies as of 08/19/2020      Reactions   Iodine Anaphylaxis   Shellfish Allergy Anaphylaxis   Sulfonamide Derivatives Rash   Citrus Hives, Itching, Other (See Comments)   Itching in mouth/ blisters on lips   Lactose Intolerance (gi) Diarrhea, Swelling   Swollen mouth   Amoxicillin Diarrhea  Did it involve swelling of the face/tongue/throat, SOB, or low BP? No Did it involve sudden or severe rash/hives, skin peeling, or any reaction on the inside of your mouth or nose? No Did you need to seek medical attention at a hospital or doctor's office? No When did it last happen?33-55 years old If all above answers are "NO", may proceed with cephalosporin use.      Medication List    TAKE these medications   aspirin EC 81 MG tablet Take 81 mg by mouth daily.   atorvastatin 10 MG tablet Commonly known as: LIPITOR Take 1 tablet (10  mg total) by mouth at bedtime.   CALCIUM 600 PO Take 600 mg by mouth in the morning and at bedtime. (1600 & bedtime)   Cholecalciferol 50 MCG (2000 UT) Caps Take 2,000 Units by mouth at bedtime.   diltiazem 240 MG 24 hr capsule Commonly known as: CARDIZEM CD Take 1 capsule (240 mg total) by mouth daily.   empagliflozin 10 MG Tabs tablet Commonly known as: Jardiance Take 1 tablet (10 mg total) by mouth daily before breakfast.   EPINEPHrine 0.3 mg/0.3 mL Devi Commonly known as: EPI-PEN Inject 0.3 mg into the muscle See admin instructions. Inject 0.3 mg intramuscularly in the leg as needed for severe allergic reaction, call 911 and then inject again in the other leg. Reported on 06/26/2015   febuxostat 40 MG tablet Commonly known as: ULORIC Take 40 mg by mouth every evening.   fluticasone 110 MCG/ACT inhaler Commonly known as: FLOVENT HFA Inhale 2 puffs into the lungs daily.   fluticasone 50 MCG/ACT nasal spray Commonly known as: FLONASE Place 2 sprays into both nostrils in the morning.   levalbuterol 45 MCG/ACT inhaler Commonly known as: XOPENEX HFA Inhale 2 puffs into the lungs every 4 (four) hours as needed for shortness of breath.   levocetirizine 5 MG tablet Commonly known as: XYZAL Take 5 mg by mouth daily at 4 PM.   losartan 25 MG tablet Commonly known as: COZAAR Take 1 tablet (25 mg total) by mouth daily.   metFORMIN 850 MG tablet Commonly known as: GLUCOPHAGE Take 1.5 tablets (1,275 mg total) by mouth 2 (two) times daily with a meal. What changed:   how much to take  when to take this   methocarbamol 500 MG tablet Commonly known as: Robaxin Take 1 tablet (500 mg total) by mouth every 6 (six) hours as needed for muscle spasms.   metoprolol succinate 50 MG 24 hr tablet Commonly known as: TOPROL-XL Take 1 tablet (50 mg total) by mouth daily. Take with or immediately following a meal.   nortriptyline 25 MG capsule Commonly known as: PAMELOR Take 25 mg  by mouth daily at 4 PM.   omeprazole 40 MG capsule Commonly known as: PRILOSEC TAKE 1 CAPSULE BY MOUTH EVERY DAY What changed:   how much to take  when to take this   oxyCODONE-acetaminophen 5-325 MG tablet Commonly known as: PERCOCET/ROXICET Take 1 tablet by mouth every 6 (six) hours as needed for severe pain.   pioglitazone 30 MG tablet Commonly known as: ACTOS Take 1 tablet (30 mg total) by mouth daily. What changed: when to take this   vitamin E 180 MG (400 UNITS) capsule Take 400 Units by mouth daily at 4 PM.       Diagnostic Studies: DG Chest 2 View  Result Date: 08/15/2020 CLINICAL DATA:  75 year old female under postoperative evaluation. EXAM: CHEST - 2 VIEW COMPARISON:  Chest x-ray  02/23/2019. FINDINGS: Lung volumes are low. No consolidative airspace disease. No pleural effusions. No pneumothorax. No pulmonary nodule or mass noted. Pulmonary vasculature is normal. Heart size is normal. Large hiatal hernia. Upper mediastinal contours are within normal limits. IMPRESSION: 1. Low lung volumes without radiographic evidence of acute cardiopulmonary disease. 2. Large hiatal hernia. Electronically Signed   By: Vinnie Langton M.D.   On: 08/15/2020 09:22   DG Lumbar Spine 2-3 Views  Result Date: 08/18/2020 CLINICAL DATA:  Lumbar spine surgery, PLIF EXAM: LUMBAR SPINE - 2-3 VIEW COMPARISON:  Three intraoperative images compared to 05/28/2020 FINDINGS: Five non-rib-bearing lumbar vertebra on prior exam. #1 at 4370 hrs: metallic markers from posterior approach project dorsal to the L3-L4 disc space level and dorsal to the superior endplate of L5 #2 at 0525 hrs: Surgical instruments project dorsal to the L3-L4 and L5-S1 disc space levels. #3 at 1255 hrs: Surgical instruments project dorsal to the L3-L4 and L4-L5 disc space levels. IMPRESSION: Intraoperative lumbar localization images as above. Electronically Signed   By: Lavonia Dana M.D.   On: 08/18/2020 17:51    Discharge  Instructions    Incentive spirometry RT   Complete by: As directed        Follow-up Information    Marybelle Killings, MD. Schedule an appointment as soon as possible for a visit in 1 week(s).   Specialty: Orthopedic Surgery Why: need return office visit one week postop.  call to schedule appointment.  Contact information: [address] [place] [address] [phone]               Discharge Plan:  discharge to home  Disposition:     Signed: Benjiman Core  08/26/2020, 10:24 AM

## 2020-09-01 ENCOUNTER — Other Ambulatory Visit: Payer: Self-pay | Admitting: Internal Medicine

## 2020-09-02 ENCOUNTER — Ambulatory Visit (INDEPENDENT_AMBULATORY_CARE_PROVIDER_SITE_OTHER): Payer: Medicare Other | Admitting: Orthopaedic Surgery

## 2020-09-02 ENCOUNTER — Other Ambulatory Visit: Payer: Self-pay

## 2020-09-02 ENCOUNTER — Encounter: Payer: Self-pay | Admitting: Orthopaedic Surgery

## 2020-09-02 VITALS — BP 122/76 | HR 106 | Ht 65.0 in | Wt 201.0 lb

## 2020-09-02 DIAGNOSIS — Z9889 Other specified postprocedural states: Secondary | ICD-10-CM

## 2020-09-02 NOTE — Progress Notes (Signed)
Post-Op Visit Note   Patient: Kristy [name]           Date of Birth: 03-25-1946           MRN: [id number] Visit Date: 09/02/2020 PCP: Colon Branch, MD   Assessment & Plan: Post L4-5 decompression and left microdiscectomy.  She states she does not have any leg numbness.  She is walking without her cane in the house and takes it when she goes out of the house.  Should gradually increase her walking in the neighborhood and return for likely final visit 4 weeks.  Incision was good staples are removed today.  Chief Complaint:  Chief Complaint  Patient presents with  . Lower Back - Follow-up    08/18/2020 L4-5 decompression, partial facet resection, Left L4-5 microdiscectomy   Visit Diagnoses:  1. Status post lumbar spine surgery for decompression of spinal cord     Plan: ROV 4 wks  Follow-Up Instructions: No follow-ups on file.   Orders:  No orders of the defined types were placed in this encounter.  No orders of the defined types were placed in this encounter.   Imaging: No results found.  PMFS History: Patient Active Problem List   Diagnosis Date Noted  . Status post lumbar spine surgery for decompression of spinal cord 08/26/2020  . Lumbar disc herniation 05/28/2020  . Spinal stenosis of lumbar region 05/28/2020  . H/O vitamin D deficiency 09/21/2018  . Elevated LFTs 12/25/2015  . Dizzy 07/31/2015  . PCP  notes >>>>>>>>>>>>>>>> 02/11/2015  . Hyperparathyroidism ([place]) 06/07/2014  . Benign paroxysmal positional vertigo 02/19/2014  . FHx: BRCA2 gene positive   . Family history of malignant neoplasm of breast   . Family history of breast cancer 01/10/2014  . Tachycardia 11/19/2013  . Elevated LDL cholesterol level 06/11/2013  . Obesity, Class I, BMI 30.0-34.9 (see actual BMI) 06/11/2013  . Osteopenia 06/06/2013  . Menopause 01/09/2013  . History of TIA (transient ischemic attack) 07/15/2012  . Annual physical exam 05/06/2011  . SUI (stress urinary incontinence,  female)   . Atrophic vaginitis   . GOUT, UNSPECIFIED 04/28/2010  . Diabetes ([place]) 04/28/2010  . Meniere's disease 07/30/2008  . Hearing loss 07/30/2008  . Essential hypertension 08/19/2007  . ALLERGIC RHINITIS 08/19/2007  . ASTHMA 08/19/2007  . ACID REFLUX DISEASE 08/19/2007  . IRRITABLE BOWEL SYNDROME 08/19/2007  . DEGENERATIVE DISC DISEASE 08/19/2007  . FIBROMYALGIA 08/19/2007  . FATIGUE, CHRONIC 08/19/2007   Past Medical History:  Diagnosis Date  . Allergy    Environmental, foods: shellfish, lactose intolerant, citrus, peanuts  . Asthma    mild persistent  . Atrophic vaginitis   . BRCA gene mutation negative 2015   testing done at [facility]  . Diabetes mellitus without complication (HCC)    Type 2  . DJD (degenerative joint disease)    generalized joint pain, including back  . Dysrhythmia    SVT  . Family history of malignant neoplasm of breast   . Fibromyalgia    Dr Amil Amen  . GERD (gastroesophageal reflux disease)   . Gout    Idiopathic, chronic  . Hiatal hernia   . History of kidney stones   . Hypertension   . Palpitations    PSVT on Holter monitoring  . Shortness of breath    occ  . Stroke [facility])    TIA  . SUI (stress urinary incontinence, female)    (resolved after surgery)  . TIA (transient ischemic attack)  07/2012, see Dr Leonie Man  . Vertigo     Family History  Problem Relation Age of Onset  . Breast cancer Mother 49       deceased 33  . Breast cancer Sister 39       bilateral breast ca @ 61; BRCA2 positive  . Hypertension Sister   . Cancer Father        Brain tumor; path?; deceased 19  . Lymphoma Maternal Aunt        deceased 60s  . Hemochromatosis Sister   . Thalassemia Sister   . Other Sister        negative for BRCA1 BRCA2  . Breast cancer Cousin        BRCA2 positive  . Diabetes Other   . Breast cancer Other        distant maternal female relatives with breast cancer  . CAD Son        MI  . Heart failure Sister        due to  chemo?  . Colon cancer Neg Hx     Past Surgical History:  Procedure Laterality Date  . ABDOMINAL HYSTERECTOMY  1994   TAH,BSO-BLADDER NECK SUSPENSION  . BREAST SURGERY     LUMPECTOMY-BENIGN Breast Bx X2  . CARPAL TUNNEL RELEASE Right 2005  . CATARACT EXTRACTION, BILATERAL Bilateral 2016   Cataract   . CHOLECYSTECTOMY, LAPAROSCOPIC  2001  . CYSTOCELE REPAIR  1994  . EYE SURGERY    . JOINT REPLACEMENT     bilatral knees;   Marland Kitchen KNEE ARTHROPLASTY  05/19/2012   Procedure: COMPUTER ASSISTED TOTAL KNEE ARTHROPLASTY;  Surgeon: Marybelle Killings, MD;  Location: [facility];  Service: Orthopedics;  Laterality: Left;  Left  Total Knee Arthroplasty  . KNEE ARTHROPLASTY Right 09/03/2013   Procedure: COMPUTER ASSISTED TOTAL KNEE ARTHROPLASTY;  Surgeon: Marybelle Killings, MD;  Location: [place];  Service: Orthopedics;  Laterality: Right;  Right Total Knee Arthroplasty, Computer Assist  . KNEE ARTHROSCOPY     bil  . LUMBAR LAMINECTOMY/DECOMPRESSION MICRODISCECTOMY N/A 08/18/2020   Procedure: L4-5 decompression, possible left L4-5 microdiscectomy;  Surgeon: Marybelle Killings, MD;  Location: [facility];  Service: Orthopedics;  Laterality: N/A;  . [place] TIBIAL TENDON  2003  . TONSILLECTOMY    . TRIPLE FUSION LEFT ANKLE Left 2007  . TUBAL LIGATION     Social History   Occupational History  . 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 Smoking status: Never Smoker  . Smokeless tobacco: Never Used  Vaping Use  . Vaping Use: Never used  Substance and Sexual Activity  . Alcohol use: No    Alcohol/week: 0.0 standard drinks  . Drug use: No  . Sexual activity: Not Currently    Birth control/protection: Surgical

## 2020-09-25 ENCOUNTER — Ambulatory Visit (INDEPENDENT_AMBULATORY_CARE_PROVIDER_SITE_OTHER): Payer: Medicare Other

## 2020-09-25 ENCOUNTER — Other Ambulatory Visit: Payer: Self-pay

## 2020-09-25 VITALS — BP 118/86 | HR 81 | Temp 97.3°F | Resp 16 | Ht 65.0 in | Wt 199.0 lb

## 2020-09-25 DIAGNOSIS — Z0000 Encounter for general adult medical examination without abnormal findings: Secondary | ICD-10-CM

## 2020-09-25 NOTE — Patient Instructions (Signed)
Kristy [name] , Thank you for taking time to come for your Medicare Wellness Visit. I appreciate your ongoing commitment to your health goals. Please review the following plan we discussed and let me know if I can assist you in the future.   Screening recommendations/referrals: Colonoscopy: Completed 11/28/2015-Due 11/27/2020 Mammogram: Completed 03/14/2020-Due 03/14/2021 Bone Density: Completed 01/02/2019-Due 01/01/2021 Recommended yearly ophthalmology/optometry visit for glaucoma screening and checkup Recommended yearly dental visit for hygiene and checkup  Vaccinations: Influenza vaccine: Up to date Pneumococcal vaccine: Completed vaccines Tdap vaccine: Up to date-Due-05/04/2021 Shingles vaccine: Discuss with pharmacy Covid-19:Up to date  Advanced directives: Please bring a copy for your chart  Conditions/risks identified: See problem list  Next appointment: Follow up in one year for your annual wellness visit 10/01/20 @ 2:00   Preventive Care 76 Years and Older, Female Preventive care refers to lifestyle choices and visits with your health care provider that can promote health and wellness. What does preventive care include?  A yearly physical exam. This is also called an annual well check.  Dental exams once or twice a year.  Routine eye exams. Ask your health care provider how often you should have your eyes checked.  Personal lifestyle choices, including:  Daily care of your teeth and gums.  Regular physical activity.  Eating a healthy diet.  Avoiding tobacco and drug use.  Limiting alcohol use.  Practicing safe sex.  Taking low-dose aspirin every day.  Taking vitamin and mineral supplements as recommended by your health care provider. What happens during an annual well check? The services and screenings done by your health care provider during your annual well check will depend on your age, overall health, lifestyle risk factors, and family history of  disease. Counseling  Your health care provider may ask you questions about your:  Alcohol use.  Tobacco use.  Drug use.  Emotional well-being.  Home and relationship well-being.  Sexual activity.  Eating habits.  History of falls.  Memory and ability to understand (cognition).  Work and work Statistician.  Reproductive health. Screening  You may have the following tests or measurements:  Height, weight, and BMI.  Blood pressure.  Lipid and cholesterol levels. These may be checked every 5 years, or more frequently if you are over 59 years old.  Skin check.  Lung cancer screening. You may have this screening every year starting at age 36 if you have a 30-pack-year history of smoking and currently smoke or have quit within the past 15 years.  Fecal occult blood test (FOBT) of the stool. You may have this test every year starting at age 23.  Flexible sigmoidoscopy or colonoscopy. You may have a sigmoidoscopy every 5 years or a colonoscopy every 10 years starting at age 76.  Hepatitis C blood test.  Hepatitis B blood test.  Sexually transmitted disease (STD) testing.  Diabetes screening. This is done by checking your blood sugar (glucose) after you have not eaten for a while (fasting). You may have this done every 1-3 years.  Bone density scan. This is done to screen for osteoporosis. You may have this done starting at age 33.  Mammogram. This may be done every 1-2 years. Talk to your health care provider about how often you should have regular mammograms. Talk with your health care provider about your test results, treatment options, and if necessary, the need for more tests. Vaccines  Your health care provider may recommend certain vaccines, such as:  Influenza vaccine. This is recommended every year.  Tetanus, diphtheria, and acellular pertussis (Tdap, Td) vaccine. You may need a Td booster every 10 years.  Zoster vaccine. You may need this after age  4.  Pneumococcal 13-valent conjugate (PCV13) vaccine. One dose is recommended after age 106.  Pneumococcal polysaccharide (PPSV23) vaccine. One dose is recommended after age 30. Talk to your health care provider about which screenings and vaccines you need and how often you need them. This information is not intended to replace advice given to you by your health care provider. Make sure you discuss any questions you have with your health care provider. Document Released: 06/13/2015 Document Revised: 02/04/2016 Document Reviewed: 03/18/2015 Elsevier Interactive Patient Education  2017 [employer] Prevention in the Home Falls can cause injuries. They can happen to people of all ages. There are many things you can do to make your home safe and to help prevent falls. What can I do on the outside of my home?  Regularly fix the edges of walkways and driveways and fix any cracks.  Remove anything that might make you trip as you walk through a door, such as a raised step or threshold.  Trim any bushes or trees on the path to your home.  Use bright outdoor lighting.  Clear any walking paths of anything that might make someone trip, such as rocks or tools.  Regularly check to see if handrails are loose or broken. Make sure that both sides of any steps have handrails.  Any raised decks and porches should have guardrails on the edges.  Have any leaves, snow, or ice cleared regularly.  Use sand or salt on walking paths during winter.  Clean up any spills in your garage right away. This includes oil or grease spills. What can I do in the bathroom?  Use night lights.  Install grab bars by the toilet and in the tub and shower. Do not use towel bars as grab bars.  Use non-skid mats or decals in the tub or shower.  If you need to sit down in the shower, use a plastic, non-slip stool.  Keep the floor dry. Clean up any water that spills on the floor as soon as it happens.  Remove  soap buildup in the tub or shower regularly.  Attach bath mats securely with double-sided non-slip rug tape.  Do not have throw rugs and other things on the floor that can make you trip. What can I do in the bedroom?  Use night lights.  Make sure that you have a light by your bed that is easy to reach.  Do not use any sheets or blankets that are too big for your bed. They should not hang down onto the floor.  Have a firm chair that has side arms. You can use this for support while you get dressed.  Do not have throw rugs and other things on the floor that can make you trip. What can I do in the kitchen?  Clean up any spills right away.  Avoid walking on wet floors.  Keep items that you use a lot in easy-to-reach places.  If you need to reach something above you, use a strong step stool that has a grab bar.  Keep electrical cords out of the way.  Do not use floor polish or wax that makes floors slippery. If you must use wax, use non-skid floor wax.  Do not have throw rugs and other things on the floor that can make you trip. What can I do  with my stairs?  Do not leave any items on the stairs.  Make sure that there are handrails on both sides of the stairs and use them. Fix handrails that are broken or loose. Make sure that handrails are as long as the stairways.  Check any carpeting to make sure that it is firmly attached to the stairs. Fix any carpet that is loose or worn.  Avoid having throw rugs at the top or bottom of the stairs. If you do have throw rugs, attach them to the floor with carpet tape.  Make sure that you have a light switch at the top of the stairs and the bottom of the stairs. If you do not have them, ask someone to add them for you. What else can I do to help prevent falls?  Wear shoes that:  Do not have high heels.  Have rubber bottoms.  Are comfortable and fit you well.  Are closed at the toe. Do not wear sandals.  If you use a  stepladder:  Make sure that it is fully opened. Do not climb a closed stepladder.  Make sure that both sides of the stepladder are locked into place.  Ask someone to hold it for you, if possible.  Clearly mark and make sure that you can see:  Any grab bars or handrails.  First and last steps.  Where the edge of each step is.  Use tools that help you move around (mobility aids) if they are needed. These include:  Canes.  Walkers.  Scooters.  Crutches.  Turn on the lights when you go into a dark area. Replace any light bulbs as soon as they burn out.  Set up your furniture so you have a clear path. Avoid moving your furniture around.  If any of your floors are uneven, fix them.  If there are any pets around you, be aware of where they are.  Review your medicines with your doctor. Some medicines can make you feel dizzy. This can increase your chance of falling. Ask your doctor what other things that you can do to help prevent falls. This information is not intended to replace advice given to you by your health care provider. Make sure you discuss any questions you have with your health care provider. Document Released: 03/13/2009 Document Revised: 10/23/2015 Document Reviewed: 06/21/2014 Elsevier Interactive Patient Education  2017 [employer].

## 2020-09-25 NOTE — Progress Notes (Signed)
Subjective:   Kristy [name] is a 75 y.o. female who presents for Medicare Annual (Subsequent) preventive examination.  Review of Systems     Cardiac Risk Factors include: advanced age (>5mn, >>25women);diabetes mellitus;dyslipidemia;hypertension;obesity (BMI >30kg/m2)     Objective:    Today's Vitals   09/25/20 1352  BP: 118/86  Pulse: 81  Resp: 16  Temp: (!) 97.3 F (36.3 C)  TempSrc: Temporal  SpO2: 98%  Weight: 199 lb (90.3 kg)  Height: '5\' 5"'  (1.651 m)   Body mass index is 33.12 kg/m.  Advanced Directives 09/25/2020 08/18/2020 08/13/2020 02/08/2020 09/24/2019 02/23/2019 09/22/2018  Does Patient Have a Medical Advance Directive? Yes No No No Yes Yes Yes  Type of [place]amed[place] - - - [place] CityLiving will Living will [place]  Does patient want to make changes to medical advance directive? - - - - No - Patient declined No - Patient declined No - Patient declined  Copy of [place] Chart? No - copy requested - - - No - copy requested - No - copy requested  Would patient like information on creating a medical advance directive? - No - Patient declined No - Patient declined - - - -  Pre-existing out of facility DNR order (yellow form or pink MOST form) - - - - - - -    Current Medications (verified) Outpatient Encounter Medications as of 09/25/2020  Medication Sig  . aspirin EC 81 MG tablet Take 81 mg by mouth daily.  .Marland Kitchenatorvastatin (LIPITOR) 10 MG tablet Take 1 tablet (10 mg total) by mouth at bedtime.  . Calcium Carbonate (CALCIUM 600 PO) Take 600 mg by mouth in the morning and at bedtime. (1600 & bedtime)  . Cholecalciferol 50 MCG (2000 UT) CAPS Take 2,000 Units by mouth at bedtime.   .Marland Kitchendiltiazem (CARDIZEM CD) 240 MG 24 hr capsule Take 1 capsule (240 mg total) by mouth daily.  . empagliflozin (JARDIANCE) 10 MG TABS tablet Take 1 tablet (10 mg total) by mouth  daily before breakfast.  . EPINEPHrine (EPI-PEN) 0.3 mg/0.3 mL DEVI Inject 0.3 mg into the muscle See admin instructions. Inject 0.3 mg intramuscularly in the leg as needed for severe allergic reaction, call 911 and then inject again in the other leg. Reported on 06/26/2015  . febuxostat (ULORIC) 40 MG tablet Take 40 mg by mouth every evening.   . fluticasone (FLONASE) 50 MCG/ACT nasal spray Place 2 sprays into both nostrils in the morning.  . fluticasone (FLOVENT HFA) 110 MCG/ACT inhaler Inhale 2 puffs into the lungs daily.  .Marland Kitchenlevalbuterol (XOPENEX HFA) 45 MCG/ACT inhaler Inhale 2 puffs into the lungs every 4 (four) hours as needed for shortness of breath.  . levocetirizine (XYZAL) 5 MG tablet Take 5 mg by mouth daily at 4 PM.  . losartan (COZAAR) 25 MG tablet Take 1 tablet (25 mg total) by mouth daily.  . metFORMIN (GLUCOPHAGE) 850 MG tablet Take 1.5 tablets (1,275 mg total) by mouth 2 (two) times daily with a meal. (Patient taking differently: Take 850 mg by mouth in the morning, at noon, and at bedtime.)  . metoprolol succinate (TOPROL-XL) 50 MG 24 hr tablet Take 1 tablet (50 mg total) by mouth daily. Take with or immediately following a meal.  . nortriptyline (PAMELOR) 25 MG capsule Take 25 mg by mouth daily at 4 PM.  . omeprazole (PRILOSEC) 40 MG capsule TAKE 1 CAPSULE BY  MOUTH EVERY DAY (Patient taking differently: Take 40 mg by mouth daily before breakfast.)  . pioglitazone (ACTOS) 30 MG tablet Take 1 tablet (30 mg total) by mouth daily. (Patient taking differently: Take 30 mg by mouth every evening.)  . vitamin E 400 UNIT capsule Take 400 Units by mouth daily at 4 PM.  . [DISCONTINUED] methocarbamol (ROBAXIN) 500 MG tablet Take 1 tablet (500 mg total) by mouth every 6 (six) hours as needed for muscle spasms. (Patient not taking: Reported on 09/02/2020)  . [DISCONTINUED] oxyCODONE-acetaminophen (PERCOCET/ROXICET) 5-325 MG tablet Take 1 tablet by mouth every 6 (six) hours as needed for severe  pain. (Patient not taking: Reported on 09/02/2020)   No facility-administered encounter medications on file as of 09/25/2020.    Allergies (verified) Iodine, Shellfish allergy, Sulfonamide derivatives, Citrus, Lactose intolerance (gi), and Amoxicillin   History: Past Medical History:  Diagnosis Date  . Allergy    Environmental, foods: shellfish, lactose intolerant, citrus, peanuts  . Asthma    mild persistent  . Atrophic vaginitis   . BRCA gene mutation negative 2015   testing done at [facility]  . Diabetes mellitus without complication (HCC)    Type 2  . DJD (degenerative joint disease)    generalized joint pain, including back  . Dysrhythmia    SVT  . Family history of malignant neoplasm of breast   . Fibromyalgia    Dr Amil Amen  . GERD (gastroesophageal reflux disease)   . Gout    Idiopathic, chronic  . Hiatal hernia   . History of kidney stones   . Hypertension   . Palpitations    PSVT on Holter monitoring  . Shortness of breath    occ  . Stroke [facility])    TIA  . SUI (stress urinary incontinence, female)    (resolved after surgery)  . TIA (transient ischemic attack)    07/2012, see Dr Leonie Man  . Vertigo    Past Surgical History:  Procedure Laterality Date  . ABDOMINAL HYSTERECTOMY  1994   TAH,BSO-BLADDER NECK SUSPENSION  . BREAST SURGERY     LUMPECTOMY-BENIGN Breast Bx X2  . CARPAL TUNNEL RELEASE Right 2005  . CATARACT EXTRACTION, BILATERAL Bilateral 2016   Cataract   . CHOLECYSTECTOMY, LAPAROSCOPIC  2001  . CYSTOCELE REPAIR  1994  . EYE SURGERY    . JOINT REPLACEMENT     bilatral knees;   Marland Kitchen KNEE ARTHROPLASTY  05/19/2012   Procedure: COMPUTER ASSISTED TOTAL KNEE ARTHROPLASTY;  Surgeon: Marybelle Killings, MD;  Location: [place];  Service: Orthopedics;  Laterality: Left;  Left  Total Knee Arthroplasty  . KNEE ARTHROPLASTY Right 09/03/2013   Procedure: COMPUTER ASSISTED TOTAL KNEE ARTHROPLASTY;  Surgeon: Marybelle Killings, MD;  Location: [place];  Service: Orthopedics;   Laterality: Right;  Right Total Knee Arthroplasty, Computer Assist  . KNEE ARTHROSCOPY     bil  . LUMBAR LAMINECTOMY/DECOMPRESSION MICRODISCECTOMY N/A 08/18/2020   Procedure: L4-5 decompression, possible left L4-5 microdiscectomy;  Surgeon: Marybelle Killings, MD;  Location: [place];  Service: Orthopedics;  Laterality: N/A;  . [place] TIBIAL TENDON  2003  . TONSILLECTOMY    . TRIPLE FUSION LEFT ANKLE Left 2007  . TUBAL LIGATION     Family History  Problem Relation Age of Onset  . Breast cancer Mother 40       deceased 69  . Breast cancer Sister 76       bilateral breast ca @  60; BRCA2 positive  . Hypertension Sister   . Cancer Father        Brain tumor; path?; deceased 77  . Lymphoma Maternal Aunt        deceased 24s  . Hemochromatosis Sister   . Thalassemia Sister   . Other Sister        negative for BRCA1 BRCA2  . Breast cancer Cousin        BRCA2 positive  . Diabetes Other   . Breast cancer Other        distant maternal female relatives with breast cancer  . CAD Son        MI  . Heart failure Sister        due to chemo?  . Colon cancer Neg Hx    Social History   Socioeconomic History  . Marital status: Married    Spouse name: [name]  . Number of children: 2  . Years of education: 46  . Highest education level: Not on file  Occupational History  . 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 Smoking status: Never Smoker  . Smokeless tobacco: Never Used  Vaping Use  . Vaping Use: Never used  Substance and Sexual Activity  . Alcohol use: No    Alcohol/week: 0.0 standard drinks  . Drug use: No  . Sexual activity: Not Currently    Birth control/protection: Surgical  Other Topics Concern  . Not on file  Social History Narrative   HSG   1 year college for accounting. married '69. 2 sons - '70, '72: retired - [employer].     ACP - OK for CPR at least once, no long term ventilation, no heroic measures in the  face of poor quality of life. HCPOA - husband, secondary son Kamya Watling (c) [phone]       Social Determinants of Health   Financial Resource Strain: Low Risk   . Difficulty of Paying Living Expenses: Not hard at all  Food Insecurity: No Food Insecurity  . Worried About Charity fundraiser in the Last Year: Never true  . Ran Out of Food in the Last Year: Never true  Transportation Needs: No Transportation Needs  . Lack of Transportation (Medical): No  . Lack of Transportation (Non-Medical): No  Physical Activity: Sufficiently Active  . Days of Exercise per Week: 7 days  . Minutes of Exercise per Session: 30 min  Stress: No Stress Concern Present  . Feeling of Stress : Not at all  Social Connections: Moderately Isolated  . Frequency of Communication with Friends and Family: More than three times a week  . Frequency of Social Gatherings with Friends and Family: More than three times a week  . Attends Religious Services: Never  . Active Member of Clubs or Organizations: No  . Attends Archivist Meetings: Never  . Marital Status: Married    Tobacco Counseling Counseling given: Not Answered   Clinical Intake:  Pre-visit preparation completed: Yes  Pain : No/denies pain     Nutritional Status: BMI > 30  Obese Nutritional Risks: None Diabetes: Yes CBG done?: No Did pt. bring in CBG monitor from home?: No  How often do you need to have someone help you when you read instructions, pamphlets, or other written materials from your doctor or pharmacy?: 1 - Never  Diabetes:  Is the patient diabetic?  Yes  If diabetic, was a CBG obtained today?  Yes  Did  the patient bring in their glucometer from home?  No  How often do you monitor your CBG's? daily.   Financial Strains and Diabetes Management:  Are you having any financial strains with the device, your supplies or your medication? No .  Does the patient want to be seen by Chronic Care Management for management  of their diabetes?  No  Would the patient like to be referred to a Nutritionist or for Diabetic Management?  No   Diabetic Exams:  Diabetic Eye Exam: . Overdue for diabetic eye exam. Pt has been advised about the importance in completing this exam. Patient is waiting for eye doctor to call her back with an appt.  Diabetic Foot Exam: . Pt has been advised about the importance in completing this exam. To be completed by PCP  Interpreter Needed?: No  Information entered by :: Caroleen Hamman LPN   Activities of Daily Living In your present state of health, do you have any difficulty performing the following activities: 09/25/2020 08/18/2020  Hearing? Y -  Comment hearing aids -  Vision? N -  Difficulty concentrating or making decisions? N -  Walking or climbing stairs? N -  Dressing or bathing? N -  Doing errands, shopping? N N  Preparing Food and eating ? N -  Using the Toilet? N -  In the past six months, have you accidently leaked urine? N -  Do you have problems with loss of bowel control? N -  Managing your Medications? N -  Managing your Finances? N -  Housekeeping or managing your Housekeeping? N -  Some recent data might be hidden    Patient Care Team: Colon Branch, MD as PCP - General (Internal Medicine) Donato Heinz, MD as PCP - Cardiology (Cardiology) Hennie Duos, MD (Rheumatology) Lorretta Harp, MD as Consulting Physician (Cardiology) Steele Berg, Counselor (Inactive) (Genetic Counselor) Garvin Fila, MD as Consulting Physician (Neurology) Harold Hedge, Darrick Grinder, MD as Consulting Physician (Allergy and Immunology) Leta Baptist, MD as Consulting Physician (Otolaryngology)  Indicate any recent Medical Services you may have received from other than [facility] providers in the past year (date may be approximate).     Assessment:   This is a routine wellness examination for Meghana.  Hearing/Vision screen  Hearing Screening   '125Hz'  '250Hz'  '500Hz'  '1000Hz'   '2000Hz'  '3000Hz'  '4000Hz'  '6000Hz'  '8000Hz'   Right ear:           Left ear:           Comments: Wears hearing aids  Vision Screening Comments: Last eye exam-07/2019-Dr. Sabra Heck  Dietary issues and exercise activities discussed: Current Exercise Habits: Home exercise routine, Type of exercise: walking, Time (Minutes): 30, Frequency (Times/Week): 7, Weekly Exercise (Minutes/Week): 210, Exercise limited by: None identified  Goals    . Increase physical activity      Depression Screen PHQ 2/9 Scores 09/25/2020 07/11/2020 09/24/2019 09/22/2018 07/10/2018 07/04/2017 03/30/2016  PHQ - 2 Score 0 0 0 0 0 0 0  PHQ- 9 Score - - - - 6 - -    Fall Risk Fall Risk  09/25/2020 07/11/2020 09/24/2019 09/22/2018 07/10/2018  Falls in the past year? '1 1 1 ' 0 1  Comment - - - - -  Number falls in past yr: '1 1 1 ' - 1  Comment - - - - -  Injury with Fall? 0 0 0 - 0  Risk for fall due to : History of fall(s) - Impaired balance/gait - -  Follow up  Falls prevention discussed - Education provided;Falls prevention discussed - -    FALL RISK PREVENTION PERTAINING TO THE HOME:  Any stairs in or around the home? No  Home free of loose throw rugs in walkways, pet beds, electrical cords, etc? Yes  Adequate lighting in your home to reduce risk of falls? Yes   ASSISTIVE DEVICES UTILIZED TO PREVENT FALLS:  Life alert? No  Use of a cane, walker or w/c? Yes  Grab bars in the bathroom? Yes  Shower chair or bench in shower? No  Elevated toilet seat or a handicapped toilet? No   TIMED UP AND GO:  Was the test performed? Yes .  Length of time to ambulate 10 feet: 11 sec.   Gait slow and steady with assistive device  Cognitive Function:Normal cognitive status assessed by direct observation by this Nurse Health Advisor. No abnormalities found.       6CIT Screen 09/25/2020  What Year? 0 points  What month? 0 points  What time? 0 points  Count back from 20 0 points  Months in reverse 0 points  Repeat phrase 0 points  Total  Score 0    Immunizations Immunization History  Administered Date(s) Administered  . Fluad Quad(high Dose 65+) 02/16/2019, 03/19/2020  . Influenza Whole 02/28/2009, 02/20/2010, 04/26/2012  . Influenza, High Dose Seasonal PF 02/11/2015, 03/30/2016, 03/28/2017, 03/09/2018  . Influenza,inj,Quad PF,6+ Mos 02/19/2014  . Influenza-Unspecified 04/20/2013  . Moderna Sars-Covid-2 Vaccination 07/13/2019, 08/10/2019, 03/29/2020  . Pneumococcal Conjugate-13 06/05/2013  . Pneumococcal Polysaccharide-23 02/20/2010, 07/11/2020  . Tdap 05/05/2011    TDAP status: Up to date  Flu Vaccine status: Up to date  Pneumococcal vaccine status: Up to date  Covid-19 vaccine status: Completed vaccines  Qualifies for Shingles Vaccine? Yes   Zostavax completed No   Shingrix Completed?: No.    Education has been provided regarding the importance of this vaccine. Patient has been advised to call insurance company to determine out of pocket expense if they have not yet received this vaccine. Advised may also receive vaccine at local pharmacy or Health Dept. Verbalized acceptance and understanding.  Screening Tests Health Maintenance  Topic Date Due  . OPHTHALMOLOGY EXAM  07/23/2020  . FOOT EXAM  08/07/2020  . COLONOSCOPY (Pts 45-47yr Insurance coverage will need to be confirmed)  11/27/2020  . INFLUENZA VACCINE  12/29/2020  . HEMOGLOBIN A1C  01/08/2021  . MAMMOGRAM  03/14/2021  . TETANUS/TDAP  05/04/2021  . DEXA SCAN  Completed  . COVID-19 Vaccine  Completed  . Hepatitis C Screening  Completed  . PNA vac Low Risk Adult  Completed  . HPV VACCINES  Aged Out    Health Maintenance  Health Maintenance Due  Topic Date Due  . OPHTHALMOLOGY EXAM  07/23/2020  . FOOT EXAM  08/07/2020    Colorectal cancer screening: Type of screening: Colonoscopy. Completed 11/28/2015. Repeat every 5 years  Mammogram status: Completed Bilateral 03/14/2020. Repeat every year  Bone Density status: Completed 01/02/2019.  Results reflect: Bone density results: OSTEOPENIA. Repeat every 2 years.  Lung Cancer Screening: (Low Dose CT Chest recommended if Age 76-80years, 30 pack-year currently smoking OR have quit w/in 15years.) does not qualify.    Additional Screening:  Hepatitis C Screening:  Completed 06/11/2015  Vision Screening: Recommended annual ophthalmology exams for early detection of glaucoma and other disorders of the eye. Is the patient up to date with their annual eye exam?  No  Who is the provider or what is the name of the office  in which the patient attends annual eye exams? Dr. Sabra Heck   Dental Screening: Recommended annual dental exams for proper oral hygiene  Community Resource Referral / Chronic Care Management: CRR required this visit?  No   CCM required this visit?  No      Plan:     I have personally reviewed and noted the following in the patient's chart:   . Medical and social history . Use of alcohol, tobacco or illicit drugs  . Current medications and supplements . Functional ability and status . Nutritional status . Physical activity . Advanced directives . List of other physicians . Hospitalizations, surgeries, and ER visits in previous 12 months . Vitals . Screenings to include cognitive, depression, and falls . Referrals and appointments  In addition, I have reviewed and discussed with patient certain preventive protocols, quality metrics, and best practice recommendations. A written personalized care plan for preventive services as well as general preventive health recommendations were provided to patient.   Patient would like to access avs on mychart.  Marta Antu, LPN   [phone]  Nurse Health Advisor  Nurse Notes: None

## 2020-09-30 ENCOUNTER — Other Ambulatory Visit: Payer: Self-pay

## 2020-09-30 ENCOUNTER — Ambulatory Visit (INDEPENDENT_AMBULATORY_CARE_PROVIDER_SITE_OTHER): Payer: Medicare Other | Admitting: Orthopaedic Surgery

## 2020-09-30 ENCOUNTER — Encounter: Payer: Self-pay | Admitting: Orthopaedic Surgery

## 2020-09-30 VITALS — BP 120/78 | HR 93 | Ht 65.0 in | Wt 201.0 lb

## 2020-09-30 DIAGNOSIS — Z9889 Other specified postprocedural states: Secondary | ICD-10-CM

## 2020-09-30 NOTE — Progress Notes (Signed)
Post-Op Visit Note   Patient: Kristy [name]           Date of Birth: 08-04-1945           MRN: [id number] Visit Date: 09/30/2020 PCP: Colon Branch, MD   Assessment & Plan:post L4-5 decompression with microdiscectomy  Chief Complaint:  Chief Complaint  Patient presents with  . Lower Back - Routine Post Op   Visit Diagnoses:  1. Status post lumbar spine surgery for decompression of spinal cord     Plan: return PRN.   Follow-Up Instructions: No follow-ups on file.   Orders:  No orders of the defined types were placed in this encounter.  No orders of the defined types were placed in this encounter.   Imaging: No results found.  PMFS History: Patient Active Problem List   Diagnosis Date Noted  . Status post lumbar spine surgery for decompression of spinal cord 08/26/2020  . Lumbar disc herniation 05/28/2020  . Spinal stenosis of lumbar region 05/28/2020  . H/O vitamin D deficiency 09/21/2018  . Elevated LFTs 12/25/2015  . Dizzy 07/31/2015  . PCP  notes >>>>>>>>>>>>>>>> 02/11/2015  . Hyperparathyroidism ([place]) 06/07/2014  . Benign paroxysmal positional vertigo 02/19/2014  . FHx: BRCA2 gene positive   . Family history of malignant neoplasm of breast   . Family history of breast cancer 01/10/2014  . Tachycardia 11/19/2013  . Elevated LDL cholesterol level 06/11/2013  . Obesity, Class I, BMI 30.0-34.9 (see actual BMI) 06/11/2013  . Osteopenia 06/06/2013  . Menopause 01/09/2013  . History of TIA (transient ischemic attack) 07/15/2012  . Annual physical exam 05/06/2011  . SUI (stress urinary incontinence, female)   . Atrophic vaginitis   . GOUT, UNSPECIFIED 04/28/2010  . Diabetes ([place]) 04/28/2010  . Meniere's disease 07/30/2008  . Hearing loss 07/30/2008  . Essential hypertension 08/19/2007  . ALLERGIC RHINITIS 08/19/2007  . ASTHMA 08/19/2007  . ACID REFLUX DISEASE 08/19/2007  . IRRITABLE BOWEL SYNDROME 08/19/2007  . DEGENERATIVE DISC DISEASE 08/19/2007  .  FIBROMYALGIA 08/19/2007  . FATIGUE, CHRONIC 08/19/2007   Past Medical History:  Diagnosis Date  . Allergy    Environmental, foods: shellfish, lactose intolerant, citrus, peanuts  . Asthma    mild persistent  . Atrophic vaginitis   . BRCA gene mutation negative 2015   [facility] done at [facility]  . Diabetes mellitus without complication (HCC)    Type 2  . DJD (degenerative joint disease)    generalized joint pain, including back  . Dysrhythmia    SVT  . Family history of malignant neoplasm of breast   . Fibromyalgia    Dr Amil Amen  . GERD (gastroesophageal reflux disease)   . Gout    Idiopathic, chronic  . Hiatal hernia   . History of kidney stones   . Hypertension   . Palpitations    PSVT on Holter monitoring  . Shortness of breath    occ  . Stroke [facility])    TIA  . SUI (stress urinary incontinence, female)    (resolved after surgery)  . TIA (transient ischemic attack)    07/2012, see Dr Leonie Man  . Vertigo     Family History  Problem Relation Age of Onset  . Breast cancer Mother 17       deceased 24  . Breast cancer Sister 61       bilateral breast ca @ 36; BRCA2 positive  . Hypertension Sister   . Cancer Father  Brain tumor; path?; deceased 3  . Lymphoma Maternal Aunt        deceased 13s  . Hemochromatosis Sister   . Thalassemia Sister   . Other Sister        negative for BRCA1 BRCA2  . Breast cancer Cousin        BRCA2 positive  . Diabetes Other   . Breast cancer Other        distant maternal female relatives with breast cancer  . CAD Son        MI  . Heart failure Sister        due to chemo?  . Colon cancer Neg Hx     Past Surgical History:  Procedure Laterality Date  . ABDOMINAL HYSTERECTOMY  1994   TAH,BSO-BLADDER NECK SUSPENSION  . BREAST SURGERY     LUMPECTOMY-BENIGN Breast Bx X2  . CARPAL TUNNEL RELEASE Right 2005  . CATARACT EXTRACTION, BILATERAL Bilateral 2016   Cataract   . CHOLECYSTECTOMY, LAPAROSCOPIC  2001  . CYSTOCELE REPAIR   1994  . EYE SURGERY    . JOINT REPLACEMENT     bilatral knees;   Marland Kitchen KNEE ARTHROPLASTY  05/19/2012   Procedure: COMPUTER ASSISTED TOTAL KNEE ARTHROPLASTY;  Surgeon: Marybelle Killings, MD;  Location: [place];  Service: Orthopedics;  Laterality: Left;  Left  Total Knee Arthroplasty  . KNEE ARTHROPLASTY Right 09/03/2013   Procedure: COMPUTER ASSISTED TOTAL KNEE ARTHROPLASTY;  Surgeon: Marybelle Killings, MD;  Location: [place];  Service: Orthopedics;  Laterality: Right;  Right Total Knee Arthroplasty, Computer Assist  . KNEE ARTHROSCOPY     bil  . LUMBAR LAMINECTOMY/DECOMPRESSION MICRODISCECTOMY N/A 08/18/2020   Procedure: L4-5 decompression, possible left L4-5 microdiscectomy;  Surgeon: Marybelle Killings, MD;  Location: [place];  Service: Orthopedics;  Laterality: N/A;  . [place] TIBIAL TENDON  2003  . TONSILLECTOMY    . TRIPLE FUSION LEFT ANKLE Left 2007  . TUBAL LIGATION     Social History   Occupational History  . 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 Smoking status: Never Smoker  . Smokeless tobacco: Never Used  Vaping Use  . Vaping Use: Never used  Substance and Sexual Activity  . Alcohol use: No    Alcohol/week: 0.0 standard drinks  . Drug use: No  . Sexual activity: Not Currently    Birth control/protection: Surgical

## 2020-10-01 IMAGING — MG DIGITAL SCREENING BILATERAL MAMMOGRAM WITH TOMO AND CAD
6 of 10 series · 6 of 30 positions shown · non-contrast
Comparison: Previous exam(s).

CLINICAL DATA: Screening.

EXAM:
DIGITAL SCREENING BILATERAL MAMMOGRAM WITH TOMO AND CAD

[L MLO synth-2D (1 of 2)]
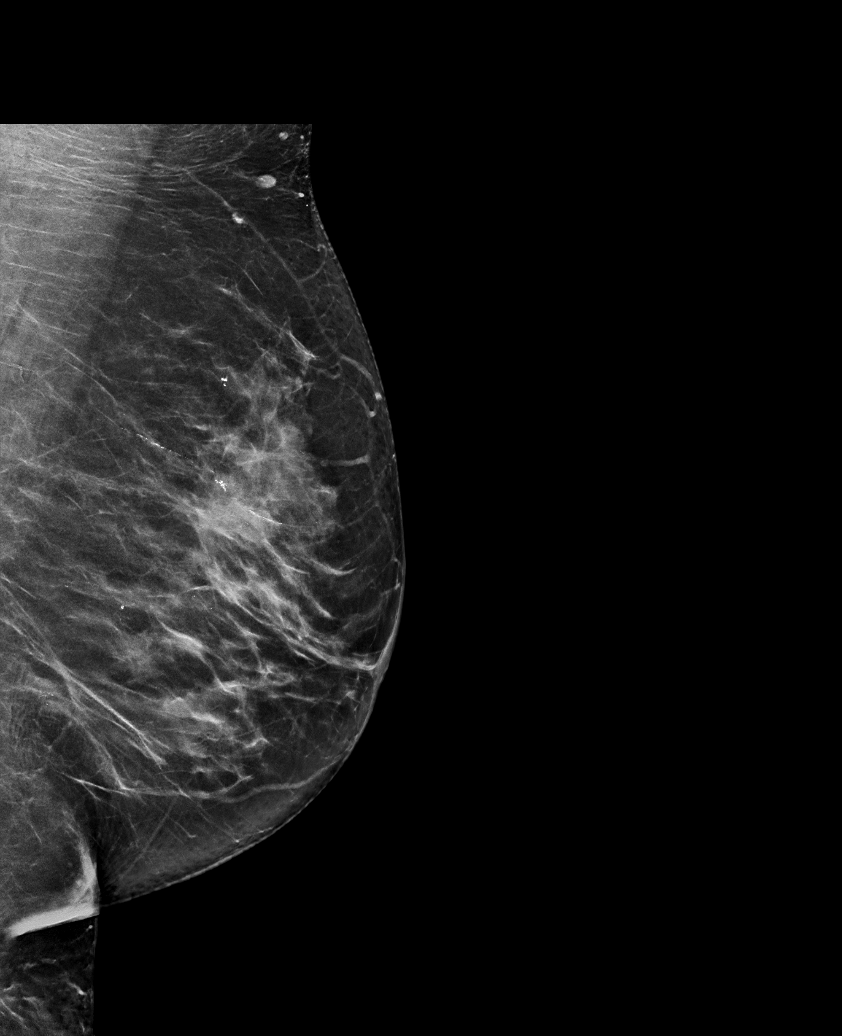

[L CC synth-2D]
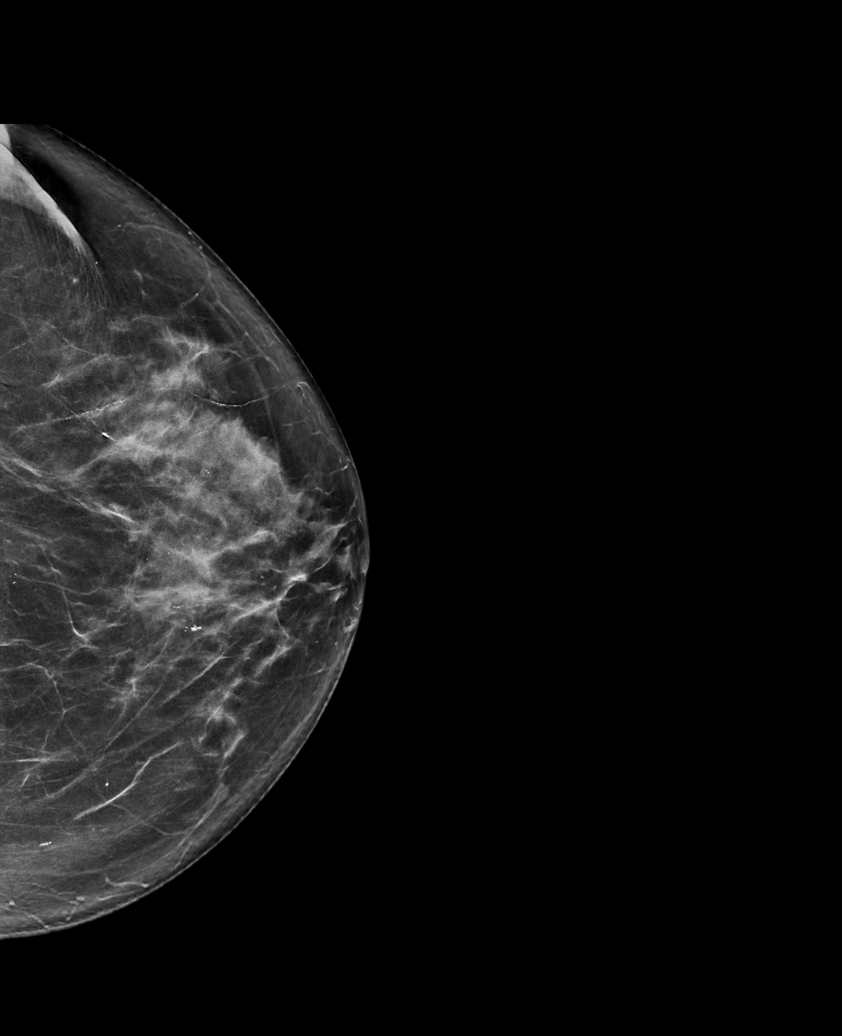

[R MLO synth-2D]
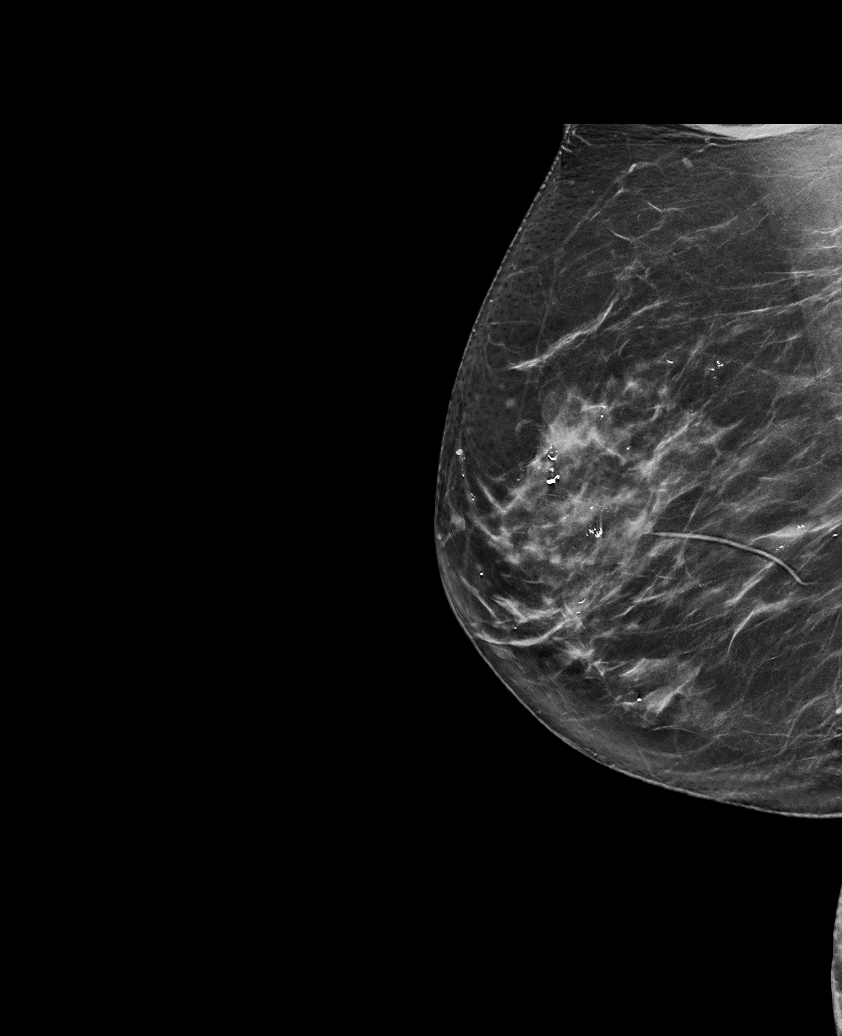

[L MLO synth-2D (2 of 2)]
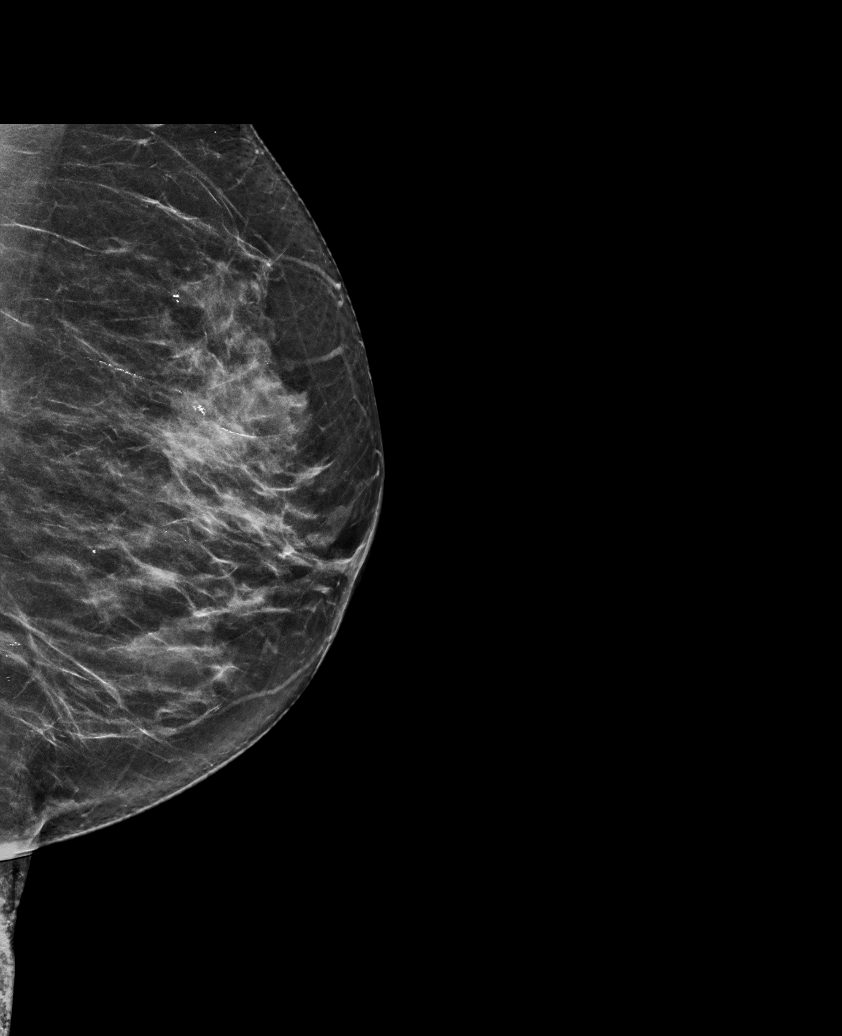

[R CC synth-2D]
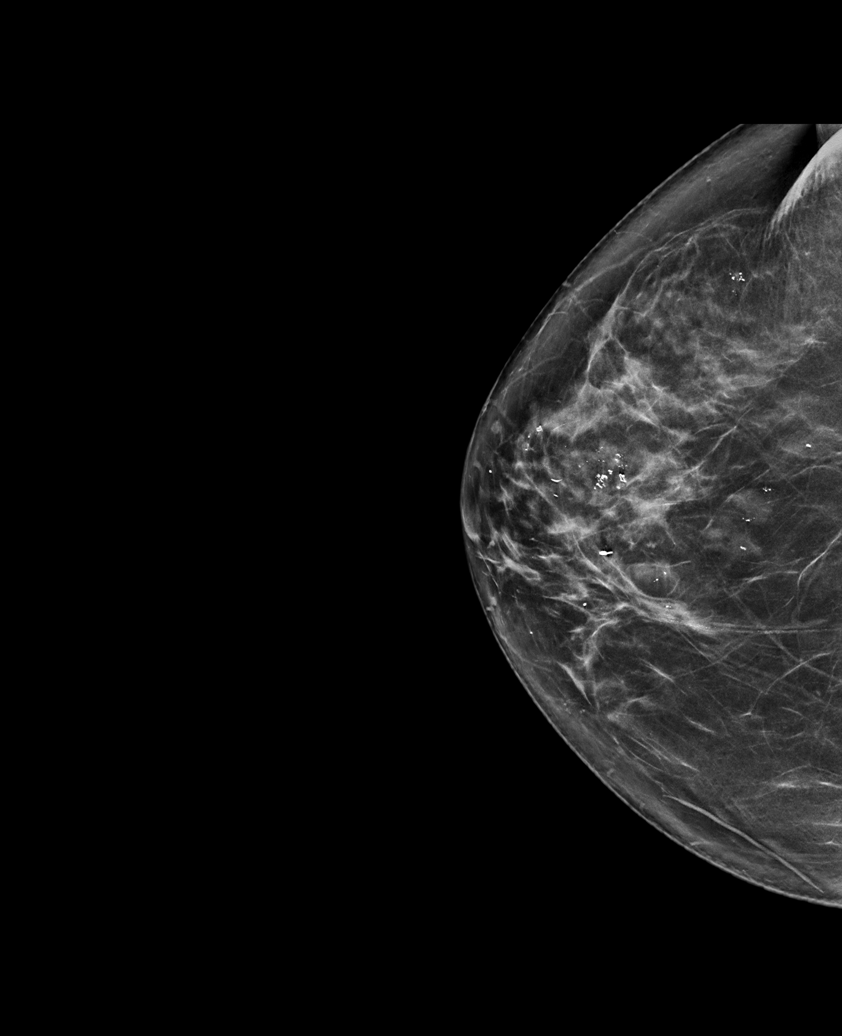

[L CC tomo · tomo slice 37/72.0]
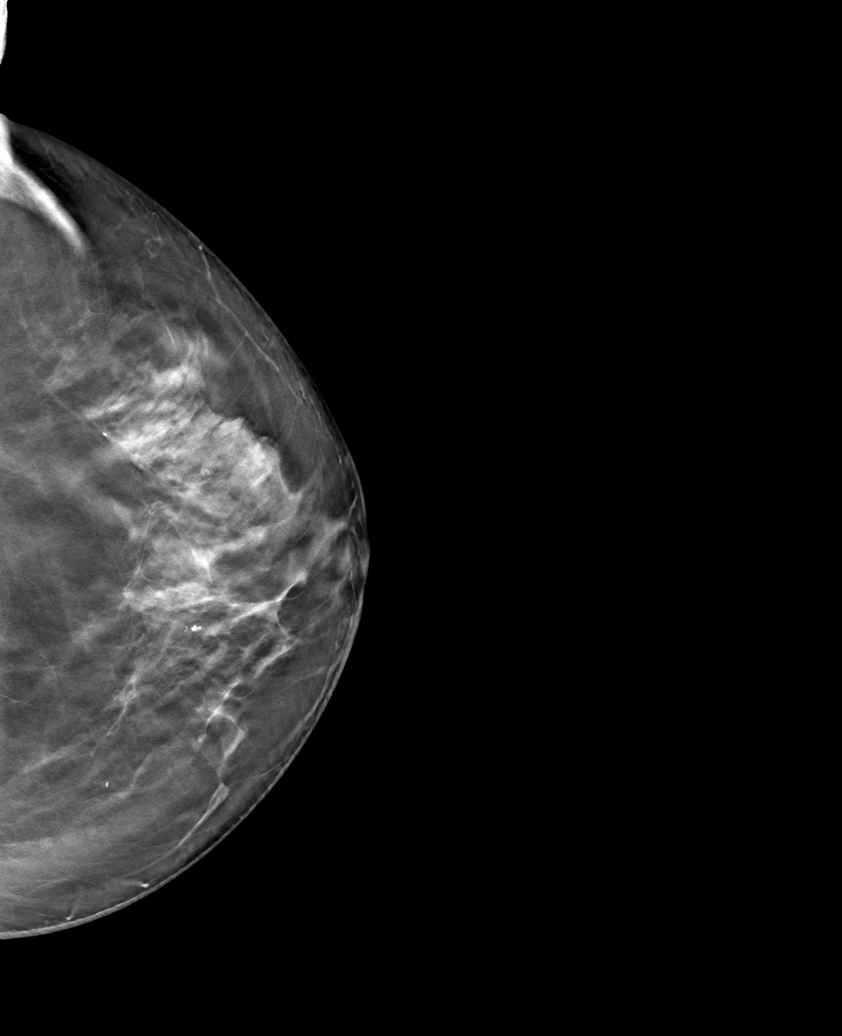

[6 of 30 positions shown; findings below may reference images not displayed]

ACR Breast Density Category c: The breast tissue is heterogeneously
dense, which may obscure small masses.
FINDINGS: There are no findings suspicious for malignancy. Images were
processed with CAD.
IMPRESSION: No mammographic evidence of malignancy. A result letter of this
screening mammogram will be mailed directly to the patient.

RECOMMENDATION:
Screening mammogram in one year. (Code:FT-U-LHB)

BI-RADS CATEGORY  1: Negative.

## 2020-10-15 ENCOUNTER — Other Ambulatory Visit: Payer: Self-pay

## 2020-10-15 ENCOUNTER — Ambulatory Visit (INDEPENDENT_AMBULATORY_CARE_PROVIDER_SITE_OTHER): Payer: Medicare Other | Admitting: Cardiology

## 2020-10-15 ENCOUNTER — Encounter: Payer: Self-pay | Admitting: Cardiology

## 2020-10-15 VITALS — BP 122/70 | HR 88 | Ht 65.0 in | Wt 201.2 lb

## 2020-10-15 DIAGNOSIS — I10 Essential (primary) hypertension: Secondary | ICD-10-CM | POA: Diagnosis not present

## 2020-10-15 DIAGNOSIS — R0600 Dyspnea, unspecified: Secondary | ICD-10-CM | POA: Diagnosis not present

## 2020-10-15 DIAGNOSIS — E785 Hyperlipidemia, unspecified: Secondary | ICD-10-CM

## 2020-10-15 DIAGNOSIS — R0609 Other forms of dyspnea: Secondary | ICD-10-CM

## 2020-10-15 DIAGNOSIS — I471 Supraventricular tachycardia: Secondary | ICD-10-CM | POA: Diagnosis not present

## 2020-10-15 NOTE — Progress Notes (Signed)
Cardiology Office Note:    Date:  10/15/2020   ID:  Kristy [name], DOB 13-Feb-1946, MRN [id number]  PCP:  Colon Branch, MD  Cardiologist:  Donato Heinz, MD  Electrophysiologist:  None   Referring MD: Colon Branch, MD   Chief Complaint  Patient presents with  . Shortness of Breath    History of Present Illness:    Kristy [name] is a 75 y.o. female with a hx of TIA, fibromyalgia, SVT who presents for follow-up.  She was referred by Dr.Paz for preop evaluation prior to spinal surgery, initially seen on 07/18/2020.  She previously followed with Dr. Gwenlyn Found, last seen in 2017.  Had been followed for SVT. Echocardiogram in 2014 showed no significant abnormalities.  At initial clinic visit, reported dyspnea with minimal exertion.  Lexiscan Myoview was done on 07/22/2020, which showed fixed basal to mid anteroseptal defect likely representing artifact versus infarct, EF 49%.  Echocardiogram on 07/23/2020 showed LVEF 60 to 40%, grade 1 diastolic dysfunction, normal RV function, no significant valvular disease.  Today, she is doing well. She reports that the only pain she experiences is back pain. She states that her breathing has been fairly well since she has switched inhalers. She states that she is experiencing lightheadedness but she does not know what is causing it. She states that her blood pressure has been occasionally low. She states that she checks her blood pressure regularly and it usually drops after she eats. Her lowest at home blood pressure reading was 89/45. She denies and chest pain, palpitations, or lower extremity edema.     Past Medical History:  Diagnosis Date  . Allergy    Environmental, foods: shellfish, lactose intolerant, citrus, peanuts  . Asthma    mild persistent  . Atrophic vaginitis   . BRCA gene mutation negative 2015   testing done at [facility] long  . Diabetes mellitus without complication (HCC)    Type 2  . DJD (degenerative joint disease)    generalized  joint pain, including back  . Dysrhythmia    SVT  . Family history of malignant neoplasm of breast   . Fibromyalgia    Dr Amil Amen  . GERD (gastroesophageal reflux disease)   . Gout    Idiopathic, chronic  . Hiatal hernia   . History of kidney stones   . Hypertension   . Palpitations    PSVT on Holter monitoring  . Shortness of breath    occ  . Stroke [facility])    TIA  . SUI (stress urinary incontinence, female)    (resolved after surgery)  . TIA (transient ischemic attack)    07/2012, see Dr Leonie Man  . Vertigo     Past Surgical History:  Procedure Laterality Date  . ABDOMINAL HYSTERECTOMY  1994   TAH,BSO-BLADDER NECK SUSPENSION  . BREAST SURGERY     LUMPECTOMY-BENIGN Breast Bx X2  . CARPAL TUNNEL RELEASE Right 2005  . CATARACT EXTRACTION, BILATERAL Bilateral 2016   Cataract   . CHOLECYSTECTOMY, LAPAROSCOPIC  2001  . CYSTOCELE REPAIR  1994  . EYE SURGERY    . JOINT REPLACEMENT     bilatral knees;   Marland Kitchen KNEE ARTHROPLASTY  05/19/2012   Procedure: COMPUTER ASSISTED TOTAL KNEE ARTHROPLASTY;  Surgeon: Marybelle Killings, MD;  Location: [place];  Service: Orthopedics;  Laterality: Left;  Left  Total Knee Arthroplasty  . KNEE ARTHROPLASTY Right 09/03/2013   Procedure: COMPUTER ASSISTED TOTAL KNEE ARTHROPLASTY;  Surgeon: Marybelle Killings, MD;  Location: [place];  Service: Orthopedics;  Laterality: Right;  Right Total Knee Arthroplasty, Computer Assist  . KNEE ARTHROSCOPY     bil  . LUMBAR LAMINECTOMY/DECOMPRESSION MICRODISCECTOMY N/A 08/18/2020   Procedure: L4-5 decompression, possible left L4-5 microdiscectomy;  Surgeon: Marybelle Killings, MD;  Location: [place];  Service: Orthopedics;  Laterality: N/A;  . [place] TIBIAL TENDON  2003  . TONSILLECTOMY    . TRIPLE FUSION LEFT ANKLE Left 2007  . TUBAL LIGATION      Current Medications: Current Meds  Medication Sig  . aspirin EC 81 MG tablet Take 81 mg by mouth daily.  Marland Kitchen atorvastatin (LIPITOR) 10 MG tablet Take 1  tablet (10 mg total) by mouth at bedtime.  . Calcium Carbonate (CALCIUM 600 PO) Take 600 mg by mouth in the morning and at bedtime. (1600 & bedtime)  . Cholecalciferol 50 MCG (2000 UT) CAPS Take 2,000 Units by mouth at bedtime.   Marland Kitchen diltiazem (CARDIZEM CD) 240 MG 24 hr capsule Take 1 capsule (240 mg total) by mouth daily.  . empagliflozin (JARDIANCE) 10 MG TABS tablet Take 1 tablet (10 mg total) by mouth daily before breakfast.  . EPINEPHrine (EPI-PEN) 0.3 mg/0.3 mL DEVI Inject 0.3 mg into the muscle See admin instructions. Inject 0.3 mg intramuscularly in the leg as needed for severe allergic reaction, call 911 and then inject again in the other leg. Reported on 06/26/2015  . febuxostat (ULORIC) 40 MG tablet Take 40 mg by mouth every evening.   . fluticasone (FLONASE) 50 MCG/ACT nasal spray Place 2 sprays into both nostrils in the morning.  . fluticasone (FLOVENT HFA) 110 MCG/ACT inhaler Inhale 2 puffs into the lungs daily.  Marland Kitchen levalbuterol (XOPENEX HFA) 45 MCG/ACT inhaler Inhale 2 puffs into the lungs every 4 (four) hours as needed for shortness of breath.  . levocetirizine (XYZAL) 5 MG tablet Take 5 mg by mouth daily at 4 PM.  . losartan (COZAAR) 25 MG tablet Take 1 tablet (25 mg total) by mouth daily.  . metFORMIN (GLUCOPHAGE) 850 MG tablet Take 1.5 tablets (1,275 mg total) by mouth 2 (two) times daily with a meal. (Patient taking differently: Take 850 mg by mouth in the morning, at noon, and at bedtime.)  . metoprolol succinate (TOPROL-XL) 50 MG 24 hr tablet Take 1 tablet (50 mg total) by mouth daily. Take with or immediately following a meal.  . nortriptyline (PAMELOR) 25 MG capsule Take 25 mg by mouth daily at 4 PM.  . omeprazole (PRILOSEC) 40 MG capsule TAKE 1 CAPSULE BY MOUTH EVERY DAY (Patient taking differently: Take 40 mg by mouth daily before breakfast.)  . pioglitazone (ACTOS) 30 MG tablet Take 1 tablet (30 mg total) by mouth daily. (Patient taking differently: Take 30 mg by mouth every  evening.)  . vitamin E 400 UNIT capsule Take 400 Units by mouth daily at 4 PM.     Allergies:   Iodine, Shellfish allergy, Sulfonamide derivatives, Citrus, Lactose intolerance (gi), and Amoxicillin   Social History   Socioeconomic History  . Marital status: Married    Spouse name: [name]  . Number of children: 2  . Years of education: 78  . Highest education level: Not on file  Occupational History  . 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 Smoking status: Never Smoker  . Smokeless tobacco: Never Used  Vaping Use  . Vaping Use: Never used  Substance and Sexual Activity  . Alcohol use: No    Alcohol/week: 0.0 standard drinks  . Drug use: No  . Sexual activity: Not Currently    Birth control/protection: Surgical  Other Topics Concern  . Not on file  Social History Narrative   HSG   1 year college for accounting. married '69. 2 sons - '70, '72: retired - [employer].     ACP - OK for CPR at least once, no long term ventilation, no heroic measures in the face of poor quality of life. HCPOA - husband, secondary son Elowen Debruyn (c) [phone]       Social Determinants of Health   Financial Resource Strain: Low Risk   . Difficulty of Paying Living Expenses: Not hard at all  Food Insecurity: No Food Insecurity  . Worried About Charity fundraiser in the Last Year: Never true  . Ran Out of Food in the Last Year: Never true  Transportation Needs: No Transportation Needs  . Lack of Transportation (Medical): No  . Lack of Transportation (Non-Medical): No  Physical Activity: Sufficiently Active  . Days of Exercise per Week: 7 days  . Minutes of Exercise per Session: 30 min  Stress: No Stress Concern Present  . Feeling of Stress : Not at all  Social Connections: Moderately Isolated  . Frequency of Communication with Friends and Family: More than three times a week  . Frequency of Social Gatherings with Friends and Family: More than three times a week   . Attends Religious Services: Never  . Active Member of Clubs or Organizations: No  . Attends Archivist Meetings: Never  . Marital Status: Married     Family History: The patient's family history includes Breast cancer in her cousin and another family member; Breast cancer (age of onset: 46) in her mother; Breast cancer (age of onset: 77) in her sister; CAD in her son; Cancer in her father; Diabetes in an other family member; Heart failure in her sister; Hemochromatosis in her sister; Hypertension in her sister; Lymphoma in her maternal aunt; Other in her sister; Thalassemia in her sister. There is no history of Colon cancer.  ROS:   Please see the history of present illness.     All other systems reviewed and are negative.  EKGs/Labs/Other Studies Reviewed:    The following studies were reviewed today:   EKG:  10/15/2020-  No ekg was ordered today.  07/18/2020- The ekg ordered demonstrates normal sinus rhythm with sinus arrhythmia, rate 75, no ST abnormalities  Recent Labs: 01/08/2020: TSH 3.91 08/13/2020: ALT 19; BUN 20; Creatinine, Ser 1.01; Hemoglobin 13.4; Platelets 306; Potassium 3.8; Sodium 137  Recent Lipid Panel    Component Value Date/Time   CHOL 103 09/10/2019 1035   TRIG 204.0 (H) 09/10/2019 1035   HDL 30.60 (L) 09/10/2019 1035   CHOLHDL 3 09/10/2019 1035   VLDL 40.8 (H) 09/10/2019 1035   LDLCALC 85 07/15/2012 0500   LDLDIRECT 43.0 09/10/2019 1035    Physical Exam:    VS:  BP 122/70 (BP Location: Left Arm, Patient Position: Sitting, Cuff Size: Large)   Pulse 88   Ht _0  (1.651 m)   Wt 201 lb 3.2 oz (91.3 kg)   BMI 33.48 kg/m     Wt Readings from Last 3 Encounters:  10/15/20 201 lb 3.2 oz (91.3 kg)  09/30/20 201 lb (91.2 kg)  09/25/20 199 lb (90.3 kg)     GEN:  in no acute distress HEENT: Normal  NECK: No JVD; No carotid bruits CARDIAC: RRR, no murmurs, rubs, gallops RESPIRATORY:  Clear to auscultation without rales, wheezing or rhonchi   ABDOMEN: Soft, non-tender, non-distended MUSCULOSKELETAL:  No edema; No deformity  SKIN: Warm and dry NEUROLOGIC:  Alert and oriented x 3 PSYCHIATRIC:  Normal affect   ASSESSMENT:    1. DOE (dyspnea on exertion)   2. SVT (supraventricular tachycardia) ([place])   3. Essential hypertension   4. Hyperlipidemia, unspecified hyperlipidemia type    PLAN:    Dyspnea: At initial clinic visit, reported dyspnea with minimal exertion.  Lexiscan Myoview was done on 07/22/2020, which showed fixed basal to mid anteroseptal defect likely representing artifact versus infarct, EF 49%.  Echocardiogram on 07/23/2020 showed LVEF 60 to 29%, grade 1 diastolic dysfunction, normal RV function, no significant valvular disease. -Given echo findings, suspect abnormality on Myoview represents artifact and no further cardiac work-up recommended  SVT: On diltiazem 240 mg daily and Toprol-XL 50 mg daily.  Reports no recent palpitations.  Hyperlipidemia: On atorvastatin 10 mg daily.  LDL 43 on 09/10/2019.  Hypertension: On losartan 25 mg daily, diltiazem 240 mg daily, and Toprol-XL 50 mg daily.  Normotensive in clinic today but reports occasional low BP.  Asked to check BP twice daily for next week and call with results  RTC in 1 year   Medication Adjustments/Labs and Tests Ordered: Current medicines are reviewed at length with the patient today.  Concerns regarding medicines are outlined above.  No orders of the defined types were placed in this encounter.  No orders of the defined types were placed in this encounter.   Patient Instructions  Medication Instructions:  Your physician recommends that you continue on your current medications as directed. Please refer to the Current Medication list given to you today.  *If you need a refill on your cardiac medications before your next appointment, please call your pharmacy*  Follow-Up: At [facility], you and your health needs are our priority.  As part of our  continuing mission to provide you with exceptional heart care, we have created designated Provider Care Teams.  These Care Teams include your primary Cardiologist (physician) and Advanced Practice Providers (APPs -  Physician Assistants and Nurse Practitioners) who all work together to provide you with the care you need, when you need it.  We recommend signing up for the patient portal called "MyChart".  Sign up information is provided on this After Visit Summary.  MyChart is used to connect with patients for Virtual Visits (Telemedicine).  Patients are able to view lab/test results, encounter notes, upcoming appointments, etc.  Non-urgent messages can be sent to your provider as well.   To learn more about what you can do with MyChart, go to [email or web address].    Your next appointment:   12 month(s)  The format for your next appointment:   In Person  Provider:   Oswaldo Milian, MD   Other Instructions Please check your blood pressure at home twice daily, write it down.  Call the office or send message via Mychart with the readings in 1 week for Dr. Gardiner Rhyme to review.         Frederic Jericho Moorehead,acting as a Education administrator for Donato Heinz, MD.,have documented all relevant documentation on the behalf of Donato Heinz, MD,as directed by  Donato Heinz, MD while in the presence of Donato Heinz, MD.  I, Donato Heinz, MD, have reviewed all documentation for this visit. The documentation on 10/15/20 for the exam,  diagnosis, procedures, and orders are all accurate and complete.   Signed, Donato Heinz, MD  10/15/2020 9:47 AM    [facility]

## 2020-10-15 NOTE — Patient Instructions (Signed)
Medication Instructions:  Your physician recommends that you continue on your current medications as directed. Please refer to the Current Medication list given to you today.  *If you need a refill on your cardiac medications before your next appointment, please call your pharmacy*  Follow-Up: At [facility], you and your health needs are our priority.  As part of our continuing mission to provide you with exceptional heart care, we have created designated Provider Care Teams.  These Care Teams include your primary Cardiologist (physician) and Advanced Practice Providers (APPs -  Physician Assistants and Nurse Practitioners) who all work together to provide you with the care you need, when you need it.  We recommend signing up for the patient portal called "MyChart".  Sign up information is provided on this After Visit Summary.  MyChart is used to connect with patients for Virtual Visits (Telemedicine).  Patients are able to view lab/test results, encounter notes, upcoming appointments, etc.  Non-urgent messages can be sent to your provider as well.   To learn more about what you can do with MyChart, go to [email or web address].    Your next appointment:   12 month(s)  The format for your next appointment:   In Person  Provider:   Oswaldo Milian, MD   Other Instructions Please check your blood pressure at home twice daily, write it down.  Call the office or send message via Mychart with the readings in 1 week for Dr. Gardiner Rhyme to review.

## 2020-10-15 NOTE — Progress Notes (Deleted)
Cardiology Office Note:    Date:  10/15/2020   ID:  Kristy [name], DOB 08-Mar-1946, MRN [id number]  PCP:  Colon Branch, MD  Cardiologist:  Donato Heinz, MD  Electrophysiologist:  None   Referring MD: Colon Branch, MD   No chief complaint on file.   History of Present Illness:    Kristy [name] is a 75 y.o. female with a hx of TIA, fibromyalgia, SVT who presents for follow-up.  She was referred by Dr.Paz for preop evaluation prior to spinal surgery, initially seen on 07/18/2020.  She previously followed with Dr. Gwenlyn Found, last seen in 2017.  Had been followed for SVT. Echocardiogram in 2014 showed no significant abnormalities.  At initial clinic visit, reported dyspnea with minimal exertion.  Lexiscan Myoview was done on 07/22/2020, which showed fixed basal to mid anteroseptal defect likely representing artifact versus infarct, EF 49%.  Echocardiogram on 07/23/2020 showed LVEF 60 to 82%, grade 1 diastolic dysfunction, normal RV function, no significant valvular disease.  Since last clinic visit,  She reports no recent issues with her SVT.  States she has not had issues with palpitations since she started metoprolol.  Does report she has been having shortness of breath.  States that her activity has been minimal due to back pain.  Unable to walk up a flight of stairs.  States that prior to Thanksgiving she was able to walk up stairs but reports she would get short of breath with this.  She denies any chest pain.  Reports occasional dizziness but denies any syncopal episodes.  Denies any lower extremity edema.  States that she had some low blood pressures recently and her Lasix was discontinued.   Past Medical History:  Diagnosis Date  . Allergy    Environmental, foods: shellfish, lactose intolerant, citrus, peanuts  . Asthma    mild persistent  . Atrophic vaginitis   . BRCA gene mutation negative 2015   testing done at [facility] long  . Diabetes mellitus without complication (HCC)     Type 2  . DJD (degenerative joint disease)    generalized joint pain, including back  . Dysrhythmia    SVT  . Family history of malignant neoplasm of breast   . Fibromyalgia    Dr Amil Amen  . GERD (gastroesophageal reflux disease)   . Gout    Idiopathic, chronic  . Hiatal hernia   . History of kidney stones   . Hypertension   . Palpitations    PSVT on Holter monitoring  . Shortness of breath    occ  . Stroke [facility])    TIA  . SUI (stress urinary incontinence, female)    (resolved after surgery)  . TIA (transient ischemic attack)    07/2012, see Dr Leonie Man  . Vertigo     Past Surgical History:  Procedure Laterality Date  . ABDOMINAL HYSTERECTOMY  1994   TAH,BSO-BLADDER NECK SUSPENSION  . BREAST SURGERY     LUMPECTOMY-BENIGN Breast Bx X2  . CARPAL TUNNEL RELEASE Right 2005  . CATARACT EXTRACTION, BILATERAL Bilateral 2016   Cataract   . CHOLECYSTECTOMY, LAPAROSCOPIC  2001  . CYSTOCELE REPAIR  1994  . EYE SURGERY    . JOINT REPLACEMENT     bilatral knees;   Marland Kitchen KNEE ARTHROPLASTY  05/19/2012   Procedure: COMPUTER ASSISTED TOTAL KNEE ARTHROPLASTY;  Surgeon: Marybelle Killings, MD;  Location: [place];  Service: Orthopedics;  Laterality: Left;  Left  Total Knee Arthroplasty  . KNEE ARTHROPLASTY Right  09/03/2013   Procedure: COMPUTER ASSISTED TOTAL KNEE ARTHROPLASTY;  Surgeon: Marybelle Killings, MD;  [place];  Service: Orthopedics;  Laterality: Right;  Right Total Knee Arthroplasty, Computer Assist  . KNEE ARTHROSCOPY     bil  . LUMBAR LAMINECTOMY/DECOMPRESSION MICRODISCECTOMY N/A 08/18/2020   Procedure: L4-5 decompression, possible left L4-5 microdiscectomy;  Surgeon: Marybelle Killings, MD;  Location: [place];  Service: Orthopedics;  Laterality: N/A;  . [place] TIBIAL TENDON  2003  . TONSILLECTOMY    . TRIPLE FUSION LEFT ANKLE Left 2007  . TUBAL LIGATION      Current Medications: No outpatient medications have been marked as taking for the 10/15/20  encounter (Appointment) with Donato Heinz, MD.     Allergies:   Iodine, Shellfish allergy, Sulfonamide derivatives, Citrus, Lactose intolerance (gi), and Amoxicillin   Social History   Socioeconomic History  . Marital status: Married    Spouse name: [name]  . Number of children: 2  . Years of education: 64  . Highest education level: Not on file  Occupational History  . 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 Smoking status: Never Smoker  . Smokeless tobacco: Never Used  Vaping Use  . Vaping Use: Never used  Substance and Sexual Activity  . Alcohol use: No    Alcohol/week: 0.0 standard drinks  . Drug use: No  . Sexual activity: Not Currently    Birth control/protection: Surgical  Other Topics Concern  . Not on file  Social History Narrative   HSG   1 year college for accounting. married '69. 2 sons - '70, '72: retired - [employer].     ACP - OK for CPR at least once, no long term ventilation, no heroic measures in the face of poor quality of life. HCPOA - husband, secondary son Tim [place] (c) [phone]       Social Determinants of Health   Financial Resource Strain: Low Risk   . Difficulty of Paying Living Expenses: Not hard at all  Food Insecurity: No Food Insecurity  . Worried About Charity fundraiser in the Last Year: Never true  . Ran Out of Food in the Last Year: Never true  Transportation Needs: No Transportation Needs  . Lack of Transportation (Medical): No  . Lack of Transportation (Non-Medical): No  Physical Activity: Sufficiently Active  . Days of Exercise per Week: 7 days  . Minutes of Exercise per Session: 30 min  Stress: No Stress Concern Present  . Feeling of Stress : Not at all  Social Connections: Moderately Isolated  . Frequency of Communication with Friends and Family: More than three times a week  . Frequency of Social Gatherings with Friends and Family: More than three times a week  . Attends Religious  Services: Never  . Active Member of Clubs or Organizations: No  . Attends Archivist Meetings: Never  . Marital Status: Married     Family History: The patient's family history includes Breast cancer in her cousin and another family member; Breast cancer (age of onset: 56) in her mother; Breast cancer (age of onset: 46) in her sister; CAD in her son; Cancer in her father; Diabetes in an other family member; Heart failure in her sister; Hemochromatosis in her sister; Hypertension in her sister; Lymphoma in her maternal aunt; Other in her sister; Thalassemia in her sister. There is no history of Colon  cancer.  ROS:   Please see the history of present illness.     All other systems reviewed and are negative.  EKGs/Labs/Other Studies Reviewed:    The following studies were reviewed today:   EKG:  EKG is ordered today.  The ekg ordered today demonstrates normal sinus rhythm with sinus arrhythmia, rate 75, no ST abnormalities  Recent Labs: 01/08/2020: TSH 3.91 08/13/2020: ALT 19; BUN 20; Creatinine, Ser 1.01; Hemoglobin 13.4; Platelets 306; Potassium 3.8; Sodium 137  Recent Lipid Panel    Component Value Date/Time   CHOL 103 09/10/2019 1035   TRIG 204.0 (H) 09/10/2019 1035   HDL 30.60 (L) 09/10/2019 1035   CHOLHDL 3 09/10/2019 1035   VLDL 40.8 (H) 09/10/2019 1035   LDLCALC 85 07/15/2012 0500   LDLDIRECT 43.0 09/10/2019 1035    Physical Exam:    VS:  There were no vitals taken for this visit.    Wt Readings from Last 3 Encounters:  09/30/20 201 lb (91.2 kg)  09/25/20 199 lb (90.3 kg)  09/02/20 201 lb (91.2 kg)     GEN:  in no acute distress HEENT: Normal NECK: No JVD; No carotid bruits CARDIAC: RRR, no murmurs, rubs, gallops RESPIRATORY:  Clear to auscultation without rales, wheezing or rhonchi  ABDOMEN: Soft, non-tender, non-distended MUSCULOSKELETAL:  No edema; No deformity  SKIN: Warm and dry NEUROLOGIC:  Alert and oriented x 3 PSYCHIATRIC:  Normal affect    ASSESSMENT:    No diagnosis found. PLAN:    Dyspnea: At initial clinic visit, reported dyspnea with minimal exertion.  Lexiscan Myoview was done on 07/22/2020, which showed fixed basal to mid anteroseptal defect likely representing artifact versus infarct, EF 49%.  Echocardiogram on 07/23/2020 showed LVEF 60 to 49%, grade 1 diastolic dysfunction, normal RV function, no significant valvular disease. -Given echo findings, suspect abnormality on Myoview represents artifact and no further cardiac work-up recommended  SVT: On diltiazem 240 mg daily and Toprol-XL 50 mg daily.  Reports no recent palpitations.  Hyperlipidemia: On atorvastatin 10 mg daily.  LDL 43 on 09/10/2019.  Hypertension: On losartan 25 mg daily, diltiazem 240 mg daily, and Toprol-XL 50 mg daily.  Normotensive in clinic today but reports occasional low BP.  Asked to check BP twice daily for next week and call with results  RTC in 1 year     Medication Adjustments/Labs and Tests Ordered: Current medicines are reviewed at length with the patient today.  Concerns regarding medicines are outlined above.  No orders of the defined types were placed in this encounter.  No orders of the defined types were placed in this encounter.   There are no Patient Instructions on file for this visit.   Signed, Donato Heinz, MD  10/15/2020 6:33 AM    [facility]

## 2020-10-21 DIAGNOSIS — M150 Primary generalized (osteo)arthritis: Secondary | ICD-10-CM | POA: Diagnosis not present

## 2020-10-21 DIAGNOSIS — R945 Abnormal results of liver function studies: Secondary | ICD-10-CM | POA: Diagnosis not present

## 2020-10-21 DIAGNOSIS — M65321 Trigger finger, right index finger: Secondary | ICD-10-CM | POA: Diagnosis not present

## 2020-10-21 DIAGNOSIS — M797 Fibromyalgia: Secondary | ICD-10-CM | POA: Diagnosis not present

## 2020-10-21 DIAGNOSIS — M1A09X0 Idiopathic chronic gout, multiple sites, without tophus (tophi): Secondary | ICD-10-CM | POA: Diagnosis not present

## 2020-10-21 DIAGNOSIS — M5136 Other intervertebral disc degeneration, lumbar region: Secondary | ICD-10-CM | POA: Diagnosis not present

## 2020-10-23 ENCOUNTER — Other Ambulatory Visit: Payer: Self-pay | Admitting: Internal Medicine

## 2020-10-31 DIAGNOSIS — R42 Dizziness and giddiness: Secondary | ICD-10-CM | POA: Diagnosis not present

## 2020-10-31 DIAGNOSIS — H838X3 Other specified diseases of inner ear, bilateral: Secondary | ICD-10-CM | POA: Diagnosis not present

## 2020-10-31 DIAGNOSIS — H9041 Sensorineural hearing loss, unilateral, right ear, with unrestricted hearing on the contralateral side: Secondary | ICD-10-CM | POA: Diagnosis not present

## 2020-10-31 DIAGNOSIS — H832X1 Labyrinthine dysfunction, right ear: Secondary | ICD-10-CM | POA: Diagnosis not present

## 2020-11-07 ENCOUNTER — Other Ambulatory Visit: Payer: Self-pay | Admitting: Internal Medicine

## 2020-11-17 DIAGNOSIS — R42 Dizziness and giddiness: Secondary | ICD-10-CM | POA: Diagnosis not present

## 2020-11-24 DIAGNOSIS — H832X3 Labyrinthine dysfunction, bilateral: Secondary | ICD-10-CM | POA: Diagnosis not present

## 2020-11-24 DIAGNOSIS — H903 Sensorineural hearing loss, bilateral: Secondary | ICD-10-CM | POA: Diagnosis not present

## 2020-11-24 DIAGNOSIS — R42 Dizziness and giddiness: Secondary | ICD-10-CM | POA: Diagnosis not present

## 2020-12-12 ENCOUNTER — Ambulatory Visit: Payer: Medicare Other | Attending: Otolaryngology | Admitting: Physical Therapy

## 2020-12-12 ENCOUNTER — Other Ambulatory Visit: Payer: Self-pay

## 2020-12-12 DIAGNOSIS — R2681 Unsteadiness on feet: Secondary | ICD-10-CM

## 2020-12-12 DIAGNOSIS — R2689 Other abnormalities of gait and mobility: Secondary | ICD-10-CM | POA: Diagnosis not present

## 2020-12-12 DIAGNOSIS — R42 Dizziness and giddiness: Secondary | ICD-10-CM | POA: Diagnosis not present

## 2020-12-12 DIAGNOSIS — H8111 Benign paroxysmal vertigo, right ear: Secondary | ICD-10-CM | POA: Diagnosis not present

## 2020-12-12 NOTE — Therapy (Signed)
[facility] [address] [place], [place], [address] Phone: [phone]   Fax:  [phone]  Physical Therapy Evaluation  Patient Details  Name: ORILLA TEMPLEMAN MRN: [id number] Date of Birth: [birth year] Referring Provider (PT): Leta Baptist, MD   Encounter Date: 12/12/2020   PT End of Session - 12/12/20 1355     Visit Number 1    Number of Visits 7    Date for PT Re-Evaluation 01/23/21    Authorization Type Medicare    PT Start Time 1355    PT Stop Time 1440    PT Time Calculation (min) 45 min    Activity Tolerance Patient tolerated treatment well    Behavior During Therapy [facility] for tasks assessed/performed             Past Medical History:  Diagnosis Date   Allergy    Environmental, foods: shellfish, lactose intolerant, citrus, peanuts   Asthma    mild persistent   Atrophic vaginitis    BRCA gene mutation negative 2015   testing done at [facility]   Diabetes mellitus without complication (HCC)    Type 2   DJD (degenerative joint disease)    generalized joint pain, including back   Dysrhythmia    SVT   Family history of malignant neoplasm of breast    Fibromyalgia    Dr Amil Amen   GERD (gastroesophageal reflux disease)    Gout    Idiopathic, chronic   Hiatal hernia    History of kidney stones    Hypertension    Palpitations    PSVT on Holter monitoring   Shortness of breath    occ   Stroke [facility])    TIA   SUI (stress urinary incontinence, female)    (resolved after surgery)   TIA (transient ischemic attack)    07/2012, see Dr Leonie Man   Vertigo     Past Surgical History:  Procedure Laterality Date   ABDOMINAL HYSTERECTOMY  1994   TAH,BSO-BLADDER [place] Breast Bx X2   CARPAL TUNNEL RELEASE Right 2005   CATARACT EXTRACTION, BILATERAL Bilateral 2016   Cataract    CHOLECYSTECTOMY, LAPAROSCOPIC  2001   [place]     bilatral knees;    KNEE ARTHROPLASTY  05/19/2012   Procedure: COMPUTER ASSISTED TOTAL KNEE ARTHROPLASTY;  Surgeon: Marybelle Killings, MD;  Location: [place];  Service: Orthopedics;  Laterality: Left;  Left  Total Knee Arthroplasty   KNEE ARTHROPLASTY Right 09/03/2013   Procedure: COMPUTER ASSISTED TOTAL KNEE ARTHROPLASTY;  Surgeon: Marybelle Killings, MD;  Location: [place];  Service: Orthopedics;  Laterality: Right;  Right Total Knee Arthroplasty, Computer Assist   KNEE ARTHROSCOPY     bil   LUMBAR LAMINECTOMY/DECOMPRESSION MICRODISCECTOMY N/A 08/18/2020   Procedure: L4-5 decompression, possible left L4-5 microdiscectomy;  Surgeon: Marybelle Killings, MD;  Location: [place];  Service: Orthopedics;  Laterality: N/A;   OOPHORECTOMY     BSO   SURGERY FOR POSERIOR TIBIAL TENDON  2003   TONSILLECTOMY     TRIPLE FUSION LEFT ANKLE Left 2007   TUBAL LIGATION      There were no vitals filed for this visit.    Subjective Assessment - 12/12/20 1359     Subjective Pt reports constant dizziness. First went to therapy and was diagnosed with BPPV. Went back to Dr. Benjamine Mola and  was diagnosed with Meniere's. Now her balance is thrown off every which way; before she would fall to the right. Pt states that her balance has not improved but PT did strengthen her so she can catch herself from falling. Has been to therapy twice -- first went to [facility] and then 2 years ago did it at Dr. Deeann [facility] clinic -- pt was given gaze stabilization exercises but reports has not been doing the exercises at home. Reports dizziness was consistent until past April when she fell backward while sitting ("it was almost like blacking out"); since then she has had increased episodes. BP meds adjusted and it is currently stable. Pt had back surgery in March. 8 or 9/10 dizziness today.    Pertinent History hx of TIA, fibromyalgia, SVT, back pain, 3/21 L4-5 decompression & L microdiscectomy    Limitations Walking;House hold  activities;Standing;Lifting    How long can you sit comfortably? n/a    How long can you stand comfortably? ~1.5 min before feeling too imbalanced    How long can you walk comfortably? Can walk further now because back is not hurting; can get around grocery store okay    Patient Stated Goals Maintain a comfortable 4/10 dizziness for most days; improve balance if able    Currently in Pain? No/denies                [facility] PT Assessment - 12/12/20 0001       Assessment   Medical Diagnosis Dizziness    Referring Provider (PT) Leta Baptist, MD    Onset Date/Surgical Date --   multiple years   Hand Dominance Right    Prior Therapy has seen PT x2 in the past for balance and dizziness      Precautions   Precautions None      Restrictions   Weight Bearing Restrictions No      Balance Screen   Has the patient fallen in the past 6 months No      [place] residence    Living Arrangements Spouse/significant other    Available Help at Discharge Family    Type of [place]      Prior Function   Level of [place] with basic ADLs      Cognition   Overall Cognitive Status Within Functional Limits for tasks assessed      Observation/Other Assessments   Focus on Therapeutic Outcomes (FOTO)  46      Transfers   Transfers Sit to Stand    Sit to Stand 4: Min guard    Comments For safety only after performing canalith repositioning      Ambulation/Gait   Ambulation Distance (Feet) 100 Feet    Assistive device Straight cane    Gait Pattern Step-through pattern;Decreased step length - right;Decreased step length - left;Right foot flat;Left foot flat;Lateral trunk lean to right;Lateral trunk lean to left    Ambulation Surface Level;Indoor                    Vestibular Assessment - 12/12/20 0001       Symptom Behavior   Subjective history of current problem Constant level of dizziness 4 to 8/10 dizziness. No issues with  neck pain -- stiffness every once in a while    Type of Dizziness  Imbalance;Unsteady with head/body turns;Lightheadedness    Frequency of Dizziness Constant level    Duration of Dizziness 1-2 hr  episodes   Symptom Nature Motion provoked;Constant    Aggravating Factors Turning head quickly;Turning body quickly;Forward bending;Sitting with head tilted back;Looking up to the ceiling;Rolling to right;Rolling to left   R rolling worse than L   Relieving Factors Closing eyes;Rest;Lying supine    Progression of Symptoms No change since onset    History of similar episodes Yes, multiple years but currently worse than usual because LOBs in different directions      Oculomotor Exam   Oculomotor Alignment Normal    Ocular ROM WFL    Spontaneous Absent    Gaze-induced  Absent    Head shaking Horizontal Absent    Head Shaking Vertical Absent    Smooth Pursuits Intact    Saccades Intact      Vestibulo-Ocular Reflex   VOR 1 Head Only (x 1 viewing) Normal    VOR 2 Head and Object (x 2 viewing) Increased dizziness; corrective saccades noted more with L head turn   worse up/down   VOR to Slow Head Movement Normal    VOR Cancellation Normal    Comment (+) HIT on R head turn      Positional Testing   Dix-Hallpike Dix-Hallpike Right;Dix-Hallpike Left    Sidelying Test Sidelying Right;Sidelying Left    Horizontal Canal Testing Horizontal Canal Right;Horizontal Canal Left      Dix-Hallpike Right   Dix-Hallpike Right Duration >2 min    Dix-Hallpike Right Symptoms Upbeat, right rotatory nystagmus   On first testing, upbeat R rotary with 0 latency; 2nd testing latency of ~20 sec, changed midway to pure upbeat nystagmus within 30 sec     Dix-Hallpike Left   Dix-Hallpike Left Duration 0    Dix-Hallpike Left Symptoms No nystagmus      Sidelying Right   Sidelying Right Duration >1 min    Sidelying Right Symptoms Upbeat, right rotatory nystagmus      Sidelying Left   Sidelying Left Duration 0       Horizontal Canal Right   Horizontal Canal Right Duration 0      Horizontal Canal Left   Horizontal Canal Left Duration 0                Objective measurements completed on examination: See above findings.        Vestibular Treatment/Exercise - 12/12/20 0001       Vestibular Treatment/Exercise   Vestibular Treatment Provided Canalith Repositioning    Canalith Repositioning Epley Manuever Right;Semont Procedure Right Posterior       EPLEY MANUEVER RIGHT   Number of Reps  2    Overall Response --    Response Details  2nd Dix-hallpike with increased latency of ~15-20 sec; nystagmus noted thereafter (see vestibular assessment)      Semont Procedure Right Posterior   Number of Reps  1    Overall Response  No change                   PT Education - 12/12/20 1548     Education Details Exam findings and POC    Person(s) Educated Patient    Methods Explanation;Demonstration;Verbal cues;Handout    Comprehension Verbalized understanding;Returned demonstration;Verbal cues required;Tactile cues required              PT Short Term Goals - 12/12/20 1600       PT SHORT TERM GOAL #1   Title PT will assess balance and create appropriate goal    Time 2    Period Weeks  Status New    Target Date 12/26/20               PT Long Term Goals - 12/12/20 1557       PT LONG TERM GOAL #1   Title Pt will be independent with her HEP and be consistent with performing them    Time 6    Period Weeks    Status New    Target Date 01/23/21      PT LONG TERM GOAL #2   Title Pt will report decrease in her constant dizzines to </=5/10 at 5 days in the week    Baseline 7 or 8 most days    Time 6    Period Weeks    Status New    Target Date 01/23/21      PT LONG TERM GOAL #3   Title Pt will demo no nystagmus with positional testing to indicate resolution of BPPV    Time 6    Period Weeks    Status New    Target Date 01/23/21      PT LONG TERM GOAL #4    Title Pt will have improved FOTO score to at least 56    Baseline 46    Time 6    Period Weeks    Status New    Target Date 01/23/21                    Plan - 12/12/20 1503     Clinical Impression Statement Mrs. Loeza is a 75 y/o F presenting to OPPT due to complaint of increasing LOB and dizziness. Pt has had chronic dizziness for over multiple years - has had BPPV and Meniere's in the past. Pt with exacerbation of symptoms in April. On assessment, pt demos (+) R posterior BPPV during positional testing with non fatiguing nystagmus even after 2 minutes. Additionally, pt with poor gaze stabilization with R head turns during HIT indicating s/s of R vestibular hypofunction. Did not attempt balance testing this session due to pt's increased dizziness from her normal baseline. Provided home Semont maneuver for her to try again before next session. Pt would benefit from PT to decrease her overall dizziness at a more functional level for increased balance and safety with home and community mobility.    Personal Factors and Comorbidities Age;Time since onset of injury/illness/exacerbation;Comorbidity 1;Comorbidity 2;Comorbidity 3+    Comorbidities hx of TIA, fibromyalgia, SVT, back pain, 3/21 L4-5 decompression & L microdiscectomy, Meniere's in R ear, BPPV in R ear    Examination-Activity Limitations Squat;Stairs;Stand;Locomotion Level;Transfers;Bed Mobility;Bend    Examination-Participation Restrictions Community Activity;Cleaning;Meal Prep;Shop;Yard Work;Driving    Stability/Clinical Decision Making Evolving/Moderate complexity    Clinical Decision Making Moderate    Rehab Potential Fair    PT Frequency 1x / week    PT Duration 6 weeks    PT Treatment/Interventions ADLs/Self Care Home Management;Aquatic Therapy;Canalith Repostioning;Electrical Stimulation;Cryotherapy;Moist Heat;Ultrasound;DME Instruction;Gait training;Stair training;Functional mobility training;Therapeutic  activities;Therapeutic exercise;Balance training;Neuromuscular re-education;Manual techniques;Patient/family education;Energy conservation;Vestibular;Taping;Spinal Manipulations    PT Next Visit Plan Were you able to try Semont maneuver? Retest canals and treat accordingly. Perform DGI, consider mCTSIB. Initiate gaze stabilization with bias for R ear    PT Home Exercise Plan Home Semont Maneuver    Consulted and Agree with Plan of Care Patient             Patient will benefit from skilled therapeutic intervention in order to improve the following deficits and impairments:  Abnormal gait, Difficulty  walking, Dizziness, Decreased activity tolerance, Decreased balance, Decreased mobility, Decreased strength  Visit Diagnosis: Dizziness and giddiness  Unsteadiness on feet  Other abnormalities of gait and mobility  BPPV (benign paroxysmal positional vertigo), right     Problem List Patient Active Problem List   Diagnosis Date Noted   Status post lumbar spine surgery for decompression of spinal cord 08/26/2020   Lumbar disc herniation 05/28/2020   Spinal stenosis of lumbar region 05/28/2020   H/O vitamin D deficiency 09/21/2018   Elevated LFTs 12/25/2015   Dizzy 07/31/2015   PCP  notes >>>>>>>>>>>>>>>> 02/11/2015   Hyperparathyroidism ([place]) 06/07/2014   Benign paroxysmal positional vertigo 02/19/2014   FHx: BRCA2 gene positive    Family history of malignant neoplasm of breast    Family history of breast cancer 01/10/2014   Tachycardia 11/19/2013   Elevated LDL cholesterol level 06/11/2013   Obesity, Class I, BMI 30.0-34.9 (see actual BMI) 06/11/2013   Osteopenia 06/06/2013   Menopause 01/09/2013   History of TIA (transient ischemic attack) 07/15/2012   Annual physical exam 05/06/2011   SUI (stress urinary incontinence, female)    Atrophic vaginitis    GOUT, UNSPECIFIED 04/28/2010   Diabetes ([place]) 04/28/2010   Meniere's disease 07/30/2008   Hearing loss 07/30/2008    Essential hypertension 08/19/2007   ALLERGIC RHINITIS 08/19/2007   ASTHMA 08/19/2007   ACID REFLUX DISEASE 08/19/2007   IRRITABLE BOWEL SYNDROME 08/19/2007   DEGENERATIVE [place] DISEASE 08/19/2007   FIBROMYALGIA 08/19/2007   FATIGUE, CHRONIC 08/19/2007    [place] April Ma L Patrycja Mumpower PT, DPT 12/12/2020, 4:07 PM  [facility] [address] [place], [place], [address] Phone: [phone]   Fax:  [phone]  Name: CICELY ORTNER MRN: [id number] Date of Birth: 03-Aug-1945

## 2020-12-18 ENCOUNTER — Other Ambulatory Visit: Payer: Self-pay

## 2020-12-18 ENCOUNTER — Encounter: Payer: Self-pay | Admitting: Physical Therapy

## 2020-12-18 ENCOUNTER — Ambulatory Visit: Payer: Medicare Other | Admitting: Physical Therapy

## 2020-12-18 DIAGNOSIS — H8111 Benign paroxysmal vertigo, right ear: Secondary | ICD-10-CM

## 2020-12-18 DIAGNOSIS — R2681 Unsteadiness on feet: Secondary | ICD-10-CM | POA: Diagnosis not present

## 2020-12-18 DIAGNOSIS — R42 Dizziness and giddiness: Secondary | ICD-10-CM | POA: Diagnosis not present

## 2020-12-18 DIAGNOSIS — R2689 Other abnormalities of gait and mobility: Secondary | ICD-10-CM

## 2020-12-18 NOTE — Therapy (Addendum)
[facility] [address] [place], [place], [address] Phone: [phone]   Fax:  [phone]  Physical Therapy Treatment  Patient Details  Name: Kristy [name] MRN: [id number] Date of Birth: 10-22-1945 Referring Provider (PT): Leta Baptist, MD   Encounter Date: 12/18/2020   PT End of Session - 12/18/20 1206     Visit Number 2    Number of Visits 7    Date for PT Re-Evaluation 01/23/21    Authorization Type Medicare    PT Start Time 1101    PT Stop Time 1147    PT Time Calculation (min) 46 min    Activity Tolerance Patient tolerated treatment well    Behavior During Therapy [facility] for tasks assessed/performed             Past Medical History:  Diagnosis Date   Allergy    Environmental, foods: shellfish, lactose intolerant, citrus, peanuts   Asthma    mild persistent   Atrophic vaginitis    BRCA gene mutation negative 2015   testing done at [place]   Diabetes mellitus without complication (HCC)    Type 2   DJD (degenerative joint disease)    generalized joint pain, including back   Dysrhythmia    SVT   Family history of malignant neoplasm of breast    Fibromyalgia    Dr Amil Amen   GERD (gastroesophageal reflux disease)    Gout    Idiopathic, chronic   Hiatal hernia    History of kidney stones    Hypertension    Palpitations    PSVT on Holter monitoring   Shortness of breath    occ   Stroke [facility])    TIA   SUI (stress urinary incontinence, female)    (resolved after surgery)   TIA (transient ischemic attack)    07/2012, see Dr Leonie Man   Vertigo     Past Surgical History:  Procedure Laterality Date   ABDOMINAL HYSTERECTOMY  1994   TAH,BSO-BLADDER [place] Breast Bx X2   CARPAL TUNNEL RELEASE Right 2005   CATARACT EXTRACTION, BILATERAL Bilateral 2016   Cataract    CHOLECYSTECTOMY, LAPAROSCOPIC  2001   [place]     bilatral knees;    KNEE ARTHROPLASTY  05/19/2012   Procedure: COMPUTER ASSISTED TOTAL KNEE ARTHROPLASTY;  Surgeon: Marybelle Killings, MD;  Location: [facility];  Service: Orthopedics;  Laterality: Left;  Left  Total Knee Arthroplasty   KNEE ARTHROPLASTY Right 09/03/2013   Procedure: COMPUTER ASSISTED TOTAL KNEE ARTHROPLASTY;  Surgeon: Marybelle Killings, MD;  Location: [facility];  Service: Orthopedics;  Laterality: Right;  Right Total Knee Arthroplasty, Computer Assist   KNEE ARTHROSCOPY     bil   LUMBAR LAMINECTOMY/DECOMPRESSION MICRODISCECTOMY N/A 08/18/2020   Procedure: L4-5 decompression, possible left L4-5 microdiscectomy;  Surgeon: Marybelle Killings, MD;  Location: [facility];  Service: Orthopedics;  Laterality: N/A;   OOPHORECTOMY     BSO   SURGERY FOR POSERIOR TIBIAL TENDON  2003   TONSILLECTOMY     TRIPLE FUSION LEFT ANKLE Left 2007   TUBAL LIGATION      There were no vitals filed for this visit.   Subjective Assessment - 12/18/20 1049     Subjective Pt states she did the Semont maneuver at home and it did provoke dizziness.  Pt states she gets more  dizzy when she turns onto Rt side when she is in bed; diagnosed with Meniere's disease 2 years ago.  Reports a little fullness in Rt ear at this time. States this is "a good morning" - walked to the car without her cane. States when the dizziness is really bad she uses her RW - states worse episode was in May after her back surgery.    Pertinent History hx of TIA, fibromyalgia, SVT, back pain, 3/21 L4-5 decompression & L microdiscectomy    Limitations Walking;House hold activities;Standing;Lifting    How long can you sit comfortably? n/a    How long can you stand comfortably? ~1.5 min before feeling too imbalanced    How long can you walk comfortably? Can walk further now because back is not hurting; can get around grocery store okay    Patient Stated Goals Maintain a comfortable 4/10 dizziness for most days; improve balance if able     Currently in Pain? No/denies             MCTSIB test:  1)EO feet apart 30 secs 2) EC  feet apart 2.9 secs 3)Feet together with EO 18.53 4)Unable to stand with feet together EC      Vestibular Assessment - 12/18/20 0001       Positional Testing   Dix-Hallpike Dix-Hallpike Right;Dix-Hallpike Left    Sidelying Test Sidelying Right;Sidelying Left      Dix-Hallpike Right   Dix-Hallpike Right Duration approx. 5 secs on 1st rep of Epley    Dix-Hallpike Right Symptoms Upbeat, right rotatory nystagmus      Dix-Hallpike Left   Dix-Hallpike Left Duration none    Dix-Hallpike Left Symptoms No nystagmus      Sidelying Right   Sidelying Right Duration none    Sidelying Right Symptoms No nystagmus      Sidelying Left   Sidelying Left Duration none    Sidelying Left Symptoms No nystagmus              Epley maneuver for Rt BPPV - 2 reps ; no nystagmus and no c/o vertigo on 2nd rep        [facility] Adult PT Treatment/Exercise - 12/18/20 0001       Standardized Balance Assessment   Standardized Balance Assessment Dynamic Gait Index      Dynamic Gait Index   Level Surface Normal    Change in Gait Speed Normal    Gait with Horizontal Head Turns Moderate Impairment    Gait with Vertical Head Turns Mild Impairment    Gait and Pivot Turn Normal    Step Over Obstacle Normal    Step Around Obstacles Normal    Steps Mild Impairment    Total Score 20                 Balance Exercises - 12/18/20 0001       Balance Exercises: Standing   Standing Eyes Closed Wide (BOA);Solid surface;5 reps;Head turns   feet approx. 4" apart; horizontal and vertical 5 reps each              PT Education - 12/18/20 1205     Education Details instructed pt in standing on floor with EC for HEP; pt states she is already doing x1 viewing exercise from previous vestibular PT 2 yrs ago;  pt was given handout on Meniere's disease and also on diet recommendations for Hydrops &  Meniere's from [employer]) Educated Patient    Methods Explanation;Demonstration;Handout  Comprehension Verbalized understanding;Returned demonstration              PT Short Term Goals - 12/18/20 1216       PT SHORT TERM GOAL #1   Title PT will assess balance and create appropriate goal    Time 2    Period Weeks    Status New    Target Date 12/26/20               PT Long Term Goals - 12/18/20 1216       PT LONG TERM GOAL #1   Title Pt will be independent with her HEP and be consistent with performing them    Time 6    Period Weeks    Status New      PT LONG TERM GOAL #2   Title Pt will report decrease in her constant dizzines to </=5/10 at 5 days in the week    Baseline 7 or 8 most days    Time 6    Period Weeks    Status New      PT LONG TERM GOAL #3   Title Pt will demo no nystagmus with positional testing to indicate resolution of BPPV    Time 6    Period Weeks    Status New      PT LONG TERM GOAL #4   Title Pt will have improved FOTO score to at least 56    Baseline 46    Time 6    Period Weeks    Status New                   Plan - 12/18/20 1110     Clinical Impression Statement Pt had minimal low intensity Rt rotary upbeating nystagmus in Rt Dix-Hallpike test position and even less in initial position of Epley - pt stated today "is a good day".  2nd rep of Epley was performed and no nystagmus and no c/o vertigo was reported in any position.  Rt BPPV appears to be resolved at this time.  Pt's score on DGI is 20/24 - not indicative of fall risk - pt does state that steadiness with amb. varies depending on dizziness secondary to Meniere's disease.  Pt was unable to maintain balance on foam with EC - due to vestibular hypofunction due to Meniere's.  Cont with POC.    Personal Factors and Comorbidities Age;Time since onset of injury/illness/exacerbation;Comorbidity 1;Comorbidity 2;Comorbidity 3+    Comorbidities hx of TIA, fibromyalgia,  SVT, back pain, 3/21 L4-5 decompression & L microdiscectomy, Meniere's in R ear, BPPV in R ear    Examination-Activity Limitations Squat;Stairs;Stand;Locomotion Level;Transfers;Bed Mobility;Bend    Examination-Participation Restrictions Community Activity;Cleaning;Meal Prep;Shop;Yard Work;Driving    Stability/Clinical Decision Making Evolving/Moderate complexity    Rehab Potential Fair    PT Frequency 1x / week    PT Duration 6 weeks    PT Treatment/Interventions ADLs/Self Care Home Management;Aquatic Therapy;Canalith Repostioning;Electrical Stimulation;Cryotherapy;Moist Heat;Ultrasound;DME Instruction;Gait training;Stair training;Functional mobility training;Therapeutic activities;Therapeutic exercise;Balance training;Neuromuscular re-education;Manual techniques;Patient/family education;Energy conservation;Vestibular;Taping;Spinal Manipulations    PT Next Visit Plan Monitor BPPV prn (resolved on 12-18-20); check balance with EC on floor issued for HEP - cont vestibular/balance/gait exercises    PT Home Exercise Plan Home Semont Maneuver;   12-18-20 - pt states she is already doing gaze stabilization exercise; issued balance on floor with EC for HEP    Consulted and Agree with Plan of Care Patient  Patient will benefit from skilled therapeutic intervention in order to improve the following deficits and impairments:  Abnormal gait, Difficulty walking, Dizziness, Decreased activity tolerance, Decreased balance, Decreased mobility, Decreased strength  Visit Diagnosis: Unsteadiness on feet  Other abnormalities of gait and mobility  BPPV (benign paroxysmal positional vertigo), right     Problem List Patient Active Problem List   Diagnosis Date Noted   Status post lumbar spine surgery for decompression of spinal cord 08/26/2020   Lumbar disc herniation 05/28/2020   Spinal stenosis of lumbar region 05/28/2020   H/O vitamin D deficiency 09/21/2018   Elevated LFTs 12/25/2015    Dizzy 07/31/2015   PCP  notes >>>>>>>>>>>>>>>> 02/11/2015   Hyperparathyroidism ([place]) 06/07/2014   Benign paroxysmal positional vertigo 02/19/2014   FHx: BRCA2 gene positive    Family history of malignant neoplasm of breast    Family history of breast cancer 01/10/2014   Tachycardia 11/19/2013   Elevated LDL cholesterol level 06/11/2013   Obesity, Class I, BMI 30.0-34.9 (see actual BMI) 06/11/2013   Osteopenia 06/06/2013   Menopause 01/09/2013   History of TIA (transient ischemic attack) 07/15/2012   Annual physical exam 05/06/2011   SUI (stress urinary incontinence, female)    Atrophic vaginitis    GOUT, UNSPECIFIED 04/28/2010   Diabetes ([place]) 04/28/2010   Meniere's disease 07/30/2008   Hearing loss 07/30/2008   Essential hypertension 08/19/2007   ALLERGIC RHINITIS 08/19/2007   ASTHMA 08/19/2007   ACID REFLUX DISEASE 08/19/2007   IRRITABLE BOWEL SYNDROME 08/19/2007   DEGENERATIVE [place] DISEASE 08/19/2007   FIBROMYALGIA 08/19/2007   FATIGUE, CHRONIC 08/19/2007    Alda Lea, PT 12/18/2020, 12:22 PM  [facility] [address] [place], [place], [address] Phone: [phone]   Fax:  [phone]  Name: LAMIYA NAAS MRN: [id number] Date of Birth: 22-May-1946

## 2020-12-18 NOTE — Patient Instructions (Signed)
Balance: Neck Rotation, Eyes Closed - Bilateral (Varied Surfaces)    Stand, feet shoulder width, close eyes. Balance and turn head and neck from side to side. Repeat ___2_ times per set. Do 2 times/day.   1) hold 10 secs with eyes closed  2) add head turns side to side 5 times; up/down 5 times  with eyes closed   DO this exercise in a corner - place chair in front for safety

## 2020-12-23 ENCOUNTER — Ambulatory Visit: Payer: Medicare Other | Attending: Critical Care Medicine

## 2020-12-23 DIAGNOSIS — Z20822 Contact with and (suspected) exposure to covid-19: Secondary | ICD-10-CM

## 2020-12-24 LAB — NOVEL CORONAVIRUS, NAA: SARS-CoV-2, NAA: DETECTED — AB

## 2020-12-24 LAB — SARS-COV-2, NAA 2 DAY TAT

## 2020-12-26 ENCOUNTER — Other Ambulatory Visit: Payer: Self-pay | Admitting: Internal Medicine

## 2020-12-26 ENCOUNTER — Encounter: Payer: Medicare Other | Admitting: Physical Therapy

## 2020-12-31 ENCOUNTER — Ambulatory Visit: Payer: Medicare Other | Admitting: Physical Therapy

## 2021-01-07 ENCOUNTER — Other Ambulatory Visit: Payer: Self-pay

## 2021-01-07 ENCOUNTER — Ambulatory Visit: Payer: Medicare Other | Attending: Otolaryngology | Admitting: Physical Therapy

## 2021-01-07 DIAGNOSIS — R42 Dizziness and giddiness: Secondary | ICD-10-CM | POA: Diagnosis not present

## 2021-01-07 DIAGNOSIS — R2689 Other abnormalities of gait and mobility: Secondary | ICD-10-CM | POA: Diagnosis not present

## 2021-01-07 DIAGNOSIS — H8111 Benign paroxysmal vertigo, right ear: Secondary | ICD-10-CM | POA: Diagnosis not present

## 2021-01-07 DIAGNOSIS — R2681 Unsteadiness on feet: Secondary | ICD-10-CM | POA: Diagnosis not present

## 2021-01-07 NOTE — Therapy (Signed)
[facility] [address] [place], [place], [address] Phone: [phone]   Fax:  [phone]  Physical Therapy Treatment and Discharge  Patient Details  Name: Kristy [name] MRN: [id number] Date of Birth: 01-15-1946 Referring Provider (PT): Leta Baptist, MD   PHYSICAL THERAPY DISCHARGE SUMMARY  Visits from Start of Care: 3  Current functional level related to goals / functional outcomes: Has met her goals   Remaining deficits: Continues to have difficulty maintaining balance with eyes closed on compliant surface   Education / Equipment: See below   Patient agrees to discharge. Patient goals were met. Patient is being discharged due to meeting the stated rehab goals.    Encounter Date: 01/07/2021   PT End of Session - 01/07/21 1314     Visit Number 3    Number of Visits 7    Date for PT Re-Evaluation 01/23/21    Authorization Type Medicare    PT Start Time 4356    PT Stop Time 8616    PT Time Calculation (min) 31 min    Activity Tolerance Patient tolerated treatment well    Behavior During Therapy WFL for tasks assessed/performed             Past Medical History:  Diagnosis Date   Allergy    Environmental, foods: shellfish, lactose intolerant, citrus, peanuts   Asthma    mild persistent   Atrophic vaginitis    BRCA gene mutation negative 2015   testing done at [facility]   Diabetes mellitus without complication (HCC)    Type 2   DJD (degenerative joint disease)    generalized joint pain, including back   Dysrhythmia    SVT   Family history of malignant neoplasm of breast    Fibromyalgia    Dr Amil Amen   GERD (gastroesophageal reflux disease)    Gout    Idiopathic, chronic   Hiatal hernia    History of kidney stones    Hypertension    Palpitations    PSVT on Holter monitoring   Shortness of breath    occ   Stroke ([place])    TIA   SUI (stress urinary incontinence, female)    (resolved  after surgery)   TIA (transient ischemic attack)    07/2012, see Dr Leonie Man   Vertigo     Past Surgical History:  Procedure Laterality Date   ABDOMINAL HYSTERECTOMY  1994   TAH,BSO-BLADDER NECK SUSPENSION   BREAST SURGERY     LUMPECTOMY-BENIGN Breast Bx X2   CARPAL TUNNEL RELEASE Right 2005   CATARACT EXTRACTION, BILATERAL Bilateral 2016   Cataract    CHOLECYSTECTOMY, LAPAROSCOPIC  2001   CYSTOCELE REPAIR  1994   EYE SURGERY     JOINT REPLACEMENT     bilatral knees;    KNEE ARTHROPLASTY  05/19/2012   Procedure: COMPUTER ASSISTED TOTAL KNEE ARTHROPLASTY;  Surgeon: Marybelle Killings, MD;  Location: [facility];  Service: Orthopedics;  Laterality: Left;  Left  Total Knee Arthroplasty   KNEE ARTHROPLASTY Right 09/03/2013   Procedure: COMPUTER ASSISTED TOTAL KNEE ARTHROPLASTY;  Surgeon: Marybelle Killings, MD;  Location: [facility];  Service: Orthopedics;  Laterality: Right;  Right Total Knee Arthroplasty, Computer Assist   KNEE ARTHROSCOPY     bil   LUMBAR LAMINECTOMY/DECOMPRESSION MICRODISCECTOMY N/A 08/18/2020   Procedure: L4-5 decompression, possible left L4-5 microdiscectomy;  Surgeon: Marybelle Killings, MD;  Location: [facility];  Service: Orthopedics;  Laterality: N/A;   OOPHORECTOMY  BSO   SURGERY FOR POSERIOR TIBIAL TENDON  2003   TONSILLECTOMY     TRIPLE FUSION LEFT ANKLE Left 2007   TUBAL LIGATION      There were no vitals filed for this visit.   Subjective Assessment - 01/07/21 1318     Subjective Pt states she recovered well after COVID. Pt reports her dizziness is pretty good. No bad episodes since coming. 3/10 dizziness. At worst has been not even a 5/10 when getting up. Pt states she has been standing and doing gaze stabilization but has not done eyes closed.    Pertinent History hx of TIA, fibromyalgia, SVT, back pain, 3/21 L4-5 decompression & L microdiscectomy    Limitations Walking;House hold activities;Standing;Lifting    How long can you sit comfortably? n/a    How long can you stand  comfortably? ~1.5 min before feeling too imbalanced    How long can you walk comfortably? Can walk further now because back is not hurting; can get around grocery store okay    Patient Stated Goals Maintain a comfortable 4/10 dizziness for most days; improve balance if able    Currently in Pain? No/denies                     Vestibular Assessment - 01/07/21 0001       Dix-Hallpike Right   Dix-Hallpike Right Duration none    Dix-Hallpike Right Symptoms No nystagmus      Dix-Hallpike Left   Dix-Hallpike Left Duration none    Dix-Hallpike Left Symptoms No nystagmus                           Balance Exercises - 01/07/21 0001       Balance Exercises: Standing   Standing Eyes Opened Narrow base of support (BOS);Head turns;Foam/compliant surface   2x10 head turns and then head nods   Standing Eyes Closed Narrow base of support (BOS);Solid surface;Head turns   x30 sec head nods & then head turns; wide BOS on foam EC 2x60 sec, on pillow 2x30 sec   Partial Tandem Stance Eyes open;30 secs;2 reps                 PT Short Term Goals - 12/18/20 1216       PT SHORT TERM GOAL #1   Title PT will assess balance and create appropriate goal    Time 2    Period Weeks    Status New    Target Date 12/26/20               PT Long Term Goals - 01/07/21 1353       PT LONG TERM GOAL #1   Title Pt will be independent with her HEP and be consistent with performing them    Time 6    Period Weeks    Status Achieved      PT LONG TERM GOAL #2   Title Pt will report decrease in her constant dizzines to </=5/10 at 5 days in the week    Baseline 7 or 8 most days; at worst has been 4 to 5/10 in the last few weeks    Time 6    Period Weeks    Status Achieved      PT LONG TERM GOAL #3   Title Pt will demo no nystagmus with positional testing to indicate resolution of BPPV    Time 6    Period  Weeks    Status Achieved      PT LONG TERM GOAL #4   Title Pt  will have improved FOTO score to at least 56    Baseline 46    Time 6    Period Weeks    Status Achieved                   Plan - 01/07/21 1345     Clinical Impression Statement Pt with no s/s of BPPV this session. Pt has been doing well and meeting her LTGs. Pt continues to demo decreased balance with poor vestibular sensory integration -- discussed performing these exercises at home. Pt with improved FOTO score and she feels ready for PT d/c.    Personal Factors and Comorbidities Age;Time since onset of injury/illness/exacerbation;Comorbidity 1;Comorbidity 2;Comorbidity 3+    Comorbidities hx of TIA, fibromyalgia, SVT, back pain, 3/21 L4-5 decompression & L microdiscectomy, Meniere's in R ear, BPPV in R ear    Examination-Activity Limitations Squat;Stairs;Stand;Locomotion Level;Transfers;Bed Mobility;Bend    Examination-Participation Restrictions Community Activity;Cleaning;Meal Prep;Shop;Yard Work;Driving    Stability/Clinical Decision Making Evolving/Moderate complexity    Rehab Potential Fair    PT Frequency 1x / week    PT Duration 6 weeks    PT Treatment/Interventions ADLs/Self Care Home Management;Aquatic Therapy;Canalith Repostioning;Electrical Stimulation;Cryotherapy;Moist Heat;Ultrasound;DME Instruction;Gait training;Stair training;Functional mobility training;Therapeutic activities;Therapeutic exercise;Balance training;Neuromuscular re-education;Manual techniques;Patient/family education;Energy conservation;Vestibular;Taping;Spinal Manipulations    PT Next Visit Plan D/C to balance HEP    PT Home Exercise Plan Home Semont Maneuver;   12-18-20 - pt states she is already doing gaze stabilization exercise; issued balance on floor with EC for HEP    Consulted and Agree with Plan of Care Patient             Patient will benefit from skilled therapeutic intervention in order to improve the following deficits and impairments:  Abnormal gait, Difficulty walking, Dizziness,  Decreased activity tolerance, Decreased balance, Decreased mobility, Decreased strength  Visit Diagnosis: Unsteadiness on feet  Other abnormalities of gait and mobility  BPPV (benign paroxysmal positional vertigo), right  Dizziness and giddiness     Problem List Patient Active Problem List   Diagnosis Date Noted   Status post lumbar spine surgery for decompression of spinal cord 08/26/2020   Lumbar disc herniation 05/28/2020   Spinal stenosis of lumbar region 05/28/2020   H/O vitamin D deficiency 09/21/2018   Elevated LFTs 12/25/2015   Dizzy 07/31/2015   PCP  notes >>>>>>>>>>>>>>>> 02/11/2015   Hyperparathyroidism ([place]) 06/07/2014   Benign paroxysmal positional vertigo 02/19/2014   FHx: BRCA2 gene positive    Family history of malignant neoplasm of breast    Family history of breast cancer 01/10/2014   Tachycardia 11/19/2013   Elevated LDL cholesterol level 06/11/2013   Obesity, Class I, BMI 30.0-34.9 (see actual BMI) 06/11/2013   Osteopenia 06/06/2013   Menopause 01/09/2013   History of TIA (transient ischemic attack) 07/15/2012   Annual physical exam 05/06/2011   SUI (stress urinary incontinence, female)    Atrophic vaginitis    GOUT, UNSPECIFIED 04/28/2010   Diabetes ([place]) 04/28/2010   Meniere's disease 07/30/2008   Hearing loss 07/30/2008   Essential hypertension 08/19/2007   ALLERGIC RHINITIS 08/19/2007   ASTHMA 08/19/2007   ACID REFLUX DISEASE 08/19/2007   IRRITABLE BOWEL SYNDROME 08/19/2007   DEGENERATIVE [place] DISEASE 08/19/2007   FIBROMYALGIA 08/19/2007   FATIGUE, CHRONIC 08/19/2007    Lakya Schrupp April Ma L Thaily Hackworth PT, DPT 01/07/2021, 1:55 PM  [facility]  [address] [place], [place], [address] Phone: [phone]   Fax:  [phone]  Name: YIZEL CANBY MRN: [id number] Date of Birth: 12/21/1945

## 2021-01-09 ENCOUNTER — Ambulatory Visit (INDEPENDENT_AMBULATORY_CARE_PROVIDER_SITE_OTHER): Payer: Medicare Other | Admitting: Internal Medicine

## 2021-01-09 ENCOUNTER — Other Ambulatory Visit: Payer: Self-pay

## 2021-01-09 ENCOUNTER — Encounter: Payer: Self-pay | Admitting: Internal Medicine

## 2021-01-09 VITALS — BP 120/68 | HR 93 | Temp 98.1°F | Resp 16 | Ht 65.0 in | Wt 210.5 lb

## 2021-01-09 DIAGNOSIS — Z7185 Encounter for immunization safety counseling: Secondary | ICD-10-CM | POA: Diagnosis not present

## 2021-01-09 DIAGNOSIS — R0600 Dyspnea, unspecified: Secondary | ICD-10-CM | POA: Diagnosis not present

## 2021-01-09 DIAGNOSIS — I10 Essential (primary) hypertension: Secondary | ICD-10-CM | POA: Diagnosis not present

## 2021-01-09 DIAGNOSIS — E7800 Pure hypercholesterolemia, unspecified: Secondary | ICD-10-CM | POA: Diagnosis not present

## 2021-01-09 DIAGNOSIS — E1169 Type 2 diabetes mellitus with other specified complication: Secondary | ICD-10-CM

## 2021-01-09 DIAGNOSIS — R0609 Other forms of dyspnea: Secondary | ICD-10-CM

## 2021-01-09 LAB — LIPID PANEL
Cholesterol: 117 mg/dL (ref 0–200)
HDL: 41.4 mg/dL (ref >39.00)
NonHDL: 75.13
Total CHOL/HDL Ratio: 3
Triglycerides: 234 mg/dL — ABNORMAL HIGH (ref 0.0–149.0)
VLDL: 46.8 mg/dL — ABNORMAL HIGH (ref 0.0–40.0)

## 2021-01-09 LAB — BASIC METABOLIC PANEL
BUN: 21 mg/dL (ref 6–23)
CO2: 26 mEq/L (ref 19–32)
Calcium: 10.3 mg/dL (ref 8.4–10.5)
Chloride: 104 mEq/L (ref 96–112)
Creatinine, Ser: 0.91 mg/dL (ref 0.40–1.20)
GFR: 61.44 mL/min (ref >60.00)
Glucose, Bld: 109 mg/dL — ABNORMAL HIGH (ref 70–99)
Potassium: 4.8 mEq/L (ref 3.5–5.1)
Sodium: 139 mEq/L (ref 135–145)

## 2021-01-09 LAB — HEMOGLOBIN A1C: Hgb A1c MFr Bld: 6.9 % — ABNORMAL HIGH (ref 4.6–6.5)

## 2021-01-09 LAB — LDL CHOLESTEROL, DIRECT: Direct LDL: 48 mg/dL

## 2021-01-09 MED ORDER — METFORMIN HCL 850 MG PO TABS: 850.0000 mg | ORAL_TABLET | Freq: Three times a day (TID) | ORAL | Status: DC

## 2021-01-09 NOTE — Patient Instructions (Addendum)
You are overdue for your recall colonoscopy with Dr. Loletha Carrow. Please call [facility] at [phone] to get on their schedule.  Per our records you are due for your diabetic eye exam. Please contact your eye doctor to schedule an appointment. Please have them send copies of your office visit notes to [place]. Our fax number is [phone]. If you need a referral to an eye doctor please let us know.   Recommend the following vaccinations at your pharmacy: COVID #4  Shingrix Tdap (Tetanus)   Check the  blood pressure   BP GOAL is between 110/65 and  135/85. If it is consistently higher or lower, let me know    GO TO THE LAB : Get the blood work     [place], [place] back for a checkup in 5 to 6 months

## 2021-01-09 NOTE — Progress Notes (Signed)
Subjective:    Patient ID: Kristy [name], female    DOB: March 01, 1946, 75 y.o.   MRN: [id number]  DOS:  01/09/2021 Type of visit - description: Follow-up  Today with talk about diabetes, hypertension, high cholesterol. Also DJD and preventive care.  Since the last visit saw cardiology, chart reviewed. Had back surgery, recuperating well.   Review of Systems Denies cough or wheezing, asthma well-controlled No chest pain, lower extremity edema. Admits her diet has not been good, eating too many sweets.   Past Medical History:  Diagnosis Date   Allergy    Environmental, foods: shellfish, lactose intolerant, citrus, peanuts   Asthma    mild persistent   Atrophic vaginitis    BRCA gene mutation negative 2015   testing done at [facility]   Diabetes mellitus without complication (HCC)    Type 2   DJD (degenerative joint disease)    generalized joint pain, including back   Dysrhythmia    SVT   Family history of malignant neoplasm of breast    Fibromyalgia    Dr Amil Amen   GERD (gastroesophageal reflux disease)    Gout    Idiopathic, chronic   Hiatal hernia    History of kidney stones    Hypertension    Palpitations    PSVT on Holter monitoring   Shortness of breath    occ   Stroke [facility])    TIA   SUI (stress urinary incontinence, female)    (resolved after surgery)   TIA (transient ischemic attack)    07/2012, see Dr Leonie Man   Vertigo     Past Surgical History:  Procedure Laterality Date   ABDOMINAL HYSTERECTOMY  1994   TAH,BSO-BLADDER NECK SUSPENSION   BREAST SURGERY     LUMPECTOMY-BENIGN Breast Bx X2   CARPAL TUNNEL RELEASE Right 2005   CATARACT EXTRACTION, BILATERAL Bilateral 2016   Cataract    CHOLECYSTECTOMY, LAPAROSCOPIC  2001   CYSTOCELE REPAIR  1994   EYE SURGERY     JOINT REPLACEMENT     bilatral knees;    KNEE ARTHROPLASTY  05/19/2012   Procedure: COMPUTER ASSISTED TOTAL KNEE ARTHROPLASTY;  Surgeon: Marybelle Killings, MD;  Location: [place];  Service:  Orthopedics;  Laterality: Left;  Left  Total Knee Arthroplasty   KNEE ARTHROPLASTY Right 09/03/2013   Procedure: COMPUTER ASSISTED TOTAL KNEE ARTHROPLASTY;  Surgeon: Marybelle Killings, MD;  Location: [place];  Service: Orthopedics;  Laterality: Right;  Right Total Knee Arthroplasty, Computer Assist   KNEE ARTHROSCOPY     bil   LUMBAR LAMINECTOMY/DECOMPRESSION MICRODISCECTOMY N/A 08/18/2020   Procedure: L4-5 decompression, possible left L4-5 microdiscectomy;  Surgeon: Marybelle Killings, MD;  Location: [place];  Service: Orthopedics;  Laterality: N/A;   OOPHORECTOMY     BSO   SURGERY FOR POSERIOR TIBIAL TENDON  2003   TONSILLECTOMY     TRIPLE FUSION LEFT ANKLE Left 2007   TUBAL LIGATION      Allergies as of 01/09/2021       Reactions   Iodine Anaphylaxis   Shellfish Allergy Anaphylaxis   Sulfonamide Derivatives Rash   Citrus Hives, Itching, Other (See Comments)   Itching in mouth/ blisters on lips   Lactose Intolerance (gi) Diarrhea, Swelling   Swollen mouth   Amoxicillin Diarrhea   Did it involve swelling of the face/tongue/throat, SOB, or low BP? No Did it involve sudden or severe rash/hives, skin peeling, or any reaction on the inside of your mouth or nose? No  Did you need to seek medical attention at a hospital or doctor's office? No When did it last happen?     53-45 years old  If all above answers are "NO", may proceed with cephalosporin use.        Medication List        Accurate as of January 09, 2021 11:59 PM. If you have any questions, ask your nurse or doctor.          aspirin EC 81 MG tablet Take 81 mg by mouth daily.   atorvastatin 10 MG tablet Commonly known as: LIPITOR Take 1 tablet (10 mg total) by mouth at bedtime.   CALCIUM 600 PO Take 600 mg by mouth in the morning and at bedtime. (1600 & bedtime)   Cholecalciferol 50 MCG (2000 UT) Caps Take 2,000 Units by mouth at bedtime.   diltiazem 240 MG 24 hr capsule Commonly known as: CARDIZEM CD Take 1 capsule (240  mg total) by mouth daily.   empagliflozin 10 MG Tabs tablet Commonly known as: Jardiance Take 1 tablet (10 mg total) by mouth daily before breakfast.   EPINEPHrine 0.3 mg/0.3 mL Devi Commonly known as: EPI-PEN Inject 0.3 mg into the muscle See admin instructions. Inject 0.3 mg intramuscularly in the leg as needed for severe allergic reaction, call 911 and then inject again in the other leg. Reported on 06/26/2015   febuxostat 40 MG tablet Commonly known as: ULORIC Take 40 mg by mouth every evening.   fluticasone 110 MCG/ACT inhaler Commonly known as: FLOVENT HFA Inhale 2 puffs into the lungs daily.   fluticasone 50 MCG/ACT nasal spray Commonly known as: FLONASE Place 2 sprays into both nostrils in the morning.   levalbuterol 45 MCG/ACT inhaler Commonly known as: XOPENEX HFA Inhale 2 puffs into the lungs every 4 (four) hours as needed for shortness of breath.   levocetirizine 5 MG tablet Commonly known as: XYZAL Take 5 mg by mouth daily at 4 PM.   losartan 25 MG tablet Commonly known as: COZAAR Take 1 tablet (25 mg total) by mouth daily.   metFORMIN 850 MG tablet Commonly known as: GLUCOPHAGE Take 1 tablet (850 mg total) by mouth in the morning, at noon, and at bedtime.   metoprolol succinate 50 MG 24 hr tablet Commonly known as: TOPROL-XL TAKE 1 TABLET BY MOUTH DAILY. TAKE WITH OR IMMEDIATELY FOLLOWING A MEAL.   nortriptyline 25 MG capsule Commonly known as: PAMELOR Take 25 mg by mouth daily at 4 PM.   omeprazole 40 MG capsule Commonly known as: PRILOSEC TAKE 1 CAPSULE BY MOUTH EVERY DAY What changed:  how much to take when to take this   pioglitazone 30 MG tablet Commonly known as: ACTOS Take 1 tablet (30 mg total) by mouth daily. What changed: when to take this   vitamin E 180 MG (400 UNITS) capsule Take 400 Units by mouth daily at 4 PM.           Objective:   Physical Exam BP 120/68 (BP Location: Left Arm, Patient Position: Sitting, Cuff Size:  Small)   Pulse 93   Temp 98.1 F (36.7 C) (Oral)   Resp 16   Ht _0  (1.651 m)   Wt 210 lb 8 oz (95.5 kg)   SpO2 96%   BMI 35.03 kg/m  General:   Well developed, NAD, BMI noted. HEENT:  Normocephalic . Face symmetric, atraumatic Lungs:  CTA B Normal respiratory effort, no intercostal retractions, no accessory muscle use. Heart: RRR,  no murmur.  Lower extremities: no pretibial edema bilaterally  Skin: Not pale. Not jaundice Neurologic:  alert & oriented X3.  Speech normal, gait appropriate for age and unassisted Psych--  Cognition and judgment appear intact.  Cooperative with normal attention span and concentration.  Behavior appropriate. No anxious or depressed appearing.      Assessment     Assessment DM w/ h/o TIA HTN  Hyperlipidemia  Insomnia  Asthma , allergies-- sees Dr Harold Hedge  GERD, hiatal hernia , h/o IBS Obesity  CV: ---Palpitations: PSVT by Holter, Dr Gwenlyn Found ---TIA, dx 2014,last visit w/  Dr. Leonie Man 09-2015, RTC prn ENT: Dr Arnette Norris -Vertigo (improved with vestibular rehabilitation 2015) -HOH- aids, brain  MRI 03/2019.   -Menier's (Dx   2020) Increased LFTs --- CT abdomen 2014: NormalLiver, negative hepatitis serologies at rheumatology per patient MSK: Osteopenia, gout, DJD -Fibromyalgia (Dr. Amil Amen), on Pamelor  FH breast cancer , sister is BRCA2 (+), pt is  (-) GU: Kidney stones, h/o; Urinary incontinence; Atrophic vaginitis Hyperparathyroidism h/o slt increased PTH 2015 , normal PTH 05-2014  Covid infex 11-2020 , no treatment     PLAN: DM: After last visit, A1c was 6.8, improving.  Currently on Jardiance, Actos, metformin.  Admits her diet has not been good, has gained weight on her own scales, plan: Check A1c, if it is slightly elevated we agreed on work on diet without change of medications. Hyperlipidemia: On atorvastatin, checking labs. HTN: At the last visit, she reported low BP episodes, no further low BP, on cardiazem-losartan-metoprolol.   Check a BMP, DOE, SVT: Cardiology 10/15/2020: Was seen with DOE, no further cardiac work-up recommended.  No recent symptoms of SVT.  Rec to RTC to cards in 1 year DJD, back pain: Had lumbar spine surgery 07-2020, recuperating very well. Mnire's disease,finished PT recently, doing better  Preventive care: Recommend to contact GI regarding C-scope, due for several vaccines, see AVS. RTC 5 to 6 months    This visit occurred during the SARS-CoV-2 public health emergency.  Safety protocols were in place, including screening questions prior to the visit, additional usage of staff PPE, and extensive cleaning of exam room while observing appropriate contact time as indicated for disinfecting solutions.

## 2021-01-10 NOTE — Assessment & Plan Note (Signed)
DM: After last visit, A1c was 6.8, improving.  Currently on Jardiance, Actos, metformin.  Admits her diet has not been good, has gained weight on her own scales, plan: Check A1c, if it is slightly elevated we agreed on work on diet without change of medications. Hyperlipidemia: On atorvastatin, checking labs. HTN: At the last visit, she reported low BP episodes, no further low BP, on cardiazem-losartan-metoprolol.  Check a BMP, DOE, SVT: Cardiology 10/15/2020: Was seen with DOE, no further cardiac work-up recommended.  No recent symptoms of SVT.  Rec to RTC to cards in 1 year DJD, back pain: Had lumbar spine surgery 07-2020, recuperating very well. Mnire's disease,finished PT recently, doing better  Preventive care: Recommend to contact GI regarding C-scope, due for several vaccines, see AVS. RTC 5 to 6 months

## 2021-01-15 ENCOUNTER — Encounter: Payer: Medicare Other | Admitting: Physical Therapy

## 2021-01-22 ENCOUNTER — Encounter: Payer: Medicare Other | Admitting: Physical Therapy

## 2021-01-28 ENCOUNTER — Other Ambulatory Visit: Payer: Self-pay | Admitting: Internal Medicine

## 2021-02-03 ENCOUNTER — Ambulatory Visit: Payer: Medicare Other

## 2021-02-06 ENCOUNTER — Other Ambulatory Visit: Payer: Self-pay | Admitting: Internal Medicine

## 2021-02-24 DIAGNOSIS — H8101 Meniere's disease, right ear: Secondary | ICD-10-CM | POA: Diagnosis not present

## 2021-02-24 DIAGNOSIS — R42 Dizziness and giddiness: Secondary | ICD-10-CM | POA: Diagnosis not present

## 2021-02-24 DIAGNOSIS — H903 Sensorineural hearing loss, bilateral: Secondary | ICD-10-CM | POA: Diagnosis not present

## 2021-03-06 ENCOUNTER — Other Ambulatory Visit: Payer: Self-pay | Admitting: Obstetrics & Gynecology

## 2021-03-06 ENCOUNTER — Other Ambulatory Visit: Payer: Self-pay | Admitting: Internal Medicine

## 2021-03-06 DIAGNOSIS — Z1231 Encounter for screening mammogram for malignant neoplasm of breast: Secondary | ICD-10-CM

## 2021-03-09 ENCOUNTER — Ambulatory Visit (INDEPENDENT_AMBULATORY_CARE_PROVIDER_SITE_OTHER): Payer: Medicare Other

## 2021-03-09 ENCOUNTER — Other Ambulatory Visit: Payer: Self-pay

## 2021-03-09 ENCOUNTER — Encounter: Payer: Self-pay | Admitting: Internal Medicine

## 2021-03-09 DIAGNOSIS — Z23 Encounter for immunization: Secondary | ICD-10-CM

## 2021-03-10 ENCOUNTER — Ambulatory Visit (AMBULATORY_SURGERY_CENTER): Payer: Medicare Other

## 2021-03-10 ENCOUNTER — Telehealth: Payer: Self-pay

## 2021-03-10 ENCOUNTER — Other Ambulatory Visit: Payer: Self-pay

## 2021-03-10 VITALS — Ht 65.0 in | Wt 210.0 lb

## 2021-03-10 DIAGNOSIS — Z1211 Encounter for screening for malignant neoplasm of colon: Secondary | ICD-10-CM

## 2021-03-10 MED ORDER — PEG 3350-KCL-NA BICARB-NACL 420 G PO SOLR: 4000.0000 mL | Freq: Once | ORAL | 0 refills | Status: AC

## 2021-03-10 NOTE — Telephone Encounter (Signed)
Thank you for the note.  I reviewed her 01/09/21 PCP office note, and she appears to be in generally good health.  This patient can come for the direct-book colonoscopy as scheduled and does not need to see me in the office first.  - HD

## 2021-03-10 NOTE — Progress Notes (Signed)
  Patient's pre-visit was done today over the phone with the patient   Name,DOB and address verified.   Patient denies any allergies to Eggs and Soy.  Patient denies any problems with anesthesia/sedation. Patient denies taking diet pills or blood thinners.  Denies atrial flutter or atrial fib Denies chronic constipation No home Oxygen.   Packet of Prep instructions mailed to patient including a copy of a consent form-pt is aware.  Patient understands to call us back with any questions or concerns.  Patient is aware of our care-partner policy and [id number]-[id number] safety protocol.    Note sent to Dr. Loletha Carrow:  Pt is 49 and has no RAS in chart nor has she had a OV within the past yr,

## 2021-03-10 NOTE — Telephone Encounter (Signed)
Dr. Loletha Carrow:  Pt is a 75 yr old recall.  She had not had a physical in the past 12 months with the office.  She does not have a recall assessment in her chart.  Do you want her to have an office visit prior to her procedure on 10/25.  Thank you.

## 2021-03-15 ENCOUNTER — Other Ambulatory Visit: Payer: Self-pay | Admitting: Internal Medicine

## 2021-03-24 ENCOUNTER — Encounter: Payer: Self-pay | Admitting: Gastroenterology

## 2021-03-24 ENCOUNTER — Other Ambulatory Visit: Payer: Self-pay

## 2021-03-24 ENCOUNTER — Ambulatory Visit (AMBULATORY_SURGERY_CENTER): Payer: Medicare Other | Admitting: Gastroenterology

## 2021-03-24 VITALS — BP 134/73 | HR 88 | Temp 96.6°F | Resp 12 | Ht 65.0 in | Wt 210.0 lb

## 2021-03-24 DIAGNOSIS — D123 Benign neoplasm of transverse colon: Secondary | ICD-10-CM

## 2021-03-24 DIAGNOSIS — D120 Benign neoplasm of cecum: Secondary | ICD-10-CM | POA: Diagnosis not present

## 2021-03-24 DIAGNOSIS — D128 Benign neoplasm of rectum: Secondary | ICD-10-CM

## 2021-03-24 DIAGNOSIS — Z86010 Personal history of colonic polyps: Secondary | ICD-10-CM

## 2021-03-24 DIAGNOSIS — D122 Benign neoplasm of ascending colon: Secondary | ICD-10-CM

## 2021-03-24 DIAGNOSIS — D125 Benign neoplasm of sigmoid colon: Secondary | ICD-10-CM | POA: Diagnosis not present

## 2021-03-24 DIAGNOSIS — D127 Benign neoplasm of rectosigmoid junction: Secondary | ICD-10-CM | POA: Diagnosis not present

## 2021-03-24 HISTORY — PX: COLONOSCOPY: SHX174

## 2021-03-24 MED ORDER — SODIUM CHLORIDE 0.9 % IV SOLN
500.0000 mL | Freq: Once | INTRAVENOUS | Status: DC
Start: 2021-03-24 — End: 2021-03-24

## 2021-03-24 NOTE — Progress Notes (Signed)
History and Physical:  This patient presents for endoscopic testing for: Encounter Diagnoses  Name Primary?   Special screening for malignant neoplasms, colon Yes   Personal history of colonic polyps     TA x 2 < 35m ; 26/2017  ROS: Patient denies chest pain or cough Denies chronic abd pain, rectal bleeding or altered BMs   Past Medical History: Past Medical History:  Diagnosis Date   Allergy    Environmental, foods: shellfish, lactose intolerant, citrus, peanuts   Asthma    mild persistent   Atrophic vaginitis    BRCA gene mutation negative 2015   testing done at [place]   Diabetes mellitus without complication (HCC)    Type 2   DJD (degenerative joint disease)    generalized joint pain, including back   Dysrhythmia    SVT   Family history of malignant neoplasm of breast    Fibromyalgia    Dr BAmil Amen  GERD (gastroesophageal reflux disease)    Gout    Idiopathic, chronic   Hiatal hernia    History of kidney stones    Hyperlipidemia    Hypertension    Palpitations    PSVT on Holter monitoring   Shortness of breath    occ   [facility]roke ([facility]    TIA  2014   SUI (stress urinary incontinence, female)    (resolved after surgery)   TIA (transient ischemic attack)    07/2012, see Dr SLeonie Man  Vertigo      Past Surgical History: Past Surgical History:  Procedure Laterality Date   ABDOMINAL HYSTERECTOMY  1994   TAH,BSO-BLADDER NECK SUSPENSION   BREAST SURGERY     LUMPECTOMY-BENIGN Breast Bx X2   CARPAL TUNNEL RELEASE Right 2005   CATARACT EXTRACTION, BILATERAL Bilateral 2016   Cataract    CHOLECYSTECTOMY, LAPAROSCOPIC  2001   CYSTOCELE REPAIR  1994   EYE SURGERY     JOINT REPLACEMENT     bilatral knees;    KNEE ARTHROPLASTY  05/19/2012   Procedure: COMPUTER ASSISTED TOTAL KNEE ARTHROPLASTY;  Surgeon: MMarybelle Killings MD;  Location: [place]  Service: Orthopedics;  Laterality: Left;  Left  Total Knee Arthroplasty   KNEE ARTHROPLASTY Right 09/03/2013   Procedure:  COMPUTER ASSISTED TOTAL KNEE ARTHROPLASTY;  Surgeon: MMarybelle Killings MD;  Location: [place]  Service: Orthopedics;  Laterality: Right;  Right Total Knee Arthroplasty, Computer Assist   KNEE ARTHROSCOPY     bil   LUMBAR LAMINECTOMY/DECOMPRESSION MICRODISCECTOMY N/A 08/18/2020   Procedure: L4-5 decompression, possible left L4-5 microdiscectomy;  Surgeon: YMarybelle Killings MD;  Location[place]  Service: Orthopedics;  Laterality: N/A;   OOPHORECTOMY     BSO   SURGERY FOR POSERIOR TIBIAL TENDON  2003   TONSILLECTOMY     TRIPLE FUSION LEFT ANKLE Left 2007   TUBAL LIGATION      Allergies: Allergies  Allergen Reactions   Iodine Anaphylaxis   Shellfish Allergy Anaphylaxis   Sulfonamide Derivatives Rash   Citrus Hives, Itching and Other (See Comments)    Itching in mouth/ blisters on lips   Lactose Intolerance (Gi) Diarrhea and Swelling    Swollen mouth   Amoxicillin Diarrhea    Did it involve swelling of the face/tongue/throat, SOB, or low BP? No Did it involve sudden or severe rash/hives, skin peeling, or any reaction on the inside of your mouth or nose? No Did you need to seek medical attention at a hospital or doctor's office? No When did  it last happen?     68-48 years old  If all above answers are "NO", may proceed with cephalosporin use.    Outpatient Meds: Current Outpatient Medications  Medication Sig Dispense Refill   aspirin EC 81 MG tablet Take 81 mg by mouth daily.     atorvastatin (LIPITOR) 10 MG tablet TAKE 1 TABLET BY MOUTH EVERYDAY AT BEDTIME 90 tablet 1   Calcium Carbonate (CALCIUM 600 PO) Take 600 mg by mouth in the morning and at bedtime. (1600 & bedtime)     Cholecalciferol 50 MCG (2000 UT) CAPS Take 2,000 Units by mouth at bedtime.      diltiazem (CARDIZEM CD) 240 MG 24 hr capsule TAKE 1 CAPSULE BY MOUTH EVERY DAY 90 capsule 1   empagliflozin (JARDIANCE) 10 MG TABS tablet Take 1 tablet (10 mg total) by mouth daily before breakfast. 90 tablet 1   febuxostat (ULORIC) 40 MG  tablet Take 40 mg by mouth every evening.      fluticasone (FLONASE) 50 MCG/ACT nasal spray Place 2 sprays into both nostrils in the morning.     fluticasone (FLOVENT HFA) 110 MCG/ACT inhaler Inhale 2 puffs into the lungs daily.     levocetirizine (XYZAL) 5 MG tablet Take 5 mg by mouth daily at 4 PM.     losartan (COZAAR) 25 MG tablet TAKE 1 TABLET (25 MG TOTAL) BY MOUTH DAILY. 90 tablet 1   metFORMIN (GLUCOPHAGE) 850 MG tablet Take 1.5 tablets (1,275 mg total) by mouth 2 (two) times daily with a meal. 270 tablet 1   metoprolol succinate (TOPROL-XL) 50 MG 24 hr tablet TAKE 1 TABLET BY MOUTH DAILY. TAKE WITH OR IMMEDIATELY FOLLOWING A MEAL. 90 tablet 1   nortriptyline (PAMELOR) 25 MG capsule Take 25 mg by mouth daily at 4 PM.     omeprazole (PRILOSEC) 40 MG capsule TAKE 1 CAPSULE BY MOUTH EVERY DAY (Patient taking differently: Take 40 mg by mouth daily before breakfast.) 90 capsule 3   pioglitazone (ACTOS) 30 MG tablet TAKE 1 TABLET BY MOUTH EVERY DAY 90 tablet 1   vitamin E 400 UNIT capsule Take 400 Units by mouth daily at 4 PM.     EPINEPHrine (EPI-PEN) 0.3 mg/0.3 mL DEVI Inject 0.3 mg into the muscle See admin instructions. Inject 0.3 mg intramuscularly in the leg as needed for severe allergic reaction, call 911 and then inject again in the other leg. Reported on 06/26/2015 (Patient not taking: Reported on 03/24/2021)     levalbuterol (XOPENEX HFA) 45 MCG/ACT inhaler Inhale 2 puffs into the lungs every 4 (four) hours as needed for shortness of breath. (Patient not taking: No sig reported)     Current Facility-Administered Medications  Medication Dose Route Frequency Provider Last Rate Last Admin   0.9 %  sodium chloride infusion  500 mL Intravenous Once Nelida Meuse III, MD          ___________________________________________________________________ Objective   Exam:  BP (!) 166/86   Pulse (!) 112   Temp (!) 96.6 F (35.9 C)   Ht _0  (1.651 m)   Wt 210 lb (95.3 kg)   SpO2 98%    BMI 34.95 kg/m   CV: RRR without murmur, S1/S2 Resp: clear to auscultation bilaterally, normal RR and effort noted GI: soft, no tenderness, with active bowel sounds.   Assessment: Encounter Diagnoses  Name Primary?   Special screening for malignant neoplasms, colon Yes   Personal history of colonic polyps      Plan:  Colonoscopy  The benefits and risks of the planned procedure were described in detail with the patient or (when appropriate) their health care proxy.  Risks were outlined as including, but not limited to, bleeding, infection, perforation, adverse medication reaction leading to cardiac or pulmonary decompensation, pancreatitis (if ERCP).  The limitation of incomplete mucosal visualization was also discussed.  No guarantees or warranties were given.    The patient is appropriate for an endoscopic procedure in the ambulatory setting.   - Wilfrid Lund, MD

## 2021-03-24 NOTE — Progress Notes (Signed)
Pt's states no medical or surgical changes since previsit or office visit. 

## 2021-03-24 NOTE — Op Note (Signed)
[facility] Patient Name: Kristy [name] Procedure Date: 03/24/2021 8:31 AM MRN: [id number] Endoscopist: Mallie Mussel L. Loletha Carrow , MD Age: 75 Referring MD:  Date of Birth: August 21, 1945 Gender: Female Account #: [id number] Procedure:                Colonoscopy Indications:              Surveillance: Personal history of adenomatous                            polyps on last colonoscopy > 5 years ago Medicines:                Monitored Anesthesia Care Procedure:                Pre-Anesthesia Assessment:                           - Prior to the procedure, a History and Physical                            was performed, and patient medications and                            allergies were reviewed. The patient's tolerance of                            previous anesthesia was also reviewed. The risks                            and benefits of the procedure and the sedation                            options and risks were discussed with the patient.                            All questions were answered, and informed consent                            was obtained. Prior Anticoagulants: The patient has                            taken no previous anticoagulant or antiplatelet                            agents. ASA Grade Assessment: II - A patient with                            mild systemic disease. After reviewing the risks                            and benefits, the patient was deemed in                            satisfactory condition to undergo the procedure.  After obtaining informed consent, the colonoscope                            was passed under direct vision. Throughout the                            procedure, the patient's blood pressure, pulse, and                            oxygen saturations were monitored continuously. The                            Colonoscope was introduced through the anus and                            advanced to the the cecum,  identified by                            appendiceal orifice and ileocecal valve. The                            colonoscopy was performed with difficulty due to a                            redundant colon and significant looping. Successful                            completion of the procedure was aided by using                            manual pressure and straightening and shortening                            the scope to obtain bowel loop reduction. The                            patient tolerated the procedure well. The quality                            of the bowel preparation was good after lavage. The                            ileocecal valve, appendiceal orifice, and rectum                            were photographed. The bowel preparation used was                            GoLYTELY. Scope In: 8:38:46 AM Scope Out: 9:19:38 AM Scope Withdrawal Time: 0 hours 35 minutes 38 seconds  Total Procedure Duration: 0 hours 40 minutes 52 seconds  Findings:                 The perianal and digital rectal examinations were  normal.                           Three sessile polyps were found in the proximal                            ascending colon and cecum. The polyps were 2 to 4                            mm in size. These polyps were removed with a cold                            snare. Resection and retrieval were complete. (Jar                            1)                           Two flat polyps were found in the transverse colon.                            The polyps were 2 to 12 mm in size. These polyps                            were removed with a cold snare. Resection and                            retrieval were complete. (Jar 2)                           Two sessile polyps were found in the distal rectum                            and sigmoid colon. The polyps were 3 to 5 mm in                            size. These polyps were removed with a  cold snare.                            Resection and retrieval were complete. (Jar 2)                           The sigmoid colon was redundant.                           The exam was otherwise without abnormality on                            direct and retroflexion views. Complications:            No immediate complications. Estimated Blood Loss:     Estimated blood loss was minimal. Impression:               - Three 2 to 4 mm polyps in  the proximal ascending                            colon and in the cecum, removed with a cold snare.                            Resected and retrieved.                           - Two 2 to 12 mm polyps in the transverse colon,                            removed with a cold snare. Resected and retrieved.                           - Two 3 to 5 mm polyps in the distal rectum and in                            the sigmoid colon, removed with a cold snare.                            Resected and retrieved.                           - Redundant colon.                           - The examination was otherwise normal on direct                            and retroflexion views. Recommendation:           - Patient has a contact number available for                            emergencies. The signs and symptoms of potential                            delayed complications were discussed with the                            patient. Return to normal activities tomorrow.                            Written discharge instructions were provided to the                            patient.                           - Resume previous diet.                           - Continue present medications.                           -  Await pathology results.                           - Repeat colonoscopy in 3 years for surveillance. Daisha Filosa L. Loletha Carrow, MD 03/24/2021 9:27:07 AM This report has been signed electronically.

## 2021-03-24 NOTE — Progress Notes (Signed)
Called to room to assist during endoscopic procedure.  Patient ID and intended procedure confirmed with present staff. Received instructions for my participation in the procedure from the performing physician.  

## 2021-03-24 NOTE — Patient Instructions (Signed)
YOU HAD AN ENDOSCOPIC PROCEDURE TODAY AT THE [facility]:   Refer to the procedure report that was given to you for any specific questions about what was found during the examination.  If the procedure report does not answer your questions, please call your gastroenterologist to clarify.  If you requested that your care partner not be given the details of your procedure findings, then the procedure report has been included in a sealed envelope for you to review at your convenience later.  YOU SHOULD EXPECT: Some feelings of bloating in the abdomen. Passage of more gas than usual.  Walking can help get rid of the air that was put into your GI tract during the procedure and reduce the bloating. If you had a lower endoscopy (such as a colonoscopy or flexible sigmoidoscopy) you may notice spotting of blood in your stool or on the toilet paper. If you underwent a bowel prep for your procedure, you may not have a normal bowel movement for a few days.  Please Note:  You might notice some irritation and congestion in your nose or some drainage.  This is from the oxygen used during your procedure.  There is no need for concern and it should clear up in a day or so.  SYMPTOMS TO REPORT IMMEDIATELY:   Following lower endoscopy (colonoscopy or flexible sigmoidoscopy):  Excessive amounts of blood in the stool  Significant tenderness or worsening of abdominal pains  Swelling of the abdomen that is new, acute  Fever of 100F or higher  For urgent or emergent issues, a gastroenterologist can be reached at any hour by calling [phone]. Do not use MyChart messaging for urgent concerns.    DIET:  We do recommend a small meal at first, but then you may proceed to your regular diet.  Drink plenty of fluids but you should avoid alcoholic beverages for 24 hours.  ACTIVITY:  You should plan to take it easy for the rest of today and you should NOT DRIVE or use heavy machinery until tomorrow (because  of the sedation medicines used during the test).    FOLLOW UP: Our staff will call the number listed on your records 48-72 hours following your procedure to check on you and address any questions or concerns that you may have regarding the information given to you following your procedure. If we do not reach you, we will leave a message.  We will attempt to reach you two times.  During this call, we will ask if you have developed any symptoms of COVID 19. If you develop any symptoms (ie: fever, flu-like symptoms, shortness of breath, cough etc.) before then, please call [phone].  If you test positive for Covid 19 in the 2 weeks post procedure, please call and report this information to us.    If any biopsies were taken you will be contacted by phone or by letter within the next 1-3 weeks.  Please call us at [phone] if you have not heard about the biopsies in 3 weeks.    SIGNATURES/CONFIDENTIALITY: You and/or your care partner have signed paperwork which will be entered into your electronic medical record.  These signatures attest to the fact that that the information above on your After Visit Summary has been reviewed and is understood.  Full responsibility of the confidentiality of this discharge information lies with you and/or your care-partner. 

## 2021-03-24 NOTE — Progress Notes (Signed)
Pt Drowsy. VSS. To PACU, report to RN. No anesthetic complications noted.  

## 2021-03-26 ENCOUNTER — Telehealth: Payer: Self-pay | Admitting: *Deleted

## 2021-03-26 ENCOUNTER — Telehealth: Payer: Self-pay

## 2021-03-26 NOTE — Telephone Encounter (Signed)
  Follow up Call-  Call back number 03/24/2021  Post procedure Call Back phone  # [phone]  Permission to leave phone message Yes  Some recent data might be hidden     Patient questions:  Do you have a fever, pain , or abdominal swelling? No. Pain Score  0 *  Have you tolerated food without any problems? No.  Have you been able to return to your normal activities? Yes.    Do you have any questions about your discharge instructions: Diet   No. Medications  No. Follow up visit  No.  Do you have questions or concerns about your Care? No.  Actions: * If pain score is 4 or above: No action needed, pain <4.

## 2021-03-26 NOTE — Telephone Encounter (Signed)
Called [phone] and pt's voice mail is not set up.  I could not leave a message that I called to check on pt from procedure.

## 2021-03-30 ENCOUNTER — Encounter: Payer: Self-pay | Admitting: Gastroenterology

## 2021-04-02 DIAGNOSIS — H1045 Other chronic allergic conjunctivitis: Secondary | ICD-10-CM | POA: Diagnosis not present

## 2021-04-02 DIAGNOSIS — J3089 Other allergic rhinitis: Secondary | ICD-10-CM | POA: Diagnosis not present

## 2021-04-02 DIAGNOSIS — J301 Allergic rhinitis due to pollen: Secondary | ICD-10-CM | POA: Diagnosis not present

## 2021-04-02 DIAGNOSIS — J3081 Allergic rhinitis due to animal (cat) (dog) hair and dander: Secondary | ICD-10-CM | POA: Diagnosis not present

## 2021-04-06 ENCOUNTER — Encounter: Payer: Self-pay | Admitting: Internal Medicine

## 2021-04-06 DIAGNOSIS — H9190 Unspecified hearing loss, unspecified ear: Secondary | ICD-10-CM

## 2021-04-08 ENCOUNTER — Other Ambulatory Visit: Payer: Self-pay

## 2021-04-08 ENCOUNTER — Ambulatory Visit
Admission: RE | Admit: 2021-04-08 | Discharge: 2021-04-08 | Disposition: A | Discharge status: Home / Self Care | Payer: Medicare Other | Source: Ambulatory Visit | Attending: Obstetrics & Gynecology | Admitting: Obstetrics & Gynecology

## 2021-04-08 DIAGNOSIS — Z1231 Encounter for screening mammogram for malignant neoplasm of breast: Secondary | ICD-10-CM

## 2021-04-21 DIAGNOSIS — E119 Type 2 diabetes mellitus without complications: Secondary | ICD-10-CM | POA: Diagnosis not present

## 2021-04-21 DIAGNOSIS — H903 Sensorineural hearing loss, bilateral: Secondary | ICD-10-CM | POA: Diagnosis not present

## 2021-04-21 DIAGNOSIS — H8101 Meniere's disease, right ear: Secondary | ICD-10-CM | POA: Diagnosis not present

## 2021-04-28 DIAGNOSIS — M797 Fibromyalgia: Secondary | ICD-10-CM | POA: Diagnosis not present

## 2021-04-28 DIAGNOSIS — M150 Primary generalized (osteo)arthritis: Secondary | ICD-10-CM | POA: Diagnosis not present

## 2021-04-28 DIAGNOSIS — M65321 Trigger finger, right index finger: Secondary | ICD-10-CM | POA: Diagnosis not present

## 2021-04-28 DIAGNOSIS — M5136 Other intervertebral disc degeneration, lumbar region: Secondary | ICD-10-CM | POA: Diagnosis not present

## 2021-04-28 DIAGNOSIS — M1A09X0 Idiopathic chronic gout, multiple sites, without tophus (tophi): Secondary | ICD-10-CM | POA: Diagnosis not present

## 2021-04-28 DIAGNOSIS — E669 Obesity, unspecified: Secondary | ICD-10-CM | POA: Diagnosis not present

## 2021-04-28 DIAGNOSIS — R945 Abnormal results of liver function studies: Secondary | ICD-10-CM | POA: Diagnosis not present

## 2021-04-28 DIAGNOSIS — Z6836 Body mass index (BMI) 36.0-36.9, adult: Secondary | ICD-10-CM | POA: Diagnosis not present

## 2021-05-12 ENCOUNTER — Other Ambulatory Visit: Payer: Self-pay | Admitting: Internal Medicine

## 2021-05-12 DIAGNOSIS — H52223 Regular astigmatism, bilateral: Secondary | ICD-10-CM | POA: Diagnosis not present

## 2021-05-12 DIAGNOSIS — E119 Type 2 diabetes mellitus without complications: Secondary | ICD-10-CM | POA: Diagnosis not present

## 2021-05-12 DIAGNOSIS — I10 Essential (primary) hypertension: Secondary | ICD-10-CM | POA: Diagnosis not present

## 2021-05-12 DIAGNOSIS — H81399 Other peripheral vertigo, unspecified ear: Secondary | ICD-10-CM | POA: Diagnosis not present

## 2021-05-12 DIAGNOSIS — E7800 Pure hypercholesterolemia, unspecified: Secondary | ICD-10-CM | POA: Diagnosis not present

## 2021-05-12 DIAGNOSIS — H43813 Vitreous degeneration, bilateral: Secondary | ICD-10-CM | POA: Diagnosis not present

## 2021-05-12 DIAGNOSIS — H43393 Other vitreous opacities, bilateral: Secondary | ICD-10-CM | POA: Diagnosis not present

## 2021-05-12 DIAGNOSIS — H524 Presbyopia: Secondary | ICD-10-CM | POA: Diagnosis not present

## 2021-05-12 DIAGNOSIS — Z9849 Cataract extraction status, unspecified eye: Secondary | ICD-10-CM | POA: Diagnosis not present

## 2021-05-12 DIAGNOSIS — H5213 Myopia, bilateral: Secondary | ICD-10-CM | POA: Diagnosis not present

## 2021-05-12 DIAGNOSIS — Z7984 Long term (current) use of oral hypoglycemic drugs: Secondary | ICD-10-CM | POA: Diagnosis not present

## 2021-05-12 LAB — HM DIABETES EYE EXAM

## 2021-05-20 ENCOUNTER — Encounter: Payer: Self-pay | Admitting: Internal Medicine

## 2021-06-09 ENCOUNTER — Other Ambulatory Visit: Payer: Self-pay | Admitting: Internal Medicine

## 2021-06-24 ENCOUNTER — Other Ambulatory Visit: Payer: Self-pay | Admitting: Internal Medicine

## 2021-07-15 ENCOUNTER — Encounter: Payer: Self-pay | Admitting: Internal Medicine

## 2021-07-15 ENCOUNTER — Ambulatory Visit (INDEPENDENT_AMBULATORY_CARE_PROVIDER_SITE_OTHER): Payer: Medicare Other | Admitting: Internal Medicine

## 2021-07-15 VITALS — BP 136/76 | HR 94 | Temp 98.0°F | Resp 18 | Ht 65.0 in | Wt 216.2 lb

## 2021-07-15 DIAGNOSIS — E079 Disorder of thyroid, unspecified: Secondary | ICD-10-CM

## 2021-07-15 DIAGNOSIS — Z0189 Encounter for other specified special examinations: Secondary | ICD-10-CM | POA: Diagnosis not present

## 2021-07-15 DIAGNOSIS — E1169 Type 2 diabetes mellitus with other specified complication: Secondary | ICD-10-CM

## 2021-07-15 DIAGNOSIS — I10 Essential (primary) hypertension: Secondary | ICD-10-CM | POA: Diagnosis not present

## 2021-07-15 DIAGNOSIS — F439 Reaction to severe stress, unspecified: Secondary | ICD-10-CM | POA: Diagnosis not present

## 2021-07-15 LAB — T4, FREE: Free T4: 0.91 ng/dL (ref 0.60–1.60)

## 2021-07-15 LAB — CBC WITH DIFFERENTIAL/PLATELET
Basophils Absolute: 0.1 10*3/uL (ref 0.0–0.1)
Basophils Relative: 0.8 % (ref 0.0–3.0)
Eosinophils Absolute: 0.1 10*3/uL (ref 0.0–0.7)
Eosinophils Relative: 1.4 % (ref 0.0–5.0)
HCT: 39.2 % (ref 36.0–46.0)
Hemoglobin: 12.1 g/dL (ref 12.0–15.0)
Lymphocytes Relative: 34.6 % (ref 12.0–46.0)
Lymphs Abs: 2.9 10*3/uL (ref 0.7–4.0)
MCHC: 31 g/dL (ref 30.0–36.0)
MCV: 73.2 fl — ABNORMAL LOW (ref 78.0–100.0)
Monocytes Absolute: 0.9 10*3/uL (ref 0.1–1.0)
Monocytes Relative: 10.3 % (ref 3.0–12.0)
Neutro Abs: 4.5 10*3/uL (ref 1.4–7.7)
Neutrophils Relative %: 52.9 % (ref 43.0–77.0)
Platelets: 308 10*3/uL (ref 150.0–400.0)
RBC: 5.35 Mil/uL — ABNORMAL HIGH (ref 3.87–5.11)
RDW: 18.6 % — ABNORMAL HIGH (ref 11.5–15.5)
WBC: 8.5 10*3/uL (ref 4.0–10.5)

## 2021-07-15 LAB — HEMOGLOBIN A1C: Hgb A1c MFr Bld: 6.9 % — ABNORMAL HIGH (ref 4.6–6.5)

## 2021-07-15 LAB — TSH: TSH: 4.03 u[IU]/mL (ref 0.35–5.50)

## 2021-07-15 NOTE — Patient Instructions (Addendum)
Check the  blood pressure regularly BP GOAL is between 110/65 and  135/85. If it is consistently higher or lower, let me know  Vaccines I recommend: Tdap Shingrix   We will refer you to neurology to rule out a sleep apnea   GO TO THE LAB : Get the blood work     [name], [place] back for   a checkup in 4 months       "Living will", "Reading of attorney": Advanced care planning  (If you already have a living will or healthcare power of attorney, please bring the copy to be scanned in your chart.)  Advance care planning is a process that supports adults in  understanding and sharing their preferences regarding future medical care.   The patient's preferences are recorded in documents called Advance Directives.    Advanced directives are completed (and can be modified at any time) while the patient is in full mental capacity.   The documentation should be available at all times to the patient, the family and the healthcare providers.  Bring in a copy to be scanned in your chart is an excellent idea and is recommended   This legal documents direct treatment decision making and/or appoint a surrogate to make the decision if the patient is not capable to do so.    Advance directives can be documented in many types of formats,  documents have names such as:  Lliving will  Durable power of attorney for healthcare (healthcare proxy or healthcare power of attorney)  Combined directives  Physician orders for life-sustaining treatment    More information at:  [email or web address]

## 2021-07-15 NOTE — Progress Notes (Signed)
Subjective:    Patient ID: Kristy [name], female    DOB: April 16, 1946, 76 y.o.   MRN: [id number]  DOS:  07/15/2021 Type of visit - description: f/u  Routine follow-up, chronic medical issues reviewed, preventive care reviewed as well. Reports a lot of stress, mostly since December 2022 when his son was admitted to the hospital with uncontrolled diabetes and heart disease.  Her diet has not changed much, she is chronically unable to exercise much.  Ambulatory CBGs as high as 180, previously better.  Also report a lot of fatigue which is not a new issue however on further questioning she reports that her chronic snoring  is slightly worse and she is falling asleep very easily.  Denies chest pain or difficulty breathing No edema No nausea vomiting No blood in the stool  Review of Systems See above   Past Medical History:  Diagnosis Date   Allergy    Environmental, foods: shellfish, lactose intolerant, citrus, peanuts   Asthma    mild persistent   Atrophic vaginitis    BRCA gene mutation negative 2015   testing done at [place]   Diabetes mellitus without complication (HCC)    Type 2   DJD (degenerative joint disease)    generalized joint pain, including back   Dysrhythmia    SVT   Family history of malignant neoplasm of breast    Fibromyalgia    Dr Amil Amen   GERD (gastroesophageal reflux disease)    Gout    Idiopathic, chronic   Hiatal hernia    History of kidney stones    Hyperlipidemia    Hypertension    Palpitations    PSVT on Holter monitoring   Shortness of breath    occ   Stroke [facility])    TIA  2014   SUI (stress urinary incontinence, female)    (resolved after surgery)   TIA (transient ischemic attack)    07/2012, see Dr Leonie Man   Vertigo     Past Surgical History:  Procedure Laterality Date   ABDOMINAL HYSTERECTOMY  1994   TAH,BSO-BLADDER NECK SUSPENSION   BREAST SURGERY     LUMPECTOMY-BENIGN Breast Bx X2   CARPAL TUNNEL RELEASE Right 2005    CATARACT EXTRACTION, BILATERAL Bilateral 2016   Cataract    CHOLECYSTECTOMY, LAPAROSCOPIC  2001   CYSTOCELE REPAIR  1994   EYE SURGERY     JOINT REPLACEMENT     bilatral knees;    KNEE ARTHROPLASTY  05/19/2012   Procedure: COMPUTER ASSISTED TOTAL KNEE ARTHROPLASTY;  Surgeon: Marybelle Killings, MD;  Location: [place];  Service: Orthopedics;  Laterality: Left;  Left  Total Knee Arthroplasty   KNEE ARTHROPLASTY Right 09/03/2013   Procedure: COMPUTER ASSISTED TOTAL KNEE ARTHROPLASTY;  Surgeon: Marybelle Killings, MD;  Location: [place];  Service: Orthopedics;  Laterality: Right;  Right Total Knee Arthroplasty, Computer Assist   KNEE ARTHROSCOPY     bil   LUMBAR LAMINECTOMY/DECOMPRESSION MICRODISCECTOMY N/A 08/18/2020   Procedure: L4-5 decompression, possible left L4-5 microdiscectomy;  Surgeon: Marybelle Killings, MD;  Location: [place];  Service: Orthopedics;  Laterality: N/A;   OOPHORECTOMY     BSO   SURGERY FOR POSERIOR TIBIAL TENDON  2003   TONSILLECTOMY     TRIPLE FUSION LEFT ANKLE Left 2007   TUBAL LIGATION      Current Outpatient Medications  Medication Instructions   aspirin EC 81 mg, Oral, Daily   atorvastatin (LIPITOR) 10 MG tablet TAKE 1 TABLET BY MOUTH  EVERYDAY AT BEDTIME   Calcium Carbonate (CALCIUM 600 PO) 600 mg, Oral, 2 times daily, (1600 & bedtime)   Cholecalciferol 2,000 Units, Oral, Daily at bedtime   diltiazem (CARDIZEM CD) 240 MG 24 hr capsule TAKE 1 CAPSULE BY MOUTH EVERY DAY   empagliflozin (JARDIANCE) 10 mg, Oral, Daily before breakfast, Due for visit 07/2021   EPINEPHrine (EPI-PEN) 0.3 mg, See admin instructions   febuxostat (ULORIC) 40 mg, Oral, Every evening   fluticasone (FLONASE) 50 MCG/ACT nasal spray 2 sprays, Each Nare, Every morning   fluticasone (FLOVENT HFA) 110 MCG/ACT inhaler 2 puffs, Inhalation, Daily   levalbuterol (XOPENEX HFA) 45 MCG/ACT inhaler 2 puffs, Every 4 hours PRN   levocetirizine (XYZAL) 5 mg, Oral, Daily-1600   losartan (COZAAR) 25 mg, Oral, Daily    metFORMIN (GLUCOPHAGE) 1,275 mg, Oral, 2 times daily with meals   metoprolol succinate (TOPROL-XL) 50 MG 24 hr tablet TAKE 1 TABLET BY MOUTH DAILY WITH OR IMMEDIATELY FOLLOWING A MEAL   nortriptyline (PAMELOR) 25 mg, Oral, Daily-1600   omeprazole (PRILOSEC) 40 MG capsule TAKE 1 CAPSULE BY MOUTH EVERY DAY   pioglitazone (ACTOS) 30 MG tablet TAKE 1 TABLET BY MOUTH EVERY DAY   vitamin E 400 Units, Oral, Daily-1600       Objective:   Physical Exam BP 136/76 (BP Location: Left Arm, Patient Position: Sitting, Cuff Size: Normal)    Pulse 94    Temp 98 F (36.7 C) (Oral)    Resp 18    Ht '5\' 5"'  (1.651 m)    Wt 216 lb 4 oz (98.1 kg)    SpO2 95%    BMI 35.99 kg/m  General: Well developed, NAD, BMI noted Neck: No  thyromegaly  HEENT:  Normocephalic . Face symmetric, atraumatic Lungs:  CTA B Normal respiratory effort, no intercostal retractions, no accessory muscle use. Heart: RRR,  no murmur.  Abdomen:  Not distended, soft, non-tender. No rebound or rigidity.   Lower extremities: no pretibial edema bilaterally  Skin: Exposed areas without rash. Not pale. Not jaundice Neurologic:  alert & oriented X3.  Speech normal, gait: Assisted by cane Strength symmetric and appropriate for age.  Psych: Cognition and judgment appear intact.  Cooperative with normal attention span and concentration.  Behavior appropriate. No anxious or depressed appearing.     Assessment      Assessment DM w/ h/o TIA HTN  Hyperlipidemia  Insomnia  Asthma , allergies-- sees Dr Harold Hedge  GERD, hiatal hernia , h/o IBS Obesity  CV: ---Palpitations: PSVT by Holter, Dr Gwenlyn Found ---TIA, dx 2014,last visit w/  Dr. Leonie Man 09-2015, RTC prn ENT: Dr Arnette Norris -Vertigo (improved with vestibular rehabilitation 2015) -HOH- aids, brain  MRI 03/2019.   -Menier's (Dx   2020) Increased LFTs --- CT abdomen 2014: NormalLiver, negative hepatitis serologies at rheumatology per patient MSK: *Osteopenia: dexa per Gyn *Fibromyalgia,  DJD, Gout : per Dr. Amil Amen, on [place]  FH breast cancer , sister is BRCA2 (+), pt is  (-) GU: Kidney stones, h/o; Urinary incontinence; Atrophic vaginitis Hyperparathyroidism h/o slt increased PTH 2015 , normal PTH 05-2014     PLAN: DM: Currently on Jardiance, metformin, Actos.  Since December her CBGs have been higher than usual, up to 180.  Admits to increased stress level.  Check A1c HTN: BP today satisfactory, continue Cardizem, losartan, metoprolol.  Check a CMP and CBC History of increased TSH: Check TSH and free T4 Asthma: Reportedly well controlled OSA?  She has chronic fatigue, previously thought to be related  to FM.  Symptoms are getting worse, admits to snoring and falling asleep.  Epworth scale today: 17.  Refer for OSA rule out. Stress: Counseled. Preventive care reviewed RTC 4 months     This visit occurred during the SARS-CoV-2 public health emergency.  Safety protocols were in place, including screening questions prior to the visit, additional usage of staff PPE, and extensive cleaning of exam room while observing appropriate contact time as indicated for disinfecting solutions.

## 2021-07-16 LAB — COMPREHENSIVE METABOLIC PANEL
ALT: 15 U/L (ref 0–35)
AST: 14 U/L (ref 0–37)
Albumin: 4.6 g/dL (ref 3.5–5.2)
Alkaline Phosphatase: 58 U/L (ref 39–117)
BUN: 18 mg/dL (ref 6–23)
CO2: 26 mEq/L (ref 19–32)
Calcium: 10.4 mg/dL (ref 8.4–10.5)
Chloride: 102 mEq/L (ref 96–112)
Creatinine, Ser: 0.99 mg/dL (ref 0.40–1.20)
GFR: 55.33 mL/min — ABNORMAL LOW (ref >60.00)
Glucose, Bld: 113 mg/dL — ABNORMAL HIGH (ref 70–99)
Potassium: 4.3 mEq/L (ref 3.5–5.1)
Sodium: 139 mEq/L (ref 135–145)
Total Bilirubin: 1 mg/dL (ref 0.2–1.2)
Total Protein: 7.9 g/dL (ref 6.0–8.3)

## 2021-07-16 NOTE — Assessment & Plan Note (Signed)
Preventive care reviewed: -Td 2012.  Booster recommended -PNM 23: 2011, 2022 - PNM 13: 2015 - shingrix d/w pt - COVID VAX: UTD - flu shot - UTD  - Female care: * rec to see gyn  * (+)FH  of breast cancer (pt is BRCA negative) MMG 04-08-2021 ([facility]) - CCS: s/p colonoscopy in 2012, colonoscopy 11-2015, cscope 02-2021, next per GI -ACP d/w pt

## 2021-07-16 NOTE — Assessment & Plan Note (Signed)
DM: Currently on Jardiance, metformin, Actos.  Since December her CBGs have been higher than usual, up to 180.  Admits to increased stress level.  Check A1c HTN: BP today satisfactory, continue Cardizem, losartan, metoprolol.  Check a CMP and CBC History of increased TSH: Check TSH and free T4 Asthma: Reportedly well controlled OSA?  She has chronic fatigue, previously thought to be related to FM.  Symptoms are getting worse, admits to snoring and falling asleep.  Epworth scale today: 17.  Refer for OSA rule out. Stress: Counseled. Preventive care reviewed RTC 4 months

## 2021-08-27 DIAGNOSIS — H838X3 Other specified diseases of inner ear, bilateral: Secondary | ICD-10-CM | POA: Diagnosis not present

## 2021-08-27 DIAGNOSIS — H903 Sensorineural hearing loss, bilateral: Secondary | ICD-10-CM | POA: Diagnosis not present

## 2021-08-27 DIAGNOSIS — R42 Dizziness and giddiness: Secondary | ICD-10-CM | POA: Diagnosis not present

## 2021-08-27 DIAGNOSIS — H8101 Meniere's disease, right ear: Secondary | ICD-10-CM | POA: Diagnosis not present

## 2021-09-09 ENCOUNTER — Ambulatory Visit (INDEPENDENT_AMBULATORY_CARE_PROVIDER_SITE_OTHER): Payer: Medicare Other | Admitting: Obstetrics & Gynecology

## 2021-09-09 ENCOUNTER — Encounter: Payer: Self-pay | Admitting: Obstetrics & Gynecology

## 2021-09-09 VITALS — BP 94/73 | HR 77 | Ht 65.0 in | Wt 220.0 lb

## 2021-09-09 DIAGNOSIS — R32 Unspecified urinary incontinence: Secondary | ICD-10-CM | POA: Diagnosis not present

## 2021-09-09 DIAGNOSIS — Z0000 Encounter for general adult medical examination without abnormal findings: Secondary | ICD-10-CM | POA: Diagnosis not present

## 2021-09-09 DIAGNOSIS — Z01419 Encounter for gynecological examination (general) (routine) without abnormal findings: Secondary | ICD-10-CM | POA: Diagnosis not present

## 2021-09-09 DIAGNOSIS — R031 Nonspecific low blood-pressure reading: Secondary | ICD-10-CM

## 2021-09-09 NOTE — Progress Notes (Signed)
Subjective:  ?  ? Kristy [name] is a 76 y.o. female here for a routine exam.  Current complaints: her husband came through his surgery and is doing well. She reports some urge incontinence. She has her bladder tacked with her hysterectomy. Her sx have become worse in the past year. She can stop her urine in the midst of the stream without issues. She does not consume caffeinated drinks.  + FH of breast cancer in mother and sisters. Her ovaries have been removed. She cont to use the coconut oil and has no complaints of dryness.    ? ?Had polyps on colonoscopy and needs repeat in 2 years.  ?  ?Gynecologic History ?No LMP recorded. Patient has had a hysterectomy. ?Contraception: status post hysterectomy ?Last Pap: 6/10. Results were: normal ?Last mammogram: 11/22. Results were: normal ? ?Obstetric History ?OB History  ?Gravida Para Term Preterm AB Living  ?'2 2       2  '$ ?SAB IAB Ectopic Multiple Live Births  ?           ?  ?# Outcome Date GA Lbr Len/2nd Weight Sex Delivery Anes PTL Lv  ?2 Para           ?1 Para           ? ? ? ?The following portions of the patient's history were reviewed and updated as appropriate: allergies, current medications, past family history, past medical history, past social history, past surgical history, and problem list. ? ?Review of Systems ?Pertinent items are noted in HPI.  ?  ?Objective:  ?BP 94/73   Pulse 77   Ht '5\' 5"'$  (1.651 m)   Wt 220 lb (99.8 kg)   BMI 36.61 kg/m?  ?General Appearance:    Alert, cooperative, no distress, appears stated age  ?Head:    Normocephalic, without obvious abnormality, atraumatic  ?Eyes:    conjunctiva/corneas clear, EOM's intact, both eyes  ?Ears:    Normal external ear canals, both ears  ?Nose:   Nares normal, septum midline, mucosa normal, no drainage    or sinus tenderness  ?Throat:   Lips, mucosa, and tongue normal; teeth and gums normal  ?Neck:   Supple, symmetrical, trachea midline, no adenopathy;  ?  thyroid:  no enlargement/tenderness/nodules   ?Back:     Symmetric, no curvature, ROM normal, no CVA tenderness  ?Lungs:     respirations unlabored  ?Chest Wall:    No tenderness or deformity  ? Heart:    Regular rate and rhythm  ?Breast Exam:    No tenderness, masses, or nipple abnormality  ?Abdomen:     Soft, non-tender, bowel sounds active all four quadrants,  ?  no masses, no organomegaly  ?Genitalia:    Normal female without lesion, discharge or tenderness  ?   ?Extremities:   Extremities normal, atraumatic, no cyanosis or edema  ?Pulses:   2+ and symmetric all extremities  ?Skin:   Skin color, texture, turgor normal, no rashes or lesions  ?  ? ?Assessment:  ? ? Healthy female exam.  ?Urge incontinence ?  ?Plan:  ?Diagnoses and all orders for this visit: ? ?Well female exam with routine gynecological exam ? ?Incontinence of urine in female ?-     Ambulatory referral to Physical Therapy ? ?Low blood pressure reading ? ? Referral to PT for incontinence. Pt declines meds.  ? ?F/u in 1 year or sooner prn ? ?Delano Scardino L. Harraway-Smith, M.D., [place] ? ? ?

## 2021-09-15 ENCOUNTER — Encounter: Payer: Self-pay | Admitting: Internal Medicine

## 2021-09-15 ENCOUNTER — Ambulatory Visit (INDEPENDENT_AMBULATORY_CARE_PROVIDER_SITE_OTHER): Payer: Medicare Other | Admitting: Neurology

## 2021-09-15 ENCOUNTER — Encounter: Payer: Self-pay | Admitting: Neurology

## 2021-09-15 VITALS — BP 128/73 | HR 91 | Ht 65.0 in | Wt 221.8 lb

## 2021-09-15 DIAGNOSIS — R351 Nocturia: Secondary | ICD-10-CM | POA: Diagnosis not present

## 2021-09-15 DIAGNOSIS — Z820 Family history of epilepsy and other diseases of the nervous system: Secondary | ICD-10-CM

## 2021-09-15 DIAGNOSIS — R0683 Snoring: Secondary | ICD-10-CM | POA: Diagnosis not present

## 2021-09-15 DIAGNOSIS — R519 Headache, unspecified: Secondary | ICD-10-CM | POA: Diagnosis not present

## 2021-09-15 DIAGNOSIS — E669 Obesity, unspecified: Secondary | ICD-10-CM | POA: Diagnosis not present

## 2021-09-15 DIAGNOSIS — G4719 Other hypersomnia: Secondary | ICD-10-CM

## 2021-09-15 NOTE — Patient Instructions (Signed)

## 2021-09-15 NOTE — Progress Notes (Signed)
Subjective:  ?  ?Patient ID: Kristy [name] is a 76 y.o. female. ? ?HPI ? ? ? ?Star Age, MD, PhD ?[facility] ?[address], [address] ?[phone] ?[place], [place] [address] ? ?Dear Dr. Larose Kells,  ? ?I saw your patient, Kristy [name], upon your kind request in my sleep clinic today for initial consultation of her sleep disorder, in particular, concern for underlying obstructive sleep apnea.  The patient is accompanied by her husband today.  As you know, Kristy [name] is a 76 year old right-handed woman with an underlying medical history of TIA, stroke, carotid artery stenosis, dizziness (for which she has followed with Dr. Leonie Man for some years), asthma, allergies, diabetes, degenerative joint disease, fibromyalgia, vertigo, reflux disease, gout, history of kidney stones, hypertension, hyperlipidemia, palpitations with history of PSVT, and obesity, who reports snoring and excessive daytime somnolence. I reviewed your office note from 07/15/2021. Her Epworth sleepiness score is 20 out of 24, fatigue severity score is 63 out of 63.  Symptoms have become worse over time.  She has had intermittent vertigo and has seen Dr. Benjamine Mola.  She goes to bed generally around 10:30 AM and rise time is around 9 AM.  Some nights she has a hard time sleeping, and some nights she sleeps a lot.  She has nocturia about twice per average 9 and has had occasional morning headaches in the front.  She reports a family history of sleep apnea, her son has a CPAP machine.  She sleeps on a wedge, it helps her reflux symptoms in her breathing.  She wakes up with dry mouth.  She has allergy symptoms and asthma.  She does not drink caffeine daily.  She does not drink alcohol, she is a non-smoker. ? ?Her Past Medical History Is Significant For: ?Past Medical History:  ?Diagnosis Date  ? Allergy   ? Environmental, foods: shellfish, lactose intolerant, citrus, peanuts  ? Asthma   ? mild persistent  ? Atrophic vaginitis   ? BRCA gene mutation  negative 2015  ? testing done at [facility] long  ? Diabetes mellitus without complication ([place])   ? Type 2  ? DJD (degenerative joint disease)   ? generalized joint pain, including back  ? Dysrhythmia   ? SVT  ? Family history of malignant neoplasm of breast   ? Fibromyalgia   ? Dr Amil Amen  ? GERD (gastroesophageal reflux disease)   ? Gout   ? Idiopathic, chronic  ? Hiatal hernia   ? History of kidney stones   ? Hyperlipidemia   ? Hypertension   ? Meniere's disease   ? R side worse, L side starting, see's Dr. Benjamine Mola.  ? Palpitations   ? PSVT on Holter monitoring  ? Shortness of breath   ? occ  ? Stroke [facility])   ? TIA  2014  ? SUI (stress urinary incontinence, female)   ? (resolved after surgery)  ? TIA (transient ischemic attack)   ? 07/2012, see Dr Leonie Man  ? Vertigo   ? ? ?Her Past Surgical History Is Significant For: ?Past Surgical History:  ?Procedure Laterality Date  ? ABDOMINAL HYSTERECTOMY  1994  ? TAH,BSO-BLADDER NECK SUSPENSION  ? BREAST SURGERY    ? LUMPECTOMY-BENIGN Breast Bx X2  ? CARPAL TUNNEL RELEASE Right 2005  ? CATARACT EXTRACTION, BILATERAL Bilateral 2016  ? Cataract   ? CHOLECYSTECTOMY, LAPAROSCOPIC  2001  ? [place]  ? EYE SURGERY    ? JOINT REPLACEMENT    ? bilatral  knees;   ? KNEE ARTHROPLASTY  05/19/2012  ? Procedure: COMPUTER ASSISTED TOTAL KNEE ARTHROPLASTY;  Surgeon: Marybelle Killings, MD;  Location: [place];  Service: Orthopedics;  Laterality: Left;  Left  Total Knee Arthroplasty  ? KNEE ARTHROPLASTY Right 09/03/2013  ? Procedure: COMPUTER ASSISTED TOTAL KNEE ARTHROPLASTY;  Surgeon: Marybelle Killings, MD;  Location: [place];  Service: Orthopedics;  Laterality: Right;  Right Total Knee Arthroplasty, Computer Assist  ? KNEE ARTHROSCOPY    ? bil  ? LUMBAR LAMINECTOMY/DECOMPRESSION MICRODISCECTOMY N/A 08/18/2020  ? Procedure: L4-5 decompression, possible left L4-5 microdiscectomy;  Surgeon: Marybelle Killings, MD;  Location: [place];  Service: Orthopedics;  Laterality: N/A;  ? OOPHORECTOMY    ? BSO  ? SURGERY  FOR POSERIOR TIBIAL TENDON  2003  ? TONSILLECTOMY    ? TRIPLE FUSION LEFT ANKLE Left 2007  ? TUBAL LIGATION    ? ? ?Her Family History Is Significant For: ?Family History  ?Problem Relation Age of Onset  ? Breast cancer Mother 34  ?     deceased 72  ? Cancer Father   ?     Brain tumor; path?; deceased 29  ? Breast cancer Sister 51  ?     bilateral breast ca @ 77; BRCA2 positive  ? Hypertension Sister   ? Hemochromatosis Sister   ? Thalassemia Sister   ? Other Sister   ?     negative for BRCA1 BRCA2  ? Heart failure Sister   ?     due to chemo?  ? CAD Son   ?     MI  ? Diabetes Son   ? Leukemia Son   ? Lymphoma Maternal Aunt   ?     deceased 4s  ? Breast cancer Cousin   ?     BRCA2 positive  ? Diabetes Other   ? Breast cancer Other   ?     distant maternal female relatives with breast cancer  ? Colon cancer Neg Hx   ? Colon polyps Neg Hx   ? Esophageal cancer Neg Hx   ? Rectal cancer Neg Hx   ? Stomach cancer Neg Hx   ? ? ?Her Social History Is Significant For: ?Social History  ? ?Socioeconomic History  ? Marital status: Married  ?  Spouse name: [name]  ? Number of children: 2  ? Years of education: 3  ? Highest education level: Not on file  ?Occupational History  ? Occupation: Hospital doctor-- retired   ?  Employer: RETIRED  ?Tobacco Use  ? Smoking status: Never  ? Smokeless tobacco: Never  ?Vaping Use  ? Vaping Use: Never used  ?Substance and Sexual Activity  ? Alcohol use: No  ?  Alcohol/week: 0.0 standard drinks  ? Drug use: No  ? Sexual activity: Not Currently  ?  Birth control/protection: Surgical  ?Other Topics Concern  ? Not on file  ?Social History Narrative  ? HSG  ? 1 year college for accounting. married '69. 2 sons - '70, '72: retired - [employer].    ? ACP - OK for CPR at least once, no long term ventilation, no heroic measures in the face of poor quality of life. HCPOA - husband, secondary son Kristy [name] (c) [phone]  ? No Cafffeine (or very rare).   ?    ? ?Social Determinants of Health  ? ?Financial  Resource Strain: Low Risk   ? Difficulty of Paying Living Expenses: Not hard at all  ?Food Insecurity:  No Food Insecurity  ? Worried About Charity fundraiser in the Last Year: Never true  ? Ran Out of Food in the Last Year: Never true  ?Transportation Needs: No Transportation Needs  ? Lack of Transportation (Medical): No  ? Lack of Transportation (Non-Medical): No  ?Physical Activity: Sufficiently Active  ? Days of Exercise per Week: 7 days  ? Minutes of Exercise per Session: 30 min  ?Stress: No Stress Concern Present  ? Feeling of Stress : Not at all  ?Social Connections: Moderately Isolated  ? Frequency of Communication with Friends and Family: More than three times a week  ? Frequency of Social Gatherings with Friends and Family: More than three times a week  ? Attends Religious Services: Never  ? Active Member of Clubs or Organizations: No  ? Attends Archivist Meetings: Never  ? Marital Status: Married  ? ? ?Her Allergies Are:  ?Allergies  ?Allergen Reactions  ? Iodine Anaphylaxis  ? Shellfish Allergy Anaphylaxis  ? Sulfonamide Derivatives Rash  ? Cephalexin Other (See Comments)  ? Citrus Hives, Itching and Other (See Comments)  ?  Itching in mouth/ blisters on lips  ? Lactose Intolerance (Gi) Diarrhea and Swelling  ?  Swollen mouth  ? Amoxicillin Diarrhea  ?  Did it involve swelling of the face/tongue/throat, SOB, or low BP? No ?Did it involve sudden or severe rash/hives, skin peeling, or any reaction on the inside of your mouth or nose? No ?Did you need to seek medical attention at a hospital or doctor's office? No ?When did it last happen?     75-63 years old  ?If all above answers are ?NO?, may proceed with cephalosporin use.  ?:  ? ?Her Current Medications Are:  ?Outpatient Encounter Medications as of 09/15/2021  ?Medication Sig  ? aspirin EC 81 MG tablet Take 81 mg by mouth daily.  ? atorvastatin (LIPITOR) 10 MG tablet TAKE 1 TABLET BY MOUTH EVERYDAY AT BEDTIME  ? Calcium Carbonate (CALCIUM 600  PO) Take 600 mg by mouth in the morning and at bedtime. (1600 & bedtime)  ? Cholecalciferol 50 MCG (2000 UT) CAPS Take 2,000 Units by mouth at bedtime.   ? diltiazem (CARDIZEM CD) 240 MG 24 hr capsule TAKE 1

## 2021-09-18 ENCOUNTER — Other Ambulatory Visit: Payer: Self-pay

## 2021-09-18 ENCOUNTER — Ambulatory Visit: Payer: Medicare Other

## 2021-09-18 ENCOUNTER — Emergency Department (HOSPITAL_COMMUNITY): Payer: Medicare Other

## 2021-09-18 ENCOUNTER — Ambulatory Visit (INDEPENDENT_AMBULATORY_CARE_PROVIDER_SITE_OTHER): Payer: Medicare Other | Admitting: Surgery

## 2021-09-18 ENCOUNTER — Encounter (HOSPITAL_COMMUNITY): Payer: Self-pay | Admitting: Emergency Medicine

## 2021-09-18 ENCOUNTER — Encounter: Payer: Self-pay | Admitting: Surgery

## 2021-09-18 ENCOUNTER — Emergency Department (HOSPITAL_COMMUNITY)
Admission: EM | Admit: 2021-09-18 | Discharge: 2021-09-18 | Disposition: A | Discharge status: Home / Self Care | Payer: Medicare Other | Attending: Emergency Medicine | Admitting: Emergency Medicine

## 2021-09-18 VITALS — BP 123/72 | HR 90 | Ht 65.0 in | Wt 220.0 lb

## 2021-09-18 DIAGNOSIS — M25562 Pain in left knee: Secondary | ICD-10-CM | POA: Diagnosis not present

## 2021-09-18 DIAGNOSIS — M7989 Other specified soft tissue disorders: Secondary | ICD-10-CM | POA: Diagnosis not present

## 2021-09-18 DIAGNOSIS — S8992XA Unspecified injury of left lower leg, initial encounter: Secondary | ICD-10-CM | POA: Diagnosis present

## 2021-09-18 DIAGNOSIS — W19XXXA Unspecified fall, initial encounter: Secondary | ICD-10-CM

## 2021-09-18 DIAGNOSIS — S8002XA Contusion of left knee, initial encounter: Secondary | ICD-10-CM | POA: Insufficient documentation

## 2021-09-18 DIAGNOSIS — M25572 Pain in left ankle and joints of left foot: Secondary | ICD-10-CM

## 2021-09-18 DIAGNOSIS — M25432 Effusion, left wrist: Secondary | ICD-10-CM | POA: Diagnosis not present

## 2021-09-18 DIAGNOSIS — M25472 Effusion, left ankle: Secondary | ICD-10-CM | POA: Insufficient documentation

## 2021-09-18 DIAGNOSIS — M189 Osteoarthritis of first carpometacarpal joint, unspecified: Secondary | ICD-10-CM | POA: Diagnosis not present

## 2021-09-18 DIAGNOSIS — W1839XA Other fall on same level, initial encounter: Secondary | ICD-10-CM | POA: Diagnosis not present

## 2021-09-18 DIAGNOSIS — M25532 Pain in left wrist: Secondary | ICD-10-CM | POA: Diagnosis not present

## 2021-09-18 NOTE — ED Notes (Signed)
Patient to ER hall bed 20 via wheelchair. Patient reports that at 0230 this morning she was ambulating to restroom and fell down on left knee, ankle, and wrist. Patient denies LOC, head injury, or use of any blood thinner. Patient reports history of bilateral knee replacements many years ago. Patient has some dark purple bruising to anterior left knee. Patient is alert and oriented and calm. Husband at bedside.  ?

## 2021-09-18 NOTE — ED Notes (Signed)
Ortho tech paged for assistance with splint applications. Patient to be discharged following this. ?

## 2021-09-18 NOTE — ED Notes (Signed)
Ortho has applied splints to left wrist, knee, and ankle. Patient verbalizes understanding of discharge instructions and leaves department via wheelchair accompanied by husband as driver to home.  ?

## 2021-09-18 NOTE — ED Triage Notes (Signed)
Pt c/o left knee pain after falling. Pt also c/o left wrist and ankle pain.  ?

## 2021-09-18 NOTE — Discharge Instructions (Signed)
Use Tylenol as needed.  Apply ice to the sore joints.  I recommend that you follow-up with Dr. Lorin Mercy.  Please call his office today.  You may return at any time if your symptoms worsen. ?

## 2021-09-18 NOTE — Progress Notes (Signed)
Office Visit Note   Patient: Kristy [name]           Date of Birth: [birth year]           MRN: [id number] Visit Date: 09/18/2021              Requested by: Colon Branch, [address] [place],  [place] [address] PCP: Colon Branch, MD   Assessment & Plan: Visit Diagnoses:  1. Acute pain of left knee   Possible knee sprain and contusion  Plan: Patient was given a longer knee immobilizer.  Wanted to follow-up with Dr. Lorin Mercy in 1 week for recheck.  Weight-bear as tolerated in the knee immobilizer.  Follow-Up Instructions: Return in about 1 week (around 09/25/2021) for With Dr. Lorin Mercy recheck left knee injury.  Previous total knee replacement.   Orders:  Orders Placed This Encounter  Procedures   XR Knee 1-2 Views Left   No orders of the defined types were placed in this encounter.     Procedures: No procedures performed   Clinical Data: No additional findings.   Subjective: Chief Complaint  Patient presents with   Left Knee - Pain    HPI 76 year old female comes in with complaints of left knee pain..  Patient's had previous left total knee replacement.  Patient states that earlier this morning she had gotten up to use the bathroom and she fell landing on the anterior knee and twisting.  She went to the urgent care.  X-rays were done.  X-ray showed extensive prepatellar soft tissue swelling no displaced fracture or other acute osseous abnormality.  She has had pain with weightbearing. Review of Systems No current complaints of cardiopulmonary GI/GU issues  Objective: Vital Signs: BP 123/72   Pulse 90   Ht _0  (1.651 m)   Wt 220 lb (99.8 kg)   BMI 36.61 kg/m   Physical Exam HENT:     Head: Normocephalic.  Pulmonary:     Effort: No respiratory distress.  Musculoskeletal:     Comments: Patient does have some anterior knee swelling.  Anterior knee is diffusely tender.  Range of motion about 0 to 90 degrees with pain.  Neurological:     Mental  Status: She is alert.     Ortho Exam  Specialty Comments:  No specialty comments available.  Imaging: DG Wrist Complete Left  Result Date: 09/18/2021 CLINICAL DATA:  76 year old female with history of fall complaining of pain and swelling in the left wrist. EXAM: LEFT WRIST - COMPLETE 3+ VIEW COMPARISON:  No priors. FINDINGS: Four views of the left wrist demonstrate no acute displaced fracture, subluxation or dislocation. There is joint space narrowing, subchondral sclerosis, subchondral cyst formation and osteophyte formation, most severe at the first [facility] joint, compatible with advanced osteoarthritis. IMPRESSION: 1. No acute radiographic abnormality of the left wrist. 2. Severe osteoarthritis, most pronounced at the first [facility] joint. Electronically Signed   By: Vinnie Langton M.D.   On: 09/18/2021 05:05   DG Ankle Complete Left  Result Date: 09/18/2021 CLINICAL DATA:  76 year old female with history of trauma from a fall complaining of left ankle pain. EXAM: LEFT ANKLE COMPLETE - 3+ VIEW COMPARISON:  No priors. FINDINGS: Old healed fracture of the distal third of the fibular diaphysis. Numerous orthopedic fixation screws are noted throughout the midfoot and hindfoot, with what appears to be complete bony fusion throughout these regions. No definite acute displaced fracture, subluxation or dislocation is noted. Soft  tissue swelling surrounding the ankle joint, most severe overlying the lateral malleolus. IMPRESSION: 1. Soft tissue swelling surrounding the ankle joint, most severe overlying the lateral malleolus. 2. No definite acute osseous trauma. 3. Postoperative changes of prior midfoot and hindfoot fusion, as above. Electronically Signed   By: Vinnie Langton M.D.   On: 09/18/2021 05:07   DG Knee Complete 4 Views Left  Result Date: 09/18/2021 CLINICAL DATA:  76 year old female with history of fall complaining of left knee pain and swelling. EXAM: LEFT KNEE - COMPLETE 4+ VIEW COMPARISON:   No priors. FINDINGS: Four views of the left knee demonstrate postoperative changes of prior total knee arthroplasty. The femoral and tibial prostheses both appear properly seated without definite periprosthetic fracture or other acute abnormality. Alignment is anatomic. Extensive soft tissue swelling is noted anterior to the patella. Soft tissues are otherwise unremarkable. IMPRESSION: 1. Extensive prepatellar soft tissue swelling. No displaced fracture or other acute osseous abnormality. 2. Status post left total knee arthroplasty. Electronically Signed   By: Vinnie Langton M.D.   On: 09/18/2021 05:04     PMFS History: Patient Active Problem List   Diagnosis Date Noted   Status post lumbar spine surgery for decompression of spinal cord 08/26/2020   Lumbar disc herniation 05/28/2020   Spinal stenosis of lumbar region 05/28/2020   H/O vitamin D deficiency 09/21/2018   Elevated LFTs 12/25/2015   Dizzy 07/31/2015   PCP  notes >>>>>>>>>>>>>>>> 02/11/2015   Hyperparathyroidism ([place]) 06/07/2014   Benign paroxysmal positional vertigo 02/19/2014   FHx: BRCA2 gene positive    Family history of malignant neoplasm of breast    Family history of breast cancer 01/10/2014   Tachycardia 11/19/2013   Elevated LDL cholesterol level 06/11/2013   Obesity, Class I, BMI 30.0-34.9 (see actual BMI) 06/11/2013   Osteopenia 06/06/2013   Menopause 01/09/2013   History of TIA (transient ischemic attack) 07/15/2012   Annual physical exam 05/06/2011   SUI (stress urinary incontinence, female)    Atrophic vaginitis    GOUT, UNSPECIFIED 04/28/2010   Diabetes ([place]) 04/28/2010   Meniere's disease 07/30/2008   Hearing loss 07/30/2008   Essential hypertension 08/19/2007   ALLERGIC RHINITIS 08/19/2007   ASTHMA 08/19/2007   ACID REFLUX DISEASE 08/19/2007   IRRITABLE BOWEL SYNDROME 08/19/2007   DEGENERATIVE [place] DISEASE 08/19/2007   FIBROMYALGIA 08/19/2007   FATIGUE, CHRONIC 08/19/2007   Past Medical History:   Diagnosis Date   Allergy    Environmental, foods: shellfish, lactose intolerant, citrus, peanuts   Asthma    mild persistent   Atrophic vaginitis    BRCA gene mutation negative 2015   testing done at [place]   Diabetes mellitus without complication ([facility])    Type 2   DJD (degenerative joint disease)    generalized joint pain, including back   Dysrhythmia    SVT   Family history of malignant neoplasm of breast    Fibromyalgia    Dr Amil Amen   GERD (gastroesophageal reflux disease)    Gout    Idiopathic, chronic   Hiatal hernia    History of kidney stones    Hyperlipidemia    Hypertension    Meniere's disease    R side worse, L side starting, see's Dr. Benjamine Mola.   Palpitations    PSVT on Holter monitoring   Shortness of breath    occ   Stroke [facility])    TIA  2014   SUI (stress urinary incontinence, female)    (resolved after surgery)  TIA (transient ischemic attack)    07/2012, see Dr Leonie Man   Vertigo     Family History  Problem Relation Age of Onset   Breast cancer Mother 49       deceased 29   Cancer Father        Brain tumor; path?; deceased 27   Breast cancer Sister 61       bilateral breast ca @ 75; BRCA2 positive   Hypertension Sister    Hemochromatosis Sister    Thalassemia Sister    Other Sister        negative for BRCA1 BRCA2   Heart failure Sister        due to chemo?   CAD Son        MI   Diabetes Son    Leukemia Son    Lymphoma Maternal Aunt        deceased 39s   Breast cancer Cousin        BRCA2 positive   Diabetes Other    Breast cancer Other        distant maternal female relatives with breast cancer   Colon cancer Neg Hx    Colon polyps Neg Hx    Esophageal cancer Neg Hx    Rectal cancer Neg Hx    Stomach cancer Neg Hx     Past Surgical History:  Procedure Laterality Date   ABDOMINAL HYSTERECTOMY  1994   TAH,BSO-BLADDER NECK SUSPENSION   BREAST SURGERY     LUMPECTOMY-BENIGN Breast Bx X2   CARPAL TUNNEL RELEASE Right 2005   CATARACT  EXTRACTION, BILATERAL Bilateral 2016   Cataract    CHOLECYSTECTOMY, LAPAROSCOPIC  2001   CYSTOCELE REPAIR  1994   EYE SURGERY     JOINT REPLACEMENT     bilatral knees;    KNEE ARTHROPLASTY  05/19/2012   Procedure: COMPUTER ASSISTED TOTAL KNEE ARTHROPLASTY;  Surgeon: Marybelle Killings, MD;  Location: [place];  Service: Orthopedics;  Laterality: Left;  Left  Total Knee Arthroplasty   KNEE ARTHROPLASTY Right 09/03/2013   Procedure: COMPUTER ASSISTED TOTAL KNEE ARTHROPLASTY;  Surgeon: Marybelle Killings, MD;  Location: [place];  Service: Orthopedics;  Laterality: Right;  Right Total Knee Arthroplasty, Computer Assist   KNEE ARTHROSCOPY     bil   LUMBAR LAMINECTOMY/DECOMPRESSION MICRODISCECTOMY N/A 08/18/2020   Procedure: L4-5 decompression, possible left L4-5 microdiscectomy;  Surgeon: Marybelle Killings, MD;  Location: [place];  Service: Orthopedics;  Laterality: N/A;   OOPHORECTOMY     BSO   SURGERY FOR POSERIOR TIBIAL TENDON  2003   TONSILLECTOMY     TRIPLE FUSION LEFT ANKLE Left 2007   TUBAL LIGATION     Social History   Occupational History   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

## 2021-09-18 NOTE — ED Provider Notes (Signed)
?[facility] DEPT ?[facility] Emergency Department ?Provider Note ?MRN:  [id number]  ?Arrival date & time: 09/18/21    ? ?Chief Complaint   ?Knee Pain ?  ?History of Present Illness   ?Kristy [name] is a 76 y.o. year-old female presents to the ED with chief complaint of fall from standing.  Patient states that she had gotten up to use the bathroom and stumbled.  She landed on her left knee and twisted her left ankle and injured her left wrist.  She reports that she has been able to stand since the fall but it is painful.  She reports multiple prior joint surgeries.  She has tried ice with some relief.  Denies any other injuries. ? ?History provided by patient. ? ? ?Review of Systems  ?Pertinent review of systems noted in HPI.  ? ? ?Physical Exam  ? ?Vitals:  ? 09/18/21 0427  ?BP: (!) 127/57  ?Pulse: 89  ?Resp: 18  ?Temp: 98 ?F (36.7 ?C)  ?SpO2: 98%  ?  ?CONSTITUTIONAL:  well-appearing, NAD ?NEURO:  Alert and oriented x 3, CN 3-12 grossly intact ?EYES:  eyes equal and reactive ?ENT/NECK:  Supple, no stridor  ?CARDIO:  appears well-perfused  ?PULM:  No respiratory distress ?GI/GU:  non-distended,  ?MSK/SPINE: 5 x 5 cm contusion/hematoma to left anterior knee, nonexpanding, range of motion decreased secondary to pain, moderate left ankle swelling, mild left wrist swelling.  No bony tenderness. ?SKIN:  no rash, atraumatic ? ? ?*Additional and/or pertinent findings included in MDM below ? ?Diagnostic and Interventional Summary  ? ? EKG Interpretation ? ?Date/Time:    ?Ventricular Rate:    ?PR Interval:    ?QRS Duration:   ?QT Interval:    ?QTC Calculation:   ?R Axis:     ?Text Interpretation:   ?  ? ?  ? ?Labs Reviewed - No data to display  ?DG Knee Complete 4 Views Left  ?Final Result  ?  ?DG Wrist Complete Left  ?Final Result  ?  ?DG Ankle Complete Left  ?Final Result  ?  ?  ?Medications - No data to display  ? ?Procedures  /  Critical Care ?Procedures ? ?ED Course and Medical Decision Making  ?I have reviewed  the triage vital signs, the nursing notes, and pertinent available records from the EMR. ? ?Complexity of Problems Addressed: ?Moderate Complexity: Acute complicated illness or injury, requiring diagnostic workup as ordered and performed below. ?Comorbidities affecting this illness/injury include: ? ?Social Determinants Affecting Care: ? No clinically significant social determinants affecting this chief complaint.. ? ? ?ED Course: ?After considering the following differential, trauma from fall, I ordered knee immobilizer, ankle ASO, and left wrist splint. ?I visualized the extremity x-ray which is notable for no fracture or dislocation, but there is soft tissue swelling and arthritic changes of the knee, ankle, and left wrist and agree with the radiologist interpretation.. ? ?  ? ?Consultants: ?No consultations were needed in caring for this patient. ?Patient advised to follow-up with her orthopedic, Dr. Lorin Mercy. ? ?Treatment and Plan: ?Patient treated with splints and braces in the ED.  She states that she is comfortable with discharge.  I offered pain medicine, but patient states that she will use Tylenol and ice. ? ?Emergency department workup does not suggest an emergent condition requiring admission or immediate intervention beyond  what has been performed at this time. The patient is safe for discharge and has  been instructed to return immediately for worsening symptoms, change in  symptoms  or any other concerns ? ? ? ?Final Clinical Impressions(s) / ED Diagnoses  ? ?  ICD-10-CM   ?1. Fall, initial encounter  W19.Merril Abbe   ?  ?2. Acute pain of left knee  M25.562   ?  ?3. Acute left ankle pain  M25.572   ?  ?4. Left wrist pain  M25.532   ?  ?  ?ED Discharge Orders   ? ? None  ? ?  ?  ? ? ?Discharge Instructions Discussed with and Provided to Patient:  ? ? ? ?Discharge Instructions   ? ?  ?Use Tylenol as needed.  Apply ice to the sore joints.  I recommend that you follow-up with Dr. Lorin Mercy.  Please call his office  today.  You may return at any time if your symptoms worsen. ? ? ? ? ?  ?Montine Circle, PA-C ?09/18/21 6168 ? ?  ?Quintella Reichert, MD ?09/18/21 [phone] ? ?

## 2021-09-18 NOTE — Progress Notes (Signed)
Orthopedic Tech Progress Note ?Patient Details:  ?LAFAWN LENOIR ?[birth year] ?[id number] ? ?Ortho Devices ?Type of Ortho Device: Knee Immobilizer, Wrist splint ?Ortho Device/Splint Location: lle,lue ?Ortho Device/Splint Interventions: Ordered, Application, Adjustment ?  ?Post Interventions ?Patient Tolerated: Well ? ?Edwina Barth ?09/18/2021, 6:55 AM ? ?

## 2021-09-21 ENCOUNTER — Ambulatory Visit (INDEPENDENT_AMBULATORY_CARE_PROVIDER_SITE_OTHER): Payer: Medicare Other | Admitting: Neurology

## 2021-09-21 DIAGNOSIS — R519 Headache, unspecified: Secondary | ICD-10-CM

## 2021-09-21 DIAGNOSIS — E669 Obesity, unspecified: Secondary | ICD-10-CM

## 2021-09-21 DIAGNOSIS — R0683 Snoring: Secondary | ICD-10-CM

## 2021-09-21 DIAGNOSIS — G4733 Obstructive sleep apnea (adult) (pediatric): Secondary | ICD-10-CM

## 2021-09-21 DIAGNOSIS — G4719 Other hypersomnia: Secondary | ICD-10-CM

## 2021-09-21 DIAGNOSIS — R351 Nocturia: Secondary | ICD-10-CM

## 2021-09-21 DIAGNOSIS — G4720 Circadian rhythm sleep disorder, unspecified type: Secondary | ICD-10-CM

## 2021-09-21 DIAGNOSIS — G4734 Idiopathic sleep related nonobstructive alveolar hypoventilation: Secondary | ICD-10-CM

## 2021-09-21 DIAGNOSIS — Z820 Family history of epilepsy and other diseases of the nervous system: Secondary | ICD-10-CM

## 2021-09-23 ENCOUNTER — Ambulatory Visit: Payer: Medicare Other | Admitting: Physical Therapy

## 2021-09-29 ENCOUNTER — Ambulatory Visit (INDEPENDENT_AMBULATORY_CARE_PROVIDER_SITE_OTHER): Payer: Medicare Other | Admitting: Orthopaedic Surgery

## 2021-09-29 ENCOUNTER — Encounter: Payer: Self-pay | Admitting: Orthopaedic Surgery

## 2021-09-29 DIAGNOSIS — M7042 Prepatellar bursitis, left knee: Secondary | ICD-10-CM

## 2021-09-29 MED ORDER — LIDOCAINE HCL 1 % IJ SOLN
0.5000 mL | INTRAMUSCULAR | Status: AC | PRN
  Administered 2021-09-29: .5 mL

## 2021-09-29 NOTE — Progress Notes (Signed)
? ?Office Visit Note ?  ?Patient: Kristy [name]           ?Date of Birth: [birth year]           ?MRN: [id number] ?Visit Date: 09/29/2021 ?             ?Requested by: Colon Branch, MD ?[address] ?[address] ?[place],  [place] [id number] ?PCP: Colon Branch, MD ? ? ?Assessment & Plan: ?Visit Diagnoses:  ?1. Prepatellar bursitis, left knee   ?      With prepatellar bursal hematoma ? ?Plan: Prepatellar bursal abscess ration reveals just old blood 2 cc obtained.  She has some clot present and I was unable to express any through the 18-gauge needle.  She will keep her leg elevated use her knee immobilizer stay off of it I will recheck her in 1 week.  If she starts developing drainage we discussed she needs to go back to the operating room and have a VAC applied after washout of the prepatellar bursal hematoma before the midportion of the total knee arthroplasty wound dehiscence. ? ?Follow-Up Instructions: Return in about 1 week (around 10/06/2021).  ? ?Orders:  ?Orders Placed This Encounter  ?Procedures  ? Large Joint Inj  ? ?No orders of the defined types were placed in this encounter. ? ? ? ? Procedures: ?Large Joint Inj: L knee on 09/29/2021 2:46 PM ?Indications: joint swelling and pain ?Details: 22 G 1.5 in needle, anterolateral approach ? ?Arthrogram: No ? ?Medications: 0.5 mL lidocaine 1 % ?Aspirate: 2 mL bloody ?Outcome: tolerated well, no immediate complications ?Procedure, treatment alternatives, risks and benefits explained, specific risks discussed. Consent was given by the patient. Immediately prior to procedure a time out was called to verify the correct patient, procedure, equipment, support staff and site/side marked as required. Patient was prepped and draped in the usual sterile fashion.  ? ? ? ? ?Clinical Data: ?No additional findings. ? ? ?Subjective: ?Chief Complaint  ?Patient presents with  ? Left Knee - Pain, Follow-up  ?  Fall 09/18/2021  ? ? ?HPI 76 year old female 70 years post left total knee  arthroplasty.  Patient fell 09/18/2021 10 days ago landing on her left knee with prepatellar bursal hematoma.  She had swelling had an Ace wrap then later had development of blisters which have popped.  She has significant pain is not having any wound drainage but has small eschar at the midline incision 2 separate areas.  She has a knee immobilizer but has not been using it.  She is amatory with a rolling walker.  She denies fever or chills.  X-rays were reviewed from 09/18/2021 which shows no evidence of fracture but prepatellar bursal swelling over the patella and over the patellar tendon. ? ?Review of Systems updated unchanged. ? ? ?Objective: ?Vital Signs: BP (!) 105/57   Pulse 82   Ht '5\' 5"'  (1.651 m)   Wt 220 lb (99.8 kg)   BMI 36.61 kg/m?  ? ?Physical Exam ?Constitutional:   ?   Appearance: She is well-developed.  ?HENT:  ?   Head: Normocephalic.  ?   Right Ear: External ear normal.  ?   Left Ear: External ear normal. There is no impacted cerumen.  ?Eyes:  ?   Pupils: Pupils are equal, round, and reactive to light.  ?Neck:  ?   Thyroid: No thyromegaly.  ?   Trachea: No tracheal deviation.  ?Cardiovascular:  ?   Rate and Rhythm: Normal rate.  ?  Pulmonary:  ?   Effort: Pulmonary effort is normal.  ?Abdominal:  ?   Palpations: Abdomen is soft.  ?Musculoskeletal:  ?   Cervical back: No rigidity.  ?Skin: ?   General: Skin is warm and dry.  ?Neurological:  ?   Mental Status: She is alert and oriented to person, place, and time.  ?Psychiatric:     ?   Behavior: Behavior normal.  ? ? ?Ortho Exam prepatellar bursal hematoma.  Skin is tense shiny.  4 small blisters have popped and are in healing phase. ? ?Specialty Comments:  ?No specialty comments available. ? ?Imaging: ?No results found. ? ? ?PMFS History: ?Patient Active Problem List  ? Diagnosis Date Noted  ? Prepatellar bursitis, left knee 09/29/2021  ? Status post lumbar spine surgery for decompression of spinal cord 08/26/2020  ? Lumbar disc herniation  05/28/2020  ? Spinal stenosis of lumbar region 05/28/2020  ? H/O vitamin D deficiency 09/21/2018  ? Elevated LFTs 12/25/2015  ? Dizzy 07/31/2015  ? PCP  notes >>>>>>>>>>>>>>>> 02/11/2015  ? Hyperparathyroidism ([place]) 06/07/2014  ? Benign paroxysmal positional vertigo 02/19/2014  ? FHx: BRCA2 gene positive   ? Family history of malignant neoplasm of breast   ? Family history of breast cancer 01/10/2014  ? Tachycardia 11/19/2013  ? Elevated LDL cholesterol level 06/11/2013  ? Obesity, Class I, BMI 30.0-34.9 (see actual BMI) 06/11/2013  ? Osteopenia 06/06/2013  ? Menopause 01/09/2013  ? History of TIA (transient ischemic attack) 07/15/2012  ? Annual physical exam 05/06/2011  ? SUI (stress urinary incontinence, female)   ? Atrophic vaginitis   ? GOUT, UNSPECIFIED 04/28/2010  ? Diabetes ([place]) 04/28/2010  ? Meniere's disease 07/30/2008  ? Hearing loss 07/30/2008  ? Essential hypertension 08/19/2007  ? ALLERGIC RHINITIS 08/19/2007  ? ASTHMA 08/19/2007  ? ACID REFLUX DISEASE 08/19/2007  ? IRRITABLE BOWEL SYNDROME 08/19/2007  ? [place] DISEASE 08/19/2007  ? FIBROMYALGIA 08/19/2007  ? FATIGUE, CHRONIC 08/19/2007  ? ?Past Medical History:  ?Diagnosis Date  ? Allergy   ? Environmental, foods: shellfish, lactose intolerant, citrus, peanuts  ? Asthma   ? mild persistent  ? Atrophic vaginitis   ? BRCA gene mutation negative 2015  ? testing done at [facility] long  ? Diabetes mellitus without complication ([place])   ? Type 2  ? DJD (degenerative joint disease)   ? generalized joint pain, including back  ? Dysrhythmia   ? SVT  ? Family history of malignant neoplasm of breast   ? Fibromyalgia   ? Dr Amil Amen  ? GERD (gastroesophageal reflux disease)   ? Gout   ? Idiopathic, chronic  ? Hiatal hernia   ? History of kidney stones   ? Hyperlipidemia   ? Hypertension   ? Meniere's disease   ? R side worse, L side starting, see's Dr. Benjamine Mola.  ? Palpitations   ? PSVT on Holter monitoring  ? Shortness of breath   ? occ  ? Stroke [facility])   ? TIA   2014  ? SUI (stress urinary incontinence, female)   ? (resolved after surgery)  ? TIA (transient ischemic attack)   ? 07/2012, see Dr Leonie Man  ? Vertigo   ?  ?Family History  ?Problem Relation Age of Onset  ? Breast cancer Mother 16  ?     deceased 54  ? Cancer Father   ?     Brain tumor; path?; deceased 84  ? Breast cancer Sister 56  ?     bilateral breast  ca @ 60; BRCA2 positive  ? Hypertension Sister   ? Hemochromatosis Sister   ? Thalassemia Sister   ? Other Sister   ?     negative for BRCA1 BRCA2  ? Heart failure Sister   ?     due to chemo?  ? CAD Son   ?     MI  ? Diabetes Son   ? Leukemia Son   ? Lymphoma Maternal Aunt   ?     deceased 63s  ? Breast cancer Cousin   ?     BRCA2 positive  ? Diabetes Other   ? Breast cancer Other   ?     distant maternal female relatives with breast cancer  ? Colon cancer Neg Hx   ? Colon polyps Neg Hx   ? Esophageal cancer Neg Hx   ? Rectal cancer Neg Hx   ? Stomach cancer Neg Hx   ?  ?Past Surgical History:  ?Procedure Laterality Date  ? ABDOMINAL HYSTERECTOMY  1994  ? TAH,BSO-BLADDER NECK SUSPENSION  ? BREAST SURGERY    ? LUMPECTOMY-BENIGN Breast Bx X2  ? CARPAL TUNNEL RELEASE Right 2005  ? CATARACT EXTRACTION, BILATERAL Bilateral 2016  ? Cataract   ? CHOLECYSTECTOMY, LAPAROSCOPIC  2001  ? [place]  ? EYE SURGERY    ? JOINT REPLACEMENT    ? bilatral knees;   ? KNEE ARTHROPLASTY  05/19/2012  ? Procedure: COMPUTER ASSISTED TOTAL KNEE ARTHROPLASTY;  Surgeon: Marybelle Killings, MD;  Location: [facility];  Service: Orthopedics;  Laterality: Left;  Left  Total Knee Arthroplasty  ? KNEE ARTHROPLASTY Right 09/03/2013  ? Procedure: COMPUTER ASSISTED TOTAL KNEE ARTHROPLASTY;  Surgeon: Marybelle Killings, MD;  Location: [facility];  Service: Orthopedics;  Laterality: Right;  Right Total Knee Arthroplasty, Computer Assist  ? KNEE ARTHROSCOPY    ? bil  ? LUMBAR LAMINECTOMY/DECOMPRESSION MICRODISCECTOMY N/A 08/18/2020  ? Procedure: L4-5 decompression, possible left L4-5 microdiscectomy;  Surgeon:  Marybelle Killings, MD;  Location: [facility];  Service: Orthopedics;  Laterality: N/A;  ? OOPHORECTOMY    ? BSO  ? SURGERY FOR POSERIOR TIBIAL TENDON  2003  ? TONSILLECTOMY    ? TRIPLE FUSION LEFT ANKLE Left 2007  ? TUBAL LIGATION    ?

## 2021-10-01 ENCOUNTER — Ambulatory Visit (INDEPENDENT_AMBULATORY_CARE_PROVIDER_SITE_OTHER): Payer: Medicare Other

## 2021-10-01 ENCOUNTER — Ambulatory Visit: Payer: Medicare Other

## 2021-10-01 VITALS — BP 114/75 | HR 81 | Temp 98.2°F | Resp 16 | Ht 65.0 in | Wt 220.0 lb

## 2021-10-01 DIAGNOSIS — Z0000 Encounter for general adult medical examination without abnormal findings: Secondary | ICD-10-CM | POA: Diagnosis not present

## 2021-10-01 NOTE — Patient Instructions (Signed)
Ms. Bieser , ?Thank you for taking time to come for your Medicare Wellness Visit. I appreciate your ongoing commitment to your health goals. Please review the following plan we discussed and let me know if I can assist you in the future.  ? ?Screening recommendations/referrals: ?Colonoscopy: 03/24/21 due 03/24/24 ?Mammogram: 04/08/21 due 04/08/22 ?Bone Density: 01/02/19 due 01/01/21 ?Recommended yearly ophthalmology/optometry visit for glaucoma screening and checkup ?Recommended yearly dental visit for hygiene and checkup ? ?Vaccinations: ?Influenza vaccine: up to date ?Pneumococcal vaccine: up to date ?Tdap vaccine: up to date ?Shingles vaccine: Due-May obtain vaccine at our office or your local pharmacy.    ?Covid-19:completed ? ?Advanced directives: yes.not on fil ? ?Conditions/risks identified: see problem list ? ?Next appointment: Follow up in one year for your annual wellness visit  ? ? ?Preventive Care 1 Years and Older, Female ?Preventive care refers to lifestyle choices and visits with your health care provider that can promote health and wellness. ?What does preventive care include? ?A yearly physical exam. This is also called an annual well check. ?Dental exams once or twice a year. ?Routine eye exams. Ask your health care provider how often you should have your eyes checked. ?Personal lifestyle choices, including: ?Daily care of your teeth and gums. ?Regular physical activity. ?Eating a healthy diet. ?Avoiding tobacco and drug use. ?Limiting alcohol use. ?Practicing safe sex. ?Taking low-dose aspirin every day. ?Taking vitamin and mineral supplements as recommended by your health care provider. ?What happens during an annual well check? ?The services and screenings done by your health care provider during your annual well check will depend on your age, overall health, lifestyle risk factors, and family history of disease. ?Counseling  ?Your health care provider may ask you questions about your: ?Alcohol  use. ?Tobacco use. ?Drug use. ?Emotional well-being. ?Home and relationship well-being. ?Sexual activity. ?Eating habits. ?History of falls. ?Memory and ability to understand (cognition). ?Work and work Statistician. ?Reproductive health. ?Screening  ?You may have the following tests or measurements: ?Height, weight, and BMI. ?Blood pressure. ?Lipid and cholesterol levels. These may be checked every 5 years, or more frequently if you are over 90 years old. ?Skin check. ?Lung cancer screening. You may have this screening every year starting at age 37 if you have a 30-pack-year history of smoking and currently smoke or have quit within the past 15 years. ?Fecal occult blood test (FOBT) of the stool. You may have this test every year starting at age 24. ?Flexible sigmoidoscopy or colonoscopy. You may have a sigmoidoscopy every 5 years or a colonoscopy every 10 years starting at age 75. ?Hepatitis C blood test. ?Hepatitis B blood test. ?Sexually transmitted disease (STD) testing. ?Diabetes screening. This is done by checking your blood sugar (glucose) after you have not eaten for a while (fasting). You may have this done every 1-3 years. ?Bone density scan. This is done to screen for osteoporosis. You may have this done starting at age 65. ?Mammogram. This may be done every 1-2 years. Talk to your health care provider about how often you should have regular mammograms. ?Talk with your health care provider about your test results, treatment options, and if necessary, the need for more tests. ?Vaccines  ?Your health care provider may recommend certain vaccines, such as: ?Influenza vaccine. This is recommended every year. ?Tetanus, diphtheria, and acellular pertussis (Tdap, Td) vaccine. You may need a Td booster every 10 years. ?Zoster vaccine. You may need this after age 67. ?Pneumococcal 13-valent conjugate (PCV13) vaccine. One dose is recommended  after age 75. ?Pneumococcal polysaccharide (PPSV23) vaccine. One dose is  recommended after age 60. ?Talk to your health care provider about which screenings and vaccines you need and how often you need them. ?This information is not intended to replace advice given to you by your health care provider. Make sure you discuss any questions you have with your health care provider. ?Document Released: 06/13/2015 Document Revised: 02/04/2016 Document Reviewed: 03/18/2015 ?Elsevier Interactive Patient Education ? 2017 [place]. ? ?Fall Prevention in the Home ?Falls can cause injuries. They can happen to people of all ages. There are many things you can do to make your home safe and to help prevent falls. ?What can I do on the outside of my home? ?Regularly fix the edges of walkways and driveways and fix any cracks. ?Remove anything that might make you trip as you walk through a door, such as a raised step or threshold. ?Trim any bushes or trees on the path to your home. ?Use bright outdoor lighting. ?Clear any walking paths of anything that might make someone trip, such as rocks or tools. ?Regularly check to see if handrails are loose or broken. Make sure that both sides of any steps have handrails. ?Any raised decks and porches should have guardrails on the edges. ?Have any leaves, snow, or ice cleared regularly. ?Use sand or salt on walking paths during winter. ?Clean up any spills in your garage right away. This includes oil or grease spills. ?What can I do in the bathroom? ?Use night lights. ?Install grab bars by the toilet and in the tub and shower. Do not use towel bars as grab bars. ?Use non-skid mats or decals in the tub or shower. ?If you need to sit down in the shower, use a plastic, non-slip stool. ?Keep the floor dry. Clean up any water that spills on the floor as soon as it happens. ?Remove soap buildup in the tub or shower regularly. ?Attach bath mats securely with double-sided non-slip rug tape. ?Do not have throw rugs and other things on the floor that can make you  trip. ?What can I do in the bedroom? ?Use night lights. ?Make sure that you have a light by your bed that is easy to reach. ?Do not use any sheets or blankets that are too big for your bed. They should not hang down onto the floor. ?Have a firm chair that has side arms. You can use this for support while you get dressed. ?Do not have throw rugs and other things on the floor that can make you trip. ?What can I do in the kitchen? ?Clean up any spills right away. ?Avoid walking on wet floors. ?Keep items that you use a lot in easy-to-reach places. ?If you need to reach something above you, use a strong step stool that has a grab bar. ?Keep electrical cords out of the way. ?Do not use floor polish or wax that makes floors slippery. If you must use wax, use non-skid floor wax. ?Do not have throw rugs and other things on the floor that can make you trip. ?What can I do with my stairs? ?Do not leave any items on the stairs. ?Make sure that there are handrails on both sides of the stairs and use them. Fix handrails that are broken or loose. Make sure that handrails are as long as the stairways. ?Check any carpeting to make sure that it is firmly attached to the stairs. Fix any carpet that is loose or worn. ?Avoid having throw  rugs at the top or bottom of the stairs. If you do have throw rugs, attach them to the floor with carpet tape. ?Make sure that you have a light switch at the top of the stairs and the bottom of the stairs. If you do not have them, ask someone to add them for you. ?What else can I do to help prevent falls? ?Wear shoes that: ?Do not have high heels. ?Have rubber bottoms. ?Are comfortable and fit you well. ?Are closed at the toe. Do not wear sandals. ?If you use a stepladder: ?Make sure that it is fully opened. Do not climb a closed stepladder. ?Make sure that both sides of the stepladder are locked into place. ?Ask someone to hold it for you, if possible. ?Clearly mark and make sure that you can  see: ?Any grab bars or handrails. ?First and last steps. ?Where the edge of each step is. ?Use tools that help you move around (mobility aids) if they are needed. These include: ?Canes. ?Walkers. ?Scooters. ?Crutches. ?Turn on

## 2021-10-01 NOTE — Addendum Note (Signed)
Addended by: Star Age on: 10/01/2021 06:00 PM ? ? Modules accepted: Orders ? ?

## 2021-10-01 NOTE — Procedures (Signed)
PATIENT'S NAME:  Kristy [name], Kristy [name] ?DOB:      1945-11-29      ?MR#:    [id number]     ?DATE OF RECORDING: 09/21/2021 ?REFERRING M.D.:  Kathlene November, MD ?Study Performed:   Baseline Polysomnogram ?HISTORY:76 year old woman with a history of TIA, stroke, carotid artery stenosis, dizziness, asthma, allergies, diabetes, degenerative joint disease, fibromyalgia, vertigo, reflux disease, gout, history of kidney stones, hypertension, hyperlipidemia, palpitations with history of PSVT, and obesity, who reports snoring and excessive daytime somnolence.  ?The patient endorsed the Epworth Sleepiness Scale at 20 points. BMI of 36.7 kg/m2. The patient's neck circumference measured 15 inches. ? ?CURRENT MEDICATIONS: Aspirin, Lipitor, Calcium, Cholecalciferol, Cardizem CD, Jardiance, Epi-Pen, Uloric, Flonase, Flovent HFA, Xopenec HFA, Xyzal, Cozaar, Glucophage, Toprol-XL, Pamelor, Prilosec, Actos, Vitamin E ?  ?PROCEDURE:  This is a multichannel digital polysomnogram utilizing the Somnostar 11.2 system.  Electrodes and sensors were applied and monitored per AASM Specifications.   EEG, EOG, Chin and Limb EMG, were sampled at 200 Hz.  ECG, Snore and Nasal Pressure, Thermal Airflow, Respiratory Effort, CPAP Flow and Pressure, Oximetry was sampled at 50 Hz. Digital video and audio were recorded.     ? ?BASELINE STUDY ? ?Lights Out was at 21:39 and Lights On at 04:54.  Total recording time (TRT) was 435.5 minutes, with a total sleep time (TST) of 328 minutes.   The patient's sleep latency was 41.5 minutes, which is delayed. REM latency was 169 minutes, which is delayed. The sleep efficiency was 75.3%, which is reduced.  ?   ?SLEEP ARCHITECTURE: WASO (Wake after sleep onset) was 76.5 minute with mild to moderate sleep fragmentation noted.  There were 44 minutes in Stage N1, 186 minutes Stage N2, 68 minutes Stage N3 and 30 minutes in Stage REM.  The percentage of Stage N1 was 13.4%, which is increased, Stage N2 was 56.7%, which is mildly increased,  Stage N3 was 20.7%, which is normal, and Stage R (REM sleep) was 9.1%, which is reduced. The arousals were noted as: 112 were spontaneous, 0 were associated with PLMs, 17 were associated with respiratory events. ? ?RESPIRATORY ANALYSIS:  There were a total of 63 respiratory events:  0 obstructive apneas, 0 central apneas and 0 mixed apneas with a total of 0 apneas and an apnea index (AI) of 0 /hour. There were 63 hypopneas with a hypopnea index of 11.5 /hour. The patient also had 0 respiratory event related arousals (RERAs).  ?    ?The total APNEA/HYPOPNEA INDEX (AHI) was 11.5/hour and the total RESPIRATORY DISTURBANCE INDEX was  11.5 /hour.  34 events occurred in REM sleep and 58 events in NREM. The REM AHI was  68 /hour, versus a non-REM AHI of 5.8. The patient spent 214.5 minutes of total sleep time in the supine position and 114 minutes in non-supine.. The supine AHI was 16.5 versus a non-supine AHI of 2.1. ? ?OXYGEN SATURATION & C02:  The Wake baseline 02 saturation was 94%, with the lowest being 82%. Time spent below 89% saturation equaled 62 minutes. The patient was started on supplemental oxygen at 1 lpm at 00:41, epoch 370, due to an average asleep saturation of 87%, after which her average oxygen saturation improved to 90%. ?  ?PERIODIC LIMB MOVEMENTS: The patient had a total of 0 Periodic Limb Movements.   ? ?Audio and video analysis did not show any abnormal or unusual movements, behaviors, phonations or vocalizations. The patient took 1 bathroom break. Mild to moderate snoring was noted. The EKG  was in keeping with normal sinus rhythm (NSR). ?Post-study, the patient indicated that sleep was better than usual.  ? ?IMPRESSION: ?Obstructive Sleep Apnea (OSA) ?Dysfunctions associated with sleep stages or arousal from sleep ?Nocturnal hypoxemia ? ?RECOMMENDATIONS: ?This study demonstrates overall mild obstructive sleep apnea, severe in REM sleep with a total AHI of 11.5/hour, REM AHI of 68/hour, and O2 nadir  of 82%. The patient had lower average oxygen saturations which sleep and was started on supplemental oxygen at 1 lpm. Given the patient's medical history and sleep related complaints, treatment with positive airway pressure is recommended; this can be achieved in the form of autoPAP. Alternatively, a full-night CPAP titration study would allow optimization of therapy if needed, down the road. Treatment of her underlying asthma should be optimized if needed. Concomitant weight loss is highly recommended. Other treatment options may include the use of an oral appliance.    ?Please note that untreated obstructive sleep apnea may carry additional perioperative morbidity. Patients with significant obstructive sleep apnea should receive perioperative PAP therapy and the surgeons and particularly the anesthesiologist should be informed of the diagnosis and the severity of the sleep disordered breathing. ?This study shows sleep fragmentation and abnormal sleep stage percentages; these are nonspecific findings and per se do not signify an intrinsic sleep disorder or a cause for the patient's sleep-related symptoms. Causes include (but are not limited to) the first night effect of the sleep study, circadian rhythm disturbances, medication effect or an underlying mood disorder or medical problem.  ?The patient should be cautioned not to drive, work at heights, or operate dangerous or heavy equipment when tired or sleepy. Review and reiteration of good sleep hygiene measures should be pursued with any patient. ?The patient will be seen in follow-up by Dr. Rexene Alberts at [facility] for discussion of the test results and further management strategies. The referring provider will be notified of the test results. ? ?I certify that I have reviewed the entire raw data recording prior to the issuance of this report in accordance with the Standards of Accreditation of the [employer] (AASM) ? ?Star Age, MD, PhD ?Diplomat,  American Board of Neurology and Sleep Medicine (Neurology and Sleep Medicine) ?

## 2021-10-01 NOTE — Progress Notes (Signed)
? ?Subjective:  ? Kristy [name] is a 76 y.o. female who presents for Medicare Annual (Subsequent) preventive examination. ? ?Review of Systems    ?Cardiac Risk Factors include: diabetes mellitus;hypertension;advanced age (>81mn, >>29women) ? ?   ?Objective:  ?  ?Today's Vitals  ? 10/01/21 1437 10/01/21 1446  ?BP: 114/75   ?Pulse: 81   ?Resp: 16   ?Temp: 98.2 ?F (36.8 ?C)   ?SpO2: 96%   ?Weight: 220 lb (99.8 kg)   ?Height: 5' 5" (1.651 m)   ?PainSc:  7   ? ?Body mass index is 36.61 kg/m?. ? ? ?  10/01/2021  ?  2:41 PM 09/18/2021  ?  4:28 AM 12/12/2020  ?  1:59 PM 09/25/2020  ?  1:59 PM 08/18/2020  ? 10:53 AM 08/13/2020  ?  3:12 PM 02/08/2020  ? 11:39 PM  ?Advanced Directives  ?Does Patient Have a Medical Advance Directive? Yes Yes Yes Yes No No No  ?Type of [place]of [place] of [place] DNR (pink MOST or yellow form);Living will [place]Living will [place]South [place]Living will [place]burgLiving will     ?Does patient want to make changes to medical advance directive?   No - Patient declined      ?Copy of [place] Chart? No - copy requested   No - copy requested     ?Would patient like information on creating a medical advance directive?     No - Patient declined No - Patient declined   ? ? ?Current Medications (verified) ?Outpatient Encounter Medications as of 10/01/2021  ?Medication Sig  ? aspirin EC 81 MG tablet Take 81 mg by mouth daily.  ? atorvastatin (LIPITOR) 10 MG tablet TAKE 1 TABLET BY MOUTH EVERYDAY AT BEDTIME  ? Calcium Carbonate (CALCIUM 600 PO) Take 600 mg by mouth in the morning and at bedtime. (1600 & bedtime)  ? Cholecalciferol 50 MCG (2000 UT) CAPS Take 2,000 Units by mouth at bedtime.   ? diltiazem (CARDIZEM CD) 240 MG 24 hr capsule TAKE 1 CAPSULE BY MOUTH EVERY DAY  ? empagliflozin (JARDIANCE) 10 MG TABS tablet Take 1 tablet (10 mg total) by mouth daily before breakfast. Due for visit 07/2021  ?  EPINEPHrine (EPI-PEN) 0.3 mg/0.3 mL DEVI Inject 0.3 mg into the muscle See admin instructions. Inject 0.3 mg intramuscularly in the leg as needed for severe allergic reaction, call 911 and then inject again in the other leg. Reported on 06/26/2015  ? febuxostat (ULORIC) 40 MG tablet Take 40 mg by mouth every evening.   ? fluticasone (FLONASE) 50 MCG/ACT nasal spray Place 2 sprays into both nostrils in the morning.  ? fluticasone (FLOVENT HFA) 110 MCG/ACT inhaler Inhale 2 puffs into the lungs daily.  ? levalbuterol (XOPENEX HFA) 45 MCG/ACT inhaler Inhale 2 puffs into the lungs every 4 (four) hours as needed for shortness of breath.  ? levocetirizine (XYZAL) 5 MG tablet Take 5 mg by mouth daily at 4 PM.  ? losartan (COZAAR) 25 MG tablet TAKE 1 TABLET (25 MG TOTAL) BY MOUTH DAILY.  ? metFORMIN (GLUCOPHAGE) 850 MG tablet Take 1.5 tablets (1,275 mg total) by mouth 2 (two) times daily with a meal.  ? metoprolol succinate (TOPROL-XL) 50 MG 24 hr tablet TAKE 1 TABLET BY MOUTH DAILY WITH OR IMMEDIATELY FOLLOWING A MEAL  ? nortriptyline (PAMELOR) 25 MG capsule Take 25 mg by mouth daily at 4 PM.  ? omeprazole (PRILOSEC) 40  MG capsule TAKE 1 CAPSULE BY MOUTH EVERY DAY  ? pioglitazone (ACTOS) 30 MG tablet TAKE 1 TABLET BY MOUTH EVERY DAY  ? vitamin E 400 UNIT capsule Take 400 Units by mouth daily at 4 PM.  ? ?No facility-administered encounter medications on file as of 10/01/2021.  ? ? ?Allergies (verified) ?Iodine, Shellfish allergy, Sulfonamide derivatives, Cephalexin, Citrus, Lactose intolerance (gi), and Amoxicillin  ? ?History: ?Past Medical History:  ?Diagnosis Date  ? Allergy   ? Environmental, foods: shellfish, lactose intolerant, citrus, peanuts  ? Asthma   ? mild persistent  ? Atrophic vaginitis   ? BRCA gene mutation negative 2015  ? testing done at [facility] long  ? Diabetes mellitus without complication ([place])   ? Type 2  ? DJD (degenerative joint disease)   ? generalized joint pain, including back  ? Dysrhythmia   ? SVT   ? Family history of malignant neoplasm of breast   ? Fibromyalgia   ? Dr Amil Amen  ? GERD (gastroesophageal reflux disease)   ? Gout   ? Idiopathic, chronic  ? Hiatal hernia   ? History of kidney stones   ? Hyperlipidemia   ? Hypertension   ? Meniere's disease   ? R side worse, L side starting, see's Dr. Benjamine Mola.  ? Palpitations   ? PSVT on Holter monitoring  ? Shortness of breath   ? occ  ? Stroke [facility])   ? TIA  2014  ? SUI (stress urinary incontinence, female)   ? (resolved after surgery)  ? TIA (transient ischemic attack)   ? 07/2012, see Dr Leonie Man  ? Vertigo   ? ?Past Surgical History:  ?Procedure Laterality Date  ? ABDOMINAL HYSTERECTOMY  1994  ? TAH,BSO-BLADDER NECK SUSPENSION  ? BREAST SURGERY    ? LUMPECTOMY-BENIGN Breast Bx X2  ? CARPAL TUNNEL RELEASE Right 2005  ? CATARACT EXTRACTION, BILATERAL Bilateral 2016  ? Cataract   ? CHOLECYSTECTOMY, LAPAROSCOPIC  2001  ? [place]  ? EYE SURGERY    ? JOINT REPLACEMENT    ? bilatral knees;   ? KNEE ARTHROPLASTY  05/19/2012  ? Procedure: COMPUTER ASSISTED TOTAL KNEE ARTHROPLASTY;  Surgeon: Marybelle Killings, MD;  Location: [place];  Service: Orthopedics;  Laterality: Left;  Left  Total Knee Arthroplasty  ? KNEE ARTHROPLASTY Right 09/03/2013  ? Procedure: COMPUTER ASSISTED TOTAL KNEE ARTHROPLASTY;  Surgeon: Marybelle Killings, MD;  Location: [place];  Service: Orthopedics;  Laterality: Right;  Right Total Knee Arthroplasty, Computer Assist  ? KNEE ARTHROSCOPY    ? bil  ? LUMBAR LAMINECTOMY/DECOMPRESSION MICRODISCECTOMY N/A 08/18/2020  ? Procedure: L4-5 decompression, possible left L4-5 microdiscectomy;  Surgeon: Marybelle Killings, MD;  Location: [place];  Service: Orthopedics;  Laterality: N/A;  ? OOPHORECTOMY    ? BSO  ? SURGERY FOR POSERIOR TIBIAL TENDON  2003  ? TONSILLECTOMY    ? TRIPLE FUSION LEFT ANKLE Left 2007  ? TUBAL LIGATION    ? ?Family History  ?Problem Relation Age of Onset  ? Breast cancer Mother 53  ?     deceased 63  ? Cancer Father   ?     Brain tumor; path?;  deceased 21  ? Breast cancer Sister 61  ?     bilateral breast ca @ 54; BRCA2 positive  ? Hypertension Sister   ? Hemochromatosis Sister   ? Thalassemia Sister   ? Other Sister   ?     negative for BRCA1 BRCA2  ? Heart  failure Sister   ?     due to chemo?  ? CAD Son   ?     MI  ? Diabetes Son   ? Leukemia Son   ? Lymphoma Maternal Aunt   ?     deceased 62s  ? Breast cancer Cousin   ?     BRCA2 positive  ? Diabetes Other   ? Breast cancer Other   ?     distant maternal female relatives with breast cancer  ? Colon cancer Neg Hx   ? Colon polyps Neg Hx   ? Esophageal cancer Neg Hx   ? Rectal cancer Neg Hx   ? Stomach cancer Neg Hx   ? ?Social History  ? ?Socioeconomic History  ? Marital status: Married  ?  Spouse name: [name]  ? Number of children: 2  ? Years of education: 44  ? Highest education level: Not on file  ?Occupational History  ? Occupation: Hospital doctor-- retired   ?  Employer: RETIRED  ?Tobacco Use  ? Smoking status: Never  ? Smokeless tobacco: Never  ?Vaping Use  ? Vaping Use: Never used  ?Substance and Sexual Activity  ? Alcohol use: No  ?  Alcohol/week: 0.0 standard drinks  ? Drug use: No  ? Sexual activity: Not Currently  ?  Birth control/protection: Surgical  ?Other Topics Concern  ? Not on file  ?Social History Narrative  ? HSG  ? 1 year college for accounting. married '69. 2 sons - '70, '72: retired - [employer].    ? ACP - OK for CPR at least once, no long term ventilation, no heroic measures in the face of poor quality of life. HCPOA - husband, secondary son Kinzee Happel (c) [phone]  ? No Cafffeine (or very rare).   ?    ? ?Social Determinants of Health  ? ?Financial Resource Strain: Low Risk   ? Difficulty of Paying Living Expenses: Not hard at all  ?Food Insecurity: No Food Insecurity  ? Worried About Charity fundraiser in the Last Year: Never true  ? Ran Out of Food in the Last Year: Never true  ?Transportation Needs: No Transportation Needs  ? Lack of Transportation (Medical): No  ? Lack of  Transportation (Non-Medical): No  ?Physical Activity: Inactive  ? Days of Exercise per Week: 0 days  ? Minutes of Exercise per Session: 0 min  ?Stress: No Stress Concern Present  ? Feeling of Stress : Not at all  ?So

## 2021-10-05 ENCOUNTER — Telehealth: Payer: Self-pay | Admitting: Neurology

## 2021-10-05 NOTE — Telephone Encounter (Signed)
-----   Message from Star Age, MD sent at 10/01/2021  5:59 PM EDT ----- ?Patient referred by Dr. Larose Kells, seen by me on 09/15/21, diagnostic PSG on 09/21/21.   ? ?Please call and notify the patient that the recent sleep study showed obstructive sleep apnea and she had lower average oxygen saturations during sleep. I recommend she FU with her PCP about asthma management and weight loss. Her OSA is overall mild, but much more severe during dream/REM sleep and worth treating to see if she feels better after treatment. To that end I recommend treatment for this in the form of autoPAP, which means, that we don't have to bring her back for a second sleep study with CPAP, but will let him try an autoPAP machine at home, through a DME company (of her choice, or as per insurance requirement). The DME representative will educate her on how to use the machine, how to put the mask on, etc. I have placed an order in the chart. Please send referral, talk to patient, send report to referring MD. We will need a FU in sleep clinic for 10 weeks post-PAP set up, please arrange that with me or one of our NPs. Thanks,  ? ?Star Age, MD, PhD ?[facility]) ? ? ? ?

## 2021-10-05 NOTE — Telephone Encounter (Signed)
I called Kristy [name]. I advised Kristy [name] that Dr. Rexene Alberts reviewed their sleep study results and found that Kristy [name] has mild sleep apnea. Dr. Rexene Alberts recommends that Kristy [name] starts auto CPAP. I reviewed PAP compliance expectations with the Kristy [name]. Kristy [name] is agreeable to starting a CPAP. I advised Kristy [name] that an order will be sent to a DME, [facility], and [facility] will call the Kristy [name] within about one week after they file with the Kristy [name]'s insurance. [facility] will show the Kristy [name] how to use the machine, fit for masks, and troubleshoot the CPAP if needed. A follow up appt was made for insurance purposes with Dr. Rexene Alberts on 12/16/2021 at 1:15 pm. Kristy [name] verbalized understanding to arrive 15 minutes early and bring their CPAP. A letter with all of this information in it will be mailed to the Kristy [name] as a reminder. I verified with the Kristy [name] that the address we have on file is correct. Kristy [name] verbalized understanding of results. Kristy [name] had no questions at this time but was encouraged to call back if questions arise. I have sent the order to [facility] and have received confirmation that they have received the order. ? ?

## 2021-10-06 ENCOUNTER — Encounter: Payer: Self-pay | Admitting: Orthopaedic Surgery

## 2021-10-06 ENCOUNTER — Ambulatory Visit (INDEPENDENT_AMBULATORY_CARE_PROVIDER_SITE_OTHER): Payer: Medicare Other | Admitting: Orthopaedic Surgery

## 2021-10-06 VITALS — BP 100/66 | HR 93 | Ht 65.0 in | Wt 220.0 lb

## 2021-10-06 DIAGNOSIS — M7042 Prepatellar bursitis, left knee: Secondary | ICD-10-CM

## 2021-10-06 NOTE — Progress Notes (Signed)
? ?Office Visit Note ?  ?Patient: Kristy [name]           ?Date of Birth: [birth year]           ?MRN: [id number] ?Visit Date: 10/06/2021 ?             ?Requested by: Colon Branch, MD ?[address] ?[address] ?[place],  [place] [address] ?PCP: Colon Branch, MD ? ? ?Assessment & Plan: ?Visit Diagnoses:  ?1. Prepatellar bursitis, left knee   ? ? ?Plan: Recheck 3 weeks.  We will consider possibly aspirating the prepatellar bursa at that time. ? ?Follow-Up Instructions: Return in about 3 weeks (around 10/27/2021).  ? ?Orders:  ?No orders of the defined types were placed in this encounter. ? ?No orders of the defined types were placed in this encounter. ? ? ? ? Procedures: ?No procedures performed ? ? ?Clinical Data: ?No additional findings. ? ? ?Subjective: ?Chief Complaint  ?Patient presents with  ? Left Knee - Follow-up  ?  Fall 09/18/2021  ? ? ?HPI follow-up visit for left knee prepatellar bursal hematoma after a fall.  Still has bruising fall date was 09/18/2021.  No enlargement in size.  No drainage.  Previous aspiration was unsuccessful due to clots.  She can gradually wean herself of the knee immobilizer avoid falling and I will recheck her in 3 weeks.  We discussed that if the fluid persists at some point we should be able to aspirate the fluid once it liquefies. ? ?Review of Systems unchanged ? ? ?Objective: ?Vital Signs: BP 100/66 (BP Location: Left Arm, Patient Position: Sitting, Cuff Size: Large)   Pulse 93   Ht _0  (1.651 m)   Wt 220 lb (99.8 kg)   BMI 36.61 kg/m?  ? ?Physical Exam ?Constitutional:   ?   Appearance: She is well-developed.  ?HENT:  ?   Head: Normocephalic.  ?   Right Ear: External ear normal.  ?   Left Ear: External ear normal. There is no impacted cerumen.  ?Eyes:  ?   Pupils: Pupils are equal, round, and reactive to light.  ?Neck:  ?   Thyroid: No thyromegaly.  ?   Trachea: No tracheal deviation.  ?Cardiovascular:  ?   Rate and Rhythm: Normal rate.  ?Pulmonary:  ?   Effort: Pulmonary effort  is normal.  ?Abdominal:  ?   Palpations: Abdomen is soft.  ?Musculoskeletal:  ?   Cervical back: No rigidity.  ?Skin: ?   General: Skin is warm and dry.  ?Neurological:  ?   Mental Status: She is alert and oriented to person, place, and time.  ?Psychiatric:     ?   Behavior: Behavior normal.  ? ? ?Ortho Exam slightly larger in diameter than ball prepatellar bursa left knee with darkening of the skin no drainage.  She is able to flex to about 70 degrees comfortably before the knee starts getting tight. ? ?Specialty Comments:  ?No specialty comments available. ? ?Imaging: ?No results found. ? ? ?PMFS History: ?Patient Active Problem List  ? Diagnosis Date Noted  ? Prepatellar bursitis, left knee 09/29/2021  ? Status post lumbar spine surgery for decompression of spinal cord 08/26/2020  ? Lumbar disc herniation 05/28/2020  ? Spinal stenosis of lumbar region 05/28/2020  ? H/O vitamin D deficiency 09/21/2018  ? Elevated LFTs 12/25/2015  ? Dizzy 07/31/2015  ? PCP  notes >>>>>>>>>>>>>>>> 02/11/2015  ? Hyperparathyroidism ([place]) 06/07/2014  ? Benign paroxysmal positional vertigo 02/19/2014  ?  FHx: BRCA2 gene positive   ? Family history of malignant neoplasm of breast   ? Family history of breast cancer 01/10/2014  ? Tachycardia 11/19/2013  ? Elevated LDL cholesterol level 06/11/2013  ? Obesity, Class I, BMI 30.0-34.9 (see actual BMI) 06/11/2013  ? Osteopenia 06/06/2013  ? Menopause 01/09/2013  ? History of TIA (transient ischemic attack) 07/15/2012  ? Annual physical exam 05/06/2011  ? SUI (stress urinary incontinence, female)   ? Atrophic vaginitis   ? GOUT, UNSPECIFIED 04/28/2010  ? Diabetes ([place]) 04/28/2010  ? Meniere's disease 07/30/2008  ? Hearing loss 07/30/2008  ? Essential hypertension 08/19/2007  ? ALLERGIC RHINITIS 08/19/2007  ? ASTHMA 08/19/2007  ? ACID REFLUX DISEASE 08/19/2007  ? IRRITABLE BOWEL SYNDROME 08/19/2007  ? [place] DISEASE 08/19/2007  ? FIBROMYALGIA 08/19/2007  ? FATIGUE, CHRONIC 08/19/2007   ? ?Past Medical History:  ?Diagnosis Date  ? Allergy   ? Environmental, foods: shellfish, lactose intolerant, citrus, peanuts  ? Asthma   ? mild persistent  ? Atrophic vaginitis   ? BRCA gene mutation negative 2015  ? testing done at [facility] long  ? Diabetes mellitus without complication ([place])   ? Type 2  ? DJD (degenerative joint disease)   ? generalized joint pain, including back  ? Dysrhythmia   ? SVT  ? Family history of malignant neoplasm of breast   ? Fibromyalgia   ? Dr Amil Amen  ? GERD (gastroesophageal reflux disease)   ? Gout   ? Idiopathic, chronic  ? Hiatal hernia   ? History of kidney stones   ? Hyperlipidemia   ? Hypertension   ? Meniere's disease   ? R side worse, L side starting, see's Dr. Benjamine Mola.  ? Palpitations   ? PSVT on Holter monitoring  ? Shortness of breath   ? occ  ? Stroke [facility])   ? TIA  2014  ? SUI (stress urinary incontinence, female)   ? (resolved after surgery)  ? TIA (transient ischemic attack)   ? 07/2012, see Dr Leonie Man  ? Vertigo   ?  ?Family History  ?Problem Relation Age of Onset  ? Breast cancer Mother 37  ?     deceased 56  ? Cancer Father   ?     Brain tumor; path?; deceased 47  ? Breast cancer Sister 46  ?     bilateral breast ca @ 26; BRCA2 positive  ? Hypertension Sister   ? Hemochromatosis Sister   ? Thalassemia Sister   ? Other Sister   ?     negative for BRCA1 BRCA2  ? Heart failure Sister   ?     due to chemo?  ? CAD Son   ?     MI  ? Diabetes Son   ? Leukemia Son   ? Lymphoma Maternal Aunt   ?     deceased 62s  ? Breast cancer Cousin   ?     BRCA2 positive  ? Diabetes Other   ? Breast cancer Other   ?     distant maternal female relatives with breast cancer  ? Colon cancer Neg Hx   ? Colon polyps Neg Hx   ? Esophageal cancer Neg Hx   ? Rectal cancer Neg Hx   ? Stomach cancer Neg Hx   ?  ?Past Surgical History:  ?Procedure Laterality Date  ? ABDOMINAL HYSTERECTOMY  1994  ? TAH,BSO-BLADDER NECK SUSPENSION  ? BREAST SURGERY    ? LUMPECTOMY-BENIGN Breast Bx X2  ?  CARPAL TUNNEL  RELEASE Right 2005  ? CATARACT EXTRACTION, BILATERAL Bilateral 2016  ? Cataract   ? CHOLECYSTECTOMY, LAPAROSCOPIC  2001  ? [place]  ? EYE SURGERY    ? JOINT REPLACEMENT    ? bilatral knees;   ? KNEE ARTHROPLASTY  05/19/2012  ? Procedure: COMPUTER ASSISTED TOTAL KNEE ARTHROPLASTY;  Surgeon: Marybelle Killings, MD;  Location: [place];  Service: Orthopedics;  Laterality: Left;  Left  Total Knee Arthroplasty  ? KNEE ARTHROPLASTY Right 09/03/2013  ? Procedure: COMPUTER ASSISTED TOTAL KNEE ARTHROPLASTY;  Surgeon: Marybelle Killings, MD;  Location: [place];  Service: Orthopedics;  Laterality: Right;  Right Total Knee Arthroplasty, Computer Assist  ? KNEE ARTHROSCOPY    ? bil  ? LUMBAR LAMINECTOMY/DECOMPRESSION MICRODISCECTOMY N/A 08/18/2020  ? Procedure: L4-5 decompression, possible left L4-5 microdiscectomy;  Surgeon: Marybelle Killings, MD;  Location: [place];  Service: Orthopedics;  Laterality: N/A;  ? OOPHORECTOMY    ? BSO  ? SURGERY FOR POSERIOR TIBIAL TENDON  2003  ? TONSILLECTOMY    ? TRIPLE FUSION LEFT ANKLE Left 2007  ? TUBAL LIGATION    ? ?Social History  ? ?Occupational History  ? Occupation: Hospital doctor-- retired   ?  Employer: RETIRED  ?Tobacco Use  ? Smoking status: Never  ? Smokeless tobacco: Never  ?Vaping Use  ? Vaping Use: Never used  ?Substance and Sexual Activity  ? Alcohol use: No  ?  Alcohol/week: 0.0 standard drinks  ? Drug use: No  ? Sexual activity: Not Currently  ?  Birth control/protection: Surgical  ? ? ? ? ? ? ?

## 2021-10-07 ENCOUNTER — Ambulatory Visit: Payer: Medicare Other | Attending: Obstetrics & Gynecology

## 2021-10-07 DIAGNOSIS — R32 Unspecified urinary incontinence: Secondary | ICD-10-CM | POA: Insufficient documentation

## 2021-10-07 DIAGNOSIS — M6281 Muscle weakness (generalized): Secondary | ICD-10-CM | POA: Insufficient documentation

## 2021-10-07 DIAGNOSIS — R279 Unspecified lack of coordination: Secondary | ICD-10-CM | POA: Diagnosis not present

## 2021-10-07 NOTE — Therapy (Signed)
?OUTPATIENT PHYSICAL THERAPY FEMALE PELVIC EVALUATION ? ? ?Patient Name: Kristy [name] ?MRN: [id number] ?DOB:1945-08-13, 76 y.o., female ?Today's Date: 10/07/2021 ? ? PT End of Session - 10/07/21 1332   ? ? Visit Number 1   ? Date for PT Re-Evaluation 12/16/21   ? Authorization Type Medicare   ? Progress Note Due on Visit 10   ? PT Start Time 1145   ? PT Stop Time 1225   ? PT Time Calculation (min) 40 min   ? Activity Tolerance Patient tolerated treatment well   ? Behavior During Therapy [facility] for tasks assessed/performed   ? ?  ?  ? ?  ? ? ?Past Medical History:  ?Diagnosis Date  ? Allergy   ? Environmental, foods: shellfish, lactose intolerant, citrus, peanuts  ? Asthma   ? mild persistent  ? Atrophic vaginitis   ? BRCA gene mutation negative 2015  ? testing done at [facility] long  ? Diabetes mellitus without complication ([place])   ? Type 2  ? DJD (degenerative joint disease)   ? generalized joint pain, including back  ? Dysrhythmia   ? SVT  ? Family history of malignant neoplasm of breast   ? Fibromyalgia   ? Dr Amil Amen  ? GERD (gastroesophageal reflux disease)   ? Gout   ? Idiopathic, chronic  ? Hiatal hernia   ? History of kidney stones   ? Hyperlipidemia   ? Hypertension   ? Meniere's disease   ? R side worse, L side starting, see's Dr. Benjamine Mola.  ? Palpitations   ? PSVT on Holter monitoring  ? Shortness of breath   ? occ  ? Stroke [facility])   ? TIA  2014  ? SUI (stress urinary incontinence, female)   ? (resolved after surgery)  ? TIA (transient ischemic attack)   ? 07/2012, see Dr Leonie Man  ? Vertigo   ? ?Past Surgical History:  ?Procedure Laterality Date  ? ABDOMINAL HYSTERECTOMY  1994  ? TAH,BSO-BLADDER NECK SUSPENSION  ? BREAST SURGERY    ? LUMPECTOMY-BENIGN Breast Bx X2  ? CARPAL TUNNEL RELEASE Right 2005  ? CATARACT EXTRACTION, BILATERAL Bilateral 2016  ? Cataract   ? CHOLECYSTECTOMY, LAPAROSCOPIC  2001  ? [place]  ? EYE SURGERY    ? JOINT REPLACEMENT    ? bilatral knees;   ? KNEE ARTHROPLASTY  05/19/2012  ?  Procedure: COMPUTER ASSISTED TOTAL KNEE ARTHROPLASTY;  Surgeon: Marybelle Killings, MD;  Location: [place];  Service: Orthopedics;  Laterality: Left;  Left  Total Knee Arthroplasty  ? KNEE ARTHROPLASTY Right 09/03/2013  ? Procedure: COMPUTER ASSISTED TOTAL KNEE ARTHROPLASTY;  Surgeon: Marybelle Killings, MD;  Location: [place];  Service: Orthopedics;  Laterality: Right;  Right Total Knee Arthroplasty, Computer Assist  ? KNEE ARTHROSCOPY    ? bil  ? LUMBAR LAMINECTOMY/DECOMPRESSION MICRODISCECTOMY N/A 08/18/2020  ? Procedure: L4-5 decompression, possible left L4-5 microdiscectomy;  Surgeon: Marybelle Killings, MD;  Location: [place];  Service: Orthopedics;  Laterality: N/A;  ? OOPHORECTOMY    ? BSO  ? SURGERY FOR POSERIOR TIBIAL TENDON  2003  ? TONSILLECTOMY    ? TRIPLE FUSION LEFT ANKLE Left 2007  ? TUBAL LIGATION    ? ?Patient Active Problem List  ? Diagnosis Date Noted  ? Prepatellar bursitis, left knee 09/29/2021  ? Status post lumbar spine surgery for decompression of spinal cord 08/26/2020  ? Lumbar disc herniation 05/28/2020  ? Spinal stenosis of lumbar region 05/28/2020  ? H/O  vitamin D deficiency 09/21/2018  ? Elevated LFTs 12/25/2015  ? Dizzy 07/31/2015  ? PCP  notes >>>>>>>>>>>>>>>> 02/11/2015  ? Hyperparathyroidism ([place]) 06/07/2014  ? Benign paroxysmal positional vertigo 02/19/2014  ? FHx: BRCA2 gene positive   ? Family history of malignant neoplasm of breast   ? Family history of breast cancer 01/10/2014  ? Tachycardia 11/19/2013  ? Elevated LDL cholesterol level 06/11/2013  ? Obesity, Class I, BMI 30.0-34.9 (see actual BMI) 06/11/2013  ? Osteopenia 06/06/2013  ? Menopause 01/09/2013  ? History of TIA (transient ischemic attack) 07/15/2012  ? Annual physical exam 05/06/2011  ? SUI (stress urinary incontinence, female)   ? Atrophic vaginitis   ? GOUT, UNSPECIFIED 04/28/2010  ? Diabetes ([place]) 04/28/2010  ? Meniere's disease 07/30/2008  ? Hearing loss 07/30/2008  ? Essential hypertension 08/19/2007  ? ALLERGIC RHINITIS 08/19/2007  ?  ASTHMA 08/19/2007  ? ACID REFLUX DISEASE 08/19/2007  ? IRRITABLE BOWEL SYNDROME 08/19/2007  ? [place] DISEASE 08/19/2007  ? FIBROMYALGIA 08/19/2007  ? FATIGUE, CHRONIC 08/19/2007  ? ? ?PCP: Colon Branch, MD ? ?REFERRING PROVIDER: Lavonia Drafts, MD ? ?REFERRING DIAG: R32 (ICD-10-CM) - Incontinence of urine in female ? ?THERAPY DIAG:  ?Muscle weakness (generalized) ? ?Unspecified lack of coordination ? ?ONSET DATE: 10/07/21 ? ?SUBJECTIVE:                                                                                                                                                                                          ? ?SUBJECTIVE STATEMENT: ?Pt states that she has severe urgency. She has no issues with stress incontinence. Once it starts, she fully voids. She has been wearing Briefs for several years, but she feels like issue got much worse about a year ago. Now not on any diuretic and still having issue. ?Fluid intake: Yes: no caffeine, 2-3 16 oz bottles of water a day   ? ?Patient confirms identification and approves PT to assess pelvic floor and treatment Yes ? ? ?PAIN:  ?Are you having pain? No ? ? ? ?PRECAUTIONS: None ? ?WEIGHT BEARING RESTRICTIONS No ? ?FALLS:  ?Has patient fallen in last 6 months? Yes. Number of falls daily, most not severe ? ?LIVING ENVIRONMENT: ?Lives with: lives with their family ?Lives in: House/apartment ? ? 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 ?PLOF: Independent ? ?PATIENT GOALS decrease urge incontinence ? ?PERTINENT HISTORY:  ?Bladder tack/hysterectomy 1990, cholecystectomy; diabetitc ?Sexual abuse: No ? ?BOWEL MOVEMENT ?Pain with bowel movement: No ?Type of bowel movement:Frequency up to a week when she is having constipation, but alternates due to IBS and Strain Yes - not currently, but history of straining ?Fully empty  rectum: Yes: - ?Leakage: Yes: when she is having diarrhea, she can't quite make it ?Pads: Yes: briefs ?Fiber supplement: Yes: takes when she is  constipated ? ?URINATION ?Pain with urination: No ?Fully empty bladder: Yes: sometimes she has to sit there for a few minutes- she does have post-void dribbling without sensation ?Stream:  WNL ?Urgency: Yes: sometimes no ability to wait ?Frequency: Wakes 3x/night; during the day varies between 30 minutes to several hours ?Leakage: Urge to void ?Pads: Yes: wearing briefs - 3/day, doesn't usually wear one at night ? ?INTERCOURSE ?Pain with intercourse:  not sexually active, no pain in the past ?Using coconut oil daily for vaginal moisture ? ?PREGNANCY ?Vaginal deliveries 2 ?Tearing Yes: one tear to rectum ?C-section deliveries 0 ?Currently pregnant No ? ?OBJECTIVE 10/07/21:  ? ?COGNITION: ? Overall cognitive status: Within functional limits for tasks assessed   ?  ?SENSATION: ? Light touch: Appears intact ? Proprioception: Appears intact ? ? ?GAIT: ?Comments: Currently ambulating with rolling walking due to recent fall and injury to Lt knee pain - waiting to see if she needs surgery - does not typically ambulate with RW ? ?POSTURE:  ?Posterior pelvic tilt ? ? ? ?PELVIC MMT: ?1-2/5 strength ?3 second endurance ?4 repetitions with decreasing coordination  ? ? ?      PALPATION: ?  General  mild increase in urgency when palpating over bladder ? ?              External Perineal Exam changes consistent with decreased estrogen ?              ?              Internal Pelvic Floor Bil urethral tenderness and restriction reported as sharpness/stinging by patient ? ?TONE: ?WNL ? ?PROLAPSE: ?WNL ? ?TODAY'S TREATMENT 10/07/21 ?EVAL  ?Manual: ?Soft tissue mobilization: ?Scar tissue mobilization: ?Myofascial release: ?Spinal mobilization: ?Internal pelvic floor techniques: ?Dry needling: ?Neuromuscular re-education: ?Core retraining:  ?Core facilitation: ?Form correction: ?Pelvic floor contraction training: ?Quick flicks with breath coordination and multi-modal cues to help improve motor control and strength, 10x ?Down  training: ?Exercises: ?Stretches/mobility: ?Strengthening: ?Therapeutic activities: ?Functional strengthening activities: ?Self-care: ?Urge suppression technique ? ? ? ?PATIENT EDUCATION:  ?Education details: Pelvic

## 2021-10-07 NOTE — Patient Instructions (Signed)
Urge suppression technique: ?A technique to help you hold urine until it?s an appropriate time to go, ?whether this is making it home or trying to reach a specific voiding time ?frame according to your schedule. ?It helps to send signals from the bladder to the brain that say you don?t ?actually have to void urine right now. ?This most likely only give you several minutes of relief at first, but repeat as ?needed; the benefit will last longer as you use this technique more and get ?into better bladder habits. ??  ?The technique: ?o Perform 5 quick flicks (Kegels) rapidly, not worrying about fully ?relaxing in between each (only in this technique). ?o Then perform several deep belly breaths while focusing on relaxing ?the pelvic floor. ?o Go do something else to help distract yourself from the urge to ?urinate. ?o Repeat as needed. ? ?

## 2021-10-27 DIAGNOSIS — M65321 Trigger finger, right index finger: Secondary | ICD-10-CM | POA: Diagnosis not present

## 2021-10-27 DIAGNOSIS — M1991 Primary osteoarthritis, unspecified site: Secondary | ICD-10-CM | POA: Diagnosis not present

## 2021-10-27 DIAGNOSIS — R945 Abnormal results of liver function studies: Secondary | ICD-10-CM | POA: Diagnosis not present

## 2021-10-27 DIAGNOSIS — M797 Fibromyalgia: Secondary | ICD-10-CM | POA: Diagnosis not present

## 2021-10-27 DIAGNOSIS — M5136 Other intervertebral disc degeneration, lumbar region: Secondary | ICD-10-CM | POA: Diagnosis not present

## 2021-10-27 DIAGNOSIS — E669 Obesity, unspecified: Secondary | ICD-10-CM | POA: Diagnosis not present

## 2021-10-27 DIAGNOSIS — M1A09X0 Idiopathic chronic gout, multiple sites, without tophus (tophi): Secondary | ICD-10-CM | POA: Diagnosis not present

## 2021-10-27 DIAGNOSIS — Z6835 Body mass index (BMI) 35.0-35.9, adult: Secondary | ICD-10-CM | POA: Diagnosis not present

## 2021-10-28 ENCOUNTER — Ambulatory Visit (INDEPENDENT_AMBULATORY_CARE_PROVIDER_SITE_OTHER): Payer: Medicare Other | Admitting: Orthopaedic Surgery

## 2021-10-28 VITALS — BP 104/61 | HR 96

## 2021-10-28 DIAGNOSIS — M7042 Prepatellar bursitis, left knee: Secondary | ICD-10-CM

## 2021-10-28 NOTE — Progress Notes (Signed)
Office Visit Note   Patient: Kristy [name]           Date of Birth: 1945/08/12           MRN: [id number] Visit Date: 10/28/2021              Requested by: Colon Branch, MD [address] [place],  [place] [address] PCP: Colon Branch, MD   Assessment & Plan: Visit Diagnoses:  1. Prepatellar bursitis, left knee     Plan: Patient continues to resorb the prepatellar bursal hematoma that occurred from the fall on her knee.  I will recheck her in 6 weeks.  Follow-Up Instructions: Return in about 6 weeks (around 12/09/2021).   Orders:  No orders of the defined types were placed in this encounter.  No orders of the defined types were placed in this encounter.     Procedures: No procedures performed   Clinical Data: No additional findings.   Subjective: Chief Complaint  Patient presents with   Left Knee - Follow-up    DOI 09/18/21- fall     HPI 76 year old female returns post fall 09/18/2021.  She has chronic problems with balance and has been diagnosed with Mnire's disease.  Prepatellar bursa has decreased she is using a walker at home with a cane when she goes out.  Swelling is gone down some.  Review of Systems updated unchanged.   Objective: Vital Signs: BP 104/61   Pulse 96   Physical Exam Constitutional:      Appearance: She is well-developed.  HENT:     Head: Normocephalic.     Right Ear: External ear normal.     Left Ear: External ear normal. There is no impacted cerumen.  Eyes:     Pupils: Pupils are equal, round, and reactive to light.  Neck:     Thyroid: No thyromegaly.     Trachea: No tracheal deviation.  Cardiovascular:     Rate and Rhythm: Normal rate.  Pulmonary:     Effort: Pulmonary effort is normal.  Abdominal:     Palpations: Abdomen is soft.  Musculoskeletal:     Cervical back: No rigidity.  Skin:    General: Skin is warm and dry.  Neurological:     Mental Status: She is alert and oriented to person, place, and time.   Psychiatric:        Behavior: Behavior normal.    Ortho Exam persistent discoloration over the prepatellar bursa and patellar tendon.  Swelling is decreased from last visit.  Still ballotable but less subcutaneous fluid is present.  She has full extension and is flexing to 100 degrees easily.  Specialty Comments:  No specialty comments available.  Imaging: No results found.   PMFS History: Patient Active Problem List   Diagnosis Date Noted   Prepatellar bursitis, left knee 09/29/2021   Status post lumbar spine surgery for decompression of spinal cord 08/26/2020   Lumbar disc herniation 05/28/2020   Spinal stenosis of lumbar region 05/28/2020   H/O vitamin D deficiency 09/21/2018   Elevated LFTs 12/25/2015   Dizzy 07/31/2015   PCP  notes >>>>>>>>>>>>>>>> 02/11/2015   Hyperparathyroidism ([place]) 06/07/2014   Benign paroxysmal positional vertigo 02/19/2014   FHx: BRCA2 gene positive    Family history of malignant neoplasm of breast    Family history of breast cancer 01/10/2014   Tachycardia 11/19/2013   Elevated LDL cholesterol level 06/11/2013   Obesity, Class I, BMI 30.0-34.9 (see actual BMI)  06/11/2013   Osteopenia 06/06/2013   Menopause 01/09/2013   History of TIA (transient ischemic attack) 07/15/2012   Annual physical exam 05/06/2011   SUI (stress urinary incontinence, female)    Atrophic vaginitis    GOUT, UNSPECIFIED 04/28/2010   Diabetes ([place]) 04/28/2010   Meniere's disease 07/30/2008   Hearing loss 07/30/2008   Essential hypertension 08/19/2007   ALLERGIC RHINITIS 08/19/2007   ASTHMA 08/19/2007   ACID REFLUX DISEASE 08/19/2007   IRRITABLE BOWEL SYNDROME 08/19/2007   DEGENERATIVE [place] DISEASE 08/19/2007   FIBROMYALGIA 08/19/2007   FATIGUE, CHRONIC 08/19/2007   Past Medical History:  Diagnosis Date   Allergy    Environmental, foods: shellfish, lactose intolerant, citrus, peanuts   Asthma    mild persistent   Atrophic vaginitis    BRCA gene mutation  negative 2015   testing done at [facility]   Diabetes mellitus without complication ([facility])    Type 2   DJD (degenerative joint disease)    generalized joint pain, including back   Dysrhythmia    SVT   Family history of malignant neoplasm of breast    Fibromyalgia    Dr Amil Amen   GERD (gastroesophageal reflux disease)    Gout    Idiopathic, chronic   Hiatal hernia    History of kidney stones    Hyperlipidemia    Hypertension    Meniere's disease    R side worse, L side starting, see's Dr. Benjamine Mola.   Palpitations    PSVT on Holter monitoring   Shortness of breath    occ   Stroke [facility])    TIA  2014   SUI (stress urinary incontinence, female)    (resolved after surgery)   TIA (transient ischemic attack)    07/2012, see Dr Leonie Man   Vertigo     Family History  Problem Relation Age of Onset   Breast cancer Mother 58       deceased 70   Cancer Father        Brain tumor; path?; deceased 30   Breast cancer Sister 73       bilateral breast ca @ 71; BRCA2 positive   Hypertension Sister    Hemochromatosis Sister    Thalassemia Sister    Other Sister        negative for BRCA1 BRCA2   Heart failure Sister        due to chemo?   CAD Son        MI   Diabetes Son    Leukemia Son    Lymphoma Maternal Aunt        deceased 52s   Breast cancer Cousin        BRCA2 positive   Diabetes Other    Breast cancer Other        distant maternal female relatives with breast cancer   Colon cancer Neg Hx    Colon polyps Neg Hx    Esophageal cancer Neg Hx    Rectal cancer Neg Hx    Stomach cancer Neg Hx     Past Surgical History:  Procedure Laterality Date   ABDOMINAL HYSTERECTOMY  1994   TAH,BSO-BLADDER NECK SUSPENSION   BREAST SURGERY     LUMPECTOMY-BENIGN Breast Bx X2   CARPAL TUNNEL RELEASE Right 2005   CATARACT EXTRACTION, BILATERAL Bilateral 2016   Cataract    CHOLECYSTECTOMY, LAPAROSCOPIC  2001   [place]   EYE SURGERY     JOINT REPLACEMENT     bilatral  knees;     KNEE ARTHROPLASTY  05/19/2012   Procedure: COMPUTER ASSISTED TOTAL KNEE ARTHROPLASTY;  Surgeon: Marybelle Killings, MD;  Location: [place];  Service: Orthopedics;  Laterality: Left;  Left  Total Knee Arthroplasty   KNEE ARTHROPLASTY Right 09/03/2013   Procedure: COMPUTER ASSISTED TOTAL KNEE ARTHROPLASTY;  Surgeon: Marybelle Killings, MD;  Location: [place];  Service: Orthopedics;  Laterality: Right;  Right Total Knee Arthroplasty, Computer Assist   KNEE ARTHROSCOPY     bil   LUMBAR LAMINECTOMY/DECOMPRESSION MICRODISCECTOMY N/A 08/18/2020   Procedure: L4-5 decompression, possible left L4-5 microdiscectomy;  Surgeon: Marybelle Killings, MD;  Location: [place];  Service: Orthopedics;  Laterality: N/A;   OOPHORECTOMY     BSO   SURGERY FOR POSERIOR TIBIAL TENDON  2003   TONSILLECTOMY     TRIPLE FUSION LEFT ANKLE Left 2007   TUBAL LIGATION     Social History   Occupational History   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

## 2021-11-04 ENCOUNTER — Ambulatory Visit: Payer: Medicare Other | Attending: Obstetrics & Gynecology

## 2021-11-04 ENCOUNTER — Other Ambulatory Visit: Payer: Self-pay | Admitting: Internal Medicine

## 2021-11-04 DIAGNOSIS — M6281 Muscle weakness (generalized): Secondary | ICD-10-CM | POA: Diagnosis not present

## 2021-11-04 DIAGNOSIS — R279 Unspecified lack of coordination: Secondary | ICD-10-CM | POA: Diagnosis not present

## 2021-11-04 NOTE — Patient Instructions (Signed)
Bladder/bowel retraining:  Drink 4-8oz an hour ONLY WATER Stop water intake 3 hours before bed Try to go at least 2-3 hours between trips to the bathroom - go whether you need to or not.  For two weeks.   [facility] [address], [place], [place] [address] Phone # [phone] Fax [phone]

## 2021-11-04 NOTE — Therapy (Signed)
OUTPATIENT PHYSICAL THERAPY TREATMENT NOTE   Patient Name: Kristy [name] MRN: [id number] DOB:[birth year], 76 y.o., female Today's Date: 11/04/2021  PCP: Colon Branch, MD REFERRING PROVIDER: Lavonia Drafts, MD  END OF SESSION:   PT End of Session - 11/04/21 1617     Visit Number 2    Date for PT Re-Evaluation 12/16/21    Authorization Type Medicare    Progress Note Due on Visit 10    PT Start Time 1615    PT Stop Time 1655    PT Time Calculation (min) 40 min    Activity Tolerance Patient tolerated treatment well    Behavior During Therapy WFL for tasks assessed/performed             Past Medical History:  Diagnosis Date   Allergy    Environmental, foods: shellfish, lactose intolerant, citrus, peanuts   Asthma    mild persistent   Atrophic vaginitis    BRCA gene mutation negative 2015   testing done at [place]   Diabetes mellitus without complication (HCC)    Type 2   DJD (degenerative joint disease)    generalized joint pain, including back   Dysrhythmia    SVT   Family history of malignant neoplasm of breast    Fibromyalgia    Dr Amil Amen   GERD (gastroesophageal reflux disease)    Gout    Idiopathic, chronic   Hiatal hernia    History of kidney stones    Hyperlipidemia    Hypertension    Meniere's disease    R side worse, L side starting, see's Dr. Benjamine Mola.   Palpitations    PSVT on Holter monitoring   Shortness of breath    occ   Stroke [facility])    TIA  2014   SUI (stress urinary incontinence, female)    (resolved after surgery)   TIA (transient ischemic attack)    07/2012, see Dr Leonie Man   Vertigo    Past Surgical History:  Procedure Laterality Date   ABDOMINAL HYSTERECTOMY  1994   TAH,BSO-BLADDER NECK SUSPENSION   BREAST SURGERY     LUMPECTOMY-BENIGN Breast Bx X2   CARPAL TUNNEL RELEASE Right 2005   CATARACT EXTRACTION, BILATERAL Bilateral 2016   Cataract    CHOLECYSTECTOMY, LAPAROSCOPIC  2001   CYSTOCELE REPAIR  1994   EYE SURGERY      JOINT REPLACEMENT     bilatral knees;    KNEE ARTHROPLASTY  05/19/2012   Procedure: COMPUTER ASSISTED TOTAL KNEE ARTHROPLASTY;  Surgeon: Marybelle Killings, MD;  Location: [place];  Service: Orthopedics;  Laterality: Left;  Left  Total Knee Arthroplasty   KNEE ARTHROPLASTY Right 09/03/2013   Procedure: COMPUTER ASSISTED TOTAL KNEE ARTHROPLASTY;  Surgeon: Marybelle Killings, MD;  Location: [place];  Service: Orthopedics;  Laterality: Right;  Right Total Knee Arthroplasty, Computer Assist   KNEE ARTHROSCOPY     bil   LUMBAR LAMINECTOMY/DECOMPRESSION MICRODISCECTOMY N/A 08/18/2020   Procedure: L4-5 decompression, possible left L4-5 microdiscectomy;  Surgeon: Marybelle Killings, MD;  Location: [place];  Service: Orthopedics;  Laterality: N/A;   OOPHORECTOMY     BSO   SURGERY FOR POSERIOR TIBIAL TENDON  2003   TONSILLECTOMY     TRIPLE FUSION LEFT ANKLE Left 2007   TUBAL LIGATION     Patient Active Problem List   Diagnosis Date Noted   Prepatellar bursitis, left knee 09/29/2021   Status post lumbar spine surgery for decompression of spinal cord 08/26/2020   Lumbar  disc herniation 05/28/2020   Spinal stenosis of lumbar region 05/28/2020   H/O vitamin D deficiency 09/21/2018   Elevated LFTs 12/25/2015   Dizzy 07/31/2015   PCP  notes >>>>>>>>>>>>>>>> 02/11/2015   Hyperparathyroidism ([place]) 06/07/2014   Benign paroxysmal positional vertigo 02/19/2014   FHx: BRCA2 gene positive    Family history of malignant neoplasm of breast    Family history of breast cancer 01/10/2014   Tachycardia 11/19/2013   Elevated LDL cholesterol level 06/11/2013   Obesity, Class I, BMI 30.0-34.9 (see actual BMI) 06/11/2013   Osteopenia 06/06/2013   Menopause 01/09/2013   History of TIA (transient ischemic attack) 07/15/2012   Annual physical exam 05/06/2011   SUI (stress urinary incontinence, female)    Atrophic vaginitis    GOUT, UNSPECIFIED 04/28/2010   Diabetes ([place]) 04/28/2010   Meniere's disease 07/30/2008   Hearing loss  07/30/2008   Essential hypertension 08/19/2007   ALLERGIC RHINITIS 08/19/2007   ASTHMA 08/19/2007   ACID REFLUX DISEASE 08/19/2007   IRRITABLE BOWEL SYNDROME 08/19/2007   DEGENERATIVE Wisdom DISEASE 08/19/2007   FIBROMYALGIA 08/19/2007   FATIGUE, CHRONIC 08/19/2007    REFERRING DIAG: R32 (ICD-10-CM) - Incontinence of urine in female  THERAPY DIAG:  Muscle weakness (generalized)  Unspecified lack of coordination  Rationale for Evaluation and Treatment Rehabilitation  PERTINENT HISTORY: Bladder tack/hysterectomy 1990, cholecystectomy; diabetitc  PRECAUTIONS: Frequent faller/fall risk  SUBJECTIVE: Patient states that she has not noticed much improvement since her first session. She has been trying to work on exercises. She has really struggled with urge suppression technique and having time to perform before she starts leaking when she gets the urge.  PAIN:  Are you having pain? No   SUBJECTIVE:                                                                                                                                                                                            SUBJECTIVE STATEMENT: Pt states that she has severe urgency. She has no issues with stress incontinence. Once it starts, she fully voids. She has been wearing Briefs for several years, but she feels like issue got much worse about a year ago. Now not on any diuretic and still having issue. Fluid intake: Yes: no caffeine, 2-3 16 oz bottles of water a day     Patient confirms identification and approves PT to assess pelvic floor and treatment Yes     PAIN:  Are you having pain? No       PRECAUTIONS: None   WEIGHT BEARING RESTRICTIONS No   FALLS:  Has patient fallen in last 6 months? Yes. 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   PLOF: Independent   PATIENT GOALS  decrease urge incontinence   PERTINENT HISTORY:  Bladder tack/hysterectomy 1990, cholecystectomy; diabetitc Sexual abuse: No   BOWEL MOVEMENT Pain with bowel movement: No Type of bowel movement:Frequency up to a week when she is having constipation, but alternates due to IBS and Strain Yes - not currently, but history of straining Fully empty rectum: Yes: - Leakage: Yes: when she is having diarrhea, she can't quite make it Pads: Yes: briefs Fiber supplement: Yes: takes when she is constipated   URINATION Pain with urination: No Fully empty bladder: Yes: sometimes she has to sit there for a few minutes- she does have post-void dribbling without sensation Stream:  WNL Urgency: Yes: sometimes no ability to wait Frequency: Wakes 3x/night; during the day varies between 30 minutes to several hours Leakage: Urge to void Pads: Yes: wearing briefs - 3/day, doesn't usually wear one at night   INTERCOURSE Pain with intercourse:  not sexually active, no pain in the past Using coconut oil daily for vaginal moisture   PREGNANCY Vaginal deliveries 2 Tearing Yes: one tear to rectum C-section deliveries 0 Currently pregnant No   OBJECTIVE 10/07/21:    COGNITION:            Overall cognitive status: Within functional limits for tasks assessed                          SENSATION:            Light touch: Appears intact            Proprioception: Appears intact     GAIT: Comments: Currently ambulating with rolling walking due to recent fall and injury to Lt knee pain - waiting to see if she needs surgery - does not typically ambulate with RW   POSTURE:  Posterior pelvic tilt       PELVIC MMT: 1-2/5 strength 3 second endurance 4 repetitions with decreasing coordination            PALPATION:   General  mild increase in urgency when palpating over bladder                 External Perineal Exam changes consistent with decreased estrogen                             Internal Pelvic  Floor Bil urethral tenderness and restriction reported as sharpness/stinging by patient   TONE: WNL   PROLAPSE: WNL   TODAY'S TREATMENT 11/04/21 Neuromuscular re-education: Pelvic floor contraction training: No emotional/communication barriers or cognitive limitation. Patient is motivated to learn. Patient understands and agrees with treatment goals and plan. PT explains patient will be examined in standing, sitting, and lying down to see how their muscles and joints work. When they are ready, they will be asked to remove their underwear so PT can examine their perineum. The patient is also given the option of providing their own chaperone as one is not provided in our facility. The patient also has the right and is explained the right to defer or refuse any part of the evaluation or treatment including the internal exam. With the patient's consent, PT will use one gloved finger to gently assess the muscles of the pelvic floor, seeing how  well it contracts and relaxes and if there is muscle symmetry. After, the patient will get dressed and PT and patient will discuss exam findings and plan of care. PT and patient discuss plan of care, schedule, attendance policy and HEP activities. Vaginal assessment of pelvic floor contraction Quick flicks 2 x 10 with multimodal cues for improved coordination and less bracing Long holds 5 x 10 seconds with multimodal cues for improved coordination Therapeutic exercise: Bridge with ball squeeze 10 x 5 seconds Clam shells, 10x bil Open books 10x Self-care: Strict bladder retraining Review of urge suppression technique   TREATMENT 10/07/21 EVAL Neuromuscular re-education: Pelvic floor contraction training: Quick flicks with breath coordination and multi-modal cues to help improve motor control and strength, 10x Self-care: Urge suppression technique       PATIENT EDUCATION:  Education details: Pelvic floor anatomy, urge suppression technique Person  educated: Patient Education method: Explanation, Demonstration, Tactile cues, Verbal cues, and Handouts Education comprehension: verbalized understanding     HOME EXERCISE PROGRAM: [id number]   ASSESSMENT:   CLINICAL IMPRESSION: Patient has not seen progress since first visit. Due to this, we returned to internal pelvic floor exam to make sure that she is performing pelvic floor contractions correctly. She demonstrates good coordination sometimes and then will revert to bracing and not engaging pelvic floor. Pelvic floor strength still maximally 2/5 when she does perform with good coordination. We really focused on feedback of squeezing around finger and feeling like she was drawing up and in. She was able to progress to long holds to work on endurance. We discussed strict bladder retraining to improve leaking without sensation and making sure she is being mindful of how much water she is drinking and how often she is going to the bathroom. Strengthening exercises for hips started to help further facilitate pelvic floor contractions. She will continue to benefit from skilled PT intervention in order to improve pelvic floor strength, decrease urgency/urge incontinence, and improve QOL.       OBJECTIVE IMPAIRMENTS decreased activity tolerance, decreased coordination, decreased endurance, decreased mobility, decreased strength, increased fascial restrictions, increased muscle spasms, and pain.    ACTIVITY LIMITATIONS community activity.    PERSONAL FACTORS 1-2 comorbidities: Bladder tack, hysterectomy, tubal ligation, cholecystectomy  are also affecting patient's functional outcome.      REHAB POTENTIAL: Good   CLINICAL DECISION MAKING: Stable/uncomplicated   EVALUATION COMPLEXITY: Low     GOALS: Goals reviewed with patient? Yes   SHORT TERM GOALS: Target date: 11/04/21 - Updated 11/04/21   Pt will be independent with HEP.    Baseline: Goal status: IN PROGRESS   2.  Pt will be  independent with the urge suppression technique and double voiding in order to improve bladder habits and decrease urinary incontinence.    Baseline:  Goal status: IN PROGRESS       LONG TERM GOALS: Target date: 12/16/21  - Updated 11/04/21 Pt will be independent with advanced HEP.    Baseline:  Goal status: IN PROGRESS   2.  Pt will demonstrate normal pelvic floor muscle tone and A/ROM, able to achieve 3/5 strength with contractions and 10 sec endurance, in order to provide appropriate lumbopelvic support in functional activities.    Baseline: 2/5 strength when performing with good motor control Goal status: IN PROGRESS   3.  Pt will be able to go 2-3 hours in between voids without urgency or incontinence in order to improve QOL and perform all functional activities with less difficulty.  Baseline:  Goal status: IN PROGRESS   4.  Pt will report no episodes of urinary or fecal incontinence in order to improve confidence in community activities and personal hygiene.    Baseline:  Goal status: IN PROGRESS       PLAN: PT FREQUENCY: 1x/week   PT DURATION: 10 weeks   PLANNED INTERVENTIONS: Therapeutic exercises, Therapeutic activity, Neuromuscular re-education, Balance training, Gait training, Patient/Family education, Joint mobilization, Dry Needling, Biofeedback, and Manual therapy   PLAN FOR NEXT SESSION: Progress pelvic floor strengthening to various standing positions. Progress pelvic floor strengthening. Begin core strengthening. Consider mobility exercises.    Heather Roberts, PT, DPT06/07/235:12 PM

## 2021-11-05 ENCOUNTER — Other Ambulatory Visit: Payer: Self-pay | Admitting: Internal Medicine

## 2021-11-06 ENCOUNTER — Other Ambulatory Visit: Payer: Self-pay | Admitting: Internal Medicine

## 2021-11-10 ENCOUNTER — Ambulatory Visit: Payer: Medicare Other

## 2021-11-10 DIAGNOSIS — M6281 Muscle weakness (generalized): Secondary | ICD-10-CM

## 2021-11-10 DIAGNOSIS — R279 Unspecified lack of coordination: Secondary | ICD-10-CM | POA: Diagnosis not present

## 2021-11-10 NOTE — Therapy (Signed)
OUTPATIENT PHYSICAL THERAPY TREATMENT NOTE   Patient Name: Kristy [name] MRN: [id number] DOB:[birth year], 76 y.o., female Today's Date: 11/10/2021  PCP: Colon Branch, MD REFERRING PROVIDER: Lavonia Drafts, MD  END OF SESSION:   PT End of Session - 11/10/21 1230     Visit Number 3    Date for PT Re-Evaluation 12/16/21    Authorization Type Medicare    Progress Note Due on Visit 10    PT Start Time 1230    PT Stop Time 1310    PT Time Calculation (min) 40 min    Activity Tolerance Patient tolerated treatment well    Behavior During Therapy WFL for tasks assessed/performed             Past Medical History:  Diagnosis Date   Allergy    Environmental, foods: shellfish, lactose intolerant, citrus, peanuts   Asthma    mild persistent   Atrophic vaginitis    BRCA gene mutation negative 2015   testing done at [facility]   Diabetes mellitus without complication (HCC)    Type 2   DJD (degenerative joint disease)    generalized joint pain, including back   Dysrhythmia    SVT   Family history of malignant neoplasm of breast    Fibromyalgia    Dr Amil Amen   GERD (gastroesophageal reflux disease)    Gout    Idiopathic, chronic   Hiatal hernia    History of kidney stones    Hyperlipidemia    Hypertension    Meniere's disease    R side worse, L side starting, see's Dr. Benjamine Mola.   Palpitations    PSVT on Holter monitoring   Shortness of breath    occ   Stroke [facility])    TIA  2014   SUI (stress urinary incontinence, female)    (resolved after surgery)   TIA (transient ischemic attack)    07/2012, see Dr Leonie Man   Vertigo    Past Surgical History:  Procedure Laterality Date   ABDOMINAL HYSTERECTOMY  1994   TAH,BSO-BLADDER NECK SUSPENSION   BREAST SURGERY     LUMPECTOMY-BENIGN Breast Bx X2   CARPAL TUNNEL RELEASE Right 2005   CATARACT EXTRACTION, BILATERAL Bilateral 2016   Cataract    CHOLECYSTECTOMY, LAPAROSCOPIC  2001   CYSTOCELE REPAIR  1994   EYE SURGERY      JOINT REPLACEMENT     bilatral knees;    KNEE ARTHROPLASTY  05/19/2012   Procedure: COMPUTER ASSISTED TOTAL KNEE ARTHROPLASTY;  Surgeon: Marybelle Killings, MD;  Location: [facility];  Service: Orthopedics;  Laterality: Left;  Left  Total Knee Arthroplasty   KNEE ARTHROPLASTY Right 09/03/2013   Procedure: COMPUTER ASSISTED TOTAL KNEE ARTHROPLASTY;  Surgeon: Marybelle Killings, MD;  Location: [place];  Service: Orthopedics;  Laterality: Right;  Right Total Knee Arthroplasty, Computer Assist   KNEE ARTHROSCOPY     bil   LUMBAR LAMINECTOMY/DECOMPRESSION MICRODISCECTOMY N/A 08/18/2020   Procedure: L4-5 decompression, possible left L4-5 microdiscectomy;  Surgeon: Marybelle Killings, MD;  Location: [place];  Service: Orthopedics;  Laterality: N/A;   OOPHORECTOMY     BSO   SURGERY FOR POSERIOR TIBIAL TENDON  2003   TONSILLECTOMY     TRIPLE FUSION LEFT ANKLE Left 2007   TUBAL LIGATION     Patient Active Problem List   Diagnosis Date Noted   Prepatellar bursitis, left knee 09/29/2021   Status post lumbar spine surgery for decompression of spinal cord 08/26/2020   Lumbar  disc herniation 05/28/2020   Spinal stenosis of lumbar region 05/28/2020   H/O vitamin D deficiency 09/21/2018   Elevated LFTs 12/25/2015   Dizzy 07/31/2015   PCP  notes >>>>>>>>>>>>>>>> 02/11/2015   Hyperparathyroidism ([place]) 06/07/2014   Benign paroxysmal positional vertigo 02/19/2014   FHx: BRCA2 gene positive    Family history of malignant neoplasm of breast    Family history of breast cancer 01/10/2014   Tachycardia 11/19/2013   Elevated LDL cholesterol level 06/11/2013   Obesity, Class I, BMI 30.0-34.9 (see actual BMI) 06/11/2013   Osteopenia 06/06/2013   Menopause 01/09/2013   History of TIA (transient ischemic attack) 07/15/2012   Annual physical exam 05/06/2011   SUI (stress urinary incontinence, female)    Atrophic vaginitis    GOUT, UNSPECIFIED 04/28/2010   Diabetes ([place]) 04/28/2010   Meniere's disease 07/30/2008   Hearing loss  07/30/2008   Essential hypertension 08/19/2007   ALLERGIC RHINITIS 08/19/2007   ASTHMA 08/19/2007   ACID REFLUX DISEASE 08/19/2007   IRRITABLE BOWEL SYNDROME 08/19/2007   DEGENERATIVE [place] DISEASE 08/19/2007   FIBROMYALGIA 08/19/2007   FATIGUE, CHRONIC 08/19/2007    REFERRING DIAG: R32 (ICD-10-CM) - Incontinence of urine in female  THERAPY DIAG:  Muscle weakness (generalized)  Unspecified lack of coordination  Rationale for Evaluation and Treatment Rehabilitation  PERTINENT HISTORY: Bladder tack/hysterectomy 1990, cholecystectomy; diabetitc  PRECAUTIONS: Frequent faller/fall risk  SUBJECTIVE: Patient states that she has not been able to perform exercises much over the last week due to not feeling well. She recently had son and his girlfriend move in and is dealing with difficulty mobilizing with walker in house. She is very fearful of falling and having a cluttered floor is bothering her. She states that she is trying to increase water - 4 bottles every 2 hours. She states that the more water she's been drinking and not drinking diet coke, the less leaking she has. She has had several leaks over the last week, but no emergent runs to the trip to the bathroom. She did not wake at all last night. She is feeling more confident in pelvic floor strengthening. No accidents in the last 3 days.   PAIN:  Are you having pain? No   SUBJECTIVE:                                                                                                                                                                                            SUBJECTIVE STATEMENT: Pt states that she has severe urgency. She has no issues with stress incontinence. Once it starts, she fully voids. She has been wearing Briefs for several years, but she  feels like issue got much worse about a year ago. Now not on any diuretic and still having issue. Fluid intake: Yes: no caffeine, 2-3 16 oz bottles of water a day     Patient  confirms identification and approves PT to assess pelvic floor and treatment Yes     PAIN:  Are you having pain? No       PRECAUTIONS: None   WEIGHT BEARING RESTRICTIONS No   FALLS:  Has patient fallen in last 6 months? Yes. 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   PLOF: Independent   PATIENT GOALS decrease urge incontinence   PERTINENT HISTORY:  Bladder tack/hysterectomy 1990, cholecystectomy; diabetitc Sexual abuse: No   BOWEL MOVEMENT Pain with bowel movement: No Type of bowel movement:Frequency up to a week when she is having constipation, but alternates due to IBS and Strain Yes - not currently, but history of straining Fully empty rectum: Yes: - Leakage: Yes: when she is having diarrhea, she can't quite make it Pads: Yes: briefs Fiber supplement: Yes: takes when she is constipated   URINATION Pain with urination: No Fully empty bladder: Yes: sometimes she has to sit there for a few minutes- she does have post-void dribbling without sensation Stream:  WNL Urgency: Yes: sometimes no ability to wait Frequency: Wakes 3x/night; during the day varies between 30 minutes to several hours Leakage: Urge to void Pads: Yes: wearing briefs - 3/day, doesn't usually wear one at night   INTERCOURSE Pain with intercourse:  not sexually active, no pain in the past Using coconut oil daily for vaginal moisture   PREGNANCY Vaginal deliveries 2 Tearing Yes: one tear to rectum C-section deliveries 0 Currently pregnant No   OBJECTIVE 10/07/21:    COGNITION:            Overall cognitive status: Within functional limits for tasks assessed                          SENSATION:            Light touch: Appears intact            Proprioception: Appears intact     GAIT: Comments: Currently ambulating with rolling walking due to recent fall and injury to  Lt knee pain - waiting to see if she needs surgery - does not typically ambulate with RW   POSTURE:  Posterior pelvic tilt       PELVIC MMT: 1-2/5 strength 3 second endurance 4 repetitions with decreasing coordination            PALPATION:   General  mild increase in urgency when palpating over bladder                 External Perineal Exam changes consistent with decreased estrogen                             Internal Pelvic Floor Bil urethral tenderness and restriction reported as sharpness/stinging by patient   TONE: WNL   PROLAPSE: WNL   TODAY'S TREATMENT 11/10/21 Neuromuscular re-education: Core retraining:  Transversus abdominus activation training with multimodal cues Core facilitation: Supine march 3 x 10 with multimodal cues for improved motor control Pelvic floor contraction training: Quick flicks in standing (regular stance, wide stance, bil staggered stance), 10x  each with breath coordination Exercises: Stretches/mobility: Lower trunk rotation 2 x 10 Open books 6x bil Strengthening: Bridge with ball squeeze 10x   TREATMENT 11/04/21 Neuromuscular re-education: Pelvic floor contraction training: No emotional/communication barriers or cognitive limitation. Patient is motivated to learn. Patient understands and agrees with treatment goals and plan. PT explains patient will be examined in standing, sitting, and lying down to see how their muscles and joints work. When they are ready, they will be asked to remove their underwear so PT can examine their perineum. The patient is also given the option of providing their own chaperone as one is not provided in our facility. The patient also has the right and is explained the right to defer or refuse any part of the evaluation or treatment including the internal exam. With the patient's consent, PT will use one gloved finger to gently assess the muscles of the pelvic floor, seeing how well it contracts and relaxes and if  there is muscle symmetry. After, the patient will get dressed and PT and patient will discuss exam findings and plan of care. PT and patient discuss plan of care, schedule, attendance policy and HEP activities. Vaginal assessment of pelvic floor contraction Quick flicks 2 x 10 with multimodal cues for improved coordination and less bracing Long holds 5 x 10 seconds with multimodal cues for improved coordination Therapeutic exercise: Bridge with ball squeeze 10 x 5 seconds Clam shells, 10x bil Open books 10x Self-care: Strict bladder retraining Review of urge suppression technique   TREATMENT 10/07/21 EVAL Neuromuscular re-education: Pelvic floor contraction training: Quick flicks with breath coordination and multi-modal cues to help improve motor control and strength, 10x Self-care: Urge suppression technique       PATIENT EDUCATION:  Education details: HEP progressions Person educated: Patient Education method: Consulting civil engineer, Demonstration, Tactile cues, Verbal cues, and Handouts Education comprehension: verbalized understanding     HOME EXERCISE PROGRAM: [id number]   ASSESSMENT:   CLINICAL IMPRESSION: Patient has seen good progress over the last week with starting bladder retraining and more pelvic floor contractions. Believe diet sodas are a bladder irritant for her. Timing of fluid intake has been really good for decreasing nocturia with no trips to the bathroom last night. We did review water intake so she is not overhydrating. She did very well with pelvic floor strengthening in various standing positions, but still feels like she gets a stronger contraction performing pelvic floor contraction with inhale compared to exhale. She had difficulty with core activation, but did achieve with supine march and reported good understanding of what she was supposed to be doing. Rt sided low back/hip bothered her some while lying on her back; she reported feeling good improvements in this  discomfort working on lower trunk rotations and open books. She will continue to benefit from skilled PT intervention in order to improve pelvic floor strength, decrease urgency/urge incontinence, and improve QOL.       OBJECTIVE IMPAIRMENTS decreased activity tolerance, decreased coordination, decreased endurance, decreased mobility, decreased strength, increased fascial restrictions, increased muscle spasms, and pain.    ACTIVITY LIMITATIONS community activity.    PERSONAL FACTORS 1-2 comorbidities: Bladder tack, hysterectomy, tubal ligation, cholecystectomy  are also affecting patient's functional outcome.      REHAB POTENTIAL: Good   CLINICAL DECISION MAKING: Stable/uncomplicated   EVALUATION COMPLEXITY: Low     GOALS: Goals reviewed with patient? Yes   SHORT TERM GOALS: Target date: 11/04/21 - Updated 11/04/21   Pt will be independent with HEP.  Baseline: Goal status: IN PROGRESS   2.  Pt will be independent with the urge suppression technique and double voiding in order to improve bladder habits and decrease urinary incontinence.    Baseline:  Goal status: IN PROGRESS       LONG TERM GOALS: Target date: 12/16/21  - Updated 11/04/21 Pt will be independent with advanced HEP.    Baseline:  Goal status: IN PROGRESS   2.  Pt will demonstrate normal pelvic floor muscle tone and A/ROM, able to achieve 3/5 strength with contractions and 10 sec endurance, in order to provide appropriate lumbopelvic support in functional activities.    Baseline: 2/5 strength when performing with good motor control Goal status: IN PROGRESS   3.  Pt will be able to go 2-3 hours in between voids without urgency or incontinence in order to improve QOL and perform all functional activities with less difficulty.    Baseline:  Goal status: IN PROGRESS   4.  Pt will report no episodes of urinary or fecal incontinence in order to improve confidence in community activities and personal hygiene.     Baseline:  Goal status: IN PROGRESS       PLAN: PT FREQUENCY: 1x/week   PT DURATION: 10 weeks   PLANNED INTERVENTIONS: Therapeutic exercises, Therapeutic activity, Neuromuscular re-education, Balance training, Gait training, Patient/Family education, Joint mobilization, Dry Needling, Biofeedback, and Manual therapy   PLAN FOR NEXT SESSION: Progress core/pelvic floor strengthening. Address scar tissue in low back if needed.   Heather Roberts, PT, DPT06/13/231:15 PM

## 2021-11-12 ENCOUNTER — Ambulatory Visit: Payer: Medicare Other | Admitting: Internal Medicine

## 2021-11-17 ENCOUNTER — Ambulatory Visit: Payer: Medicare Other

## 2021-11-17 DIAGNOSIS — M6281 Muscle weakness (generalized): Secondary | ICD-10-CM | POA: Diagnosis not present

## 2021-11-17 DIAGNOSIS — R279 Unspecified lack of coordination: Secondary | ICD-10-CM

## 2021-11-17 NOTE — Therapy (Signed)
OUTPATIENT PHYSICAL THERAPY TREATMENT NOTE   Patient Name: Kristy [name] MRN: [id number] DOB:November 21, 1945, 76 y.o., female Today's Date: 11/17/2021  PCP: Colon Branch, MD REFERRING PROVIDER: Lavonia Drafts, MD  END OF SESSION:   PT End of Session - 11/17/21 1229     Visit Number 4    Date for PT Re-Evaluation 12/16/21    Authorization Type Medicare    Progress Note Due on Visit 10    PT Start Time 1230    PT Stop Time 1310    PT Time Calculation (min) 40 min    Activity Tolerance Patient tolerated treatment well    Behavior During Therapy WFL for tasks assessed/performed              Past Medical History:  Diagnosis Date   Allergy    Environmental, foods: shellfish, lactose intolerant, citrus, peanuts   Asthma    mild persistent   Atrophic vaginitis    BRCA gene mutation negative 2015   testing done at [facility] [place]   Diabetes mellitus without complication (HCC)    Type 2   DJD (degenerative joint disease)    generalized joint pain, including back   Dysrhythmia    SVT   Family history of malignant neoplasm of breast    Fibromyalgia    Dr Amil Amen   GERD (gastroesophageal reflux disease)    Gout    Idiopathic, chronic   Hiatal hernia    History of kidney stones    Hyperlipidemia    Hypertension    Meniere's disease    R side worse, L side starting, see's Dr. Benjamine Mola.   Palpitations    PSVT on Holter monitoring   Shortness of breath    occ   Stroke [facility])    TIA  2014   SUI (stress urinary incontinence, female)    (resolved after surgery)   TIA (transient ischemic attack)    07/2012, see Dr Leonie Man   Vertigo    Past Surgical History:  Procedure Laterality Date   ABDOMINAL HYSTERECTOMY  1994   TAH,BSO-BLADDER NECK SUSPENSION   BREAST SURGERY     LUMPECTOMY-BENIGN Breast Bx X2   CARPAL TUNNEL RELEASE Right 2005   CATARACT EXTRACTION, BILATERAL Bilateral 2016   Cataract    CHOLECYSTECTOMY, LAPAROSCOPIC  2001   CYSTOCELE REPAIR  1994   EYE SURGERY      JOINT REPLACEMENT     bilatral knees;    KNEE ARTHROPLASTY  05/19/2012   Procedure: COMPUTER ASSISTED TOTAL KNEE ARTHROPLASTY;  Surgeon: Marybelle Killings, MD;  Location: [place];  Service: Orthopedics;  Laterality: Left;  Left  Total Knee Arthroplasty   KNEE ARTHROPLASTY Right 09/03/2013   Procedure: COMPUTER ASSISTED TOTAL KNEE ARTHROPLASTY;  Surgeon: Marybelle Killings, MD;  Location: [place];  Service: Orthopedics;  Laterality: Right;  Right Total Knee Arthroplasty, Computer Assist   KNEE ARTHROSCOPY     bil   LUMBAR LAMINECTOMY/DECOMPRESSION MICRODISCECTOMY N/A 08/18/2020   Procedure: L4-5 decompression, possible left L4-5 microdiscectomy;  Surgeon: Marybelle Killings, MD;  Location: [place];  Service: Orthopedics;  Laterality: N/A;   OOPHORECTOMY     BSO   SURGERY FOR POSERIOR TIBIAL TENDON  2003   TONSILLECTOMY     TRIPLE FUSION LEFT ANKLE Left 2007   TUBAL LIGATION     Patient Active Problem List   Diagnosis Date Noted   Prepatellar bursitis, left knee 09/29/2021   Status post lumbar spine surgery for decompression of spinal cord 08/26/2020  Lumbar disc herniation 05/28/2020   Spinal stenosis of lumbar region 05/28/2020   H/O vitamin D deficiency 09/21/2018   Elevated LFTs 12/25/2015   Dizzy 07/31/2015   PCP  notes >>>>>>>>>>>>>>>> 02/11/2015   Hyperparathyroidism ([place]) 06/07/2014   Benign paroxysmal positional vertigo 02/19/2014   FHx: BRCA2 gene positive    Family history of malignant neoplasm of breast    Family history of breast cancer 01/10/2014   Tachycardia 11/19/2013   Elevated LDL cholesterol level 06/11/2013   Obesity, Class I, BMI 30.0-34.9 (see actual BMI) 06/11/2013   Osteopenia 06/06/2013   Menopause 01/09/2013   History of TIA (transient ischemic attack) 07/15/2012   Annual physical exam 05/06/2011   SUI (stress urinary incontinence, female)    Atrophic vaginitis    GOUT, UNSPECIFIED 04/28/2010   Diabetes ([place]) 04/28/2010   Meniere's disease 07/30/2008   Hearing loss  07/30/2008   Essential hypertension 08/19/2007   ALLERGIC RHINITIS 08/19/2007   ASTHMA 08/19/2007   ACID REFLUX DISEASE 08/19/2007   IRRITABLE BOWEL SYNDROME 08/19/2007   DEGENERATIVE [place] DISEASE 08/19/2007   FIBROMYALGIA 08/19/2007   FATIGUE, CHRONIC 08/19/2007    REFERRING DIAG: R32 (ICD-10-CM) - Incontinence of urine in female  THERAPY DIAG:  Muscle weakness (generalized)  Unspecified lack of coordination  Rationale for Evaluation and Treatment Rehabilitation  PERTINENT HISTORY: Bladder tack/hysterectomy 1990, cholecystectomy; diabetitc  PRECAUTIONS: Frequent faller/fall risk  SUBJECTIVE: Patient states that she is doing very well. She has only had one episode of urinary incontinence in the last week.This episode happened after she had been watching TV - she got up and was on her way to the bathroom and just started leaking. She states that she did not wake up one time last night to urinate.  PAIN:  Are you having pain? No   SUBJECTIVE:                                                                                                                                                                                            SUBJECTIVE STATEMENT: Pt states that she has severe urgency. She has no issues with stress incontinence. Once it starts, she fully voids. She has been wearing Briefs for several years, but she feels like issue got much worse about a year ago. Now not on any diuretic and still having issue. Fluid intake: Yes: no caffeine, 2-3 16 oz bottles of water a day     Patient confirms identification and approves PT to assess pelvic floor and treatment Yes     PAIN:  Are you having pain? No       PRECAUTIONS: None   WEIGHT BEARING  RESTRICTIONS No   FALLS:  Has patient fallen in last 6 months? Yes. 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   PLOF: Independent   PATIENT GOALS decrease urge incontinence   PERTINENT HISTORY:  Bladder tack/hysterectomy 1990, cholecystectomy; diabetitc Sexual abuse: No   BOWEL MOVEMENT Pain with bowel movement: No Type of bowel movement:Frequency up to a week when she is having constipation, but alternates due to IBS and Strain Yes - not currently, but history of straining Fully empty rectum: Yes: - Leakage: Yes: when she is having diarrhea, she can't quite make it Pads: Yes: briefs Fiber supplement: Yes: takes when she is constipated   URINATION Pain with urination: No Fully empty bladder: Yes: sometimes she has to sit there for a few minutes- she does have post-void dribbling without sensation Stream:  WNL Urgency: Yes: sometimes no ability to wait Frequency: Wakes 3x/night; during the day varies between 30 minutes to several hours Leakage: Urge to void Pads: Yes: wearing briefs - 3/day, doesn't usually wear one at night   INTERCOURSE Pain with intercourse:  not sexually active, no pain in the past Using coconut oil daily for vaginal moisture   PREGNANCY Vaginal deliveries 2 Tearing Yes: one tear to rectum C-section deliveries 0 Currently pregnant No   OBJECTIVE 10/07/21:    COGNITION:            Overall cognitive status: Within functional limits for tasks assessed                          SENSATION:            Light touch: Appears intact            Proprioception: Appears intact     GAIT: Comments: Currently ambulating with rolling walking due to recent fall and injury to Lt knee pain - waiting to see if she needs surgery - does not typically ambulate with RW   POSTURE:  Posterior pelvic tilt       PELVIC MMT: 1-2/5 strength 3 second endurance 4 repetitions with decreasing coordination            PALPATION:   General  mild increase in urgency when palpating over bladder                 External Perineal Exam changes consistent with decreased  estrogen                             Internal Pelvic Floor Bil urethral tenderness and restriction reported as sharpness/stinging by patient   TONE: WNL   PROLAPSE: WNL   TODAY'S TREATMENT 11/17/21 Neuromuscular re-education: Core facilitation: Seated march with cues for core activation, 3 x 10 Horizontal abduction/extension for core activation in seated, 2 x 10, red band Exercises: Strengthening: Seated clams, 3 x 10, green band Hip adduction isometric 2 x 10, 5 sec holds Heel raises 2 x 10 Self-care: HEP performance - focus on seated exercises for better compliance since bed is so soft; also went over exercises that are fine to perform on soft surface of bed   TREATMENT 11/10/21 Neuromuscular re-education: Core retraining:  Transversus abdominus activation training with multimodal cues Core facilitation: Supine march 3 x 10 with multimodal cues for improved motor control Pelvic floor contraction training: Quick  flicks in standing (regular stance, wide stance, bil staggered stance), 10x each with breath coordination Exercises: Stretches/mobility: Lower trunk rotation 2 x 10 Open books 6x bil Strengthening: Bridge with ball squeeze 10x   TREATMENT 11/04/21 Neuromuscular re-education: Pelvic floor contraction training: No emotional/communication barriers or cognitive limitation. Patient is motivated to learn. Patient understands and agrees with treatment goals and plan. PT explains patient will be examined in standing, sitting, and lying down to see how their muscles and joints work. When they are ready, they will be asked to remove their underwear so PT can examine their perineum. The patient is also given the option of providing their own chaperone as one is not provided in our facility. The patient also has the right and is explained the right to defer or refuse any part of the evaluation or treatment including the internal exam. With the patient's consent, PT will use one  gloved finger to gently assess the muscles of the pelvic floor, seeing how well it contracts and relaxes and if there is muscle symmetry. After, the patient will get dressed and PT and patient will discuss exam findings and plan of care. PT and patient discuss plan of care, schedule, attendance policy and HEP activities. Vaginal assessment of pelvic floor contraction Quick flicks 2 x 10 with multimodal cues for improved coordination and less bracing Long holds 5 x 10 seconds with multimodal cues for improved coordination Therapeutic exercise: Bridge with ball squeeze 10 x 5 seconds Clam shells, 10x bil Open books 10x Self-care: Strict bladder retraining Review of urge suppression technique       PATIENT EDUCATION:  Education details: HEP progressions Person educated: Patient Education method: Consulting civil engineer, Demonstration, Tactile cues, Verbal cues, and Handouts Education comprehension: verbalized understanding     HOME EXERCISE PROGRAM: [id number]   ASSESSMENT:   CLINICAL IMPRESSION: Patient is making excellent progress demonstrated by only one leak in the last week and having slept through the night without any trips to the bathroom. She is doing great with increasing water intake and cutting out soda. We reviewed HEP and discussed which exercises are fine to perform lying down of softer surface of bed. Instead of having her get on floor to perform other strengthening exercises, she progressed to seated strengthening exercises for core/hips with incorporation of pelvic floor and breath. She tolerated all exercises very well and feels comfortable performing at home. She will continue to benefit from skilled PT intervention in order to improve pelvic floor strength, decrease urgency/urge incontinence, and improve QOL.       OBJECTIVE IMPAIRMENTS decreased activity tolerance, decreased coordination, decreased endurance, decreased mobility, decreased strength, increased fascial restrictions,  increased muscle spasms, and pain.    ACTIVITY LIMITATIONS community activity.    PERSONAL FACTORS 1-2 comorbidities: Bladder tack, hysterectomy, tubal ligation, cholecystectomy  are also affecting patient's functional outcome.      REHAB POTENTIAL: Good   CLINICAL DECISION MAKING: Stable/uncomplicated   EVALUATION COMPLEXITY: Low     GOALS: Goals reviewed with patient? Yes   SHORT TERM GOALS: Target date: 11/04/21 - Updated 11/04/21   Pt will be independent with HEP.    Baseline: Goal status: IN PROGRESS   2.  Pt will be independent with the urge suppression technique and double voiding in order to improve bladder habits and decrease urinary incontinence.    Baseline:  Goal status: IN PROGRESS       LONG TERM GOALS: Target date: 12/16/21  - Updated 11/04/21 Pt will be independent with advanced HEP.  Baseline:  Goal status: IN PROGRESS   2.  Pt will demonstrate normal pelvic floor muscle tone and A/ROM, able to achieve 3/5 strength with contractions and 10 sec endurance, in order to provide appropriate lumbopelvic support in functional activities.    Baseline: 2/5 strength when performing with good motor control Goal status: IN PROGRESS   3.  Pt will be able to go 2-3 hours in between voids without urgency or incontinence in order to improve QOL and perform all functional activities with less difficulty.    Baseline:  Goal status: IN PROGRESS   4.  Pt will report no episodes of urinary or fecal incontinence in order to improve confidence in community activities and personal hygiene.    Baseline:  Goal status: IN PROGRESS       PLAN: PT FREQUENCY: 1x/week   PT DURATION: 10 weeks   PLANNED INTERVENTIONS: Therapeutic exercises, Therapeutic activity, Neuromuscular re-education, Balance training, Gait training, Patient/Family education, Joint mobilization, Dry Needling, Biofeedback, and Manual therapy   PLAN FOR NEXT SESSION: Progress core/pelvic floor  strengthening. Address scar tissue in low back if needed.   Heather Roberts, PT, DPT06/20/231:12 PM

## 2021-11-19 ENCOUNTER — Other Ambulatory Visit: Payer: Self-pay | Admitting: Internal Medicine

## 2021-11-24 ENCOUNTER — Ambulatory Visit: Payer: Medicare Other

## 2021-11-24 DIAGNOSIS — M6281 Muscle weakness (generalized): Secondary | ICD-10-CM

## 2021-11-24 DIAGNOSIS — R279 Unspecified lack of coordination: Secondary | ICD-10-CM | POA: Diagnosis not present

## 2021-11-26 ENCOUNTER — Encounter: Payer: Self-pay | Admitting: Internal Medicine

## 2021-11-26 ENCOUNTER — Ambulatory Visit (INDEPENDENT_AMBULATORY_CARE_PROVIDER_SITE_OTHER): Payer: Medicare Other | Admitting: Internal Medicine

## 2021-11-26 VITALS — BP 124/66 | HR 87 | Temp 97.6°F | Resp 18 | Ht 65.0 in | Wt 217.2 lb

## 2021-11-26 DIAGNOSIS — E7800 Pure hypercholesterolemia, unspecified: Secondary | ICD-10-CM | POA: Diagnosis not present

## 2021-11-26 DIAGNOSIS — I10 Essential (primary) hypertension: Secondary | ICD-10-CM

## 2021-11-26 DIAGNOSIS — M109 Gout, unspecified: Secondary | ICD-10-CM | POA: Diagnosis not present

## 2021-11-26 DIAGNOSIS — D649 Anemia, unspecified: Secondary | ICD-10-CM | POA: Diagnosis not present

## 2021-11-26 DIAGNOSIS — E1169 Type 2 diabetes mellitus with other specified complication: Secondary | ICD-10-CM | POA: Diagnosis not present

## 2021-11-26 DIAGNOSIS — D509 Iron deficiency anemia, unspecified: Secondary | ICD-10-CM

## 2021-11-26 LAB — LIPID PANEL
Cholesterol: 115 mg/dL (ref 0–200)
HDL: 41.1 mg/dL (ref >39.00)
NonHDL: 73.76
Total CHOL/HDL Ratio: 3
Triglycerides: 217 mg/dL — ABNORMAL HIGH (ref 0.0–149.0)
VLDL: 43.4 mg/dL — ABNORMAL HIGH (ref 0.0–40.0)

## 2021-11-26 LAB — CBC WITH DIFFERENTIAL/PLATELET
Basophils Absolute: 0.1 10*3/uL (ref 0.0–0.1)
Basophils Relative: 1.2 % (ref 0.0–3.0)
Eosinophils Absolute: 0.2 10*3/uL (ref 0.0–0.7)
Eosinophils Relative: 2.2 % (ref 0.0–5.0)
HCT: 34.2 % — ABNORMAL LOW (ref 36.0–46.0)
Hemoglobin: 10.4 g/dL — ABNORMAL LOW (ref 12.0–15.0)
Lymphocytes Relative: 31.5 % (ref 12.0–46.0)
Lymphs Abs: 2.5 10*3/uL (ref 0.7–4.0)
MCHC: 30.3 g/dL (ref 30.0–36.0)
MCV: 63.8 fl — ABNORMAL LOW (ref 78.0–100.0)
Monocytes Absolute: 0.8 10*3/uL (ref 0.1–1.0)
Monocytes Relative: 10.6 % (ref 3.0–12.0)
Neutro Abs: 4.3 10*3/uL (ref 1.4–7.7)
Neutrophils Relative %: 54.5 % (ref 43.0–77.0)
Platelets: 322 10*3/uL (ref 150.0–400.0)
RBC: 5.36 Mil/uL — ABNORMAL HIGH (ref 3.87–5.11)
RDW: 20.1 % — ABNORMAL HIGH (ref 11.5–15.5)
WBC: 8 10*3/uL (ref 4.0–10.5)

## 2021-11-26 LAB — LDL CHOLESTEROL, DIRECT: Direct LDL: 47 mg/dL

## 2021-11-26 LAB — FERRITIN: Ferritin: 11.3 ng/mL (ref 10.0–291.0)

## 2021-11-26 LAB — URIC ACID: Uric Acid, Serum: 2.3 mg/dL — ABNORMAL LOW (ref 2.4–7.0)

## 2021-11-26 LAB — HEMOGLOBIN A1C: Hgb A1c MFr Bld: 6.6 % — ABNORMAL HIGH (ref 4.6–6.5)

## 2021-11-26 LAB — IRON: Iron: 22 ug/dL — ABNORMAL LOW (ref 42–145)

## 2021-11-26 MED ORDER — TETANUS-DIPHTH-ACELL PERTUSSIS 5-2.5-18.5 LF-MCG/0.5 IM SUSP: 0.5000 mL | Freq: Once | INTRAMUSCULAR | 0 refills | Status: AC

## 2021-11-26 MED ORDER — SHINGRIX 50 MCG/0.5ML IM SUSR: 0.5000 mL | Freq: Once | INTRAMUSCULAR | 1 refills | Status: AC

## 2021-11-26 NOTE — Progress Notes (Signed)
Subjective:    Patient ID: Kristy [name], female    DOB: [birth year], 76 y.o.   MRN: [id number]  DOS:  11/26/2021 Type of visit - description: f/u  Routine visit, we addressed her chronic medical issues. BP was in the low side at some point but she is checking and getting good readings. Diet continue to be healthy. Last hemoglobin lower than usual, denies blood in the stools or vaginal bleeding, has IBS symptoms >>> at baseline  Review of Systems See above   Past Medical History:  Diagnosis Date   Allergy    Environmental, foods: shellfish, lactose intolerant, citrus, peanuts   Asthma    mild persistent   Atrophic vaginitis    BRCA gene mutation negative 2015   testing done at [place]   Diabetes mellitus without complication (HCC)    Type 2   DJD (degenerative joint disease)    generalized joint pain, including back   Dysrhythmia    SVT   Family history of malignant neoplasm of breast    Fibromyalgia    Dr Amil Amen   GERD (gastroesophageal reflux disease)    Gout    Idiopathic, chronic   Hiatal hernia    History of kidney stones    Hyperlipidemia    Hypertension    Meniere's disease    R side worse, L side starting, see's Dr. Benjamine Mola.   Palpitations    PSVT on Holter monitoring   Shortness of breath    occ   Stroke [facility])    TIA  2014   SUI (stress urinary incontinence, female)    (resolved after surgery)   TIA (transient ischemic attack)    07/2012, see Dr Leonie Man   Vertigo     Past Surgical History:  Procedure Laterality Date   ABDOMINAL HYSTERECTOMY  1994   TAH,BSO-BLADDER NECK SUSPENSION   BREAST SURGERY     LUMPECTOMY-BENIGN Breast Bx X2   CARPAL TUNNEL RELEASE Right 2005   CATARACT EXTRACTION, BILATERAL Bilateral 2016   Cataract    CHOLECYSTECTOMY, LAPAROSCOPIC  2001   CYSTOCELE REPAIR  1994   EYE SURGERY     JOINT REPLACEMENT     bilatral knees;    KNEE ARTHROPLASTY  05/19/2012   Procedure: COMPUTER ASSISTED TOTAL KNEE ARTHROPLASTY;  Surgeon:  Marybelle Killings, MD;  Location: [place];  Service: Orthopedics;  Laterality: Left;  Left  Total Knee Arthroplasty   KNEE ARTHROPLASTY Right 09/03/2013   Procedure: COMPUTER ASSISTED TOTAL KNEE ARTHROPLASTY;  Surgeon: Marybelle Killings, MD;  Location: [place];  Service: Orthopedics;  Laterality: Right;  Right Total Knee Arthroplasty, Computer Assist   KNEE ARTHROSCOPY     bil   LUMBAR LAMINECTOMY/DECOMPRESSION MICRODISCECTOMY N/A 08/18/2020   Procedure: L4-5 decompression, possible left L4-5 microdiscectomy;  Surgeon: Marybelle Killings, MD;  Location: [place];  Service: Orthopedics;  Laterality: N/A;   OOPHORECTOMY     BSO   SURGERY FOR POSERIOR TIBIAL TENDON  2003   TONSILLECTOMY     TRIPLE FUSION LEFT ANKLE Left 2007   TUBAL LIGATION      Current Outpatient Medications  Medication Instructions   aspirin EC 81 mg, Oral, Daily   atorvastatin (LIPITOR) 10 MG tablet TAKE 1 TABLET BY MOUTH EVERYDAY AT BEDTIME   Calcium Carbonate (CALCIUM 600 PO) 600 mg, Oral, 2 times daily, (1600 & bedtime)   Cholecalciferol 2,000 Units, Oral, Daily at bedtime   diltiazem (CARDIZEM CD) 240 MG 24 hr capsule TAKE 1 CAPSULE BY MOUTH  EVERY DAY   EPINEPHrine (EPI-PEN) 0.3 mg, See admin instructions   febuxostat (ULORIC) 40 mg, Oral, Every evening   fluticasone (FLONASE) 50 MCG/ACT nasal spray 2 sprays, Each Nare, Every morning   fluticasone (FLOVENT HFA) 110 MCG/ACT inhaler 2 puffs, Inhalation, Daily   JARDIANCE 10 MG TABS tablet TAKE 1 TABLET DAILY BEFORE BREAKFAST   levalbuterol (XOPENEX HFA) 45 MCG/ACT inhaler 2 puffs, Inhalation, Every 4 hours PRN   levocetirizine (XYZAL) 5 mg, Oral, Daily-1600   losartan (COZAAR) 25 mg, Oral, Daily   metFORMIN (GLUCOPHAGE) 1,275 mg, Oral, 2 times daily with meals   metoprolol succinate (TOPROL-XL) 50 MG 24 hr tablet TAKE 1 TABLET BY MOUTH DAILY WITH OR IMMEDIATELY FOLLOWING A MEAL   nortriptyline (PAMELOR) 25 mg, Oral, Daily-1600   omeprazole (PRILOSEC) 40 MG capsule TAKE 1 CAPSULE BY MOUTH  EVERY DAY   pioglitazone (ACTOS) 30 MG tablet TAKE 1 TABLET BY MOUTH EVERY DAY   Tdap (BOOSTRIX) 5-2.5-18.5 LF-MCG/0.5 injection 0.5 mLs, Intramuscular,  Once   vitamin E 400 Units, Oral, Daily-1600   Zoster Vaccine Adjuvanted (SHINGRIX) injection 0.5 mLs, Intramuscular,  Once       Objective:   Physical Exam BP 124/66   Pulse 87   Temp 97.6 F (36.4 C) (Oral)   Resp 18   Ht 5' 5" (1.651 m)   Wt 217 lb 4 oz (98.5 kg)   SpO2 93%   BMI 36.15 kg/m  General:   Well developed, NAD, BMI noted. HEENT:  Normocephalic . Face symmetric, atraumatic Lungs:  CTA B Normal respiratory effort, no intercostal retractions, no accessory muscle use. Heart: RRR,  no murmur.  DM foot exam good pedal pulses, no edema, pinprick examination normal Skin: Not pale. Not jaundice Neurologic:  alert & oriented X3.  Speech normal, gait appropriate for age and unassisted Psych--  Cognition and judgment appear intact.  Cooperative with normal attention span and concentration.  Behavior appropriate. No anxious or depressed appearing.      Assessment    Assessment DM w/ h/o TIA HTN  Hyperlipidemia  Insomnia  Asthma , allergies-- sees Dr Harold Hedge  GERD, hiatal hernia , h/o IBS Obesity  CV: ---Palpitations: PSVT by Holter, Dr Gwenlyn Found ---TIA, dx 2014,last visit w/  Dr. Leonie Man 09-2015, RTC prn ENT: Dr Arnette Norris -Vertigo (improved with vestibular rehabilitation 2015) -HOH- aids, brain  MRI 03/2019.   -Menier's (Dx   2020) Increased LFTs --- CT abdomen 2014: NormalLiver, negative hepatitis serologies at rheumatology per patient MSK: *Osteopenia: dexa per Gyn *Fibromyalgia, DJD, Gout : per Dr. Amil Amen, on [place]  FH breast cancer , sister is BRCA2 (+), pt is  (-) GU: Kidney stones, h/o; Urinary incontinence; Atrophic vaginitis Hyperparathyroidism h/o slt increased PTH 2015 , normal PTH 05-2014  OSA dx 08-2021, started CPAP 10/2021  PLAN: DM: Last A1c stable at 6.9.  Ambulatory CBGs typically in the  130s, in the last 2 weeks they have been slightly high ~ 150s.  Has not changed her diet.  Feet exam normal  plan: ContinueActos, metformin, Jardiance.  Check A1c HTN: Last CMP okay.  Ambulatory BPs range from 106/59 to 111/68.  In the last couple of weeks it has been in the 140s. Plan: Continue diltiazem, losartan, metoprolol.  Continue monitoring BPs Mild anemia, last hemoglobin slightly low.  No GI symptoms, check CBC, iron, ferritin OSA: OSA dx 08-2021, started CPAP 10/2021 DJD: Sees rheumatology, they request a uric acid Urinary incontinence: Saw gynecology, they recommend physical therapy. Preventive care: Recommend Tdap,  Shingrix, COVID booster, flu shot. RTC 4 months

## 2021-11-26 NOTE — Patient Instructions (Addendum)
Recommend to proceed with the following vaccines at your pharmacy: Shingrix (shingles) Tdap (tetanus) Covid booster (bivalent) Flu shot this fall  Continue checking your blood pressure BP GOAL is between 110/65 and  135/85. If it is consistently higher or lower, let me know    GO TO THE LAB : Get the blood work     [place], [place] back for a checkup in 4 months

## 2021-11-26 NOTE — Assessment & Plan Note (Signed)
DM: Last A1c stable at 6.9.  Ambulatory CBGs typically in the 130s, in the last 2 weeks they have been slightly high ~ 150s.  Has not changed her diet.  Feet exam normal  plan: ContinueActos, metformin, Jardiance.  Check A1c HTN: Last CMP okay.  Ambulatory BPs range from 106/59 to 111/68.  In the last couple of weeks it has been in the 140s. Plan: Continue diltiazem, losartan, metoprolol.  Continue monitoring BPs Mild anemia, last hemoglobin slightly low.  No GI symptoms, check CBC, iron, ferritin OSA: OSA dx 08-2021, started CPAP 10/2021 DJD: Sees rheumatology, they request a uric acid Urinary incontinence: Saw gynecology, they recommend physical therapy. Preventive care: Recommend Tdap, Shingrix, COVID booster, flu shot. RTC 4 months

## 2021-11-27 NOTE — Addendum Note (Signed)
Addended byDamita Dunnings D on: 11/27/2021 09:08 AM   Modules accepted: Orders

## 2021-11-30 ENCOUNTER — Ambulatory Visit: Payer: Medicare Other | Attending: Obstetrics & Gynecology

## 2021-11-30 DIAGNOSIS — R279 Unspecified lack of coordination: Secondary | ICD-10-CM | POA: Insufficient documentation

## 2021-11-30 DIAGNOSIS — M6281 Muscle weakness (generalized): Secondary | ICD-10-CM | POA: Diagnosis not present

## 2021-11-30 NOTE — Therapy (Signed)
OUTPATIENT PHYSICAL THERAPY TREATMENT NOTE   Patient Name: Kristy [name] MRN: [id number] DOB:07/01/1945, 76 y.o., female Today's Date: 11/30/2021  PCP: Colon Branch, MD REFERRING PROVIDER: Lavonia Drafts, MD  END OF SESSION:   PT End of Session - 11/30/21 1233     Visit Number 6    Date for PT Re-Evaluation 12/16/21    Authorization Type Medicare    Progress Note Due on Visit 10    PT Start Time 1230    PT Stop Time 1310    PT Time Calculation (min) 40 min    Activity Tolerance Patient tolerated treatment well    Behavior During Therapy WFL for tasks assessed/performed               Past Medical History:  Diagnosis Date   Allergy    Environmental, foods: shellfish, lactose intolerant, citrus, peanuts   Asthma    mild persistent   Atrophic vaginitis    BRCA gene mutation negative 2015   testing done at [place]   Diabetes mellitus without complication (HCC)    Type 2   DJD (degenerative joint disease)    generalized joint pain, including back   Dysrhythmia    SVT   Family history of malignant neoplasm of breast    Fibromyalgia    Dr Amil Amen   GERD (gastroesophageal reflux disease)    Gout    Idiopathic, chronic   Hiatal hernia    History of kidney stones    Hyperlipidemia    Hypertension    Meniere's disease    R side worse, L side starting, see's Dr. Benjamine Mola.   Palpitations    PSVT on Holter monitoring   Shortness of breath    occ   Stroke [facility])    TIA  2014   SUI (stress urinary incontinence, female)    (resolved after surgery)   TIA (transient ischemic attack)    07/2012, see Dr Leonie Man   Vertigo    Past Surgical History:  Procedure Laterality Date   ABDOMINAL HYSTERECTOMY  1994   TAH,BSO-BLADDER NECK SUSPENSION   BREAST SURGERY     LUMPECTOMY-BENIGN Breast Bx X2   CARPAL TUNNEL RELEASE Right 2005   CATARACT EXTRACTION, BILATERAL Bilateral 2016   Cataract    CHOLECYSTECTOMY, LAPAROSCOPIC  2001   CYSTOCELE REPAIR  1994   EYE  SURGERY     JOINT REPLACEMENT     bilatral knees;    KNEE ARTHROPLASTY  05/19/2012   Procedure: COMPUTER ASSISTED TOTAL KNEE ARTHROPLASTY;  Surgeon: Marybelle Killings, MD;  Location: [place];  Service: Orthopedics;  Laterality: Left;  Left  Total Knee Arthroplasty   KNEE ARTHROPLASTY Right 09/03/2013   Procedure: COMPUTER ASSISTED TOTAL KNEE ARTHROPLASTY;  Surgeon: Marybelle Killings, MD;  Location: [place];  Service: Orthopedics;  Laterality: Right;  Right Total Knee Arthroplasty, Computer Assist   KNEE ARTHROSCOPY     bil   LUMBAR LAMINECTOMY/DECOMPRESSION MICRODISCECTOMY N/A 08/18/2020   Procedure: L4-5 decompression, possible left L4-5 microdiscectomy;  Surgeon: Marybelle Killings, MD;  Location: [place];  Service: Orthopedics;  Laterality: N/A;   OOPHORECTOMY     BSO   SURGERY FOR POSERIOR TIBIAL TENDON  2003   TONSILLECTOMY     TRIPLE FUSION LEFT ANKLE Left 2007   TUBAL LIGATION     Patient Active Problem List   Diagnosis Date Noted   Prepatellar bursitis, left knee 09/29/2021   Status post lumbar spine surgery for decompression of spinal cord 08/26/2020  Lumbar disc herniation 05/28/2020   Spinal stenosis of lumbar region 05/28/2020   H/O vitamin D deficiency 09/21/2018   Elevated LFTs 12/25/2015   Dizzy 07/31/2015   PCP  notes >>>>>>>>>>>>>>>> 02/11/2015   Hyperparathyroidism ([place]) 06/07/2014   Benign paroxysmal positional vertigo 02/19/2014   FHx: BRCA2 gene positive    Family history of malignant neoplasm of breast    Family history of breast cancer 01/10/2014   Tachycardia 11/19/2013   Elevated LDL cholesterol level 06/11/2013   Obesity, Class I, BMI 30.0-34.9 (see actual BMI) 06/11/2013   Osteopenia 06/06/2013   Menopause 01/09/2013   History of TIA (transient ischemic attack) 07/15/2012   Annual physical exam 05/06/2011   SUI (stress urinary incontinence, female)    Atrophic vaginitis    GOUT, UNSPECIFIED 04/28/2010   Diabetes ([place]) 04/28/2010   Meniere's disease 07/30/2008    Hearing loss 07/30/2008   Essential hypertension 08/19/2007   ALLERGIC RHINITIS 08/19/2007   ASTHMA 08/19/2007   ACID REFLUX DISEASE 08/19/2007   IRRITABLE BOWEL SYNDROME 08/19/2007   DEGENERATIVE [place] DISEASE 08/19/2007   FIBROMYALGIA 08/19/2007   FATIGUE, CHRONIC 08/19/2007    REFERRING DIAG: R32 (ICD-10-CM) - Incontinence of urine in female  THERAPY DIAG:  Muscle weakness (generalized)  Unspecified lack of coordination  Rationale for Evaluation and Treatment Rehabilitation  PERTINENT HISTORY: Bladder tack/hysterectomy 1990, cholecystectomy; diabetitc  PRECAUTIONS: Frequent faller/fall risk  SUBJECTIVE: Patient states that she slept 9 hours straight last night. She feels like she continue to have more control over her bladder. She definitely notices that diet cokes are a bladder irritant for her and she is trying to dilute/cut out. She had an episode after having soda where she stood up and had urgency, but was successfully able to make it to the bathroom without any leaking which she feels like was good improvements.   PAIN:  Are you having pain? No   SUBJECTIVE:                                                                                                                                                                                            SUBJECTIVE STATEMENT: Pt states that she has severe urgency. She has no issues with stress incontinence. Once it starts, she fully voids. She has been wearing Briefs for several years, but she feels like issue got much worse about a year ago. Now not on any diuretic and still having issue. Fluid intake: Yes: no caffeine, 2-3 16 oz bottles of water a day     Patient confirms identification and approves PT to assess pelvic floor and treatment Yes     PAIN:  Are you having pain? No       PRECAUTIONS: None   WEIGHT BEARING RESTRICTIONS No   FALLS:  Has patient fallen in last 6 months? Yes. 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   PLOF: Independent   PATIENT GOALS decrease urge incontinence   PERTINENT HISTORY:  Bladder tack/hysterectomy 1990, cholecystectomy; diabetitc Sexual abuse: No   BOWEL MOVEMENT Pain with bowel movement: No Type of bowel movement:Frequency up to a week when she is having constipation, but alternates due to IBS and Strain Yes - not currently, but history of straining Fully empty rectum: Yes: - Leakage: Yes: when she is having diarrhea, she can't quite make it Pads: Yes: briefs Fiber supplement: Yes: takes when she is constipated   URINATION Pain with urination: No Fully empty bladder: Yes: sometimes she has to sit there for a few minutes- she does have post-void dribbling without sensation Stream:  WNL Urgency: Yes: sometimes no ability to wait Frequency: Wakes 3x/night; during the day varies between 30 minutes to several hours Leakage: Urge to void Pads: Yes: wearing briefs - 3/day, doesn't usually wear one at night   INTERCOURSE Pain with intercourse:  not sexually active, no pain in the past Using coconut oil daily for vaginal moisture   PREGNANCY Vaginal deliveries 2 Tearing Yes: one tear to rectum C-section deliveries 0 Currently pregnant No   OBJECTIVE 10/07/21:    COGNITION:            Overall cognitive status: Within functional limits for tasks assessed                          SENSATION:            Light touch: Appears intact            Proprioception: Appears intact     GAIT: Comments: Currently ambulating with rolling walking due to recent fall and injury to Lt knee pain - waiting to see if she needs surgery - does not typically ambulate with RW   POSTURE:  Posterior pelvic tilt       PELVIC MMT: 1-2/5 strength 3 second endurance 4 repetitions with decreasing coordination            PALPATION:   General  mild  increase in urgency when palpating over bladder                 External Perineal Exam changes consistent with decreased estrogen                             Internal Pelvic Floor Bil urethral tenderness and restriction reported as sharpness/stinging by patient   TONE: WNL   PROLAPSE: WNL   TODAY'S TREATMENT 11/30/21 Exercises: Strengthening: Rows seated green band 3 x 10 Bil UE extension green band 3 x 10 D2 flexion red band 2 x 10 bil Standing heel raises 3 x 10 bil UE support Standing hip abduction 2 x 10 bil UE with support Seated anterior weight hold with march 3 x 10    TREATMENT 11/24/21 Exercises: Strengthening: Seated clam shells 3 x 10 blue band Pelvic shifting with hip adduction 2 x 10 Heel raises 4 x 10 HS curls 15x bil red band Seated rows 2 x 10 green band   TREATMENT  11/17/21 Neuromuscular re-education: Core facilitation: Seated march with cues for core activation, 3 x 10 Horizontal abduction/extension for core activation in seated, 2 x 10, red band Exercises: Strengthening: Seated clams, 3 x 10, green band Hip adduction isometric 2 x 10, 5 sec holds Heel raises 2 x 10 Self-care: HEP performance - focus on seated exercises for better compliance since bed is so soft; also went over exercises that are fine to perform on soft surface of bed       PATIENT EDUCATION:  Education details: HEP progressions Person educated: Patient Education method: Explanation, Demonstration, Tactile cues, Verbal cues, and Handouts Education comprehension: verbalized understanding     HOME EXERCISE PROGRAM: [id number]   ASSESSMENT:   CLINICAL IMPRESSION: Patient continues to see excellent progress with reduced bladder dysfunction demonstrated by sleeping 9 hours without waking last night and increased control at all times. She did very well with previous exercise progressions and additions to help focus on functional activation and core and pelvic floor. She will  continue to benefit from skilled PT intervention in order to improve pelvic floor strength, decrease urgency/urge incontinence, and improve QOL.       OBJECTIVE IMPAIRMENTS decreased activity tolerance, decreased coordination, decreased endurance, decreased mobility, decreased strength, increased fascial restrictions, increased muscle spasms, and pain.    ACTIVITY LIMITATIONS community activity.    PERSONAL FACTORS 1-2 comorbidities: Bladder tack, hysterectomy, tubal ligation, cholecystectomy  are also affecting patient's functional outcome.      REHAB POTENTIAL: Good   CLINICAL DECISION MAKING: Stable/uncomplicated   EVALUATION COMPLEXITY: Low     GOALS: Goals reviewed with patient? Yes   SHORT TERM GOALS: Target date: 11/04/21 - Updated 11/04/21   Pt will be independent with HEP.    Baseline: Goal status: IN PROGRESS   2.  Pt will be independent with the urge suppression technique and double voiding in order to improve bladder habits and decrease urinary incontinence.    Baseline:  Goal status: IN PROGRESS       LONG TERM GOALS: Target date: 12/16/21  - Updated 11/04/21 Pt will be independent with advanced HEP.    Baseline:  Goal status: IN PROGRESS   2.  Pt will demonstrate normal pelvic floor muscle tone and A/ROM, able to achieve 3/5 strength with contractions and 10 sec endurance, in order to provide appropriate lumbopelvic support in functional activities.    Baseline: 2/5 strength when performing with good motor control Goal status: IN PROGRESS   3.  Pt will be able to go 2-3 hours in between voids without urgency or incontinence in order to improve QOL and perform all functional activities with less difficulty.    Baseline:  Goal status: IN PROGRESS   4.  Pt will report no episodes of urinary or fecal incontinence in order to improve confidence in community activities and personal hygiene.    Baseline:  Goal status: IN PROGRESS       PLAN: PT FREQUENCY:  1x/week   PT DURATION: 10 weeks   PLANNED INTERVENTIONS: Therapeutic exercises, Therapeutic activity, Neuromuscular re-education, Balance training, Gait training, Patient/Family education, Joint mobilization, Dry Needling, Biofeedback, and Manual therapy   PLAN FOR NEXT SESSION: Progress core/pelvic floor strengthening. D/C.    Heather Roberts, PT, DPT07/03/231:18 PM

## 2021-12-03 ENCOUNTER — Telehealth: Payer: Self-pay | Admitting: *Deleted

## 2021-12-03 DIAGNOSIS — D649 Anemia, unspecified: Secondary | ICD-10-CM

## 2021-12-03 MED ORDER — FERROUS FUMARATE 325 (106 FE) MG PO TABS: 1.0000 | ORAL_TABLET | Freq: Two times a day (BID) | ORAL | 3 refills | Status: DC

## 2021-12-03 NOTE — Telephone Encounter (Signed)
Dr. Larose Kells- I don't believe we have the stool cards that do 3 days. Please advise.

## 2021-12-03 NOTE — Telephone Encounter (Signed)
-----  Message from Colon Branch, MD sent at 12/02/2021  5:35 PM EDT ----- GI sent me a message regards anemia. They recommend the following: Stool cards x3.  Needs to pick up the kit Start iron 325 1 tablet twice daily.  Send prescription, #60 and 3 refills

## 2021-12-03 NOTE — Telephone Encounter (Signed)
Given for 3 separate kids

## 2021-12-07 ENCOUNTER — Ambulatory Visit: Payer: Medicare Other

## 2021-12-07 DIAGNOSIS — R279 Unspecified lack of coordination: Secondary | ICD-10-CM | POA: Diagnosis not present

## 2021-12-07 DIAGNOSIS — M6281 Muscle weakness (generalized): Secondary | ICD-10-CM

## 2021-12-07 NOTE — Therapy (Signed)
OUTPATIENT PHYSICAL THERAPY TREATMENT NOTE   Patient Name: Kristy [name] MRN: [id number] DOB:August 02, 1945, 76 y.o., female Today's Date: 12/07/2021  PCP: Colon Branch, MD REFERRING PROVIDER: Lavonia Drafts, MD  END OF SESSION:   PT End of Session - 12/07/21 1232     Visit Number 7    Date for PT Re-Evaluation 12/16/21    Authorization Type Medicare    Progress Note Due on Visit 10    PT Start Time 1230    PT Stop Time 1310    PT Time Calculation (min) 40 min    Activity Tolerance Patient tolerated treatment well    Behavior During Therapy WFL for tasks assessed/performed                Past Medical History:  Diagnosis Date   Allergy    Environmental, foods: shellfish, lactose intolerant, citrus, peanuts   Asthma    mild persistent   Atrophic vaginitis    BRCA gene mutation negative 2015   testing done at [place]   Diabetes mellitus without complication (HCC)    Type 2   DJD (degenerative joint disease)    generalized joint pain, including back   Dysrhythmia    SVT   Family history of malignant neoplasm of breast    Fibromyalgia    Dr Amil Amen   GERD (gastroesophageal reflux disease)    Gout    Idiopathic, chronic   Hiatal hernia    History of kidney stones    Hyperlipidemia    Hypertension    Meniere's disease    R side worse, L side starting, see's Dr. Benjamine Mola.   Palpitations    PSVT on Holter monitoring   Shortness of breath    occ   Stroke [facility])    TIA  2014   SUI (stress urinary incontinence, female)    (resolved after surgery)   TIA (transient ischemic attack)    07/2012, see Dr Leonie Man   Vertigo    Past Surgical History:  Procedure Laterality Date   ABDOMINAL HYSTERECTOMY  1994   TAH,BSO-BLADDER NECK SUSPENSION   BREAST SURGERY     LUMPECTOMY-BENIGN Breast Bx X2   CARPAL TUNNEL RELEASE Right 2005   CATARACT EXTRACTION, BILATERAL Bilateral 2016   Cataract    CHOLECYSTECTOMY, LAPAROSCOPIC  2001   CYSTOCELE REPAIR  1994   EYE  SURGERY     JOINT REPLACEMENT     bilatral knees;    KNEE ARTHROPLASTY  05/19/2012   Procedure: COMPUTER ASSISTED TOTAL KNEE ARTHROPLASTY;  Surgeon: Marybelle Killings, MD;  Location: [place];  Service: Orthopedics;  Laterality: Left;  Left  Total Knee Arthroplasty   KNEE ARTHROPLASTY Right 09/03/2013   Procedure: COMPUTER ASSISTED TOTAL KNEE ARTHROPLASTY;  Surgeon: Marybelle Killings, MD;  Location: [place];  Service: Orthopedics;  Laterality: Right;  Right Total Knee Arthroplasty, Computer Assist   KNEE ARTHROSCOPY     bil   LUMBAR LAMINECTOMY/DECOMPRESSION MICRODISCECTOMY N/A 08/18/2020   Procedure: L4-5 decompression, possible left L4-5 microdiscectomy;  Surgeon: Marybelle Killings, MD;  Location: [place];  Service: Orthopedics;  Laterality: N/A;   OOPHORECTOMY     BSO   SURGERY FOR POSERIOR TIBIAL TENDON  2003   TONSILLECTOMY     TRIPLE FUSION LEFT ANKLE Left 2007   TUBAL LIGATION     Patient Active Problem List   Diagnosis Date Noted   Prepatellar bursitis, left knee 09/29/2021   Status post lumbar spine surgery for decompression of spinal cord 08/26/2020  Lumbar disc herniation 05/28/2020   Spinal stenosis of lumbar region 05/28/2020   H/O vitamin D deficiency 09/21/2018   Elevated LFTs 12/25/2015   Dizzy 07/31/2015   PCP  notes >>>>>>>>>>>>>>>> 02/11/2015   Hyperparathyroidism ([place]) 06/07/2014   Benign paroxysmal positional vertigo 02/19/2014   FHx: BRCA2 gene positive    Family history of malignant neoplasm of breast    Family history of breast cancer 01/10/2014   Tachycardia 11/19/2013   Elevated LDL cholesterol level 06/11/2013   Obesity, Class I, BMI 30.0-34.9 (see actual BMI) 06/11/2013   Osteopenia 06/06/2013   Menopause 01/09/2013   History of TIA (transient ischemic attack) 07/15/2012   Annual physical exam 05/06/2011   SUI (stress urinary incontinence, female)    Atrophic vaginitis    GOUT, UNSPECIFIED 04/28/2010   Diabetes ([place]) 04/28/2010   Meniere's disease 07/30/2008    Hearing loss 07/30/2008   Essential hypertension 08/19/2007   ALLERGIC RHINITIS 08/19/2007   ASTHMA 08/19/2007   ACID REFLUX DISEASE 08/19/2007   IRRITABLE BOWEL SYNDROME 08/19/2007   DEGENERATIVE [place] DISEASE 08/19/2007   FIBROMYALGIA 08/19/2007   FATIGUE, CHRONIC 08/19/2007    REFERRING DIAG: R32 (ICD-10-CM) - Incontinence of urine in female  THERAPY DIAG:  Muscle weakness (generalized)  Unspecified lack of coordination  Rationale for Evaluation and Treatment Rehabilitation  PERTINENT HISTORY: Bladder tack/hysterectomy 1990, cholecystectomy; diabetitc  PRECAUTIONS: Frequent faller/fall risk  SUBJECTIVE: Patient states that she is feeling very dizzy today and would like to avoid lying down. She feels like she has been making progress and working on [employer]. She is generally not waking up at night to urinate at all. She states that she is only wearing 1 brief a day (instead of 3) and it has been dry each day the last week. She is still wearing one at night as well, but there is no wetness in the morning. Pt states that bowel movements have been much more regular.   PAIN:  Are you having pain? No   SUBJECTIVE:                                                                                                                                                                                            SUBJECTIVE STATEMENT: Pt states that she has severe urgency. She has no issues with stress incontinence. Once it starts, she fully voids. She has been wearing Briefs for several years, but she feels like issue got much worse about a year ago. Now not on any diuretic and still having issue. Fluid intake: Yes: no caffeine, 2-3 16 oz bottles of water a day     Patient confirms identification  and approves PT to assess pelvic floor and treatment Yes     PAIN:  Are you having pain? No       PRECAUTIONS: None   WEIGHT BEARING RESTRICTIONS No   FALLS:  Has patient fallen in last 6 months?  Yes. 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   PLOF: Independent   PATIENT GOALS decrease urge incontinence   PERTINENT HISTORY:  Bladder tack/hysterectomy 1990, cholecystectomy; diabetitc Sexual abuse: No   BOWEL MOVEMENT Pain with bowel movement: No Type of bowel movement:Frequency up to a week when she is having constipation, but alternates due to IBS and Strain Yes - not currently, but history of straining Fully empty rectum: Yes: - Leakage: Yes: when she is having diarrhea, she can't quite make it Pads: Yes: briefs Fiber supplement: Yes: takes when she is constipated   URINATION Pain with urination: No Fully empty bladder: Yes: sometimes she has to sit there for a few minutes- she does have post-void dribbling without sensation Stream:  WNL Urgency: Yes: sometimes no ability to wait Frequency: Wakes 3x/night; during the day varies between 30 minutes to several hours Leakage: Urge to void Pads: Yes: wearing briefs - 3/day, doesn't usually wear one at night   INTERCOURSE Pain with intercourse:  not sexually active, no pain in the past Using coconut oil daily for vaginal moisture   PREGNANCY Vaginal deliveries 2 Tearing Yes: one tear to rectum C-section deliveries 0 Currently pregnant No   OBJECTIVE 12/07/21: pt declines internal vaginal exam to assess pelvic floor strength, but states that she can feel strong pull up and inward now.   10/07/21:    COGNITION:            Overall cognitive status: Within functional limits for tasks assessed                          SENSATION:            Light touch: Appears intact            Proprioception: Appears intact     GAIT: Comments: Currently ambulating with rolling walking due to recent fall and injury to Lt knee pain - waiting to see if she needs surgery - does not typically ambulate with RW    POSTURE:  Posterior pelvic tilt       PELVIC MMT: 1-2/5 strength 3 second endurance 4 repetitions with decreasing coordination            PALPATION:   General  mild increase in urgency when palpating over bladder                 External Perineal Exam changes consistent with decreased estrogen                             Internal Pelvic Floor Bil urethral tenderness and restriction reported as sharpness/stinging by patient   TONE: WNL   PROLAPSE: WNL   TODAY'S TREATMENT 12/07/21 Exercises: Strengthening: D2 flexion red band 2 x 10 bil Horizontal abduction 3 x 10 red band Hamstring curls 2 x 10 bil red band Sit<>stands 3 x 5 Seated Bil UE extensions green band 3 x 10   TREATMENT 11/30/21 Exercises: Strengthening: Rows seated green band 3 x 10 Bil UE extension green band  3 x 10 D2 flexion red band 2 x 10 bil Standing heel raises 3 x 10 bil UE support Standing hip abduction 2 x 10 bil UE with support Seated anterior weight hold with march 3 x 10    TREATMENT 11/24/21 Exercises: Strengthening: Seated clam shells 3 x 10 blue band Pelvic shifting with hip adduction 2 x 10 Heel raises 4 x 10 HS curls 15x bil red band Seated rows 2 x 10 green band       PATIENT EDUCATION:  Education details: HEP review Person educated: Patient Education method: Consulting civil engineer, Demonstration, Tactile cues, Verbal cues, and Handouts Education comprehension: verbalized understanding     HOME EXERCISE PROGRAM: [id number]   ASSESSMENT:   CLINICAL IMPRESSION: Patient has made excellent progress demonstrated by no episodes of urinary incontinence in the last week (day or night), no nocturia, and significant decrease in urgency. She has met all goals and states that she is confident in HEP and continuing on her own. Due to progress and having the tools to continue making progress, she is prepared to D/C at this time; she was encouraged to call with any questions or concerns.       OBJECTIVE IMPAIRMENTS decreased activity tolerance, decreased coordination, decreased endurance, decreased mobility, decreased strength, increased fascial restrictions, increased muscle spasms, and pain.    ACTIVITY LIMITATIONS community activity.    PERSONAL FACTORS 1-2 comorbidities: Bladder tack, hysterectomy, tubal ligation, cholecystectomy  are also affecting patient's functional outcome.      REHAB POTENTIAL: Good   CLINICAL DECISION MAKING: Stable/uncomplicated   EVALUATION COMPLEXITY: Low     GOALS: Goals reviewed with patient? Yes   SHORT TERM GOALS: Target date: 11/04/21 - Updated 11/04/21   Pt will be independent with HEP.    Baseline: Goal status: IN PROGRESS   2.  Pt will be independent with the urge suppression technique and double voiding in order to improve bladder habits and decrease urinary incontinence.    Baseline:  Goal status: IN PROGRESS       LONG TERM GOALS: Target date: 12/16/21  - Updated 12/07/21  Pt will be independent with advanced HEP.    Baseline:  Goal status: MET 12/07/21   2.  Pt will demonstrate normal pelvic floor muscle tone and A/ROM, able to achieve 3/5 strength with contractions and 10 sec endurance, in order to provide appropriate lumbopelvic support in functional activities.    Baseline: Not formally assessed today, but due to functional improvements, believe she has good improvements in pelvic floor strength and endurance Goal status:  MET 12/07/21   3.  Pt will be able to go 2-3 hours in between voids without urgency or incontinence in order to improve QOL and perform all functional activities with less difficulty.    Baseline:  Goal status: MET 12/07/21   4.  Pt will report no episodes of urinary or fecal incontinence in order to improve confidence in community activities and personal hygiene.    Baseline:  Goal status: MET 12/07/21       PLAN: PT FREQUENCY: NA   PT DURATION: NA   PLANNED INTERVENTIONS: D/C   PLAN FOR  NEXT SESSION: D/C   PHYSICAL THERAPY DISCHARGE SUMMARY  Visits from Start of Care: 7  Current functional level related to goals / functional outcomes: MET   Remaining deficits: See above   Education / Equipment: HEP   Patient agrees to discharge. Patient goals were met. Patient is being discharged due to meeting the stated rehab goals.  Heather Roberts, PT, DPT07/10/232:40 PM

## 2021-12-09 ENCOUNTER — Encounter: Payer: Self-pay | Admitting: Orthopaedic Surgery

## 2021-12-09 ENCOUNTER — Ambulatory Visit (INDEPENDENT_AMBULATORY_CARE_PROVIDER_SITE_OTHER): Payer: Medicare Other | Admitting: Orthopaedic Surgery

## 2021-12-09 VITALS — BP 119/74 | HR 92 | Ht 65.0 in | Wt 217.0 lb

## 2021-12-09 DIAGNOSIS — M7042 Prepatellar bursitis, left knee: Secondary | ICD-10-CM

## 2021-12-09 NOTE — Progress Notes (Signed)
Office Visit Note   Patient: Kristy [name]           Date of Birth: April 27, 1946           MRN: [id number] Visit Date: 12/09/2021              Requested by: Colon Branch, [address] [place],   [id number] PCP: Colon Branch, MD   Assessment & Plan: Visit Diagnoses:  1. Prepatellar bursitis, left knee     Plan: We discussed TED hose but she is hesitant to use these which would have to be long teds during the summer when it is hot.  She is applying lotion.  There is no residual hematoma present.  I discussed though there is no evidence of any active bleeding going on and no knee hemarthrosis at this point.  X-rays of her knee look good I can follow-up on an as-needed basis.  Patient on baby aspirin may have had some GI bleeding responsible for drop in hemoglobin.  Her indices did show a low rather than high MCV.  Follow-up with me on an as-needed basis.  Follow-Up Instructions: No follow-ups on file.   Orders:  No orders of the defined types were placed in this encounter.  No orders of the defined types were placed in this encounter.     Procedures: No procedures performed   Clinical Data: No additional findings.   Subjective: Chief Complaint  Patient presents with   Left Knee - Pain, Follow-up    HPI 76 year old female now 2 and half months follow-up after fall 09/18/2021 with prepatellar bursal hematoma after left total knee arthroplasty.  Hematoma resolved we previously aspirated got minimal fluid.  She has dark discoloration along the midportion of the incision medially.  No cellulitis.  She is amatory with a cane.  She states labs showed she had a hemoglobin of 10.4 she usually runs 13-14 and likely getting a Hemoccult test shortly.  History of colon polyps 7 removed last colonoscopy I think with last year.  Review of Systems all systems noncontributory.   Objective: Vital Signs: BP 119/74   Pulse 92   Ht '5\' 5"'  (1.651 m)   Wt 217 lb (98.4 kg)    BMI 36.11 kg/m   Physical Exam Constitutional:      Appearance: She is well-developed.  HENT:     Head: Normocephalic.     Right Ear: External ear normal.     Left Ear: External ear normal. There is no impacted cerumen.  Eyes:     Pupils: Pupils are equal, round, and reactive to light.  Neck:     Thyroid: No thyromegaly.     Trachea: No tracheal deviation.  Cardiovascular:     Rate and Rhythm: Normal rate.  Pulmonary:     Effort: Pulmonary effort is normal.  Abdominal:     Palpations: Abdomen is soft.  Musculoskeletal:     Cervical back: No rigidity.  Skin:    General: Skin is warm and dry.  Neurological:     Mental Status: She is alert and oriented to person, place, and time.  Psychiatric:        Behavior: Behavior normal.     Ortho Exam 6 x 4 cm area along the medial aspect of the knee incision that remains darkly discolored.  She has mild pitting edema both lower extremities.  Pulses are normal good hip range of motion.  Specialty Comments:  No specialty  comments available.  Imaging: No results found.   PMFS History: Patient Active Problem List   Diagnosis Date Noted   Prepatellar bursitis, left knee 09/29/2021   Status post lumbar spine surgery for decompression of spinal cord 08/26/2020   Lumbar disc herniation 05/28/2020   Spinal stenosis of lumbar region 05/28/2020   H/O vitamin D deficiency 09/21/2018   Elevated LFTs 12/25/2015   Dizzy 07/31/2015   PCP  notes >>>>>>>>>>>>>>>> 02/11/2015   Hyperparathyroidism ([place]) 06/07/2014   Benign paroxysmal positional vertigo 02/19/2014   FHx: BRCA2 gene positive    Family history of malignant neoplasm of breast    Family history of breast cancer 01/10/2014   Tachycardia 11/19/2013   Elevated LDL cholesterol level 06/11/2013   Obesity, Class I, BMI 30.0-34.9 (see actual BMI) 06/11/2013   Osteopenia 06/06/2013   Menopause 01/09/2013   History of TIA (transient ischemic attack) 07/15/2012   Annual physical  exam 05/06/2011   SUI (stress urinary incontinence, female)    Atrophic vaginitis    GOUT, UNSPECIFIED 04/28/2010   Diabetes ([place]) 04/28/2010   Meniere's disease 07/30/2008   Hearing loss 07/30/2008   Essential hypertension 08/19/2007   ALLERGIC RHINITIS 08/19/2007   ASTHMA 08/19/2007   ACID REFLUX DISEASE 08/19/2007   IRRITABLE BOWEL SYNDROME 08/19/2007   DEGENERATIVE [place] DISEASE 08/19/2007   FIBROMYALGIA 08/19/2007   FATIGUE, CHRONIC 08/19/2007   Past Medical History:  Diagnosis Date   Allergy    Environmental, foods: shellfish, lactose intolerant, citrus, peanuts   Asthma    mild persistent   Atrophic vaginitis    BRCA gene mutation negative 2015   testing done at [place]   Diabetes mellitus without complication (HCC)    Type 2   DJD (degenerative joint disease)    generalized joint pain, including back   Dysrhythmia    SVT   Family history of malignant neoplasm of breast    Fibromyalgia    Dr Amil Amen   GERD (gastroesophageal reflux disease)    Gout    Idiopathic, chronic   Hiatal hernia    History of kidney stones    Hyperlipidemia    Hypertension    Meniere's disease    R side worse, L side starting, see's Dr. Benjamine Mola.   Palpitations    PSVT on Holter monitoring   Shortness of breath    occ   Stroke [facility])    TIA  2014   SUI (stress urinary incontinence, female)    (resolved after surgery)   TIA (transient ischemic attack)    07/2012, see Dr Leonie Man   Vertigo     Family History  Problem Relation Age of Onset   Breast cancer Mother 29       deceased 45   Cancer Father        Brain tumor; path?; deceased 15   Breast cancer Sister 109       bilateral breast ca @ 63; BRCA2 positive   Hypertension Sister    Hemochromatosis Sister    Thalassemia Sister    Other Sister        negative for BRCA1 BRCA2   Heart failure Sister        due to chemo?   CAD Son        MI   Diabetes Son    Leukemia Son    Lymphoma Maternal Aunt        deceased 46s    Breast cancer Cousin        BRCA2 positive  Diabetes Other    Breast cancer Other        distant maternal female relatives with breast cancer   Colon cancer Neg Hx    Colon polyps Neg Hx    Esophageal cancer Neg Hx    Rectal cancer Neg Hx    Stomach cancer Neg Hx     Past Surgical History:  Procedure Laterality Date   ABDOMINAL HYSTERECTOMY  1994   TAH,BSO-BLADDER NECK SUSPENSION   BREAST SURGERY     LUMPECTOMY-BENIGN Breast Bx X2   CARPAL TUNNEL RELEASE Right 2005   CATARACT EXTRACTION, BILATERAL Bilateral 2016   Cataract    CHOLECYSTECTOMY, LAPAROSCOPIC  2001   CYSTOCELE REPAIR  1994   EYE SURGERY     JOINT REPLACEMENT     bilatral knees;    KNEE ARTHROPLASTY  05/19/2012   Procedure: COMPUTER ASSISTED TOTAL KNEE ARTHROPLASTY;  Surgeon: Marybelle Killings, MD;  Location: [place];  Service: Orthopedics;  Laterality: Left;  Left  Total Knee Arthroplasty   KNEE ARTHROPLASTY Right 09/03/2013   Procedure: COMPUTER ASSISTED TOTAL KNEE ARTHROPLASTY;  Surgeon: Marybelle Killings, MD;  Location: [place];  Service: Orthopedics;  Laterality: Right;  Right Total Knee Arthroplasty, Computer Assist   KNEE ARTHROSCOPY     bil   LUMBAR LAMINECTOMY/DECOMPRESSION MICRODISCECTOMY N/A 08/18/2020   Procedure: L4-5 decompression, possible left L4-5 microdiscectomy;  Surgeon: Marybelle Killings, MD;  Location: [place];  Service: Orthopedics;  Laterality: N/A;   OOPHORECTOMY     BSO   SURGERY FOR POSERIOR TIBIAL TENDON  2003   TONSILLECTOMY     TRIPLE FUSION LEFT ANKLE Left 2007   TUBAL LIGATION     Social History   Occupational History   Occupation: Hospital doctor-- retired     Fish farm manager: RETIRED  Tobacco Use   Smoking status: Never   Smokeless tobacco: Never  Vaping Use   Vaping Use: Never used  Substance and Sexual Activity   Alcohol use: No    Alcohol/week: 0.0 standard drinks of alcohol   Drug use: No   Sexual activity: Not Currently    Birth control/protection: Surgical

## 2021-12-14 ENCOUNTER — Encounter: Payer: Self-pay | Admitting: Neurology

## 2021-12-16 ENCOUNTER — Ambulatory Visit (INDEPENDENT_AMBULATORY_CARE_PROVIDER_SITE_OTHER): Payer: Medicare Other | Admitting: Neurology

## 2021-12-16 ENCOUNTER — Encounter: Payer: Self-pay | Admitting: Neurology

## 2021-12-16 VITALS — BP 111/56 | HR 83 | Ht 65.0 in | Wt 222.0 lb

## 2021-12-16 DIAGNOSIS — G4733 Obstructive sleep apnea (adult) (pediatric): Secondary | ICD-10-CM | POA: Diagnosis not present

## 2021-12-16 DIAGNOSIS — Z9989 Dependence on other enabling machines and devices: Secondary | ICD-10-CM

## 2021-12-16 DIAGNOSIS — G4734 Idiopathic sleep related nonobstructive alveolar hypoventilation: Secondary | ICD-10-CM

## 2021-12-16 NOTE — Patient Instructions (Signed)
Please continue using your autoPAP regularly. While your insurance requires that you use PAP at least 4 hours each night on 70% of the nights, I recommend, that you not skip any nights and use it throughout the night if you can. Getting used to PAP and staying with the treatment long term does take time and patience and discipline. Untreated obstructive sleep apnea when it is moderate to severe can have an adverse impact on cardiovascular health and raise her risk for heart disease, arrhythmias, hypertension, congestive heart failure, stroke and diabetes. Untreated obstructive sleep apnea causes sleep disruption, nonrestorative sleep, and sleep deprivation. This can have an impact on your day to day functioning and cause daytime sleepiness and impairment of cognitive function, memory loss, mood disturbance, and problems focussing. Using PAP regularly can improve these symptoms.  As discussed, we will do an overnight oxygen level test, called ONO, and your DME company will call and set this up for one night, while you also use your autoPAP as usual. We will call you with the results. This is to make sure that your oxygen levels stay in the 90s, while you are treated with autoPAP for your OSA. Remember, your oxygen levels were lower during your sleep study and the technician put you on supplemental oxygen at the time.  The hope is that with AutoPap therapy your oxygen levels are adequate.   If all goes well and your oxygen test is good, we will plan a follow-up in sleep clinic in 1 year, you can schedule with the nurse practitioner.

## 2021-12-16 NOTE — Progress Notes (Signed)
Subjective:    Patient ID: Kristy [name] is a 76 y.o. female.  HPI    Interim history:   Kristy [name] is a 76 year old right-handed woman with an underlying medical history of TIA, stroke, dizziness (followed previously by my colleague, Dr. Leonie Man), carotid artery stenosis, asthma, allergies, diabetes, degenerative joint disease, fibromyalgia, vertigo, reflux disease, gout, history of kidney stones, hypertension, hyperlipidemia, palpitations with history of PSVT, and obesity, who presents for follow-up consultation of her obstructive sleep apnea after interim testing and starting AutoPap therapy.  The patient is accompanied by her husband today.  I first met her at the request of her primary care physician on 09/15/2021, at which time she reported snoring and excessive daytime somnolence.  She was advised to proceed with a sleep study.  She had a baseline sleep study on 09/21/2021 which showed a sleep efficiency of 75.3%, sleep latency delayed at 41.5 minutes and REM latency delayed at 169 minutes.  She had mild to moderate sleep fragmentation with wake after sleep onset at 76.5 minutes.  She had a decreased percentage of REM sleep at 9.1%.  Total AHI based on hypopneas was 11.5/h, REM AHI 68/h, supine AHI 16.5/h.  Average oxygen saturation was 94%, nadir was 84%.  Time below 89% saturation was 62 minutes for the night.  She did get started on supplemental oxygen at 1 L/min as she had lower average oxygen saturations around 87% during sleep.  Her sleep related average oxygen saturations improved to 90% after supplemental oxygen.  Based on her test results she was advised to start AutoPap therapy.  Her set up date was 11/03/2021.  She has a ResMed air sense 11 AutoSet machine.  Today, 12/16/2021: I reviewed her AutoPap compliance data from 11/15/2021 through 12/14/2021, which is a total of 30 days, during which time she used her machine 29 days with percent use days greater than 4 hours at 97%, indicating excellent  compliance with an average usage of 7 hours and 22 minutes, residual AHI borderline at 4.8/h, average pressure for the 95th percentile at 9.9 cm with a range of 5 to 11 cm, leak acceptable with the 95th percentile at 9.2 L/min.  She reports having adjusted well to AutoPap therapy.  She has benefited from treatment, her headaches are better.  Her energy level is a little better as well.  She is using a F20 fullface mask medium but it rides up too high on the nasal bridge and causes a sore feeling, sometimes when she pulls the mask down it goes to level below her chin.  She has already talked to her DME representative and requested for her next mask a small size.  She is willing to do an overnight pulse oximetry test as she remembers that she was treated with supplemental oxygen during her sleep study.  Overall, she is motivated to continue with treatment and has benefited from it.  She is compliant with it, 1 night she pulled the whole machine down and it was not working properly afterwards, hence the lapse in treatment for that 1 night.  Other than that she is fully compliant.  She does have asthma which flares up from time to time.  Sometimes she has flareup of her reflux as well.  She follows with ENT for vertigo.  Of note, her Epworth sleepiness score is 13 out of 24, previously was 20.  Previously:   09/15/21: (She) reports snoring and excessive daytime somnolence. I reviewed your office note from 07/15/2021. Her Epworth  sleepiness score is 20 out of 24, fatigue severity score is 63 out of 63.  Symptoms have become worse over time.  She has had intermittent vertigo and has seen Dr. Benjamine Mola.  She goes to bed generally around 10:30 AM and rise time is around 9 AM.  Some nights she has a hard time sleeping, and some nights she sleeps a lot.  She has nocturia about twice per average 9 and has had occasional morning headaches in the front.  She reports a family history of sleep apnea, her son has a CPAP machine.  She  sleeps on a wedge, it helps her reflux symptoms in her breathing.  She wakes up with dry mouth.  She has allergy symptoms and asthma.  She does not drink caffeine daily.  She does not drink alcohol, she is a non-smoker. The patient's allergies, current medications, family history, past medical history, past social history, past surgical history and problem list were reviewed and updated as appropriate.    Her Past Medical History Is Significant For: Past Medical History:  Diagnosis Date   Allergy    Environmental, foods: shellfish, lactose intolerant, citrus, peanuts   Asthma    mild persistent   Atrophic vaginitis    BRCA gene mutation negative 2015   testing done at [facility]   Diabetes mellitus without complication (HCC)    Type 2   DJD (degenerative joint disease)    generalized joint pain, including back   Dysrhythmia    SVT   Family history of malignant neoplasm of breast    Fibromyalgia    Dr Amil Amen   GERD (gastroesophageal reflux disease)    Gout    Idiopathic, chronic   Hiatal hernia    History of kidney stones    Hyperlipidemia    Hypertension    Meniere's disease    R side worse, L side starting, see's Dr. Benjamine Mola.   Palpitations    PSVT on Holter monitoring   Shortness of breath    occ   Stroke [facility])    TIA  2014   SUI (stress urinary incontinence, female)    (resolved after surgery)   TIA (transient ischemic attack)    07/2012, see Dr Leonie Man   Vertigo     Her Past Surgical History Is Significant For: Past Surgical History:  Procedure Laterality Date   ABDOMINAL HYSTERECTOMY  1994   TAH,BSO-BLADDER NECK SUSPENSION   BREAST SURGERY     LUMPECTOMY-BENIGN Breast Bx X2   CARPAL TUNNEL RELEASE Right 2005   CATARACT EXTRACTION, BILATERAL Bilateral 2016   Cataract    CHOLECYSTECTOMY, LAPAROSCOPIC  2001   CYSTOCELE REPAIR  1994   EYE SURGERY     JOINT REPLACEMENT     bilatral knees;    KNEE ARTHROPLASTY  05/19/2012   Procedure: COMPUTER ASSISTED TOTAL  KNEE ARTHROPLASTY;  Surgeon: Marybelle Killings, MD;  Location: [place];  Service: Orthopedics;  Laterality: Left;  Left  Total Knee Arthroplasty   KNEE ARTHROPLASTY Right 09/03/2013   Procedure: COMPUTER ASSISTED TOTAL KNEE ARTHROPLASTY;  Surgeon: Marybelle Killings, MD;  Location: [place];  Service: Orthopedics;  Laterality: Right;  Right Total Knee Arthroplasty, Computer Assist   KNEE ARTHROSCOPY     bil   LUMBAR LAMINECTOMY/DECOMPRESSION MICRODISCECTOMY N/A 08/18/2020   Procedure: L4-5 decompression, possible left L4-5 microdiscectomy;  Surgeon: Marybelle Killings, MD;  Location: [place];  Service: Orthopedics;  Laterality: N/A;   OOPHORECTOMY     BSO   SURGERY FOR  POSERIOR TIBIAL TENDON  2003   TONSILLECTOMY     TRIPLE FUSION LEFT ANKLE Left 2007   TUBAL LIGATION      Her Family History Is Significant For: Family History  Problem Relation Age of Onset   Breast cancer Mother 94       deceased 70   Cancer Father        Brain tumor; path?; deceased 81   Breast cancer Sister 11       bilateral breast ca @ 60; BRCA2 positive   Hypertension Sister    Hemochromatosis Sister    Thalassemia Sister    Other Sister        negative for BRCA1 BRCA2   Heart failure Sister        due to chemo?   CAD Son        MI   Diabetes Son    Leukemia Son    Lymphoma Maternal Aunt        deceased 44s   Breast cancer Cousin        BRCA2 positive   Diabetes Other    Breast cancer Other        distant maternal female relatives with breast cancer   Colon cancer Neg Hx    Colon polyps Neg Hx    Esophageal cancer Neg Hx    Rectal cancer Neg Hx    Stomach cancer Neg Hx     Her Social History Is Significant For: Social History   Socioeconomic History   Marital status: Married    Spouse name: [name]   Number of children: 2   Years of education: 16   Highest education level: Not on file  Occupational History   Occupation: Hospital doctor-- retired     Fish farm manager: RETIRED  Tobacco Use   Smoking status: Never    Smokeless tobacco: Never  Vaping Use   Vaping Use: Never used  Substance and Sexual Activity   Alcohol use: No    Alcohol/week: 0.0 standard drinks of alcohol   Drug use: No   Sexual activity: Not Currently    Birth control/protection: Surgical  Other Topics Concern   Not on file  Social History Narrative   HSG   1 year college for accounting. married '69. 2 sons - '70, '72: retired - [employer].     ACP - OK for CPR at least once, no long term ventilation, no heroic measures in the face of poor quality of life. HCPOA - husband, secondary son Masako Overall (c) [phone]   No Cafffeine (or very rare).        Social Determinants of Health   Financial Resource Strain: Low Risk  (10/01/2021)   Overall Financial Resource Strain (CARDIA)    Difficulty of Paying Living Expenses: Not hard at all  Food Insecurity: No Food Insecurity (10/01/2021)   Hunger Vital Sign    Worried About Running Out of Food in the Last Year: Never true    Ran Out of Food in the Last Year: Never true  Transportation Needs: No Transportation Needs (10/01/2021)   PRAPARE - Hydrologist (Medical): No    Lack of Transportation (Non-Medical): No  Physical Activity: Inactive (10/01/2021)   Exercise Vital Sign    Days of Exercise per Week: 0 days    Minutes of Exercise per Session: 0 min  Stress: No Stress Concern Present (10/01/2021)   [place]    Feeling of  Stress : Not at all  Social Connections: Moderately Isolated (10/01/2021)   Social Connection and Isolation Panel [NHANES]    Frequency of Communication with Friends and Family: More than three times a week    Frequency of Social Gatherings with Friends and Family: Never    Attends Religious Services: Never    Marine scientist or Organizations: No    Attends Music therapist: Never    Marital Status: Married    Her Allergies Are:  Allergies  Allergen  Reactions   Iodine Anaphylaxis   Shellfish Allergy Anaphylaxis   Sulfonamide Derivatives Rash   Cephalexin Other (See Comments)   Citrus Hives, Itching and Other (See Comments)    Itching in mouth/ blisters on lips   Lactose Intolerance (Gi) Diarrhea and Swelling    Swollen mouth   Amoxicillin Diarrhea    Did it involve swelling of the face/tongue/throat, SOB, or low BP? No Did it involve sudden or severe rash/hives, skin peeling, or any reaction on the inside of your mouth or nose? No Did you need to seek medical attention at a hospital or doctor's office? No When did it last happen?     35-77 years old  If all above answers are "NO", may proceed with cephalosporin use.  :   Her Current Medications Are:  Outpatient Encounter Medications as of 12/16/2021  Medication Sig   aspirin EC 81 MG tablet Take 81 mg by mouth daily.   atorvastatin (LIPITOR) 10 MG tablet TAKE 1 TABLET BY MOUTH EVERYDAY AT BEDTIME   Calcium Carbonate (CALCIUM 600 PO) Take 600 mg by mouth in the morning and at bedtime. (1600 & bedtime)   Cholecalciferol 50 MCG (2000 UT) CAPS Take 2,000 Units by mouth at bedtime.    diltiazem (CARDIZEM CD) 240 MG 24 hr capsule TAKE 1 CAPSULE BY MOUTH EVERY DAY   EPINEPHrine (EPI-PEN) 0.3 mg/0.3 mL DEVI Inject 0.3 mg into the muscle See admin instructions. Inject 0.3 mg intramuscularly in the leg as needed for severe allergic reaction, call 911 and then inject again in the other leg. Reported on 06/26/2015   febuxostat (ULORIC) 40 MG tablet Take 40 mg by mouth every evening.    ferrous fumarate (HEMOCYTE - 106 MG FE) 325 (106 Fe) MG TABS tablet Take 1 tablet (106 mg of iron total) by mouth 2 (two) times daily before a meal.   fluticasone (FLONASE) 50 MCG/ACT nasal spray Place 2 sprays into both nostrils in the morning.   fluticasone (FLOVENT HFA) 110 MCG/ACT inhaler Inhale 2 puffs into the lungs daily.   JARDIANCE 10 MG TABS tablet TAKE 1 TABLET DAILY BEFORE BREAKFAST   levalbuterol  (XOPENEX HFA) 45 MCG/ACT inhaler Inhale 2 puffs into the lungs every 4 (four) hours as needed for shortness of breath.   levocetirizine (XYZAL) 5 MG tablet Take 5 mg by mouth daily at 4 PM.   losartan (COZAAR) 25 MG tablet TAKE 1 TABLET (25 MG TOTAL) BY MOUTH DAILY.   metFORMIN (GLUCOPHAGE) 850 MG tablet TAKE 1.5 TABLETS (1,275 MG TOTAL) BY MOUTH 2 (TWO) TIMES DAILY WITH A MEAL.   metoprolol succinate (TOPROL-XL) 50 MG 24 hr tablet TAKE 1 TABLET BY MOUTH DAILY WITH OR IMMEDIATELY FOLLOWING A MEAL   nortriptyline (PAMELOR) 25 MG capsule Take 25 mg by mouth daily at 4 PM.   omeprazole (PRILOSEC) 40 MG capsule TAKE 1 CAPSULE BY MOUTH EVERY DAY   pioglitazone (ACTOS) 30 MG tablet TAKE 1 TABLET BY MOUTH EVERY DAY  vitamin E 400 UNIT capsule Take 400 Units by mouth daily at 4 PM.   No facility-administered encounter medications on file as of 12/16/2021.  :  Review of Systems:  Out of a complete 14 point review of systems, all are reviewed and negative with the exception of these symptoms as listed below:   Review of Systems  Neurological:        Pt  is here for  CPAP follow up Pt states you are issues with mask . Pt states she may need smaller mask   ESS:13    Objective:  Neurological Exam  Physical Exam Physical Examination:   Vitals:   12/16/21 1313  BP: (!) 111/56  Pulse: 83    General Examination: The patient is a very pleasant 76 y.o. female in no acute distress. She appears well-developed and well-nourished and well groomed.   HEENT: Normocephalic, atraumatic, pupils are equal, round and reactive to light, extraocular tracking is good without limitation to gaze excursion or nystagmus noted. Hearing is grossly intact. Face is symmetric with normal facial animation. Speech is clear with no dysarthria noted. There is no hypophonia. There is no lip, neck/head, jaw or voice tremor. Neck is supple with full range of passive and active motion. There are no carotid bruits on  auscultation. Oropharynx exam reveals: Mild to moderate mouth dryness, adequate dental hygiene and moderate airway crowding.  Tongue protrudes centrally and palate elevates symmetrically.   Chest: Clear to auscultation without wheezing, rhonchi or crackles noted.   Heart: S1+S2+0, regular and normal without murmurs, rubs or gallops noted.    Abdomen: Soft, non-tender and non-distended.   Extremities: There is puffiness both distal lower extremities.      Skin: Warm and dry without trophic changes noted.    Musculoskeletal: exam reveals no obvious joint deformities.    Neurologically:  Mental status: The patient is awake, alert and oriented in all 4 spheres. Her immediate and remote memory, attention, language skills and fund of knowledge are appropriate. There is no evidence of aphasia, agnosia, apraxia or anomia. Speech is clear with normal prosody and enunciation. Thought process is linear. Mood is normal and affect is normal.  Cranial nerves II - XII are as described above under HEENT exam.  Motor exam: Normal bulk, strength and tone is noted. There is no obvious tremor. Fine motor skills and coordination: grossly intact.  Cerebellar testing: No dysmetria or intention tremor. There is no truncal or gait ataxia.  Sensory exam: intact to light touch in the upper and lower extremities.  Gait, station and balance: She stands slowly and uses a cane.    Assessment and Plan:     In summary, Kristy [name] is a very pleasant 76 year old female with an underlying medical history of TIA, stroke, dizziness (followed previously by my colleague, Dr. Leonie Man), carotid artery stenosis, asthma, allergies, diabetes, degenerative joint disease, fibromyalgia, vertigo, reflux disease, gout, history of kidney stones, hypertension, hyperlipidemia, palpitations with history of PSVT, and obesity, who presents for follow-up consultation of her obstructive sleep apnea after interim testing and starting AutoPap  therapy.  She had a baseline sleep study on 09/21/2021 which showed a total AHI of 11.5/h.  She had lower average oxygen saturations was treated with supplemental oxygen at 1 L/min during her baseline sleep study, O2 nadir was 82%.  Her apnea scores are adequate while on treatment, we will proceed with a pulse oximetry test while she is on her usual AutoPap machine to make sure her  oxygen saturations are adequate while she is AutoPap therapy.  She is compliant with treatment and noticed improvement in her headaches and daytime energy.  She is commended for her treatment adherence.  We will call her with her pulse oximetry test results and plan a follow-up in sleep clinic in 1 year routinely.  She can schedule with one of our nurse practitioners.  We talked about her sleep study results and reviewed her compliance data in detail today.  I answered all their questions today and the patient and her husband were in agreement. I spent 30 minutes in total face-to-face time and in reviewing records during pre-charting, more than 50% of which was spent in counseling and coordination of care, reviewing test results, reviewing medications and treatment regimen and/or in discussing or reviewing the diagnosis of OSA, the prognosis and treatment options. Pertinent laboratory and imaging test results that were available during this visit with the patient were reviewed by me and considered in my medical decision making (see chart for details).

## 2021-12-17 NOTE — Progress Notes (Signed)
Fax confirmation received for ONO on APAP to [facility].

## 2021-12-22 ENCOUNTER — Other Ambulatory Visit: Payer: Self-pay | Admitting: Internal Medicine

## 2021-12-25 ENCOUNTER — Other Ambulatory Visit (INDEPENDENT_AMBULATORY_CARE_PROVIDER_SITE_OTHER): Payer: Medicare Other

## 2021-12-25 DIAGNOSIS — D649 Anemia, unspecified: Secondary | ICD-10-CM | POA: Diagnosis not present

## 2021-12-25 LAB — POC HEMOCCULT BLD/STL (HOME/3-CARD/SCREEN)
Card #2 Fecal Occult Blod, POC: NEGATIVE
Card #3 Fecal Occult Blood, POC: NEGATIVE
Fecal Occult Blood, POC: NEGATIVE

## 2021-12-30 DIAGNOSIS — G4733 Obstructive sleep apnea (adult) (pediatric): Secondary | ICD-10-CM | POA: Diagnosis not present

## 2021-12-31 DIAGNOSIS — J301 Allergic rhinitis due to pollen: Secondary | ICD-10-CM | POA: Diagnosis not present

## 2021-12-31 DIAGNOSIS — J3089 Other allergic rhinitis: Secondary | ICD-10-CM | POA: Diagnosis not present

## 2021-12-31 DIAGNOSIS — H1045 Other chronic allergic conjunctivitis: Secondary | ICD-10-CM | POA: Diagnosis not present

## 2021-12-31 DIAGNOSIS — J3081 Allergic rhinitis due to animal (cat) (dog) hair and dander: Secondary | ICD-10-CM | POA: Diagnosis not present

## 2022-01-05 ENCOUNTER — Telehealth: Payer: Self-pay | Admitting: Neurology

## 2022-01-05 DIAGNOSIS — G4734 Idiopathic sleep related nonobstructive alveolar hypoventilation: Secondary | ICD-10-CM

## 2022-01-05 DIAGNOSIS — G4733 Obstructive sleep apnea (adult) (pediatric): Secondary | ICD-10-CM

## 2022-01-05 NOTE — Telephone Encounter (Signed)
I received patient's overnight pulse oximetry report from test date 12/21/2021 through 12/22/2021.  Patient was on her PAP machine per conditions of testing and duration of test was 7 hours and 19 minutes.  Average oxygen saturation was 91.8%, nadir was 81%, time below or at 88% saturation was 16 minutes and 49 seconds.  She had just a handful of desaturations below 85%, several desaturations into the higher in mid 80s.  At this point would recommend that patient come in for a proper CPAP titration study.  She may qualify for supplemental oxygen but we would have to show this during the titration study.  Please advise patient of this, I would like to go ahead and request authorization for a CPAP titration study through her insurance.  Order placed in chart.  I will copy Meagan and Raquel Sarna on this.

## 2022-01-05 NOTE — Telephone Encounter (Signed)
Spoke with patient and discussed results from her overnight pulse oximetry test as noted below by Dr Rexene Alberts. Pt is amenable to proceeding with cpap titration study and she understands she may qualify for supplemental oxygen. Pt will await a call from the sleep lab for scheduling. She was advised to call if she hasn't heard in approx 2 weeks. Pt did not have any questions and verbalized appreciation for the call.

## 2022-01-06 ENCOUNTER — Telehealth: Payer: Self-pay | Admitting: Neurology

## 2022-01-06 NOTE — Telephone Encounter (Signed)
Patient returned my call she is scheduled at [facility] for 01/26/22 at 8 pm.  Mailed packet to the patient.

## 2022-01-06 NOTE — Telephone Encounter (Signed)
Left voicemail for patient to call me back to schedule sleep study.

## 2022-01-06 NOTE — Telephone Encounter (Signed)
LVM for pt to call back to schedule   Medicare/bcbs fed no auth req bc medicare is primary ref # [id number] on 01/06/22

## 2022-01-11 ENCOUNTER — Ambulatory Visit (INDEPENDENT_AMBULATORY_CARE_PROVIDER_SITE_OTHER): Payer: Medicare Other | Admitting: Neurology

## 2022-01-11 DIAGNOSIS — G4733 Obstructive sleep apnea (adult) (pediatric): Secondary | ICD-10-CM | POA: Diagnosis not present

## 2022-01-11 DIAGNOSIS — G4720 Circadian rhythm sleep disorder, unspecified type: Secondary | ICD-10-CM

## 2022-01-11 DIAGNOSIS — G4734 Idiopathic sleep related nonobstructive alveolar hypoventilation: Secondary | ICD-10-CM

## 2022-01-21 ENCOUNTER — Telehealth: Payer: Self-pay | Admitting: *Deleted

## 2022-01-21 NOTE — Addendum Note (Signed)
Addended by: Star Age on: 01/21/2022 09:17 AM   Modules accepted: Orders

## 2022-01-21 NOTE — Telephone Encounter (Addendum)
I called the pt & LVM (ok per DPR) advising patient of CPAP titration results. I advised that Dr Rexene Alberts stated pt did well on CPAP pressure of 16 cm. I advised the pt insurance requires a follow-up in 3 months. Asked for her to call us back and schedule. We will send order to [facility] to have her machine changed from autoPAP to CPAP 16 EPR 3. I made these changes in resmed and per Dr Rexene Alberts, I added a start pressure 7 with auto-ramp. Also faxed orders to [facility]. Received a receipt of confirmation.

## 2022-01-21 NOTE — Procedures (Signed)
[facility] PAP TITRATION INTERPRETATION REPORT   STUDY DATE: 01/11/2022      PATIENT NAME:  Kristy, [name]         DATE OF BIRTH:  10/10/1945  PATIENT ID:  [id number]    TYPE OF STUDY:  CPAP  READING PHYSICIAN: Star Age, MD, PhD SCORING TECHNICIAN: Vita Barley Fields   INDICATIONS:  76 year old right-handed woman with an underlying medical history of TIA, stroke, dizziness, carotid artery stenosis, asthma, allergies, diabetes, degenerative joint disease, fibromyalgia, vertigo, reflux disease, gout, history of kidney stones, hypertension, hyperlipidemia, palpitations with history of PSVT, and obesity, who presents for a full night titration study to treat her obstructive sleep apnea.  She has had residual oxygen desaturations while on AutoPap therapy. The Epworth Sleepiness Scale was 13 out of 24 (scores above or equal to 10 are suggestive of hypersomnolence). ADDITIONAL INFORMATION:  Height: 65.0 in Weight: 222 lb (BMI 36) Neck Size: 15.0 in Medications: Aspirin, Lipitor, Calcium, Cholecalciferol, Cardizem, Epinephrine, Uloric,Hemocyte, Flonase, Flovent HFA, Jardiance, Xopenex HFA, Xyzal, Cozaar, Glucophage, Toprol, Pamelor, Prilosec, Actos, Vitamin E   DESCRIPTION: A sleep technologist was in attendance for the duration of the recording.  Data collection, scoring, video monitoring, and reporting were performed in compliance with the AASM Manual for the Scoring of Sleep and Associated Events; (Hypopnea is scored based on the criteria listed in Section VIII D. 1b in the AASM Manual V2.6 using a 4% oxygen desaturation rule or Hypopnea is scored based on the criteria listed in Section VIII D. 1a in the AASM Manual V2.6 using 3% oxygen desaturation and /or arousal rule).  A physician certified by the American Board of Sleep Medicine reviewed each epoch of the study.  FINDINGS:  Please refer to the attached summary for additional quantitative information. Titration  summary: The patient was fitted with a small F20 fullface mask from [employer].  She was started on CPAP of 7 cm with EPR of 2 and CPAP was gradually increased.  She was titrated to a pressure of 13 cm with EPR of 3.  She was also briefly tried on BiPAP of 15 over 11 cm but had increase in central respiratory events while on standard BiPAP therapy.  She was further titrated on CPAP up to a final pressure of 16 cm with EPR of 3.  Supplemental oxygen was tried briefly but not maintained throughout the night.  Her AHI was 3.04 on a pressure of 16 cm, total sleep time of 39.5 minutes, REM sleep was achieved in the nonsupine position.  O2 nadir 90%.  Supplemental oxygen at 1 L/min was briefly added while the patient was on 9 cm of CPAP pressure and maintained it for about 30 minutes but discontinued as she continued to have respiratory events with desaturations and needed increase in treatment pressure.  SLEEP CONTINUITY AND SLEEP ARCHITECTURE:  Lights out was at 20:58: and lights on 05:00: (8.0 hours in bed). Total sleep time was 328.0 minutes (29.3% supine; 13.6% REM sleep), with a decreased sleep efficiency at 68.0%. Sleep latency was increased at 86.0 minutes.  REM sleep latency was markedly increased at 326.0 minutes. Of the total sleep time, the percentage of stage N1 sleep was 6.6%, stage N2 sleep was 49.2%, stage N3 sleep was 30.6%, which is increased, and REM sleep was 13.6%, which is reduced. There were 2 Stage R periods observed on this study night, 35 awakenings (i.e. transitions to Stage W from any sleep stage), and 117.0 total stage transitions. Wake after  sleep onset (WASO) time accounted for 68 minutes with moderate sleep fragmentation noted.  AROUSAL: There were 68 arousals in total, for an arousal index of 12.4 arousals/hour.  Of these, 30 were identified as respiratory-related arousals (5.5 /hr), 0 were PLM-related arousals (0.0 /hr), and 61 were non-specific arousals (11.2 /hr)  RESPIRATORY  MONITORING:  Based on CMS criteria (using a 4% oxygen desaturation rule for scoring hypopneas), there were 15 apneas (6 obstructive; 9 central; 0 mixed), and 65 hypopneas.  Apnea index was 2.7. Hypopnea index was 11.9. The apnea-hypopnea index was 14.6 overall (37.5 supine, 4.0 non-supine; 4.0 REM, 0.0 supine REM). There were 0 respiratory effort-related arousals (RERAs).  The RERA index was 0.0 events/hr. Total respiratory disturbance index (RDI) was 14.6 events/hr. RDI results showed: supine RDI  37.5 /hr; non-supine RDI 5.2 /hr; REM RDI 4.0 /hr, supine REM RDI 0.0 /hr.   Based on AASM criteria (using a 3% oxygen desaturation and /or arousal rule for scoring hypopneas), there were 15 apneas (6 obstructive; 9 central; 0 mixed), and 69 hypopneas. Apnea index was 2.7. Hypopnea index was 12.6. The apnea-hypopnea index was 15.4 overall (38.8 supine, 4.0 non-supine; 4.0 REM, 0.0 supine REM). There were 0 respiratory effort-related arousals (RERAs).  The RERA index was 0.0 events/hr. Total respiratory disturbance index (RDI) was 15.4 events/hr. RDI results showed: supine RDI  38.8 /hr; non-supine RDI 5.7 /hr; REM RDI 4.0 /hr, supine REM RDI 0.0 /hr.  Respiratory events were associated with oxyhemoglobin desaturations (nadir during sleep 85%) from a mean of 91%). There were 0 occurrences of Cheyne Stokes breathing. LIMB MOVEMENTS: There were 0 periodic limb movements of sleep (0.0/hr), of which 0 (0.0/hr) were associated with an arousal. OXIMETRY: Total sleep time spent at, or below 88% was 48.1 minutes, or 14.7% of total sleep time. Snoring was classified as improved on CPAP. BODY POSITION: Duration of total sleep and percent of total sleep in their respective position is as follows: supine 96 minutes (29.3%), non-supine 232.0 minutes (70.7%); right 00 minutes (0.0%), left 232 minutes (70.7%), and prone 00 minutes (0.0%). Total supine REM sleep time was 00 minutes (0.0% of total REM sleep).  EEG: With the limited  montage recorded, no EEG abnormalities were observed.   CARDIAC: The electrocardiogram documented normal sinus rhythm.  The average heart rate during sleep was 70 bpm.  The maximum heart rate during sleep was 89 bpm. The maximum heart rate during recording was 89.    BEHAVIORAL: No significant parasomnia behavior noted.  Post study, the patient indicated that sleep was better than usual.   IMPRESSION and DIAGNOSES:   1. Obstructive sleep apnea 2. Dysfunctions associated with sleep stages or arousal from sleep   RECOMMENDATIONS:   1. This study demonstrates significant improvement of the patient's sleep disordered breathing with CPAP therapy, also improvement of her desaturations. She required higher pressures, but did not require supplemental oxygen. I will recommend switching her from AutoPap therapy to a home CPAP of 16 cm with EPR of 3, utilizing a small F20 fullface mask from [employer]. The patient will be advised to be fully compliant with PAP therapy to improve sleep related symptoms and decrease long term cardiovascular risks. The patient should be reminded, that it may take up to 3 months to get fully used to using PAP with all planned sleep. The earlier full compliance is achieved, the better long term compliance tends to be. Please note that untreated obstructive sleep apnea may carry additional perioperative morbidity. Patients with significant obstructive  sleep apnea should receive perioperative PAP therapy and the surgeons and particularly the anesthesiologist should be informed of the diagnosis and the severity of the sleep disordered breathing. 2. This study shows sleep fragmentation and abnormal sleep stage percentages; these are nonspecific findings and per se do not signify an intrinsic sleep disorder or a cause for the patient's sleep-related symptoms. Causes include (but are not limited to) the first night effect of the sleep study, circadian rhythm disturbances, medication effect or  an underlying mood disorder or medical problem.  3. The patient should be cautioned not to drive, work at heights, or operate dangerous or heavy equipment when tired or sleepy. Review and reiteration of good sleep hygiene measures should be pursued with any patient.  4. The patient will be seen in follow-up in the sleep clinic at [facility] for discussion of the test results, symptom and treatment compliance review, further management strategies, etc. The referring provider will be notified of the test results. I certify that I have reviewed the entire raw data recording prior to the issuance of this report in accordance with the Standards of Accreditation of the American Academy of Sleep Medicine (AASM).  ? Star Age,  MD, PhD

## 2022-01-21 NOTE — Telephone Encounter (Signed)
-----   Message from Star Age, MD sent at 01/21/2022  9:17 AM EDT ----- This patient had a CPAP titration study as she did have some oxygen desaturations on AutoPap therapy.  These notify patient that her CPAP titration study showed good results with CPAP of 16 cm.  I would like to switch her from AutoPap to CPAP of 16 cm, we will send the order to her current DME company.  She was tried on supplemental oxygen earlier on in the study but did not require supplement oxygen later on.  Please arrange for follow-up appointment with the nurse practitioner about 3 months after starting CPAP therapy and encourage full compliance.

## 2022-01-24 ENCOUNTER — Other Ambulatory Visit: Payer: Self-pay | Admitting: Internal Medicine

## 2022-02-16 ENCOUNTER — Telehealth: Payer: Self-pay | Admitting: Neurology

## 2022-02-16 DIAGNOSIS — Z9989 Dependence on other enabling machines and devices: Secondary | ICD-10-CM

## 2022-02-16 NOTE — Telephone Encounter (Signed)
Pt called to schedule f/u appt for her cpap. Np had nothing available mid or end of Nov therefore she was scheduled with MD. Pt also wanted to report that the air pressure is to high to the point that the mask does not seal well. Pt would like for the pressure to be brought down.

## 2022-02-16 NOTE — Telephone Encounter (Signed)
We can try to reduce her CPAP set pressure to 14 cm from 16 cm for better tolerance.  I placed the order in the chart.

## 2022-02-17 NOTE — Telephone Encounter (Signed)
I called the pt. She is amenable to a pressure change from 16 to 14 cm. I told her order would be faxed to [facility] and if she hasn't noticed a change in a few days to call us back. She verbalized understanding and appreciation.   Order faxed to [facility]. Received a receipt of confirmation.

## 2022-02-23 ENCOUNTER — Other Ambulatory Visit: Payer: Self-pay | Admitting: Obstetrics & Gynecology

## 2022-02-23 ENCOUNTER — Encounter: Payer: Self-pay | Admitting: Gastroenterology

## 2022-02-23 DIAGNOSIS — Z1231 Encounter for screening mammogram for malignant neoplasm of breast: Secondary | ICD-10-CM

## 2022-02-25 DIAGNOSIS — R42 Dizziness and giddiness: Secondary | ICD-10-CM | POA: Diagnosis not present

## 2022-02-25 DIAGNOSIS — H838X3 Other specified diseases of inner ear, bilateral: Secondary | ICD-10-CM | POA: Diagnosis not present

## 2022-02-25 DIAGNOSIS — H8101 Meniere's disease, right ear: Secondary | ICD-10-CM | POA: Diagnosis not present

## 2022-02-25 DIAGNOSIS — H903 Sensorineural hearing loss, bilateral: Secondary | ICD-10-CM | POA: Diagnosis not present

## 2022-02-26 ENCOUNTER — Telehealth: Payer: Self-pay | Admitting: Neurology

## 2022-02-26 NOTE — Telephone Encounter (Signed)
Informed pt of time change for 11/22 appt- office closing early.

## 2022-03-24 ENCOUNTER — Ambulatory Visit: Payer: Medicare Other | Attending: Cardiology | Admitting: Cardiology

## 2022-03-24 ENCOUNTER — Ambulatory Visit (INDEPENDENT_AMBULATORY_CARE_PROVIDER_SITE_OTHER): Payer: Medicare Other

## 2022-03-24 ENCOUNTER — Encounter: Payer: Self-pay | Admitting: Cardiology

## 2022-03-24 VITALS — BP 110/72 | HR 80 | Ht 65.0 in | Wt 219.6 lb

## 2022-03-24 DIAGNOSIS — R002 Palpitations: Secondary | ICD-10-CM | POA: Diagnosis not present

## 2022-03-24 DIAGNOSIS — I471 Supraventricular tachycardia, unspecified: Secondary | ICD-10-CM | POA: Diagnosis not present

## 2022-03-24 DIAGNOSIS — I10 Essential (primary) hypertension: Secondary | ICD-10-CM | POA: Diagnosis not present

## 2022-03-24 DIAGNOSIS — E785 Hyperlipidemia, unspecified: Secondary | ICD-10-CM | POA: Insufficient documentation

## 2022-03-24 DIAGNOSIS — R0600 Dyspnea, unspecified: Secondary | ICD-10-CM | POA: Diagnosis not present

## 2022-03-24 NOTE — Patient Instructions (Signed)
Medication Instructions:  Your physician recommends that you continue on your current medications as directed. Please refer to the Current Medication list given to you today.  *If you need a refill on your cardiac medications before your next appointment, please call your pharmacy*  Testing/Procedures: [place] Monitor Instructions   Your physician has requested you wear a ZIO patch monitor for _14_ days.  This is a single patch monitor.   [employer] supplies one patch monitor per enrollment. Additional stickers are not available. Please do not apply patch if you will be having a Nuclear Stress Test, Echocardiogram, Cardiac CT, MRI, or Chest Xray during the period you would be wearing the monitor. The patch cannot be worn during these tests. You cannot remove and re-apply the ZIO XT patch monitor.  Your ZIO patch monitor will be sent Fed Ex from [employer] directly to your home address. It may take 3-5 days to receive your monitor after you have been enrolled.  Once you have received your monitor, please review the enclosed instructions. Your monitor has already been registered assigning a specific monitor serial # to you.  Billing and Patient Assistance Program Information   We have supplied [employer] with any of your insurance information on file for billing purposes. [employer] offers a sliding scale Patient Assistance Program for patients that do not have insurance, or whose insurance does not completely cover the cost of the ZIO monitor.   You must apply for the Patient Assistance Program to qualify for this discounted rate.     To apply, please call [employer] at [phone], select option 4, then select option 2, and ask to apply for Patient Assistance Program.  Theodore Demark will ask your household income, and how many people are in your household.  They will quote your out-of-pocket cost based on that information.  [employer] will also be able to set up a 54-month interest-free payment plan  if needed.  Applying the monitor   Shave hair from upper left chest.  Hold abrader disc by orange tab. Rub abrader in 40 strokes over the upper left chest as indicated in your monitor instructions.  Clean area with 4 enclosed alcohol pads. Let dry.  Apply patch as indicated in monitor instructions. Patch will be placed under collarbone on left side of chest with arrow pointing upward.  Rub patch adhesive wings for 2 minutes. Remove white label marked "1". Remove the white label marked "2". Rub patch adhesive wings for 2 additional minutes.  While looking in a mirror, press and release button in center of patch. A small green light will flash 3-4 times. This will be your only indicator that the monitor has been turned on. ?  Do not shower for the first 24 hours. You may shower after the first 24 hours.  Press the button if you feel a symptom. You will hear a small click. Record Date, Time and Symptom in the Patient Logbook.  When you are ready to remove the patch, follow instructions on the last 2 pages of the Patient Logbook. Stick patch monitor onto the last page of Patient Logbook.  Place Patient Logbook in the blue and white box.  Use locking tab on box and tape box closed securely.  The blue and white box has prepaid postage on it. Please place it in the mailbox as soon as possible. Your physician should have your test results approximately 7 days after the monitor has been [facility]  Call [facility]  at [phone] if you have questions regarding your ZIO XT patch monitor. Call them immediately if you see an orange light blinking on your monitor.  If your monitor falls off in less than 4 days, contact our Monitor department at [phone]. ?If your monitor becomes loose or falls off after 4 days call [employer] at [phone] for suggestions on securing your monitor.?  Follow-Up: At [facility], you and your health needs are our priority.  As  part of our continuing mission to provide you with exceptional heart care, we have created designated Provider Care Teams.  These Care Teams include your primary Cardiologist (physician) and Advanced Practice Providers (APPs -  Physician Assistants and Nurse Practitioners) who all work together to provide you with the care you need, when you need it.  We recommend signing up for the patient portal called "MyChart".  Sign up information is provided on this After Visit Summary.  MyChart is used to connect with patients for Virtual Visits (Telemedicine).  Patients are able to view lab/test results, encounter notes, upcoming appointments, etc.  Non-urgent messages can be sent to your provider as well.   To learn more about what you can do with MyChart, go to [email or web address].    Your next appointment:   12 month(s)  The format for your next appointment:   In Person  Provider:   Donato Heinz, MD

## 2022-03-24 NOTE — Progress Notes (Signed)
Cardiology Office Note:    Date:  03/24/2022   ID:  Flordia, Kassem [birth year], MRN [id number]  PCP:  Colon Branch, MD  Cardiologist:  Donato Heinz, MD  Electrophysiologist:  None   Referring MD: Colon Branch, MD   No chief complaint on file.   History of Present Illness:    Kristy [name] is a 76 y.o. female with a hx of TIA, fibromyalgia, SVT, OSA who presents for follow-up.  She was referred by Dr.Paz for preop evaluation prior to spinal surgery, initially seen on 07/18/2020.  She previously followed with Dr. Gwenlyn Found, last seen in 2017.  Had been followed for SVT. Echocardiogram in 2014 showed no significant abnormalities.  At initial clinic visit, reported dyspnea with minimal exertion.  Lexiscan Myoview was done on 07/22/2020, which showed fixed basal to mid anteroseptal defect likely representing artifact versus infarct, EF 49%.  Echocardiogram on 07/23/2020 showed LVEF 60 to 91%, grade 1 diastolic dysfunction, normal RV function, no significant valvular disease.  Since last clinic visit, she reports she is doing OK.  Continues to report some shortness of breath but has been walking more and denies any exertional dyspnea.  Denies any chest pain.  Has been having lightheadedness which she attributes to her Mnire's disease, denies any syncope.  Does report some lower extremity edema which has been on her feet for a while.  Having episodes of palpitations recently, can last up to 15 to 20 minutes.   Past Medical History:  Diagnosis Date   Allergy    Environmental, foods: shellfish, lactose intolerant, citrus, peanuts   Asthma    mild persistent   Atrophic vaginitis    BRCA gene mutation negative 2015   testing done at [place]   Diabetes mellitus without complication (HCC)    Type 2   DJD (degenerative joint disease)    generalized joint pain, including back   Dysrhythmia    SVT   Family history of malignant neoplasm of breast    Fibromyalgia    Dr Amil Amen    GERD (gastroesophageal reflux disease)    Gout    Idiopathic, chronic   Hiatal hernia    History of kidney stones    Hyperlipidemia    Hypertension    Meniere's disease    R side worse, L side starting, see's Dr. Benjamine Mola.   Palpitations    PSVT on Holter monitoring   Shortness of breath    occ   Stroke [facility])    TIA  2014   SUI (stress urinary incontinence, female)    (resolved after surgery)   TIA (transient ischemic attack)    07/2012, see Dr Leonie Man   Vertigo     Past Surgical History:  Procedure Laterality Date   ABDOMINAL HYSTERECTOMY  1994   TAH,BSO-BLADDER NECK SUSPENSION   BREAST SURGERY     LUMPECTOMY-BENIGN Breast Bx X2   CARPAL TUNNEL RELEASE Right 2005   CATARACT EXTRACTION, BILATERAL Bilateral 2016   Cataract    CHOLECYSTECTOMY, LAPAROSCOPIC  2001   CYSTOCELE REPAIR  1994   EYE SURGERY     JOINT REPLACEMENT     bilatral knees;    KNEE ARTHROPLASTY  05/19/2012   Procedure: COMPUTER ASSISTED TOTAL KNEE ARTHROPLASTY;  Surgeon: Marybelle Killings, MD;  Location: [place];  Service: Orthopedics;  Laterality: Left;  Left  Total Knee Arthroplasty   KNEE ARTHROPLASTY Right 09/03/2013   Procedure: COMPUTER ASSISTED TOTAL KNEE ARTHROPLASTY;  Surgeon: Marybelle Killings, MD;  Location: [place];  Service: Orthopedics;  Laterality: Right;  Right Total Knee Arthroplasty, Computer Assist   KNEE ARTHROSCOPY     bil   LUMBAR LAMINECTOMY/DECOMPRESSION MICRODISCECTOMY N/A 08/18/2020   Procedure: L4-5 decompression, possible left L4-5 microdiscectomy;  Surgeon: Marybelle Killings, MD;  Location: [place];  Service: Orthopedics;  Laterality: N/A;   OOPHORECTOMY     BSO   SURGERY FOR POSERIOR TIBIAL TENDON  2003   TONSILLECTOMY     TRIPLE FUSION LEFT ANKLE Left 2007   TUBAL LIGATION      Current Medications: Current Meds  Medication Sig   aspirin EC 81 MG tablet Take 81 mg by mouth daily.   atorvastatin (LIPITOR) 10 MG tablet TAKE 1 TABLET BY MOUTH EVERYDAY AT BEDTIME   Calcium Carbonate (CALCIUM 600  PO) Take 600 mg by mouth in the morning and at bedtime. (1600 & bedtime)   Cholecalciferol 50 MCG (2000 UT) CAPS Take 2,000 Units by mouth at bedtime.    diltiazem (CARDIZEM CD) 240 MG 24 hr capsule TAKE 1 CAPSULE BY MOUTH EVERY DAY   EPINEPHrine (EPI-PEN) 0.3 mg/0.3 mL DEVI Inject 0.3 mg into the muscle See admin instructions. Inject 0.3 mg intramuscularly in the leg as needed for severe allergic reaction, call 911 and then inject again in the other leg. Reported on 06/26/2015   febuxostat (ULORIC) 40 MG tablet Take 40 mg by mouth every evening.    ferrous fumarate (HEMOCYTE - 106 MG FE) 325 (106 Fe) MG TABS tablet Take 1 tablet (106 mg of iron total) by mouth 2 (two) times daily before a meal.   fluticasone (FLONASE) 50 MCG/ACT nasal spray Place 2 sprays into both nostrils in the morning.   fluticasone (FLOVENT HFA) 110 MCG/ACT inhaler Inhale 2 puffs into the lungs daily.   ipratropium (ATROVENT) 0.03 % nasal spray Place into the nose 3 (three) times daily as needed.   JARDIANCE 10 MG TABS tablet TAKE 1 TABLET DAILY BEFORE BREAKFAST   levocetirizine (XYZAL) 5 MG tablet Take 5 mg by mouth daily at 4 PM.   losartan (COZAAR) 25 MG tablet TAKE 1 TABLET (25 MG TOTAL) BY MOUTH DAILY.   metFORMIN (GLUCOPHAGE) 850 MG tablet TAKE 1.5 TABLETS (1,275 MG TOTAL) BY MOUTH 2 (TWO) TIMES DAILY WITH A MEAL.   metoprolol succinate (TOPROL-XL) 50 MG 24 hr tablet TAKE 1 TABLET BY MOUTH DAILY WITH OR IMMEDIATELY FOLLOWING A MEAL   nortriptyline (PAMELOR) 25 MG capsule Take 25 mg by mouth daily at 4 PM.   omeprazole (PRILOSEC) 40 MG capsule TAKE 1 CAPSULE BY MOUTH EVERY DAY   pioglitazone (ACTOS) 30 MG tablet TAKE 1 TABLET BY MOUTH EVERY DAY   vitamin E 400 UNIT capsule Take 400 Units by mouth daily at 4 PM.     Allergies:   Iodine, Shellfish allergy, Sulfonamide derivatives, Cephalexin, Citrus, Lactose intolerance (gi), and Amoxicillin   Social History   Socioeconomic History   Marital status: Married     Spouse name: [name]   Number of children: 2   Years of education: 16   Highest education level: Not on file  Occupational History   Occupation: Hospital doctor-- retired     Fish farm manager: RETIRED  Tobacco Use   Smoking status: Never   Smokeless tobacco: Never  Vaping Use   Vaping Use: Never used  Substance and Sexual Activity   Alcohol use: No    Alcohol/week: 0.0 standard drinks of alcohol   Drug use: No   Sexual activity: Not  Currently    Birth control/protection: Surgical  Other Topics Concern   Not on file  Social History Narrative   HSG   1 year college for accounting. married '69. 2 sons - '70, '72: retired - [employer].     ACP - OK for CPR at least once, no long term ventilation, no heroic measures in the face of poor quality of life. HCPOA - husband, secondary son Nevaeha Finerty (c) [phone]   No Cafffeine (or very rare).        Social Determinants of Health   Financial Resource Strain: Low Risk  (10/01/2021)   Overall Financial Resource Strain (CARDIA)    Difficulty of Paying Living Expenses: Not hard at all  Food Insecurity: No Food Insecurity (10/01/2021)   Hunger Vital Sign    Worried About Running Out of Food in the Last Year: Never true    Ran Out of Food in the Last Year: Never true  Transportation Needs: No Transportation Needs (10/01/2021)   PRAPARE - Hydrologist (Medical): No    Lack of Transportation (Non-Medical): No  Physical Activity: Inactive (10/01/2021)   Exercise Vital Sign    Days of Exercise per Week: 0 days    Minutes of Exercise per Session: 0 min  Stress: No Stress Concern Present (10/01/2021)   [place]    Feeling of Stress : Not at all  Social Connections: Moderately Isolated (10/01/2021)   Social Connection and Isolation Panel [NHANES]    Frequency of Communication with Friends and Family: More than three times a week    Frequency of Social Gatherings with  Friends and Family: Never    Attends Religious Services: Never    Printmaker: No    Attends Music therapist: Never    Marital Status: Married     Family History: The patient's family history includes Breast cancer in her cousin and another family member; Breast cancer (age of onset: 46) in her mother; Breast cancer (age of onset: 18) in her sister; CAD in her son; Cancer in her father; Diabetes in her son and another family member; Heart failure in her sister; Hemochromatosis in her sister; Hypertension in her sister; Leukemia in her son; Lymphoma in her maternal aunt; Other in her sister; Thalassemia in her sister. There is no history of Colon cancer, Colon polyps, Esophageal cancer, Rectal cancer, or Stomach cancer.  ROS:   Please see the history of present illness.     All other systems reviewed and are negative.  EKGs/Labs/Other Studies Reviewed:    The following studies were reviewed today:   EKG:  03/24/2022: Normal sinus rhythm, rate 80, no ST abnormalities 07/18/2020- The ekg ordered demonstrates normal sinus rhythm with sinus arrhythmia, rate 75, no ST abnormalities  Recent Labs: 07/15/2021: ALT 15; BUN 18; Creatinine, Ser 0.99; Potassium 4.3; Sodium 139; TSH 4.03 11/26/2021: Hemoglobin 10.4; Platelets 322.0  Recent Lipid Panel    Component Value Date/Time   CHOL 115 11/26/2021 1104   TRIG 217.0 (H) 11/26/2021 1104   HDL 41.10 11/26/2021 1104   CHOLHDL 3 11/26/2021 1104   VLDL 43.4 (H) 11/26/2021 1104   LDLCALC 85 07/15/2012 0500   LDLDIRECT 47.0 11/26/2021 1104    Physical Exam:    VS:  BP 110/72 (BP Location: Left Arm, Patient Position: Sitting, Cuff Size: Large)   Pulse 80   Ht _0  (1.651 m)   Wt 219  lb 9.6 oz (99.6 kg)   SpO2 94%   BMI 36.54 kg/m     Wt Readings from Last 3 Encounters:  03/24/22 219 lb 9.6 oz (99.6 kg)  12/16/21 222 lb (100.7 kg)  12/09/21 217 lb (98.4 kg)     GEN:  in no acute  distress HEENT: Normal NECK: No JVD; No carotid bruits CARDIAC: RRR, no murmurs, rubs, gallops RESPIRATORY:  Clear to auscultation without rales, wheezing or rhonchi  ABDOMEN: Soft, non-tender, non-distended MUSCULOSKELETAL:  No edema; No deformity  SKIN: Warm and dry NEUROLOGIC:  Alert and oriented x 3 PSYCHIATRIC:  Normal affect   ASSESSMENT:    1. Palpitations   2. SVT (supraventricular tachycardia)   3. Dyspnea, unspecified type   4. Essential hypertension   5. Hyperlipidemia, unspecified hyperlipidemia type     PLAN:    Dyspnea: At initial clinic visit, reported dyspnea with minimal exertion.  Lexiscan Myoview was done on 07/22/2020, which showed fixed basal to mid anteroseptal defect likely representing artifact versus infarct, EF 49%.  Echocardiogram on 07/23/2020 showed LVEF 60 to 40%, grade 1 diastolic dysfunction, normal RV function, no significant valvular disease. -Given echo findings, suspect abnormality on Myoview represents artifact and no further cardiac work-up recommended  SVT: On diltiazem 240 mg daily and Toprol-XL 50 mg daily.  Reports recent palpitations, will check Zio patch x 2 weeks.  Hyperlipidemia: On atorvastatin 10 mg daily.  LDL 47 on 11/26/21  Hypertension: On losartan 25 mg daily, diltiazem 240 mg daily, and Toprol-XL 50 mg daily.  Normotensive in clinic today but reports occasional low BP.  Asked to check BP twice daily for next week and call with results  OSA: reports compliance with CPAP  RTC in 1 year   Medication Adjustments/Labs and Tests Ordered: Current medicines are reviewed at length with the patient today.  Concerns regarding medicines are outlined above.  Orders Placed This Encounter  Procedures   LONG TERM MONITOR (3-14 DAYS)   EKG 12-Lead   No orders of the defined types were placed in this encounter.   Patient Instructions  Medication Instructions:  Your physician recommends that you continue on your current medications as  directed. Please refer to the Current Medication list given to you today.  *If you need a refill on your cardiac medications before your next appointment, please call your pharmacy*  Testing/Procedures: [place] Monitor Instructions   Your physician has requested you wear a ZIO patch monitor for _14_ days.  This is a single patch monitor.   [employer] supplies one patch monitor per enrollment. Additional stickers are not available. Please do not apply patch if you will be having a Nuclear Stress Test, Echocardiogram, Cardiac CT, MRI, or Chest Xray during the period you would be wearing the monitor. The patch cannot be worn during these tests. You cannot remove and re-apply the ZIO XT patch monitor.  Your ZIO patch monitor will be sent Fed Ex from [employer] directly to your home address. It may take 3-5 days to receive your monitor after you have been enrolled.  Once you have received your monitor, please review the enclosed instructions. Your monitor has already been registered assigning a specific monitor serial # to you.  Billing and Patient Assistance Program Information   We have supplied [employer] with any of your insurance information on file for billing purposes. [employer] offers a sliding scale Patient Assistance Program for patients that do not have insurance, or whose insurance does not completely cover  the cost of the ZIO monitor.   You must apply for the Patient Assistance Program to qualify for this discounted rate.     To apply, please call [employer] at [phone], select option 4, then select option 2, and ask to apply for Patient Assistance Program.  Theodore Demark will ask your household income, and how many people are in your household.  They will quote your out-of-pocket cost based on that information.  [employer] will also be able to set up a 49-month interest-free payment plan if needed.  Applying the monitor   Shave hair from upper left chest.  Hold abrader disc by  orange tab. Rub abrader in 40 strokes over the upper left chest as indicated in your monitor instructions.  Clean area with 4 enclosed alcohol pads. Let dry.  Apply patch as indicated in monitor instructions. Patch will be placed under collarbone on left side of chest with arrow pointing upward.  Rub patch adhesive wings for 2 minutes. Remove white label marked "1". Remove the white label marked "2". Rub patch adhesive wings for 2 additional minutes.  While looking in a mirror, press and release button in center of patch. A small green light will flash 3-4 times. This will be your only indicator that the monitor has been turned on. ?  Do not shower for the first 24 hours. You may shower after the first 24 hours.  Press the button if you feel a symptom. You will hear a small click. Record Date, Time and Symptom in the Patient Logbook.  When you are ready to remove the patch, follow instructions on the last 2 pages of the Patient Logbook. Stick patch monitor onto the last page of Patient Logbook.  Place Patient Logbook in the blue and white box.  Use locking tab on box and tape box closed securely.  The blue and white box has prepaid postage on it. Please place it in the mailbox as soon as possible. Your physician should have your test results approximately 7 days after [facility]  Call [place]at [phone] you have questions regarding your ZIO XT patch monitor. Call them immediately if you see an orange light blinking on your monitor.  If your monitor falls off in less than 4 days, contact our Monitor department at [phone] ?If your monitor becomes loose or falls off after 4 days call [employer] at [phone] suggestions on securing your monitor.?  Follow-Up: [facility] you and your health needs are our priority.  As part of our continuing mission to provide you with exceptional heart care, we have created  designated Provider Care Teams.  These Care Teams include your primary Cardiologist (physician) and Advanced Practice Providers (APPs -  Physician Assistants and Nurse Practitioners) who all work together to provide you with the care you need, when you need it.  We recommend signing up for the patient portal called "MyChart".  Sign up information is provided on this After Visit Summary.  MyChart is used to connect with patients for Virtual Visits (Telemedicine).  Patients are able to view lab/test results, encounter notes, upcoming appointments, etc.  Non-urgent messages can be sent to your provider as well.   To learn more about what you can do with MyChart, go to [email or web address]    Your next appointment:   12 month(s)  The format for your next appointment:   In Person  Provider:   CDonato Heinz MD  Signed, Donato Heinz, MD  03/24/2022 5:15 PM    [facility]

## 2022-03-24 NOTE — Progress Notes (Unsigned)
Enrolled for [employer] to mail a ZIO XT long term holter monitor to the patients address on file.  

## 2022-03-28 DIAGNOSIS — R002 Palpitations: Secondary | ICD-10-CM | POA: Diagnosis not present

## 2022-03-29 ENCOUNTER — Encounter: Payer: Self-pay | Admitting: Internal Medicine

## 2022-03-29 ENCOUNTER — Ambulatory Visit (INDEPENDENT_AMBULATORY_CARE_PROVIDER_SITE_OTHER): Payer: Medicare Other | Admitting: Internal Medicine

## 2022-03-29 VITALS — BP 122/78 | HR 88 | Temp 97.8°F | Resp 18 | Ht 65.0 in | Wt 222.0 lb

## 2022-03-29 DIAGNOSIS — R269 Unspecified abnormalities of gait and mobility: Secondary | ICD-10-CM | POA: Diagnosis not present

## 2022-03-29 DIAGNOSIS — E038 Other specified hypothyroidism: Secondary | ICD-10-CM

## 2022-03-29 DIAGNOSIS — E1169 Type 2 diabetes mellitus with other specified complication: Secondary | ICD-10-CM | POA: Diagnosis not present

## 2022-03-29 DIAGNOSIS — D509 Iron deficiency anemia, unspecified: Secondary | ICD-10-CM | POA: Diagnosis not present

## 2022-03-29 DIAGNOSIS — I10 Essential (primary) hypertension: Secondary | ICD-10-CM

## 2022-03-29 LAB — COMPREHENSIVE METABOLIC PANEL
ALT: 22 U/L (ref 0–35)
AST: 18 U/L (ref 0–37)
Albumin: 4.3 g/dL (ref 3.5–5.2)
Alkaline Phosphatase: 64 U/L (ref 39–117)
BUN: 16 mg/dL (ref 6–23)
CO2: 23 mEq/L (ref 19–32)
Calcium: 9.6 mg/dL (ref 8.4–10.5)
Chloride: 103 mEq/L (ref 96–112)
Creatinine, Ser: 0.87 mg/dL (ref 0.40–1.20)
GFR: 64.29 mL/min (ref >60.00)
Glucose, Bld: 116 mg/dL — ABNORMAL HIGH (ref 70–99)
Potassium: 4.3 mEq/L (ref 3.5–5.1)
Sodium: 139 mEq/L (ref 135–145)
Total Bilirubin: 0.9 mg/dL (ref 0.2–1.2)
Total Protein: 6.8 g/dL (ref 6.0–8.3)

## 2022-03-29 LAB — CBC WITH DIFFERENTIAL/PLATELET
Basophils Absolute: 0.1 10*3/uL (ref 0.0–0.1)
Basophils Relative: 1 % (ref 0.0–3.0)
Eosinophils Absolute: 0.3 10*3/uL (ref 0.0–0.7)
Eosinophils Relative: 4.4 % (ref 0.0–5.0)
HCT: 44.7 % (ref 36.0–46.0)
Hemoglobin: 14.6 g/dL (ref 12.0–15.0)
Lymphocytes Relative: 29.9 % (ref 12.0–46.0)
Lymphs Abs: 2.1 10*3/uL (ref 0.7–4.0)
MCHC: 32.5 g/dL (ref 30.0–36.0)
MCV: 84.7 fl (ref 78.0–100.0)
Monocytes Absolute: 0.7 10*3/uL (ref 0.1–1.0)
Monocytes Relative: 10 % (ref 3.0–12.0)
Neutro Abs: 3.9 10*3/uL (ref 1.4–7.7)
Neutrophils Relative %: 54.7 % (ref 43.0–77.0)
Platelets: 233 10*3/uL (ref 150.0–400.0)
RBC: 5.28 Mil/uL — ABNORMAL HIGH (ref 3.87–5.11)
RDW: 14.7 % (ref 11.5–15.5)
WBC: 7.1 10*3/uL (ref 4.0–10.5)

## 2022-03-29 LAB — FERRITIN: Ferritin: 31.7 ng/mL (ref 10.0–291.0)

## 2022-03-29 LAB — HEMOGLOBIN A1C: Hgb A1c MFr Bld: 7.3 % — ABNORMAL HIGH (ref 4.6–6.5)

## 2022-03-29 LAB — IRON: Iron: 60 ug/dL (ref 42–145)

## 2022-03-29 LAB — T4, FREE: Free T4: 1.07 ng/dL (ref 0.60–1.60)

## 2022-03-29 LAB — TSH: TSH: 2.85 u[IU]/mL (ref 0.35–5.50)

## 2022-03-29 NOTE — Progress Notes (Signed)
Subjective:    Patient ID: Kristy [name], female    DOB: 23-Aug-1945, 76 y.o.   MRN: [id number]  DOS:  03/29/2022 Type of visit - description: Follow-up  Since the last office visit is doing okay.  Saw cardiology, note reviewed.  Good compliance with iron supplements. Denies any GI symptoms such as nausea, vomiting, diarrhea.  No blood in the stools. No weight loss  Did have a fall last week, no major consequences  Review of Systems See above   Past Medical History:  Diagnosis Date   Allergy    Environmental, foods: shellfish, lactose intolerant, citrus, peanuts   Asthma    mild persistent   Atrophic vaginitis    BRCA gene mutation negative 2015   testing done at [place]   Diabetes mellitus without complication (HCC)    Type 2   DJD (degenerative joint disease)    generalized joint pain, including back   Dysrhythmia    SVT   Family history of malignant neoplasm of breast    Fibromyalgia    Dr Amil Amen   GERD (gastroesophageal reflux disease)    Gout    Idiopathic, chronic   Hiatal hernia    History of kidney stones    Hyperlipidemia    Hypertension    Meniere's disease    R side worse, L side starting, see's Dr. Benjamine Mola.   Palpitations    PSVT on Holter monitoring   Shortness of breath    occ   Stroke [facility])    TIA  2014   SUI (stress urinary incontinence, female)    (resolved after surgery)   TIA (transient ischemic attack)    07/2012, see Dr Leonie Man   Vertigo     Past Surgical History:  Procedure Laterality Date   ABDOMINAL HYSTERECTOMY  1994   TAH,BSO-BLADDER NECK SUSPENSION   BREAST SURGERY     LUMPECTOMY-BENIGN Breast Bx X2   CARPAL TUNNEL RELEASE Right 2005   CATARACT EXTRACTION, BILATERAL Bilateral 2016   Cataract    CHOLECYSTECTOMY, LAPAROSCOPIC  2001   CYSTOCELE REPAIR  1994   EYE SURGERY     JOINT REPLACEMENT     bilatral knees;    KNEE ARTHROPLASTY  05/19/2012   Procedure: COMPUTER ASSISTED TOTAL KNEE ARTHROPLASTY;  Surgeon: Marybelle Killings, MD;  Location: [place];  Service: Orthopedics;  Laterality: Left;  Left  Total Knee Arthroplasty   KNEE ARTHROPLASTY Right 09/03/2013   Procedure: COMPUTER ASSISTED TOTAL KNEE ARTHROPLASTY;  Surgeon: Marybelle Killings, MD;  Location: [place];  Service: Orthopedics;  Laterality: Right;  Right Total Knee Arthroplasty, Computer Assist   KNEE ARTHROSCOPY     bil   LUMBAR LAMINECTOMY/DECOMPRESSION MICRODISCECTOMY N/A 08/18/2020   Procedure: L4-5 decompression, possible left L4-5 microdiscectomy;  Surgeon: Marybelle Killings, MD;  Location: [place];  Service: Orthopedics;  Laterality: N/A;   OOPHORECTOMY     BSO   SURGERY FOR POSERIOR TIBIAL TENDON  2003   TONSILLECTOMY     TRIPLE FUSION LEFT ANKLE Left 2007   TUBAL LIGATION      Current Outpatient Medications  Medication Instructions   aspirin EC 81 mg, Oral, Daily   atorvastatin (LIPITOR) 10 MG tablet TAKE 1 TABLET BY MOUTH EVERYDAY AT BEDTIME   Calcium Carbonate (CALCIUM 600 PO) 600 mg, Oral, 2 times daily, (1600 & bedtime)   Cholecalciferol 2,000 Units, Oral, Daily at bedtime   diltiazem (CARDIZEM CD) 240 MG 24 hr capsule TAKE 1 CAPSULE BY MOUTH EVERY DAY  EPINEPHrine (EPI-PEN) 0.3 mg, See admin instructions   febuxostat (ULORIC) 40 mg, Oral, Every evening   ferrous fumarate (HEMOCYTE - 106 MG FE) 325 (106 Fe) MG TABS tablet 106 mg of iron, Oral, 2 times daily before meals   fluticasone (FLONASE) 50 MCG/ACT nasal spray 2 sprays, Each Nare, Every morning   fluticasone (FLOVENT HFA) 110 MCG/ACT inhaler 2 puffs, Inhalation, Daily   ipratropium (ATROVENT) 0.03 % nasal spray Nasal, 3 times daily PRN   JARDIANCE 10 MG TABS tablet TAKE 1 TABLET DAILY BEFORE BREAKFAST   levalbuterol (XOPENEX HFA) 45 MCG/ACT inhaler 2 puffs, Every 4 hours PRN   levocetirizine (XYZAL) 5 mg, Oral, Daily-1600   losartan (COZAAR) 25 mg, Oral, Daily   metFORMIN (GLUCOPHAGE) 1,275 mg, Oral, 2 times daily with meals   metoprolol succinate (TOPROL-XL) 50 MG 24 hr tablet TAKE 1  TABLET BY MOUTH DAILY WITH OR IMMEDIATELY FOLLOWING A MEAL   nortriptyline (PAMELOR) 25 mg, Oral, Daily-1600   omeprazole (PRILOSEC) 40 MG capsule TAKE 1 CAPSULE BY MOUTH EVERY DAY   pioglitazone (ACTOS) 30 MG tablet TAKE 1 TABLET BY MOUTH EVERY DAY   vitamin E 400 Units, Oral, Daily-1600       Objective:   Physical Exam BP 122/78   Pulse 88   Temp 97.8 F (36.6 C) (Oral)   Resp 18   Ht _0  (1.651 m)   Wt 222 lb (100.7 kg)   SpO2 94%   BMI 36.94 kg/m  General:   Well developed, NAD, BMI noted. HEENT:  Normocephalic . Face symmetric, atraumatic Lungs:  CTA B Normal respiratory effort, no intercostal retractions, no accessory muscle use. Heart: RRR,  no murmur.  Lower extremities: no pretibial edema bilaterally  Skin: Not pale. Not jaundice Neurologic:  alert & oriented X3.  Speech normal, gait appropriate for age and assisted by cane  psych--  Cognition and judgment appear intact.  Cooperative with normal attention span and concentration.  Behavior appropriate. No anxious or depressed appearing.      Assessment   Assessment DM w/ h/o TIA HTN  Hyperlipidemia  Insomnia  Asthma , allergies-- sees Dr Harold Hedge  GERD, hiatal hernia , h/o IBS Obesity  CV: ---Palpitations: PSVT by Holter, Dr Gwenlyn Found ---TIA, dx 2014,last visit w/  Dr. Leonie Man 09-2015, RTC prn ENT: Dr Arnette Norris -Vertigo (improved with vestibular rehabilitation 2015) -HOH- aids, brain  MRI 03/2019.   -Menier's (Dx   2020) MSK: *Osteopenia: dexa per Gyn *Fibromyalgia, DJD, Gout : per Dr. Amil Amen, on [place]  *GOUT *Hyperparathyroidism h/o slt increased PTH 2015 , normal PTH 05-2014  GU:  --Kidney stones, h/o; Urinary incontinence; Atrophic vaginitis OSA dx 08-2021, started CPAP 10/2021 FH breast cancer , sister is BRCA2 (+), pt is  (-) Increased LFTs --- CT abdomen 2014: NormalLiver, negative hepatitis serologies at rheumatology per patient  PLAN: DM: Currently on metformin 3 times daily, pioglitazone,  Jardiance.  Check A1c. HTN: BPs are very good, continue Cardizem, losartan, metoprolol.  Check CMP. IDA: Iron deficient, started iron few months ago, good compliance, rechecking labs, also get a celiac panel..  No GI symptoms, to see GI soon. SVTs: Saw cards last week, was Rx to continue diltiazem and Toprol for SVT, also a Zio patch. Gait disorder: Had a fall last week, she thinks related to vertigo.  No major consequence.  She is taking steps to prevent falls including using a cane, life alarm, and a sitdown shower, etc. more information provided, see AVS. Preventive care: Up-to-date  RTC 4 to 5 months

## 2022-03-29 NOTE — Assessment & Plan Note (Signed)
DM: Currently on metformin 3 times daily, pioglitazone, Jardiance.  Check A1c. HTN: BPs are very good, continue Cardizem, losartan, metoprolol.  Check CMP. IDA: Iron deficient, started iron few months ago, good compliance, rechecking labs, also get a celiac panel..  No GI symptoms, to see GI soon. SVTs: Saw cards last week, was Rx to continue diltiazem and Toprol for SVT, also a Zio patch. Gait disorder: Had a fall last week, she thinks related to vertigo.  No major consequence.  She is taking steps to prevent falls including using a cane, life alarm, and a sitdown shower, etc. more information provided, see AVS. Preventive care: Up-to-date   RTC 4 to 5 months

## 2022-03-29 NOTE — Patient Instructions (Addendum)
Check the  blood pressure regularly BP GOAL is between 110/65 and  135/85. If it is consistently higher or lower, let me know    GO TO THE LAB : Get the blood work     [place] back for a checkup in 4 to 5 months   Fall Prevention in the Home, Adult Falls can cause injuries and affect people of all ages. There are many simple things that you can do to make your home safe and to help prevent falls. Ask for help when making these changes, if needed. What actions can I take to prevent falls? General instructions Use good lighting in all rooms. Replace any light bulbs that burn out, turn on lights if it is dark, and use night-lights. Place frequently used items in easy-to-reach places. Lower the shelves around your home if necessary. Set up furniture so that there are clear paths around it. Avoid moving your furniture around. Remove throw rugs and other tripping hazards from the floor. Avoid walking on wet floors. Fix any uneven floor surfaces. Add color or contrast paint or tape to grab bars and handrails in your home. Place contrasting color strips on the first and last steps of staircases. When you use a stepladder, make sure that it is completely opened and that the sides and supports are firmly locked. Have someone hold the ladder while you are using it. Do not climb a closed stepladder. Know where your pets are when moving through your home. What can I do in the bathroom?     Keep the floor dry. Immediately clean up any water that is on the floor. Remove soap buildup in the tub or shower regularly. Use nonskid mats or decals on the floor of the tub or shower. Attach bath mats securely with double-sided, nonslip rug tape. If you need to sit down while you are in the shower, use a plastic, nonslip stool. Install grab bars by the toilet and in the tub and shower. Do not use towel bars as grab bars. What can I do in the  bedroom? Make sure that a bedside light is easy to reach. Do not use oversized bedding that reaches the floor. Have a firm chair that has side arms to use for getting dressed. What can I do in the kitchen? Clean up any spills right away. If you need to reach for something above you, use a sturdy step stool that has a grab bar. Keep electrical cables out of the way. Do not use floor polish or wax that makes floors slippery. If you must use wax, make sure that it is non-skid floor wax. What can I do with my stairs? Do not leave any items on the stairs. Make sure that you have a light switch at the top and the bottom of the stairs. Have them installed if you do not have them. Make sure that there are handrails on both sides of the stairs. Fix handrails that are broken or loose. Make sure that handrails are as long as the staircases. Install non-slip stair treads on all stairs in your home. Avoid having throw rugs at the top or bottom of stairs, or secure the rugs with carpet tape to prevent them from moving. Choose a carpet design that does not hide the edge of steps on the stairs. Check any carpeting to make sure that it is firmly attached to the stairs. Fix any carpet that is loose or worn.  What can I do on the outside of my home? Use bright outdoor lighting. Regularly repair the edges of walkways and driveways and fix any cracks. Remove high doorway thresholds. Trim any shrubbery on the main path into your home. Regularly check that handrails are securely fastened and in good repair. Both sides of all steps should have handrails. Install guardrails along the edges of any raised decks or porches. Clear walkways of debris and clutter, including tools and rocks. Have leaves, snow, and ice cleared regularly. Use sand or salt on walkways during winter months. In the garage, clean up any spills right away, including grease or oil spills. What other actions can I take? Wear closed-toe shoes  that fit well and support your feet. Wear shoes that have rubber soles or low heels. Use mobility aids as needed, such as canes, walkers, scooters, and crutches. Review your medicines with your health care provider. Some medicines can cause dizziness or changes in blood pressure, which increase your risk of falling. Talk with your health care provider about other ways that you can decrease your risk of falls. This may include working with a physical therapist or trainer to improve your strength, balance, and endurance. Where to find more information Centers for Disease Control and Prevention, [employer]: http://www.wolf.info/ National Institute on Aging: http://kim-miller.com/ Contact a health care provider if: You are afraid of falling at home. You feel weak, drowsy, or dizzy at home. You fall at home. Summary There are many simple things that you can do to make your home safe and to help prevent falls. Ways to make your home safe include removing tripping hazards and installing grab bars in the bathroom. Ask for help when making these changes in your home. This information is not intended to replace advice given to you by your health care provider. Make sure you discuss any questions you have with your health care provider. Document Revised: 02/16/2021 Document Reviewed: 12/19/2019 Elsevier Patient Education  [place].

## 2022-03-30 LAB — IGA: Immunoglobulin A: 120 mg/dL (ref 70–320)

## 2022-03-30 LAB — GLIA(IGA)+IGA+TTG(IGA)
Antigliadin Abs, IgA: 3 units (ref 0–19)
IgA/Immunoglobulin A, Serum: 124 mg/dL (ref 64–422)
Transglutaminase IgA: <2 U/mL (ref 0–3)

## 2022-04-01 MED ORDER — PIOGLITAZONE HCL 45 MG PO TABS: 45.0000 mg | ORAL_TABLET | Freq: Every day | ORAL | 1 refills | Status: DC

## 2022-04-01 NOTE — Addendum Note (Signed)
Addended byDamita Dunnings D on: 04/01/2022 08:40 AM   Modules accepted: Orders

## 2022-04-05 ENCOUNTER — Ambulatory Visit (INDEPENDENT_AMBULATORY_CARE_PROVIDER_SITE_OTHER): Payer: Medicare Other | Admitting: Gastroenterology

## 2022-04-05 ENCOUNTER — Encounter: Payer: Self-pay | Admitting: Gastroenterology

## 2022-04-05 VITALS — BP 124/70 | HR 84 | Ht 63.78 in | Wt 220.0 lb

## 2022-04-05 DIAGNOSIS — D508 Other iron deficiency anemias: Secondary | ICD-10-CM

## 2022-04-05 NOTE — Progress Notes (Signed)
[facility] progress note:  History: Kristy [name] 04/05/2022  Referring provider: Colon Branch, MD  Reason for consult/chief complaint: Anemia   Subjective  HPI:  Kristy [name] was referred back to see me by Dr. Larose Kells for iron deficiency anemia. I saw Kristy [name] for a surveillance colonoscopy in October 2022, at which time 7 subcentimeter tubular adenomas without high-grade dysplasia were removed -3-year recall recommended. Had a fall with left knee large hematoma in April, it took months to resolve and she saw her orthopedist, mainly to make sure there was no damage to her knee replacement. Denies black or bloody stool, change in bowel habits, dysphagia, odynophagia, nausea vomiting early satiety or weight loss. No previous anemia that she can recall.  ROS:  Review of Systems  Constitutional:  Negative for appetite change and unexpected weight change.  HENT:  Negative for mouth sores and voice change.   Eyes:  Negative for pain and redness.  Respiratory:  Negative for cough and shortness of breath.   Cardiovascular:  Negative for chest pain and palpitations.  Genitourinary:  Negative for dysuria and hematuria.  Musculoskeletal:  Positive for arthralgias. Negative for myalgias.  Skin:  Negative for pallor and rash.  Neurological:  Negative for weakness and headaches.  Hematological:  Negative for adenopathy.     Past Medical History: Past Medical History:  Diagnosis Date   Allergy    Environmental, foods: shellfish, lactose intolerant, citrus, peanuts   Asthma    mild persistent   Atrophic vaginitis    BRCA gene mutation negative 2015   testing done at [facility]   Diabetes mellitus without complication (HCC)    Type 2   DJD (degenerative joint disease)    generalized joint pain, including back   Dysrhythmia    SVT   Family history of malignant neoplasm of breast    Fibromyalgia    Dr Amil Amen   GERD (gastroesophageal reflux disease)    Gout     Idiopathic, chronic   Hiatal hernia    History of kidney stones    Hyperlipidemia    Hypertension    Meniere's disease    R side worse, L side starting, see's Dr. Benjamine Mola.   Palpitations    PSVT on Holter monitoring   Shortness of breath    occ   Stroke [facility])    TIA  2014   SUI (stress urinary incontinence, female)    (resolved after surgery)   TIA (transient ischemic attack)    07/2012, see Dr Leonie Man   Vertigo      Past Surgical History: Past Surgical History:  Procedure Laterality Date   ABDOMINAL HYSTERECTOMY  1994   TAH,BSO-BLADDER NECK SUSPENSION   BREAST SURGERY     LUMPECTOMY-BENIGN Breast Bx X2   CARPAL TUNNEL RELEASE Right 2005   CATARACT EXTRACTION, BILATERAL Bilateral 2016   Cataract    CHOLECYSTECTOMY, LAPAROSCOPIC  2001   CYSTOCELE REPAIR  1994   EYE SURGERY     JOINT REPLACEMENT     bilatral knees;    KNEE ARTHROPLASTY  05/19/2012   Procedure: COMPUTER ASSISTED TOTAL KNEE ARTHROPLASTY;  Surgeon: Marybelle Killings, MD;  Location: [place];  Service: Orthopedics;  Laterality: Left;  Left  Total Knee Arthroplasty   KNEE ARTHROPLASTY Right 09/03/2013   Procedure: COMPUTER ASSISTED TOTAL KNEE ARTHROPLASTY;  Surgeon: Marybelle Killings, MD;  Location: [place];  Service: Orthopedics;  Laterality: Right;  Right Total Knee Arthroplasty, Computer Assist   KNEE ARTHROSCOPY  bil   LUMBAR LAMINECTOMY/DECOMPRESSION MICRODISCECTOMY N/A 08/18/2020   Procedure: L4-5 decompression, possible left L4-5 microdiscectomy;  Surgeon: Marybelle Killings, MD;  Location: [place];  Service: Orthopedics;  Laterality: N/A;   OOPHORECTOMY     BSO   SURGERY FOR POSERIOR TIBIAL TENDON  2003   TONSILLECTOMY     TRIPLE FUSION LEFT ANKLE Left 2007   TUBAL LIGATION       Family History: Family History  Problem Relation Age of Onset   Breast cancer Mother 36       deceased 65   Cancer Father        Brain tumor; path?; deceased 56   Breast cancer Sister 58       bilateral breast ca @ 5; BRCA2 positive    Hypertension Sister    Hemochromatosis Sister    Thalassemia Sister    Other Sister        negative for BRCA1 BRCA2   Heart failure Sister        due to chemo?   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 married '69. 2 sons - '70, '72: retired - [employer].     ACP - OK for CPR at least once, no long term ventilation, no heroic measures in the face of poor quality of life. HCPOA - husband, secondary son Komal Stangelo (c) [phone]   No Cafffeine (or very rare).        Social Determinants of Health   Financial Resource Strain: Low Risk  (10/01/2021)   Overall Financial Resource Strain (CARDIA)    Difficulty of Paying Living Expenses: Not hard at all  Food Insecurity: No Food Insecurity (10/01/2021)   Hunger Vital Sign    Worried About Running Out of Food in the Last Year: Never true    Ran Out of Food in the Last Year: Never true  Transportation Needs: No Transportation Needs (10/01/2021)   PRAPARE -  Hydrologist (Medical): No    Lack of Transportation (Non-Medical): No  Physical Activity: Inactive (10/01/2021)   Exercise Vital Sign    Days  of Exercise per Week: 0 days    Minutes of Exercise per Session: 0 min  Stress: No Stress Concern Present (10/01/2021)   [place]    Feeling of Stress : Not at all  Social Connections: Moderately Isolated (10/01/2021)   Social Connection and Isolation Panel [NHANES]    Frequency of Communication with Friends and Family: More than three times a week    Frequency of Social Gatherings with Friends and Family: Never    Attends Religious Services: Never    Marine scientist or Organizations: No    Attends Archivist Meetings: Never    Marital Status: Married    Allergies: Allergies  Allergen Reactions   Iodine Anaphylaxis   Shellfish Allergy Anaphylaxis   Sulfonamide Derivatives Rash   Cephalexin Other (See Comments)   Citrus Hives, Itching and Other (See Comments)    Itching in mouth/ blisters on lips   Lactose Intolerance (Gi) Diarrhea and Swelling    Swollen mouth   Amoxicillin Diarrhea    Did it involve swelling of the face/tongue/throat, SOB, or low BP? No Did it involve sudden or severe rash/hives, skin peeling, or any reaction on the inside of your mouth or nose? No Did you need to seek medical attention at a hospital or doctor's office? No When did it last happen?     58-12 years old  If all above answers are "NO", may proceed with cephalosporin use.    Outpatient Meds: Current Outpatient Medications  Medication Sig Dispense Refill   aspirin EC 81 MG tablet Take 81 mg by mouth daily.     atorvastatin (LIPITOR) 10 MG tablet TAKE 1 TABLET BY MOUTH EVERYDAY AT BEDTIME 90 tablet 1   Calcium Carbonate (CALCIUM 600 PO) Take 600 mg by mouth in the morning and at bedtime. (1600 & bedtime)     Cholecalciferol 50 MCG (2000 UT) CAPS  Take 2,000 Units by mouth at bedtime.      diltiazem (CARDIZEM CD) 240 MG 24 hr capsule TAKE 1 CAPSULE BY MOUTH EVERY DAY 90 capsule 1   febuxostat (ULORIC) 40 MG tablet Take 40 mg by mouth every evening.      ferrous fumarate (HEMOCYTE - 106 MG FE) 325 (106 Fe) MG TABS tablet Take 1 tablet (106 mg of iron total) by mouth 2 (two) times daily before a meal. 60 tablet 3   fluticasone (FLONASE) 50 MCG/ACT nasal spray Place 2 sprays into both nostrils in the morning.     fluticasone (FLOVENT HFA) 110 MCG/ACT inhaler Inhale 2 puffs into the lungs daily.     ipratropium (ATROVENT) 0.03 % nasal spray Place into the nose 3 (three) times daily as needed.     JARDIANCE 10 MG TABS tablet TAKE 1 TABLET DAILY BEFORE BREAKFAST 90 tablet 1   levocetirizine (XYZAL) 5 MG tablet Take 5 mg by mouth daily at 4 PM.     losartan (COZAAR) 25 MG tablet TAKE 1 TABLET (25 MG TOTAL) BY MOUTH DAILY. 90 tablet 1   metFORMIN (GLUCOPHAGE) 850 MG tablet TAKE 1.5 TABLETS (1,275 MG TOTAL) BY MOUTH 2 (TWO) TIMES DAILY WITH A MEAL. 270 tablet 1   metoprolol succinate (TOPROL-XL) 50 MG 24 hr tablet TAKE 1 TABLET BY MOUTH DAILY WITH OR IMMEDIATELY FOLLOWING A MEAL 90 tablet 1   nortriptyline (PAMELOR) 25 MG capsule Take 25 mg by mouth daily at 4 PM.     omeprazole (PRILOSEC) 40 MG capsule TAKE  1 CAPSULE BY MOUTH EVERY DAY 90 capsule 1   pioglitazone (ACTOS) 45 MG tablet Take 1 tablet (45 mg total) by mouth daily. 90 tablet 1   vitamin E 400 UNIT capsule Take 400 Units by mouth daily at 4 PM.     EPINEPHrine (EPI-PEN) 0.3 mg/0.3 mL DEVI Inject 0.3 mg into the muscle See admin instructions. Inject 0.3 mg intramuscularly in the leg as needed for severe allergic reaction, call 911 and then inject again in the other leg. Reported on 06/26/2015 (Patient not taking: Reported on 03/29/2022)     levalbuterol (XOPENEX HFA) 45 MCG/ACT inhaler Inhale 2 puffs into the lungs every 4 (four) hours as needed for shortness of breath. (Patient not taking:  Reported on 03/24/2022)     No current facility-administered medications for this visit.      ___________________________________________________________________ Objective   Exam:  BP 124/70   Pulse 84   Ht 5' 3.78" (1.62 m)   Wt 220 lb (99.8 kg)   SpO2 95%   BMI 38.02 kg/m  Wt Readings from Last 3 Encounters:  04/05/22 220 lb (99.8 kg)  03/29/22 222 lb (100.7 kg)  03/24/22 219 lb 9.6 oz (99.6 kg)    General: Well-appearing, very pleasant Eyes: sclera anicteric, no redness ENT: oral mucosa moist without lesions, no cervical or supraclavicular lymphadenopathy CV: Regular without murmur, no JVD, no peripheral edema Resp: clear to auscultation bilaterally, normal RR and effort noted GI: soft, no tenderness, with active bowel sounds. No guarding or palpable organomegaly noted. Skin; warm and dry, no rash or jaundice noted.  Hemosiderin deposition anterior left knee Neuro: awake, alert and oriented x 3. Normal gross motor function and fluent speech  Labs: She has periodically had a low MCV with normal hemoglobin (2021, and again February 2023:     Latest Ref Rng & Units 03/29/2022   11:11 AM 11/26/2021   11:04 AM 07/15/2021   11:32 AM  CBC  WBC 4.0 - 10.5 K/uL 7.1  8.0  8.5   Hemoglobin 12.0 - 15.0 g/dL 14.6  10.4  12.1   Hematocrit 36.0 - 46.0 % 44.7  34.2  39.2   Platelets 150.0 - 400.0 K/uL 233.0  322.0  308.0            MCV 64     Latest Ref Rng & Units 03/29/2022   11:11 AM 07/15/2021   11:32 AM 01/09/2021    9:34 AM  CMP  Glucose 70 - 99 mg/dL 116  113  109   BUN 6 - 23 mg/dL _0 Creatinine 0.40 - 1.20 mg/dL 0.87  0.99  0.91   Sodium 135 - 145 mEq/L 139  139  139   Potassium 3.5 - 5.1 mEq/L 4.3  4.3  4.8   Chloride 96 - 112 mEq/L 103  102  104   CO2 19 - 32 mEq/L _1 Calcium 8.4 - 10.5 mg/dL 9.6  10.4  10.3   Total Protein 6.0 - 8.3 g/dL 6.8  7.9    Total Bilirubin 0.2 - 1.2 mg/dL 0.9  1.0    Alkaline Phos 39 - 117 U/L 64  58    AST 0 -  37 U/L 18  14    ALT 0 - 35 U/L 22  15     FOBT negative x 3 July of this year  Neg tTG Ab late Oct, (nml IgA level)  Iron/TIBC/Ferritin/ %Sat  Component Value Date/Time   IRON 60 03/29/2022 1111   FERRITIN 31.7 03/29/2022 1111   11/26/21:   Iron 22, ferritin 11  Assessment: Encounter Diagnosis  Name Primary?   Other iron deficiency anemia Yes    IDA of unknown cause, heme-negative, now normalized after iron replacement. No localizing GI symptoms, and with heme-negative stool this does not seem to be from chronic GI blood loss.  Negative celiac antibody The left leg hematoma in the spring may have contributed, though I agree with primary care it is unusual for that to cause iron deficiency. No endoscopic testing planned at present.  I recommend she stop iron and have her blood counts rechecked in 6 to 8 weeks with Dr. Larose Kells, return to see me as needed.  Thank you for the courtesy of this consult.  Please call me with any questions or concerns.  Kristy [name]  CC: Referring provider noted above

## 2022-04-05 NOTE — Patient Instructions (Signed)
_______________________________________________________  If you are age 77 or older, your body mass index should be between 23-30. Your Body mass index is 38.02 kg/m. If this is out of the aforementioned range listed, please consider follow up with your Primary Care Provider.  If you are age 25 or younger, your body mass index should be between 19-25. Your Body mass index is 38.02 kg/m. If this is out of the aformentioned range listed, please consider follow up with your Primary Care Provider.   ________________________________________________________  The [facility] providers would like to encourage you to use [facility] to communicate with providers for non-urgent requests or questions.  Due to long hold times on the telephone, sending your provider a message by [facility] may be a faster and more efficient way to get a response.  Please allow 48 business hours for a response.  Please remember that this is for non-urgent requests.  _______________________________________________________  It was a pleasure to see you today!  Thank you for trusting me with your gastrointestinal care!

## 2022-04-09 ENCOUNTER — Ambulatory Visit
Admission: RE | Admit: 2022-04-09 | Discharge: 2022-04-09 | Disposition: A | Discharge status: Home / Self Care | Payer: Medicare Other | Source: Ambulatory Visit | Attending: Obstetrics & Gynecology | Admitting: Obstetrics & Gynecology

## 2022-04-09 DIAGNOSIS — Z1231 Encounter for screening mammogram for malignant neoplasm of breast: Secondary | ICD-10-CM | POA: Diagnosis not present

## 2022-04-20 DIAGNOSIS — R002 Palpitations: Secondary | ICD-10-CM | POA: Diagnosis not present

## 2022-04-21 ENCOUNTER — Ambulatory Visit: Payer: Medicare Other | Admitting: Neurology

## 2022-04-21 ENCOUNTER — Ambulatory Visit (INDEPENDENT_AMBULATORY_CARE_PROVIDER_SITE_OTHER): Payer: Medicare Other | Admitting: Neurology

## 2022-04-21 ENCOUNTER — Encounter: Payer: Self-pay | Admitting: Neurology

## 2022-04-21 VITALS — BP 119/73 | HR 80 | Ht 65.0 in | Wt 222.0 lb

## 2022-04-21 DIAGNOSIS — G4733 Obstructive sleep apnea (adult) (pediatric): Secondary | ICD-10-CM

## 2022-04-21 DIAGNOSIS — Z789 Other specified health status: Secondary | ICD-10-CM

## 2022-04-21 NOTE — Progress Notes (Signed)
Subjective:    Patient ID: Kristy [name] is a 76 y.o. female.  HPI    Interim history:   Kristy [name] is a 76 year old right-handed woman with an underlying medical history of TIA, stroke, dizziness (seen by Dr. Leonie Man in the past), carotid artery stenosis, asthma, allergies, diabetes, degenerative joint disease, fibromyalgia, vertigo, reflux disease, gout, history of kidney stones, hypertension, hyperlipidemia, palpitations with history of PSVT, and obesity, who presents for follow-up consultation of her obstructive sleep apnea, after her interim CPAP titration study. The patient is unaccompanied today.  I last saw her on 12/16/21, at which time she was compliant with her AutoPap.  She had adjusted well to treatment.  We proceeded with an overnight pulse oximetry test.  She had an overnight pulse oximetry test on 12/21/2021 through 12/22/2021.  Patient was on her PAP machine and duration of test was 7 hours and 19 minutes.  Average oxygen saturation was 91.8%, nadir was 81%, time below or at 88% saturation was 16 minutes and 49 seconds.  She had just a handful of desaturations below 85%, several desaturations into the higher in mid 80s.  I recommended that patient come in for a proper CPAP titration study.   She had a CPAP titration study on 01/11/2022.  She did well with CPAP of 16 cm.  We proceeded to change her from AutoPap to CPAP at home.  She was originally placed on home CPAP of 16 cm but in September 2023 we reduce the pressure from 16 to 14 cm as the pressure was too high for her to tolerate.   Today, 04/21/2022: I reviewed her CPAP compliance data from 03/21/2022 through 04/19/2022, which is a total of 30 days, during which time she used her machine every night with the exception of 1.  Percent use days greater than 4 hours was 97%, indicating excellent compliance with an average usage of 6 hours and 54 minutes, residual AHI at goal at 3.1/h, pressure of 14 cm with EPR of 3.  Leak acceptable with  the 95th percentile at 15.7 mg/min.  She reports doing well with her CPAP, she is adjusted to treatment, started off with a F20 fullface mask but broke out in a rash, and also pinched at the nasal bridge, the medium was too big and she switched to a small.  Subsequently she switched to a F30 fullface mask from [employer] which is under the nose and mouth but still had issues with a rash, she may be allergic to silicone as she also sometimes breaks out in a rash when she wears her Apple Watch.  She is very motivated to continue with treatment, she had to skip at night because her husband had to be hospitalized at night.  She is under stress, he was in the ICU and is still in the hospital, he may be able to be discharged today, she is going to go to the hospital straight from here.  She also worries about her son who has complications of diabetes.   The patient's allergies, current medications, family history, past medical history, past social history, past surgical history and problem list were reviewed and updated as appropriate.    Previously (copied from previous notes for reference):   I first met her at the request of her primary care physician on 09/15/2021, at which time she reported snoring and excessive daytime somnolence.  She was advised to proceed with a sleep study.  She had a baseline sleep study on 09/21/2021 which showed  a sleep efficiency of 75.3%, sleep latency delayed at 41.5 minutes and REM latency delayed at 169 minutes.  She had mild to moderate sleep fragmentation with wake after sleep onset at 76.5 minutes.  She had a decreased percentage of REM sleep at 9.1%.  Total AHI based on hypopneas was 11.5/h, REM AHI 68/h, supine AHI 16.5/h.  Average oxygen saturation was 94%, nadir was 84%.  Time below 89% saturation was 62 minutes for the night.  She did get started on supplemental oxygen at 1 L/min as she had lower average oxygen saturations around 87% during sleep.  Her sleep related average  oxygen saturations improved to 90% after supplemental oxygen.  Based on her test results she was advised to start AutoPap therapy.  Her set up date was 11/03/2021.  She has a [employer]Med air sense 11 AutoSet machine.  I reviewed her AutoPap compliance data from 11/15/2021 through 12/14/2021, which is a total of 30 days, during which time she used her machine 29 days with percent use days greater than 4 hours at 97%, indicating excellent compliance with an average usage of 7 hours and 22 minutes, residual AHI borderline at 4.8/h, average pressure for the 95th percentile at 9.9 cm with a range of 5 to 11 cm, leak acceptable with the 95th percentile at 9.2 L/min.     09/15/21: (She) reports snoring and excessive daytime somnolence. I reviewed your office note from 07/15/2021. Her Epworth sleepiness score is 20 out of 24, fatigue severity score is 63 out of 63.  Symptoms have become worse over time.  She has had intermittent vertigo and has seen Dr. Benjamine Mola.  She goes to bed generally around 10:30 AM and rise time is around 9 AM.  Some nights she has a hard time sleeping, and some nights she sleeps a lot.  She has nocturia about twice per average 9 and has had occasional morning headaches in the front.  She reports a family history of sleep apnea, her son has a CPAP machine.  She sleeps on a wedge, it helps her reflux symptoms in her breathing.  She wakes up with dry mouth.  She has allergy symptoms and asthma.  She does not drink caffeine daily.  She does not drink alcohol, she is a non-smoker.  Her Past Medical History Is Significant For: Past Medical History:  Diagnosis Date   Allergy    Environmental, foods: shellfish, lactose intolerant, citrus, peanuts   Asthma    mild persistent   Atrophic vaginitis    BRCA gene mutation negative 2015   testing done at [facility]   Diabetes mellitus without complication (HCC)    Type 2   DJD (degenerative joint disease)    generalized joint pain, including back    Dysrhythmia    SVT   Family history of malignant neoplasm of breast    Fibromyalgia    Dr Amil Amen   GERD (gastroesophageal reflux disease)    Gout    Idiopathic, chronic   Hiatal hernia    History of kidney stones    Hyperlipidemia    Hypertension    Meniere's disease    R side worse, L side starting, see's Dr. Benjamine Mola.   Palpitations    PSVT on Holter monitoring   Shortness of breath    occ   Stroke [facility])    TIA  2014   SUI (stress urinary incontinence, female)    (resolved after surgery)   TIA (transient ischemic attack)    07/2012, see  Dr Leonie Man   Vertigo     Her Past Surgical History Is Significant For: Past Surgical History:  Procedure Laterality Date   ABDOMINAL HYSTERECTOMY  1994   TAH,BSO-BLADDER NECK SUSPENSION   BREAST SURGERY     LUMPECTOMY-BENIGN Breast Bx X2   CARPAL TUNNEL RELEASE Right 2005   CATARACT EXTRACTION, BILATERAL Bilateral 2016   Cataract    CHOLECYSTECTOMY, LAPAROSCOPIC  2001   CYSTOCELE REPAIR  1994   EYE SURGERY     JOINT REPLACEMENT     bilatral knees;    KNEE ARTHROPLASTY  05/19/2012   Procedure: COMPUTER ASSISTED TOTAL KNEE ARTHROPLASTY;  Surgeon: Marybelle Killings, MD;  Location: [facility];  Service: Orthopedics;  Laterality: Left;  Left  Total Knee Arthroplasty   KNEE ARTHROPLASTY Right 09/03/2013   Procedure: COMPUTER ASSISTED TOTAL KNEE ARTHROPLASTY;  Surgeon: Marybelle Killings, MD;  Location: [place];  Service: Orthopedics;  Laterality: Right;  Right Total Knee Arthroplasty, Computer Assist   KNEE ARTHROSCOPY     bil   LUMBAR LAMINECTOMY/DECOMPRESSION MICRODISCECTOMY N/A 08/18/2020   Procedure: L4-5 decompression, possible left L4-5 microdiscectomy;  Surgeon: Marybelle Killings, MD;  Location: [place];  Service: Orthopedics;  Laterality: N/A;   OOPHORECTOMY     BSO   SURGERY FOR POSERIOR TIBIAL TENDON  2003   TONSILLECTOMY     TRIPLE FUSION LEFT ANKLE Left 2007   TUBAL LIGATION      Her Family History Is Significant For: Family History  Problem  Relation Age of Onset   Breast cancer Mother 31       deceased 17   Cancer Father        Brain tumor; path?; deceased 63   Breast cancer Sister 84       bilateral breast ca @ 69; BRCA2 positive   Hypertension Sister    Hemochromatosis Sister    Thalassemia Sister    Other Sister        negative for BRCA1 BRCA2   Heart failure Sister        due to chemo?   Lymphoma Maternal Aunt        deceased 2s   Cancer Maternal Uncle    CAD Son        MI   Diabetes Son    Leukemia Son    Breast cancer Cousin        BRCA2 positive   Diabetes Other    Breast cancer Other        distant maternal female relatives with breast cancer   Colon cancer Neg Hx    Colon polyps Neg Hx    Esophageal cancer Neg Hx    Rectal cancer Neg Hx    Stomach cancer Neg Hx     Her Social History Is Significant For: Social History   Socioeconomic History   Marital status: Married    Spouse name: Sonia Side   Number of children: 2   Years of education: 16   Highest education level: Some college, no degree  Occupational History   Occupation: Hospital doctor-- retired     Fish farm manager: RETIRED  Tobacco Use   Smoking status: Never   Smokeless tobacco: Never  Vaping Use   Vaping Use: Never used  Substance and Sexual Activity   Alcohol use: No    Alcohol/week: 0.0 standard drinks of alcohol   Drug use: No   Sexual activity: Not Currently    Birth control/protection: Surgical  Other Topics Concern   Not on file  Social History Narrative   HSG   1 year college for accounting. married '69. 2 sons - '70, '72: retired - [employer].     ACP - OK for CPR at least once, no long term ventilation, no heroic measures in the face of poor quality of life. HCPOA - husband, secondary son Donie Moulton (c) [phone]   No Cafffeine (or very rare).    Right Handed       Social Determinants of Health   Financial Resource Strain: Low Risk  (10/01/2021)   Overall Financial Resource Strain (CARDIA)    Difficulty of Paying Living  Expenses: Not hard at all  Food Insecurity: No Food Insecurity (10/01/2021)   Hunger Vital Sign    Worried About Running Out of Food in the Last Year: Never true    Ran Out of Food in the Last Year: Never true  Transportation Needs: No Transportation Needs (10/01/2021)   PRAPARE - Hydrologist (Medical): No    Lack of Transportation (Non-Medical): No  Physical Activity: Inactive (10/01/2021)   Exercise Vital Sign    Days of Exercise per Week: 0 days    Minutes of Exercise per Session: 0 min  Stress: No Stress Concern Present (10/01/2021)   [place]    Feeling of Stress : Not at all  Social Connections: Moderately Isolated (10/01/2021)   Social Connection and Isolation Panel [NHANES]    Frequency of Communication with Friends and Family: More than three times a week    Frequency of Social Gatherings with Friends and Family: Never    Attends Religious Services: Never    Marine scientist or Organizations: No    Attends Music therapist: Never    Marital Status: Married    Her Allergies Are:  Allergies  Allergen Reactions   Iodine Anaphylaxis   Shellfish Allergy Anaphylaxis   Sulfonamide Derivatives Rash   Cephalexin Other (See Comments)   Citrus Hives, Itching and Other (See Comments)    Itching in mouth/ blisters on lips   Lactose Intolerance (Gi) Diarrhea and Swelling    Swollen mouth   Amoxicillin Diarrhea    Did it involve swelling of the face/tongue/throat, SOB, or low BP? No Did it involve sudden or severe rash/hives, skin peeling, or any reaction on the inside of your mouth or nose? No Did you need to seek medical attention at a hospital or doctor's office? No When did it last happen?     64-27 years old  If all above answers are "NO", may proceed with cephalosporin use.  :   Her Current Medications Are:  Outpatient Encounter Medications as of 04/21/2022   Medication Sig   aspirin EC 81 MG tablet Take 81 mg by mouth daily.   atorvastatin (LIPITOR) 10 MG tablet TAKE 1 TABLET BY MOUTH EVERYDAY AT BEDTIME   Calcium Carbonate (CALCIUM 600 PO) Take 600 mg by mouth in the morning and at bedtime. (1600 & bedtime)   Cholecalciferol 50 MCG (2000 UT) CAPS Take 2,000 Units by mouth at bedtime.    diltiazem (CARDIZEM CD) 240 MG 24 hr capsule TAKE 1 CAPSULE BY MOUTH EVERY DAY   febuxostat (ULORIC) 40 MG tablet Take 40 mg by mouth every evening.    fluticasone (FLOVENT HFA) 110 MCG/ACT inhaler Inhale 2 puffs into the lungs daily.   ipratropium (ATROVENT) 0.03 % nasal spray Place into the nose 3 (three) times daily as needed.  JARDIANCE 10 MG TABS tablet TAKE 1 TABLET DAILY BEFORE BREAKFAST   levalbuterol (XOPENEX HFA) 45 MCG/ACT inhaler Inhale 2 puffs into the lungs as needed for shortness of breath.   levocetirizine (XYZAL) 5 MG tablet Take 5 mg by mouth daily at 4 PM.   losartan (COZAAR) 25 MG tablet TAKE 1 TABLET (25 MG TOTAL) BY MOUTH DAILY.   metFORMIN (GLUCOPHAGE) 850 MG tablet TAKE 1.5 TABLETS (1,275 MG TOTAL) BY MOUTH 2 (TWO) TIMES DAILY WITH A MEAL.   metoprolol succinate (TOPROL-XL) 50 MG 24 hr tablet TAKE 1 TABLET BY MOUTH DAILY WITH OR IMMEDIATELY FOLLOWING A MEAL   nortriptyline (PAMELOR) 25 MG capsule Take 25 mg by mouth daily at 4 PM.   omeprazole (PRILOSEC) 40 MG capsule TAKE 1 CAPSULE BY MOUTH EVERY DAY   pioglitazone (ACTOS) 45 MG tablet Take 1 tablet (45 mg total) by mouth daily.   vitamin E 400 UNIT capsule Take 400 Units by mouth daily at 4 PM.   EPINEPHrine (EPI-PEN) 0.3 mg/0.3 mL DEVI Inject 0.3 mg into the muscle See admin instructions. Inject 0.3 mg intramuscularly in the leg as needed for severe allergic reaction, call 911 and then inject again in the other leg. Reported on 06/26/2015 (Patient not taking: Reported on 04/21/2022)   ferrous fumarate (HEMOCYTE - 106 MG FE) 325 (106 Fe) MG TABS tablet Take 1 tablet (106 mg of iron total)  by mouth 2 (two) times daily before a meal. (Patient not taking: Reported on 04/21/2022)   fluticasone (FLONASE) 50 MCG/ACT nasal spray Place 2 sprays into both nostrils in the morning.   No facility-administered encounter medications on file as of 04/21/2022.  :  Review of Systems:  Out of a complete 14 point review of systems, all are reviewed and negative with the exception of these symptoms as listed below:  Review of Systems  Neurological:        Pt is here for Follow Up. Pt states that everything is going good with CPAP Machine. Pt states that she has a rash on her face from mask. Pt states that rash stings, with no itching.  ESS: 12     Objective:  Neurological Exam  Physical Exam Physical Examination:   Vitals:   04/21/22 1054  BP: 119/73  Pulse: 80    General Examination: The patient is a very pleasant 76 y.o. female in no acute distress. She appears well-developed and well-nourished and well groomed.   HEENT: Normocephalic, atraumatic, pupils are equal, round and reactive to light, extraocular tracking is good without limitation to gaze excursion or nystagmus noted. Hearing is grossly intact. Face is symmetric with normal facial animation. Mild rash paranasal areas, in keeping with contact dermatitis from PAP mask.  Speech is clear with no dysarthria noted. There is no hypophonia. There is no lip, neck/head, jaw or voice tremor. Neck is supple with full range of passive and active motion. There are no carotid bruits on auscultation. Oropharynx exam reveals: Mild to moderate mouth dryness, adequate dental hygiene and moderate airway crowding. Tongue protrudes centrally and palate elevates symmetrically.   Chest: Clear to auscultation without wheezing, rhonchi or crackles noted.   Heart: S1+S2+0, regular and normal without murmurs, rubs or gallops noted.    Abdomen: Soft, non-tender and non-distended.   Extremities: There is puffiness both distal lower extremities.       Skin: Warm and dry without trophic changes noted.    Musculoskeletal: exam reveals no obvious joint deformities.    Neurologically:  Mental status: The patient is awake, alert and oriented in all 4 spheres. Her immediate and remote memory, attention, language skills and fund of knowledge are appropriate. There is no evidence of aphasia, agnosia, apraxia or anomia. Speech is clear with normal prosody and enunciation. Thought process is linear. Mood is normal and affect is normal.  Cranial nerves II - XII are as described above under HEENT exam.  Motor exam: Normal bulk, strength and tone is noted. There is no obvious tremor. Fine motor skills and coordination: grossly intact.  Cerebellar testing: No dysmetria or intention tremor. There is no truncal or gait ataxia.  Sensory exam: intact to light touch in the upper and lower extremities.  Gait, station and balance: She stands slowly and uses a cane.    Assessment and Plan:     In summary, SYRAH DAUGHTREY is a very pleasant 76 year old female with an underlying medical history of TIA, stroke, dizziness (followed previously by my colleague, Dr. Leonie Man), carotid artery stenosis, asthma, allergies, diabetes, degenerative joint disease, fibromyalgia, vertigo, reflux disease, gout, history of kidney stones, hypertension, hyperlipidemia, palpitations with history of PSVT, and obesity, who presents for follow-up consultation of her obstructive sleep apnea after interim CPAP titration study and starting CPAP therapy.  She had a baseline sleep study on 09/21/2021 which showed a total AHI of 11.5/h.  She had lower average oxygen saturations was treated with supplemental oxygen at 1 L/min during her baseline sleep study, O2 nadir was 82%.  Her apnea scores were adequate while on AutoPap therapy but she had a subsequent abnormal pulse oximetry test overnight, while on AutoPap machine.  She had a CPAP titration study in August 2023.  She did well with CPAP of 16 cm but  we reduce the pressure in September 2023 to 14 cm.  She is compliant with treatment.  She has benefited from therapy.  She is very motivated to continue with CPAP therapy and tolerates the pressure at this time but has had a rash from the 2 different fullface masks she has tried this far.  I provided a sample for a [employer] AirTouch fullface mask which has a foam rim rather than silicone.  She is willing to try it.  If she likes this mask and does well with that with regards to improvement of rash, she can reorder it through her DME provider, [facility].  At this juncture, she is commended for treatment adherence and advised to follow-up routinely in sleep clinic to see one of the nurse practitioners in 1 year. I answered all her questions today and the patient was in agreement. I spent 40 minutes in total face-to-face time and in reviewing records during pre-charting, more than 50% of which was spent in counseling and coordination of care, reviewing test results, reviewing medications and treatment regimen and/or in discussing or reviewing the diagnosis of OSA, the prognosis and treatment options. Pertinent laboratory and imaging test results that were available during this visit with the patient were reviewed by me and considered in my medical decision making (see chart for details).

## 2022-04-21 NOTE — Patient Instructions (Signed)
Please continue using your CPAP regularly. While your insurance requires that you use CPAP at least 4 hours each night on 70% of the nights, I recommend, that you not skip any nights and use it throughout the night if you can. Getting used to CPAP and staying with the treatment long term does take time and patience and discipline. Untreated obstructive sleep apnea when it is moderate to severe can have an adverse impact on cardiovascular health and raise her risk for heart disease, arrhythmias, hypertension, congestive heart failure, stroke and diabetes. Untreated obstructive sleep apnea causes sleep disruption, nonrestorative sleep, and sleep deprivation. This can have an impact on your day to day functioning and cause daytime sleepiness and impairment of cognitive function, memory loss, mood disturbance, and problems focussing. Using CPAP regularly can improve these symptoms. Keep up the good work!   Please try to use the new mask that I provided today in a sample: it is called [employer] Airtouch F20, small. If you like it, you can reorder it through [facility]. We can see you in 1 year, you can see one of our nurse practitioners as you are stable. I will see you after that.

## 2022-04-29 DIAGNOSIS — M65321 Trigger finger, right index finger: Secondary | ICD-10-CM | POA: Diagnosis not present

## 2022-04-29 DIAGNOSIS — M1991 Primary osteoarthritis, unspecified site: Secondary | ICD-10-CM | POA: Diagnosis not present

## 2022-04-29 DIAGNOSIS — M5136 Other intervertebral disc degeneration, lumbar region: Secondary | ICD-10-CM | POA: Diagnosis not present

## 2022-04-29 DIAGNOSIS — Z6836 Body mass index (BMI) 36.0-36.9, adult: Secondary | ICD-10-CM | POA: Diagnosis not present

## 2022-04-29 DIAGNOSIS — M1A09X0 Idiopathic chronic gout, multiple sites, without tophus (tophi): Secondary | ICD-10-CM | POA: Diagnosis not present

## 2022-04-29 DIAGNOSIS — R945 Abnormal results of liver function studies: Secondary | ICD-10-CM | POA: Diagnosis not present

## 2022-04-29 DIAGNOSIS — M797 Fibromyalgia: Secondary | ICD-10-CM | POA: Diagnosis not present

## 2022-04-29 DIAGNOSIS — E669 Obesity, unspecified: Secondary | ICD-10-CM | POA: Diagnosis not present

## 2022-05-05 ENCOUNTER — Other Ambulatory Visit: Payer: Self-pay | Admitting: Internal Medicine

## 2022-05-06 ENCOUNTER — Other Ambulatory Visit: Payer: Self-pay | Admitting: *Deleted

## 2022-05-06 MED ORDER — METOPROLOL SUCCINATE ER 25 MG PO TB24: 25.0000 mg | ORAL_TABLET | Freq: Every day | ORAL | 3 refills | Status: DC

## 2022-05-13 ENCOUNTER — Other Ambulatory Visit: Payer: Self-pay | Admitting: Internal Medicine

## 2022-05-13 IMAGING — CR DG CHEST 2V
2 series · 2 of 2 positions shown · non-contrast
Comparison: Chest x-ray 02/23/2019.

CLINICAL DATA: 74-year-old female under postoperative evaluation.

EXAM:
CHEST - 2 VIEW

[w chest pa]
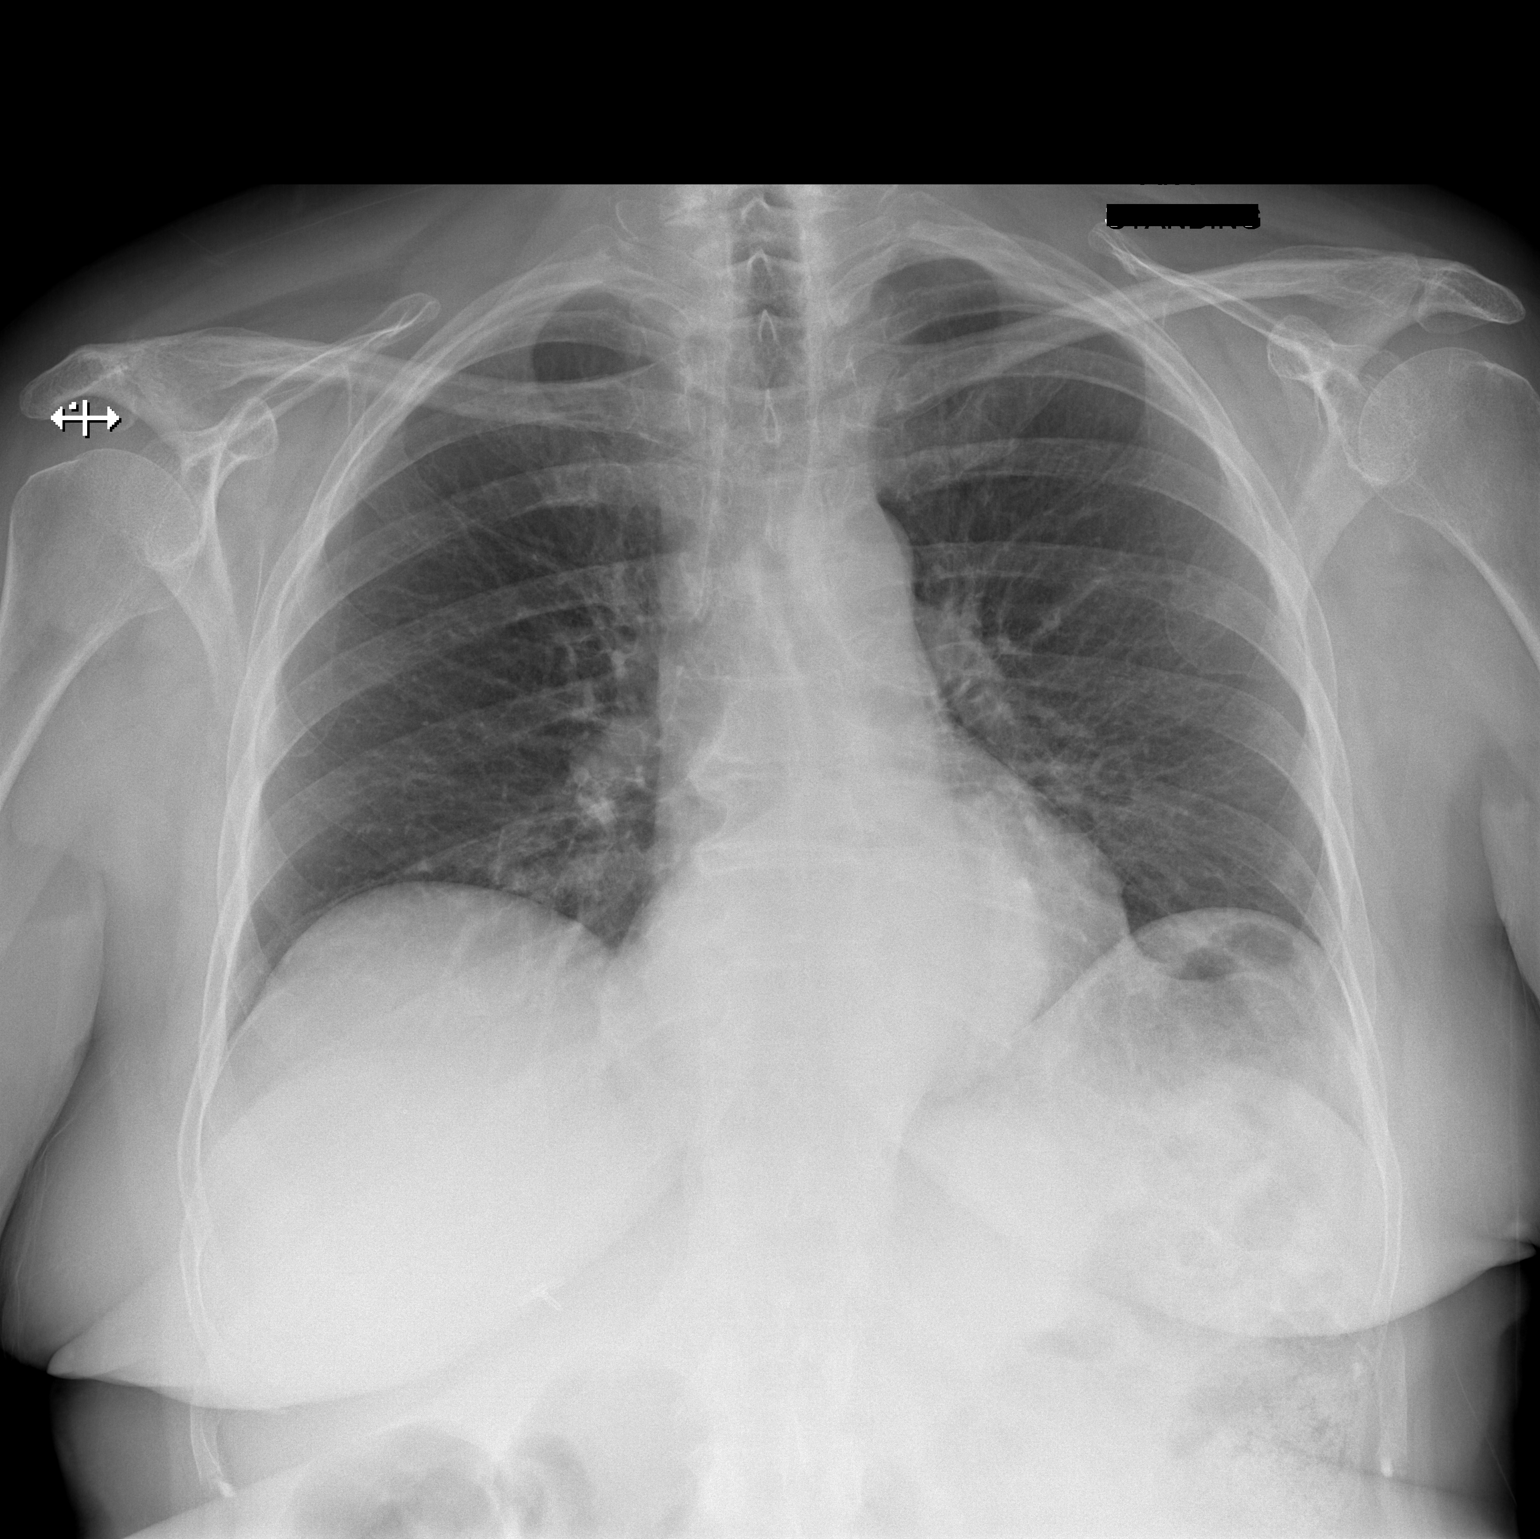

[w chest lat]
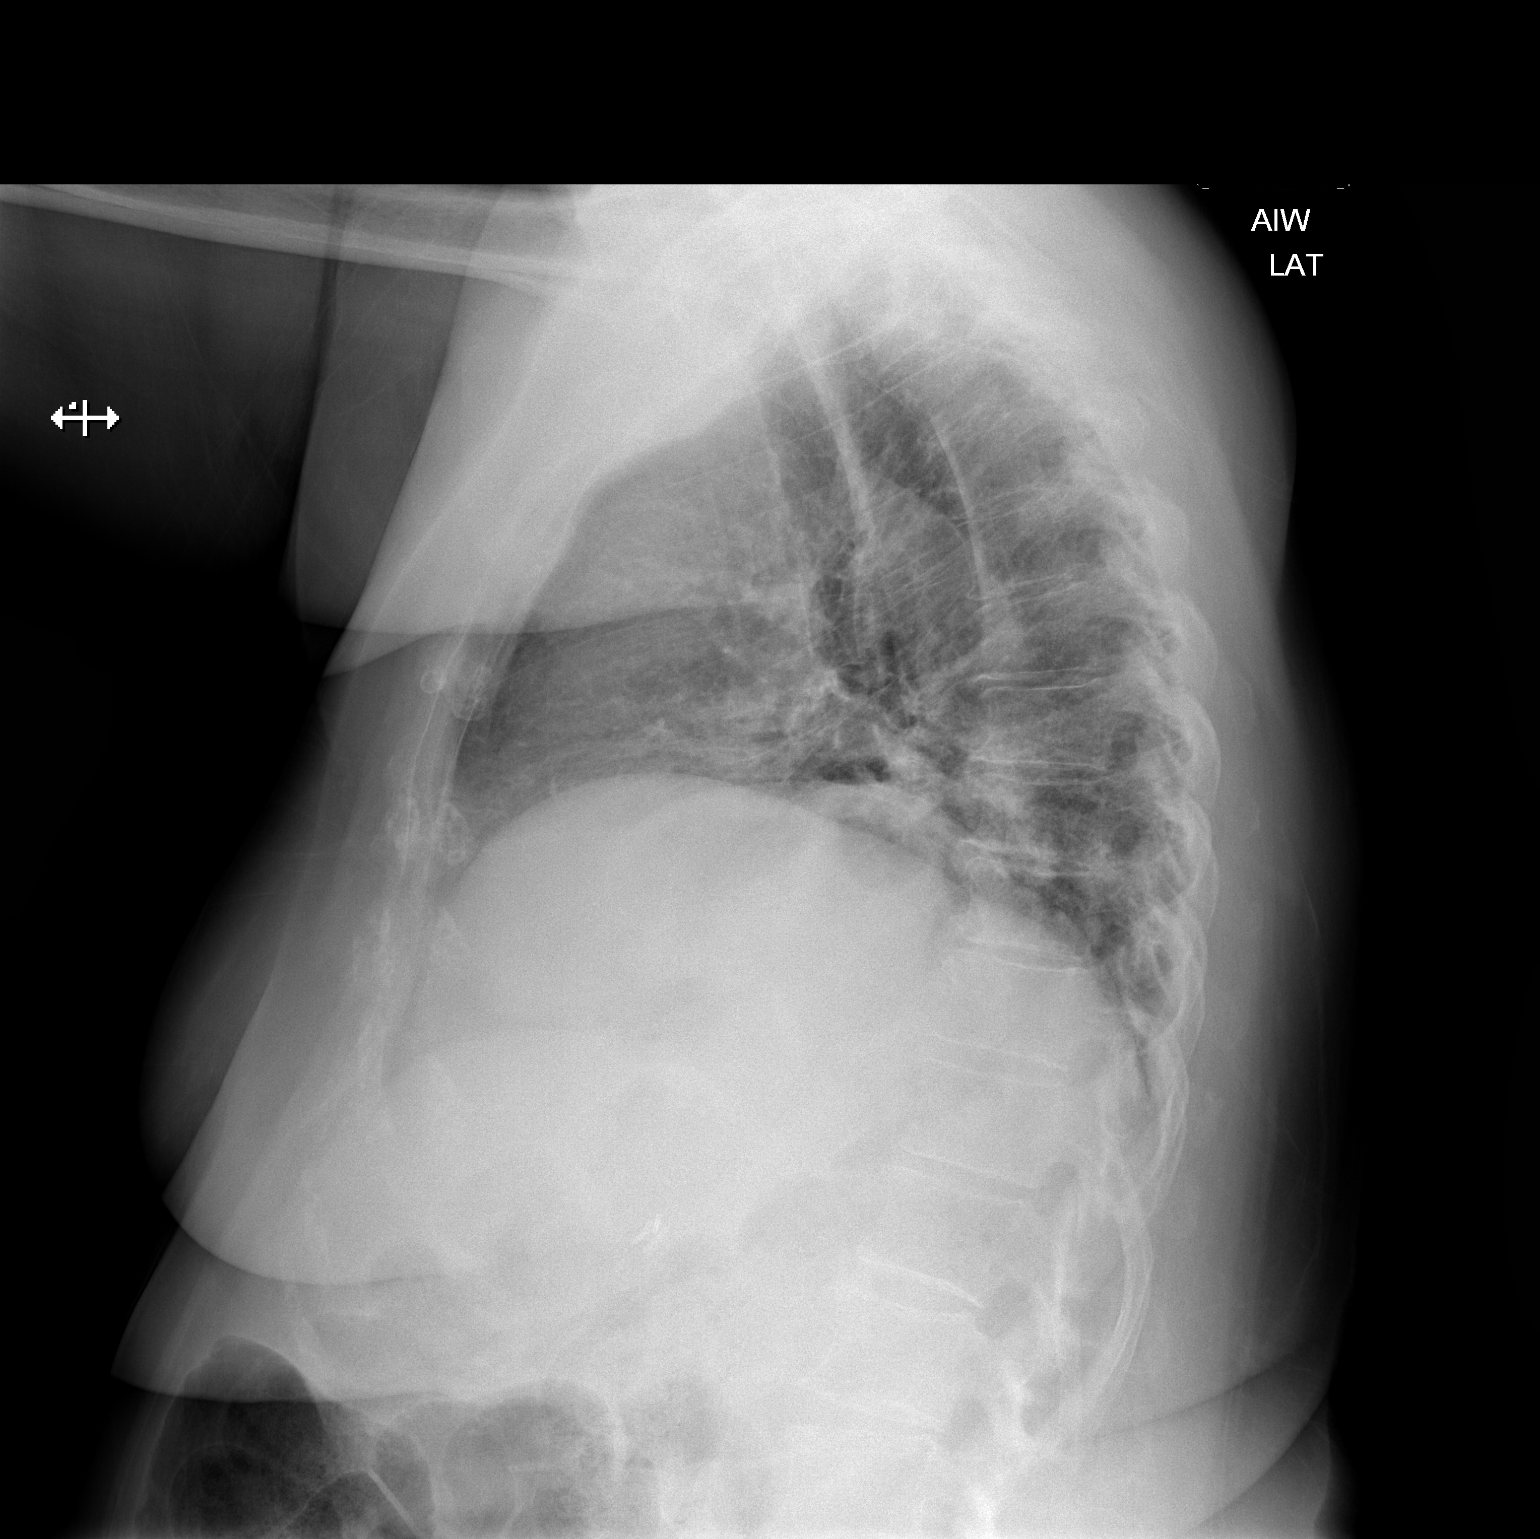

[2 of 2 positions shown; findings below may reference images not displayed]

FINDINGS: Lung volumes are low. No consolidative airspace disease. No pleural
effusions. No pneumothorax. No pulmonary nodule or mass noted.
Pulmonary vasculature is normal. Heart size is normal. Large hiatal
hernia. Upper mediastinal contours are within normal limits.
IMPRESSION: 1. Low lung volumes without radiographic evidence of acute
cardiopulmonary disease.
2. Large hiatal hernia.

## 2022-06-20 ENCOUNTER — Other Ambulatory Visit: Payer: Self-pay | Admitting: Internal Medicine

## 2022-06-21 MED ORDER — METOPROLOL SUCCINATE ER 25 MG PO TB24: 25.0000 mg | ORAL_TABLET | Freq: Every day | ORAL | 1 refills | Status: DC

## 2022-06-30 DIAGNOSIS — H5212 Myopia, left eye: Secondary | ICD-10-CM | POA: Diagnosis not present

## 2022-06-30 DIAGNOSIS — H43393 Other vitreous opacities, bilateral: Secondary | ICD-10-CM | POA: Diagnosis not present

## 2022-06-30 DIAGNOSIS — H35033 Hypertensive retinopathy, bilateral: Secondary | ICD-10-CM | POA: Diagnosis not present

## 2022-06-30 DIAGNOSIS — E119 Type 2 diabetes mellitus without complications: Secondary | ICD-10-CM | POA: Diagnosis not present

## 2022-06-30 DIAGNOSIS — H53143 Visual discomfort, bilateral: Secondary | ICD-10-CM | POA: Diagnosis not present

## 2022-06-30 DIAGNOSIS — H26493 Other secondary cataract, bilateral: Secondary | ICD-10-CM | POA: Diagnosis not present

## 2022-06-30 DIAGNOSIS — H43813 Vitreous degeneration, bilateral: Secondary | ICD-10-CM | POA: Diagnosis not present

## 2022-06-30 DIAGNOSIS — I10 Essential (primary) hypertension: Secondary | ICD-10-CM | POA: Diagnosis not present

## 2022-06-30 DIAGNOSIS — H52223 Regular astigmatism, bilateral: Secondary | ICD-10-CM | POA: Diagnosis not present

## 2022-06-30 DIAGNOSIS — H524 Presbyopia: Secondary | ICD-10-CM | POA: Diagnosis not present

## 2022-06-30 DIAGNOSIS — Z961 Presence of intraocular lens: Secondary | ICD-10-CM | POA: Diagnosis not present

## 2022-06-30 LAB — HM DIABETES EYE EXAM

## 2022-07-01 ENCOUNTER — Encounter: Payer: Self-pay | Admitting: Internal Medicine

## 2022-07-09 ENCOUNTER — Telehealth: Payer: Self-pay | Admitting: Internal Medicine

## 2022-07-09 MED ORDER — EMPAGLIFLOZIN 10 MG PO TABS: 10.0000 mg | ORAL_TABLET | Freq: Every day | ORAL | 1 refills | Status: DC

## 2022-07-09 NOTE — Telephone Encounter (Signed)
Medication: JARDIANCE 10 MG TABS tablet  Has the patient contacted their pharmacy? Yes.     Preferred Pharmacy: [facility] [place], [place] to [address] [place], [place] [address] Phone: [phone]  Fax: [phone]  Alt phone- [phone] option 2 and ref # [id number]

## 2022-07-09 NOTE — Telephone Encounter (Signed)
Rx sent 

## 2022-07-27 DIAGNOSIS — H1045 Other chronic allergic conjunctivitis: Secondary | ICD-10-CM | POA: Diagnosis not present

## 2022-07-27 DIAGNOSIS — J301 Allergic rhinitis due to pollen: Secondary | ICD-10-CM | POA: Diagnosis not present

## 2022-07-27 DIAGNOSIS — J3089 Other allergic rhinitis: Secondary | ICD-10-CM | POA: Diagnosis not present

## 2022-07-27 DIAGNOSIS — J3081 Allergic rhinitis due to animal (cat) (dog) hair and dander: Secondary | ICD-10-CM | POA: Diagnosis not present

## 2022-07-27 DIAGNOSIS — J4530 Mild persistent asthma, uncomplicated: Secondary | ICD-10-CM | POA: Diagnosis not present

## 2022-08-05 ENCOUNTER — Other Ambulatory Visit: Payer: Self-pay | Admitting: Internal Medicine

## 2022-08-26 ENCOUNTER — Encounter: Payer: Self-pay | Admitting: Internal Medicine

## 2022-08-26 ENCOUNTER — Ambulatory Visit (INDEPENDENT_AMBULATORY_CARE_PROVIDER_SITE_OTHER): Payer: Medicare Other | Admitting: Internal Medicine

## 2022-08-26 VITALS — BP 120/72 | HR 80 | Temp 98.0°F | Resp 18 | Ht 65.0 in | Wt 217.4 lb

## 2022-08-26 DIAGNOSIS — I10 Essential (primary) hypertension: Secondary | ICD-10-CM

## 2022-08-26 DIAGNOSIS — H838X3 Other specified diseases of inner ear, bilateral: Secondary | ICD-10-CM | POA: Diagnosis not present

## 2022-08-26 DIAGNOSIS — H903 Sensorineural hearing loss, bilateral: Secondary | ICD-10-CM | POA: Diagnosis not present

## 2022-08-26 DIAGNOSIS — M109 Gout, unspecified: Secondary | ICD-10-CM | POA: Diagnosis not present

## 2022-08-26 DIAGNOSIS — E1169 Type 2 diabetes mellitus with other specified complication: Secondary | ICD-10-CM | POA: Diagnosis not present

## 2022-08-26 DIAGNOSIS — D509 Iron deficiency anemia, unspecified: Secondary | ICD-10-CM

## 2022-08-26 DIAGNOSIS — R42 Dizziness and giddiness: Secondary | ICD-10-CM | POA: Diagnosis not present

## 2022-08-26 LAB — CBC WITH DIFFERENTIAL/PLATELET
Basophils Absolute: 0 10*3/uL (ref 0.0–0.1)
Basophils Relative: 0.7 % (ref 0.0–3.0)
Eosinophils Absolute: 0.2 10*3/uL (ref 0.0–0.7)
Eosinophils Relative: 2.7 % (ref 0.0–5.0)
HCT: 43.7 % (ref 36.0–46.0)
Hemoglobin: 14.4 g/dL (ref 12.0–15.0)
Lymphocytes Relative: 34.9 % (ref 12.0–46.0)
Lymphs Abs: 2.6 10*3/uL (ref 0.7–4.0)
MCHC: 33 g/dL (ref 30.0–36.0)
MCV: 87 fl (ref 78.0–100.0)
Monocytes Absolute: 0.7 10*3/uL (ref 0.1–1.0)
Monocytes Relative: 9.2 % (ref 3.0–12.0)
Neutro Abs: 3.9 10*3/uL (ref 1.4–7.7)
Neutrophils Relative %: 52.5 % (ref 43.0–77.0)
Platelets: 271 10*3/uL (ref 150.0–400.0)
RBC: 5.02 Mil/uL (ref 3.87–5.11)
RDW: 14.8 % (ref 11.5–15.5)
WBC: 7.4 10*3/uL (ref 4.0–10.5)

## 2022-08-26 LAB — MICROALBUMIN / CREATININE URINE RATIO
Creatinine,U: 90.5 mg/dL
Microalb Creat Ratio: 0.8 mg/g (ref 0.0–30.0)
Microalb, Ur: <0.7 mg/dL (ref 0.0–1.9)

## 2022-08-26 LAB — BASIC METABOLIC PANEL
BUN: 24 mg/dL — ABNORMAL HIGH (ref 6–23)
CO2: 27 mEq/L (ref 19–32)
Calcium: 10.2 mg/dL (ref 8.4–10.5)
Chloride: 102 mEq/L (ref 96–112)
Creatinine, Ser: 1.09 mg/dL (ref 0.40–1.20)
GFR: 48.91 mL/min — ABNORMAL LOW (ref >60.00)
Glucose, Bld: 104 mg/dL — ABNORMAL HIGH (ref 70–99)
Potassium: 4.6 mEq/L (ref 3.5–5.1)
Sodium: 138 mEq/L (ref 135–145)

## 2022-08-26 LAB — FERRITIN: Ferritin: 13 ng/mL (ref 10.0–291.0)

## 2022-08-26 LAB — IRON: Iron: 77 ug/dL (ref 42–145)

## 2022-08-26 LAB — URIC ACID: Uric Acid, Serum: 2.3 mg/dL — ABNORMAL LOW (ref 2.4–7.0)

## 2022-08-26 LAB — HEMOGLOBIN A1C: Hgb A1c MFr Bld: 6.8 % — ABNORMAL HIGH (ref 4.6–6.5)

## 2022-08-26 NOTE — Progress Notes (Signed)
Subjective:    Patient ID: Kristy [name], female    DOB: Jun 21, 1945, 77 y.o.   MRN: [id number]  DOS:  08/26/2022 Type of visit - description: f/u  Today we addressed her chronic medical problems. Saw GI, note reviewed. Ambulatory BPs and CBGs are reportedly very good.  Denies nausea vomiting.  No diarrhea or blood in the stools. Good med compliance.   Review of Systems See above   Past Medical History:  Diagnosis Date   Allergy    Environmental, foods: shellfish, lactose intolerant, citrus, peanuts   Asthma    mild persistent   Atrophic vaginitis    BRCA gene mutation negative 2015   testing done at [facility]   Diabetes mellitus without complication (HCC)    Type 2   DJD (degenerative joint disease)    generalized joint pain, including back   Dysrhythmia    SVT   Family history of malignant neoplasm of breast    Fibromyalgia    Dr Amil Amen   GERD (gastroesophageal reflux disease)    Gout    Idiopathic, chronic   Hiatal hernia    History of kidney stones    Hyperlipidemia    Hypertension    Meniere's disease    R side worse, L side starting, see's Dr. Benjamine Mola.   Palpitations    PSVT on Holter monitoring   Shortness of breath    occ   Stroke [facility])    TIA  2014   SUI (stress urinary incontinence, female)    (resolved after surgery)   TIA (transient ischemic attack)    07/2012, see Dr Leonie Man   Vertigo     Past Surgical History:  Procedure Laterality Date   ABDOMINAL HYSTERECTOMY  1994   TAH,BSO-BLADDER NECK SUSPENSION   BREAST SURGERY     LUMPECTOMY-BENIGN Breast Bx X2   CARPAL TUNNEL RELEASE Right 2005   CATARACT EXTRACTION, BILATERAL Bilateral 2016   Cataract    CHOLECYSTECTOMY, LAPAROSCOPIC  2001   CYSTOCELE REPAIR  1994   EYE SURGERY     JOINT REPLACEMENT     bilatral knees;    KNEE ARTHROPLASTY  05/19/2012   Procedure: COMPUTER ASSISTED TOTAL KNEE ARTHROPLASTY;  Surgeon: Marybelle Killings, MD;  Location: [place];  Service: Orthopedics;  Laterality:  Left;  Left  Total Knee Arthroplasty   KNEE ARTHROPLASTY Right 09/03/2013   Procedure: COMPUTER ASSISTED TOTAL KNEE ARTHROPLASTY;  Surgeon: Marybelle Killings, MD;  Location: [place];  Service: Orthopedics;  Laterality: Right;  Right Total Knee Arthroplasty, Computer Assist   KNEE ARTHROSCOPY     bil   LUMBAR LAMINECTOMY/DECOMPRESSION MICRODISCECTOMY N/A 08/18/2020   Procedure: L4-5 decompression, possible left L4-5 microdiscectomy;  Surgeon: Marybelle Killings, MD;  Location: [place];  Service: Orthopedics;  Laterality: N/A;   OOPHORECTOMY     BSO   SURGERY FOR POSERIOR TIBIAL TENDON  2003   TONSILLECTOMY     TRIPLE FUSION LEFT ANKLE Left 2007   TUBAL LIGATION      Current Outpatient Medications  Medication Instructions   aspirin EC 81 mg, Oral, Daily   atorvastatin (LIPITOR) 10 mg, Oral, Daily at bedtime   Calcium Carbonate (CALCIUM 600 PO) 600 mg, Oral, 2 times daily, (1600 & bedtime)   Cholecalciferol 2,000 Units, Oral, Daily at bedtime   diltiazem (CARDIZEM CD) 240 mg, Oral, Daily   empagliflozin (JARDIANCE) 10 mg, Oral, Daily before breakfast   EPINEPHrine (EPI-PEN) 0.3 mg, See admin instructions   febuxostat (ULORIC) 40 mg,  Oral, Every evening   fluticasone (FLONASE) 50 MCG/ACT nasal spray 2 sprays, Each Nare, Every morning   fluticasone (FLOVENT HFA) 110 MCG/ACT inhaler 2 puffs, Inhalation, Daily   ipratropium (ATROVENT) 0.03 % nasal spray Nasal, 3 times daily PRN   levalbuterol (XOPENEX HFA) 45 MCG/ACT inhaler 2 puffs, Inhalation, As needed   levocetirizine (XYZAL) 5 mg, Oral, Daily-1600   losartan (COZAAR) 25 mg, Oral, Daily   metFORMIN (GLUCOPHAGE) 1,275 mg, Oral, 2 times daily with meals   metoprolol succinate (TOPROL-XL) 25 mg, Oral, Daily, Take with or immediately following a meal.   nortriptyline (PAMELOR) 25 mg, Oral, Daily-1600   omeprazole (PRILOSEC) 40 MG capsule TAKE 1 CAPSULE BY MOUTH EVERY DAY   pioglitazone (ACTOS) 45 mg, Oral, Daily   vitamin E 400 Units, Oral, Daily-1600        Objective:   Physical Exam BP 120/72   Pulse 80   Temp 98 F (36.7 C) (Oral)   Resp 18   Ht 5\' 5"  (1.651 m)   Wt 217 lb 6 oz (98.6 kg)   SpO2 97%   BMI 36.17 kg/m  General:   Well developed, NAD, BMI noted. HEENT:  Normocephalic . Face symmetric, atraumatic Lungs:  CTA B Normal respiratory effort, no intercostal retractions, no accessory muscle use. Heart: RRR,  no murmur.  Lower extremities: no pretibial edema bilaterally  Skin: Not pale. Not jaundice Neurologic:  alert & oriented X3.  Speech normal, gait appropriate for age, assisted by a cane  psych--  Cognition and judgment appear intact.  Cooperative with normal attention span and concentration.  Behavior appropriate. No anxious or depressed appearing.      Assessment     Assessment DM w/ h/o TIA HTN  Hyperlipidemia  Insomnia  Asthma , allergies-- sees Dr Harold Hedge  GERD, hiatal hernia , h/o IBS Obesity  CV: ---Palpitations: PSVT by Holter, Dr Gwenlyn Found ---TIA, dx 2014,last visit w/  Dr. Leonie Man 09-2015, RTC prn ENT: Dr Arnette Norris -Vertigo (improved with vestibular rehabilitation 2015) -HOH- aids, brain  MRI 03/2019.   -Menier's (Dx   2020) MSK: *Osteopenia: dexa per Gyn *Fibromyalgia, DJD, Gout : per Dr. Amil Amen, on [place]  *GOUT *Hyperparathyroidism h/o slt increased PTH 2015 , normal PTH 05-2014  GU:  --Kidney stones, h/o; Urinary incontinence; Atrophic vaginitis OSA dx 08-2021, started CPAP 10/2021 FH breast cancer , sister is BRCA2 (+), pt is  (-) Increased LFTs --- CT abdomen 2014: NormalLiver, negative hepatitis serologies at rheumatology per patient  PLAN: DM: Last A1c was 7.3, pioglitazone increased to 45 mg continue with  Jardiance and metformin.  Reports diet has improved (because her husband and son had to change their diet), unable to exercise much.  Check A1c and micro. HTN: BP is very good, continue Cardizem, losartan, metoprolol.  Check BMP Asthma: Symptoms slightly increased lately, per  Dr. Fredderick Phenix IDA: At the last visit, hemoglobin improving, negative celiac antibodies. Saw GI, note reviewed, no endoscopies indicated at that time  At this point she has no GI symptoms, not taking iron supplements, checking a CBC, iron, ferritin. Gout: Well-controlled, request labs, will check a uric acid.  Forward results to rheumatology. Social: Very busy, her son has a number of medical issues, he moved in with them, has become the caregiver for her husband and son. RTC 4 months

## 2022-08-26 NOTE — Patient Instructions (Addendum)
Please bring [place] a copy of your Healthcare Power of Attorney for your chart.   Continue checking your blood pressure and blood sugars regularly.  BP GOAL is between 110/65 and  135/85. If it is consistently higher or lower, let me know  You can check your sugars at different times, they right times to do it are: - early in AM fasting  ( blood sugar goal 70-130) - 2 hours after a meal (blood sugar goal less than 180)     GO TO THE LAB : Get the blood work     [place], [place] back for a checkup in 4 months

## 2022-08-27 ENCOUNTER — Ambulatory Visit: Payer: Medicare Other | Admitting: Internal Medicine

## 2022-08-27 NOTE — Assessment & Plan Note (Signed)
DM: Last A1c was 7.3, pioglitazone increased to 45 mg continue with  Jardiance and metformin.  Reports diet has improved (because her husband and son had to change their diet), unable to exercise much.  Check A1c and micro. HTN: BP is very good, continue Cardizem, losartan, metoprolol.  Check BMP Asthma: Symptoms slightly increased lately, per Dr. Fredderick Phenix IDA: At the last visit, hemoglobin improving, negative celiac antibodies. Saw GI, note reviewed, no endoscopies indicated at that time  At this point she has no GI symptoms, not taking iron supplements, checking a CBC, iron, ferritin. Gout: Well-controlled, request labs, will check a uric acid.  Forward results to rheumatology. Social: Very busy, her son has a number of medical issues, he moved in with them, has become the caregiver for her husband and son. RTC 4 months

## 2022-10-05 ENCOUNTER — Ambulatory Visit (INDEPENDENT_AMBULATORY_CARE_PROVIDER_SITE_OTHER): Payer: Medicare Other | Admitting: *Deleted

## 2022-10-05 VITALS — BP 114/72 | HR 80 | Ht 65.0 in | Wt 217.4 lb

## 2022-10-05 DIAGNOSIS — Z0000 Encounter for general adult medical examination without abnormal findings: Secondary | ICD-10-CM | POA: Diagnosis not present

## 2022-10-05 NOTE — Patient Instructions (Signed)
Kristy [name] , Thank you for taking time to come for your Medicare Wellness Visit. I appreciate your ongoing commitment to your health goals. Please review the following plan we discussed and let me know if I can assist you in the future.     This is a list of the screening recommended for you and due dates:  Health Maintenance  Topic Date Due   COVID-19 Vaccine (6 - 2023-24 season) 01/27/2023*   Complete foot exam   11/27/2022   Flu Shot  12/30/2022   Hemoglobin A1C  02/26/2023   Mammogram  04/10/2023   Eye exam for diabetics  07/01/2023   Yearly kidney function blood test for diabetes  08/26/2023   Yearly kidney health urinalysis for diabetes  08/26/2023   Medicare Annual Wellness Visit  10/05/2023   Colon Cancer Screening  03/24/2024   DTaP/Tdap/Td vaccine (3 - Td or Tdap) 01/26/2032   Pneumonia Vaccine  Completed   DEXA scan (bone density measurement)  Completed   Hepatitis C Screening: USPSTF Recommendation to screen - Ages 85-79 yo.  Completed   Zoster (Shingles) Vaccine  Completed   HPV Vaccine  Aged Out  *Topic was postponed. The date shown is not the original due date.    Next appointment: Follow up in one year for your annual wellness visit.   Preventive Care 44 Years and Older, Female Preventive care refers to lifestyle choices and visits with your health care provider that can promote health and wellness. What does preventive care include? A yearly physical exam. This is also called an annual well check. Dental exams once or twice a year. Routine eye exams. Ask your health care provider how often you should have your eyes checked. Personal lifestyle choices, including: Daily care of your teeth and gums. Regular physical activity. Eating a healthy diet. Avoiding tobacco and drug use. Limiting alcohol use. Practicing safe sex. Taking low-dose aspirin every day. Taking vitamin and mineral supplements as recommended by your health care provider. What happens during  an annual well check? The services and screenings done by your health care provider during your annual well check will depend on your age, overall health, lifestyle risk factors, and family history of disease. Counseling  Your health care provider may ask you questions about your: Alcohol use. Tobacco use. Drug use. Emotional well-being. Home and relationship well-being. Sexual activity. Eating habits. History of falls. Memory and ability to understand (cognition). Work and work Astronomer. Reproductive health. Screening  You may have the following tests or measurements: Height, weight, and BMI. Blood pressure. Lipid and cholesterol levels. These may be checked every 5 years, or more frequently if you are over 29 years old. Skin check. Lung cancer screening. You may have this screening every year starting at age 34 if you have a 30-pack-year history of smoking and currently smoke or have quit within the past 15 years. Fecal occult blood test (FOBT) of the stool. You may have this test every year starting at age 76. Flexible sigmoidoscopy or colonoscopy. You may have a sigmoidoscopy every 5 years or a colonoscopy every 10 years starting at age 53. Hepatitis C blood test. Hepatitis B blood test. Sexually transmitted disease (STD) testing. Diabetes screening. This is done by checking your blood sugar (glucose) after you have not eaten for a while (fasting). You may have this done every 1-3 years. Bone density scan. This is done to screen for osteoporosis. You may have this done starting at age 40. Mammogram. This may be done  every 1-2 years. Talk to your health care provider about how often you should have regular mammograms. Talk with your health care provider about your test results, treatment options, and if necessary, the need for more tests. Vaccines  Your health care provider may recommend certain vaccines, such as: Influenza vaccine. This is recommended every year. Tetanus,  diphtheria, and acellular pertussis (Tdap, Td) vaccine. You may need a Td booster every 10 years. Zoster vaccine. You may need this after age 50. Pneumococcal 13-valent conjugate (PCV13) vaccine. One dose is recommended after age 6. Pneumococcal polysaccharide (PPSV23) vaccine. One dose is recommended after age 4. Talk to your health care provider about which screenings and vaccines you need and how often you need them. This information is not intended to replace advice given to you by your health care provider. Make sure you discuss any questions you have with your health care provider. Document Released: 06/13/2015 Document Revised: 02/04/2016 Document Reviewed: 03/18/2015 Elsevier Interactive Patient Education  2017 [employer] Prevention in the Home Falls can cause injuries. They can happen to people of all ages. There are many things you can do to make your home safe and to help prevent falls. What can I do on the outside of my home? Regularly fix the edges of walkways and driveways and fix any cracks. Remove anything that might make you trip as you walk through a door, such as a raised step or threshold. Trim any bushes or trees on the path to your home. Use bright outdoor lighting. Clear any walking paths of anything that might make someone trip, such as rocks or tools. Regularly check to see if handrails are loose or broken. Make sure that both sides of any steps have handrails. Any raised decks and porches should have guardrails on the edges. Have any leaves, snow, or ice cleared regularly. Use sand or salt on walking paths during winter. Clean up any spills in your garage right away. This includes oil or grease spills. What can I do in the bathroom? Use night lights. Install grab bars by the toilet and in the tub and shower. Do not use towel bars as grab bars. Use non-skid mats or decals in the tub or shower. If you need to sit down in the shower, use a plastic,  non-slip stool. Keep the floor dry. Clean up any water that spills on the floor as soon as it happens. Remove soap buildup in the tub or shower regularly. Attach bath mats securely with double-sided non-slip rug tape. Do not have throw rugs and other things on the floor that can make you trip. What can I do in the bedroom? Use night lights. Make sure that you have a light by your bed that is easy to reach. Do not use any sheets or blankets that are too big for your bed. They should not hang down onto the floor. Have a firm chair that has side arms. You can use this for support while you get dressed. Do not have throw rugs and other things on the floor that can make you trip. What can I do in the kitchen? Clean up any spills right away. Avoid walking on wet floors. Keep items that you use a lot in easy-to-reach places. If you need to reach something above you, use a strong step stool that has a grab bar. Keep electrical cords out of the way. Do not use floor polish or wax that makes floors slippery. If you must use wax, use  non-skid floor wax. Do not have throw rugs and other things on the floor that can make you trip. What can I do with my stairs? Do not leave any items on the stairs. Make sure that there are handrails on both sides of the stairs and use them. Fix handrails that are broken or loose. Make sure that handrails are as long as the stairways. Check any carpeting to make sure that it is firmly attached to the stairs. Fix any carpet that is loose or worn. Avoid having throw rugs at the top or bottom of the stairs. If you do have throw rugs, attach them to the floor with carpet tape. Make sure that you have a light switch at the top of the stairs and the bottom of the stairs. If you do not have them, ask someone to add them for you. What else can I do to help prevent falls? Wear shoes that: Do not have high heels. Have rubber bottoms. Are comfortable and fit you well. Are closed  at the toe. Do not wear sandals. If you use a stepladder: Make sure that it is fully opened. Do not climb a closed stepladder. Make sure that both sides of the stepladder are locked into place. Ask someone to hold it for you, if possible. Clearly mark and make sure that you can see: Any grab bars or handrails. First and last steps. Where the edge of each step is. Use tools that help you move around (mobility aids) if they are needed. These include: Canes. Walkers. Scooters. Crutches. Turn on the lights when you go into a dark area. Replace any light bulbs as soon as they burn out. Set up your furniture so you have a clear path. Avoid moving your furniture around. If any of your floors are uneven, fix them. If there are any pets around you, be aware of where they are. Review your medicines with your doctor. Some medicines can make you feel dizzy. This can increase your chance of falling. Ask your doctor what other things that you can do to help prevent falls. This information is not intended to replace advice given to you by your health care provider. Make sure you discuss any questions you have with your health care provider. Document Released: 03/13/2009 Document Revised: 10/23/2015 Document Reviewed: 06/21/2014 Elsevier Interactive Patient Education  2017 [employer].

## 2022-10-05 NOTE — Progress Notes (Signed)
Subjective:   Kristy [name] is a 77 y.o. female who presents for Medicare Annual (Subsequent) preventive examination.   Review of Systems     Cardiac Risk Factors include: advanced age (>40men, >6 women);diabetes mellitus;hypertension;dyslipidemia;obesity (BMI >30kg/m2)     Objective:    Today's Vitals   10/05/22 1454  BP: 114/72  Pulse: 80  Weight: 217 lb 6.4 oz (98.6 kg)  Height: 5\' 5"  (1.651 m)   Body mass index is 36.18 kg/m.     10/05/2022    2:54 PM 10/07/2021   12:01 PM 10/01/2021    2:41 PM 09/18/2021    4:28 AM 12/12/2020    1:59 PM 09/25/2020    1:59 PM 08/18/2020   10:53 AM  Advanced Directives  Does Patient Have a Medical Advance Directive? Yes Yes Yes Yes Yes Yes No  Type of Estate agent of [place];Living will Living will;Healthcare Power of [employer] Power of [place];Out of facility DNR (pink MOST or yellow form);Living will Healthcare Power of [place];Living will Healthcare Power of [place];Living will Healthcare Power of [place];Living will   Does patient want to make changes to medical advance directive? No - Patient declined No - Patient declined   No - Patient declined    Copy of Healthcare Power of Attorney in Chart? No - copy requested No - copy requested No - copy requested   No - copy requested   Would patient like information on creating a medical advance directive?       No - Patient declined    Current Medications (verified) Outpatient Encounter Medications as of 10/05/2022  Medication Sig   aspirin EC 81 MG tablet Take 81 mg by mouth daily.   atorvastatin (LIPITOR) 10 MG tablet Take 1 tablet (10 mg total) by mouth at bedtime.   Calcium Carbonate (CALCIUM 600 PO) Take 600 mg by mouth in the morning and at bedtime. (1600 & bedtime)   Cholecalciferol 50 MCG (2000 UT) CAPS Take 2,000 Units by mouth at bedtime.    diltiazem (CARDIZEM CD) 240 MG 24 hr capsule Take 1 capsule (240 mg total) by mouth daily.   empagliflozin  (JARDIANCE) 10 MG TABS tablet Take 1 tablet (10 mg total) by mouth daily before breakfast.   EPINEPHrine (EPI-PEN) 0.3 mg/0.3 mL DEVI Inject 0.3 mg into the muscle See admin instructions. Inject 0.3 mg intramuscularly in the leg as needed for severe allergic reaction, call 911 and then inject again in the other leg. Reported on 06/26/2015 (Patient not taking: Reported on 04/21/2022)   febuxostat (ULORIC) 40 MG tablet Take 40 mg by mouth every evening.    fluticasone (FLONASE) 50 MCG/ACT nasal spray Place 2 sprays into both nostrils in the morning.   fluticasone (FLOVENT HFA) 110 MCG/ACT inhaler Inhale 2 puffs into the lungs daily.   ipratropium (ATROVENT) 0.03 % nasal spray Place into the nose 3 (three) times daily as needed.   levalbuterol (XOPENEX HFA) 45 MCG/ACT inhaler Inhale 2 puffs into the lungs as needed for shortness of breath.   levocetirizine (XYZAL) 5 MG tablet Take 5 mg by mouth daily at 4 PM.   losartan (COZAAR) 25 MG tablet TAKE 1 TABLET (25 MG TOTAL) BY MOUTH DAILY.   metFORMIN (GLUCOPHAGE) 850 MG tablet Take 1.5 tablets (1,275 mg total) by mouth 2 (two) times daily with a meal.   metoprolol succinate (TOPROL-XL) 25 MG 24 hr tablet Take 1 tablet (25 mg total) by mouth daily. Take with or immediately following a meal.  nortriptyline (PAMELOR) 25 MG capsule Take 25 mg by mouth daily at 4 PM.   omeprazole (PRILOSEC) 40 MG capsule TAKE 1 CAPSULE BY MOUTH EVERY DAY   pioglitazone (ACTOS) 45 MG tablet Take 1 tablet (45 mg total) by mouth daily.   vitamin E 400 UNIT capsule Take 400 Units by mouth daily at 4 PM.   No facility-administered encounter medications on file as of 10/05/2022.    Allergies (verified) Iodine, Shellfish allergy, Sulfonamide derivatives, Cephalexin, Citrus, Lactose intolerance (gi), and Amoxicillin   History: Past Medical History:  Diagnosis Date   Allergy    Environmental, foods: shellfish, lactose intolerant, citrus, peanuts   Asthma    mild persistent    Atrophic vaginitis    BRCA gene mutation negative 2015   testing done at [place]   Cataract    had corrective cataract surgery both eyes   Diabetes mellitus without complication (HCC)    Type 2   DJD (degenerative joint disease)    generalized joint pain, including back   Dysrhythmia    SVT   Family history of malignant neoplasm of breast    Fibromyalgia    Dr Dierdre Forth   GERD (gastroesophageal reflux disease)    Gout    Idiopathic, chronic   Hiatal hernia    History of kidney stones    Hyperlipidemia    Hypertension    Meniere's disease    R side worse, L side starting, see's Dr. Suszanne Conners.   Palpitations    PSVT on Holter monitoring   Shortness of breath    occ   Stroke [facility])    TIA  2014   SUI (stress urinary incontinence, female)    (resolved after surgery)   TIA (transient ischemic attack)    07/2012, see Dr Pearlean Brownie   Vertigo    Past Surgical History:  Procedure Laterality Date   ABDOMINAL HYSTERECTOMY  1994   TAH,BSO-BLADDER NECK SUSPENSION   BREAST SURGERY     LUMPECTOMY-BENIGN Breast Bx X2   CARPAL TUNNEL RELEASE Right 2005   CATARACT EXTRACTION, BILATERAL Bilateral 2016   Cataract    CHOLECYSTECTOMY     CHOLECYSTECTOMY, LAPAROSCOPIC  2001   CYSTOCELE REPAIR  1994   EYE SURGERY     JOINT REPLACEMENT     bilatral knees;    KNEE ARTHROPLASTY  05/19/2012   Procedure: COMPUTER ASSISTED TOTAL KNEE ARTHROPLASTY;  Surgeon: Eldred Manges, MD;  Location: [facility] OR;  Service: Orthopedics;  Laterality: Left;  Left  Total Knee Arthroplasty   KNEE ARTHROPLASTY Right 09/03/2013   Procedure: COMPUTER ASSISTED TOTAL KNEE ARTHROPLASTY;  Surgeon: Eldred Manges, MD;  Location: [facility] OR;  Service: Orthopedics;  Laterality: Right;  Right Total Knee Arthroplasty, Computer Assist   KNEE ARTHROSCOPY     bil   LUMBAR LAMINECTOMY/DECOMPRESSION MICRODISCECTOMY N/A 08/18/2020   Procedure: L4-5 decompression, possible left L4-5 microdiscectomy;  Surgeon: Eldred Manges, MD;  Location[facility];   Service: Orthopedics;  Laterality: N/A;   OOPHORECTOMY     BSO   SPINE SURGERY  07/2020   SURGERY FOR POSERIOR TIBIAL TENDON  2003   TONSILLECTOMY     TRIPLE FUSION LEFT ANKLE Left 2007   TUBAL LIGATION     Family History  Problem Relation Age of Onset   Breast cancer Mother 54       deceased 49   Arthritis Mother    Cancer Mother    Cancer Father        Brain tumor; path?;  deceased 75   Breast cancer Sister 22       bilateral breast ca @ 62; BRCA2 positive   Hypertension Sister    Cancer Sister    Hemochromatosis Sister    Thalassemia Sister    Other Sister        negative for BRCA1 BRCA2   Birth defects Sister    Cancer Sister    Heart failure Sister        due to chemo?   Lymphoma Maternal Aunt        deceased 67s   Cancer Maternal Uncle    CAD Son        MI   Diabetes Son    Leukemia Son    Breast cancer Cousin        BRCA2 positive   Diabetes Other    Breast cancer Other        distant maternal female relatives with breast cancer   Diabetes Maternal Grandmother    Cancer Son    Diabetes Son    Colon cancer Neg Hx    Colon polyps Neg Hx    Esophageal cancer Neg Hx    Rectal cancer Neg Hx    Stomach cancer Neg Hx    Social History   Socioeconomic History   Marital status: Married    Spouse name: Dorene Sorrow   Number of children: 2   Years of education: 16   Highest education level: Some college, no degree  Occupational History   Occupation: Location manager-- retired     Associate Professor: RETIRED  Tobacco Use   Smoking status: Never   Smokeless tobacco: Never  Vaping Use   Vaping Use: Never used  Substance and Sexual Activity   Alcohol use: No    Alcohol/week: 0.0 standard drinks of alcohol   Drug use: No   Sexual activity: Not Currently    Birth control/protection: Surgical  Other Topics Concern   Not on file  Social History Narrative   HSG   1 year college for accounting. married '69. 2 sons - '70, '72: retired - [employer].     ACP - OK for CPR at least once,  no long term ventilation, no heroic measures in the face of poor quality of life. HCPOA - husband, secondary son Lareka Boffa (c) [phone]   No Cafffeine (or very rare).    Right Handed       Social Determinants of Health   Financial Resource Strain: Low Risk  (08/19/2022)   Overall Financial Resource Strain (CARDIA)    Difficulty of Paying Living Expenses: Not hard at all  Food Insecurity: No Food Insecurity (08/19/2022)   Hunger Vital Sign    Worried About Running Out of Food in the Last Year: Never true    Ran Out of Food in the Last Year: Never true  Transportation Needs: No Transportation Needs (08/19/2022)   PRAPARE - Administrator, Civil Service (Medical): No    Lack of Transportation (Non-Medical): No  Physical Activity: Insufficiently Active (08/19/2022)   Exercise Vital Sign    Days of Exercise per Week: 3 days    Minutes of Exercise per Session: 20 min  Stress: No Stress Concern Present (08/19/2022)   [employer] of Occupational Health - Occupational Stress Questionnaire    Feeling of Stress : Not at all  Social Connections: Moderately Integrated (08/19/2022)   Social Connection and Isolation Panel [NHANES]    Frequency of Communication with Friends and Family: More than three  times a week    Frequency of Social Gatherings with Friends and Family: More than three times a week    Attends Religious Services: 1 to 4 times per year    Active Member of [employer] or Organizations: No    Attends Engineer, structural: Not on file    Marital Status: Married    Tobacco Counseling Counseling given: Not Answered   Clinical Intake:  Pre-visit preparation completed: Yes  Pain : No/denies pain  BMI - recorded: 36.18 Nutritional Status: BMI > 30  Obese Nutritional Risks: None Diabetes: Yes CBG done?: No Did pt. bring in CBG monitor from home?: No  How often do you need to have someone help you when you read instructions, pamphlets, or other written  materials from your doctor or pharmacy?: 1 - Never  Activities of Daily Living    10/05/2022    3:05 PM  In your present state of health, do you have any difficulty performing the following activities:  Hearing? 1  Comment wears hearing aids  Vision? 0  Difficulty concentrating or making decisions? 0  Walking or climbing stairs? 0  Dressing or bathing? 0  Doing errands, shopping? 0  Preparing Food and eating ? N  Using the Toilet? N  In the past six months, have you accidently leaked urine? Y  Do you have problems with loss of bowel control? N  Managing your Medications? N  Managing your Finances? N  Housekeeping or managing your Housekeeping? N    Patient Care Team: Wanda Plump, MD as PCP - General (Internal Medicine) Little Ishikawa, MD as PCP - Cardiology (Cardiology) Donnetta Hail, MD (Rheumatology) Runell Gess, MD as Consulting Physician (Cardiology) Elmer Picker, Counselor (Inactive) (Genetic Counselor) Micki Riley, MD as Consulting Physician (Neurology) Irena Cords, Enzo Montgomery, MD as Consulting Physician (Allergy and Immunology) Newman Pies, MD as Consulting Physician (Otolaryngology)  Indicate any recent Medical Services you may have received from other than [facility] providers in the past year (date may be approximate).     Assessment:   This is a routine wellness examination for Lillianne.  Hearing/Vision screen No results found.  Dietary issues and exercise activities discussed: Current Exercise Habits: The patient does not participate in regular exercise at present, Exercise limited by: orthopedic condition(s)   Goals Addressed   None    Depression Screen    10/05/2022    3:03 PM 08/26/2022   10:18 AM 03/29/2022   10:33 AM 10/01/2021    2:44 PM 07/15/2021   10:55 AM 01/09/2021    8:58 AM 09/25/2020    2:01 PM  PHQ 2/9 Scores  PHQ - 2 Score 2 0 0 0 0 0 0    Fall Risk    10/05/2022    3:10 PM 08/26/2022   10:18 AM 03/29/2022   10:33 AM 10/01/2021     2:43 PM 07/15/2021   10:55 AM  Fall Risk   Falls in the past year? 1 1 1 1 1   Number falls in past yr: 1 1 0 1 0  Injury with Fall? 0 0 0 1 0  Risk for fall due to : Impaired balance/gait;History of fall(s)   History of fall(s)   Follow up Falls evaluation completed;Falls prevention discussed Falls evaluation completed Falls evaluation completed Falls evaluation completed Falls evaluation completed    FALL RISK PREVENTION PERTAINING TO THE HOME:  Any stairs in or around the home? No  Home free of loose throw rugs in  walkways, pet beds, electrical cords, etc? Yes  Adequate lighting in your home to reduce risk of falls? Yes   ASSISTIVE DEVICES UTILIZED TO PREVENT FALLS:  Life alert? Yes  Use of a cane, walker or w/c? Yes  Grab bars in the bathroom? Yes  Shower chair or bench in shower? Yes  Elevated toilet seat or a handicapped toilet? Yes   TIMED UP AND GO:  Was the test performed? Yes .  Length of time to ambulate 10 feet: 7 sec.   Gait steady and fast with assistive device  Cognitive Function:        10/05/2022    3:14 PM 10/01/2021    2:55 PM 09/25/2020    2:07 PM  6CIT Screen  What Year? 0 points 0 points 0 points  What month? 0 points 0 points 0 points  What time? 0 points 0 points 0 points  Count back from 20 0 points 0 points 0 points  Months in reverse 0 points 0 points 0 points  Repeat phrase 0 points 0 points 0 points  Total Score 0 points 0 points 0 points    Immunizations Immunization History  Administered Date(s) Administered   Fluad Quad(high Dose 65+) 02/16/2019, 03/19/2020   Influenza Whole 02/28/2009, 02/20/2010, 04/26/2012   Influenza, High Dose Seasonal PF 02/11/2015, 03/30/2016, 03/28/2017, 03/09/2018   Influenza,inj,Quad PF,6+ Mos 02/19/2014, 03/09/2021   Influenza-Unspecified 04/20/2013, 03/26/2022   Moderna Sars-Covid-2 Vaccination 07/13/2019, 08/10/2019, 03/29/2020   Pfizer Covid-19 Vaccine Bivalent Booster 80yrs & up 02/13/2021,  02/20/2022   Pneumococcal Conjugate-13 06/05/2013   Pneumococcal Polysaccharide-23 02/20/2010, 07/11/2020, 04/02/2021   Respiratory Syncytial Virus Vaccine,Recomb Aduvanted(Arexvy) 02/20/2022   Tdap 05/05/2011, 01/25/2022   Zoster Recombinat (Shingrix) 01/25/2022, 03/26/2022    TDAP status: Up to date  Flu Vaccine status: Up to date  Pneumococcal vaccine status: Up to date  Covid-19 vaccine status: Information provided on how to obtain vaccines.   Qualifies for Shingles Vaccine? Yes   Zostavax completed No   Shingrix Completed?: Yes  Screening Tests Health Maintenance  Topic Date Due   Medicare Annual Wellness (AWV)  10/02/2022   COVID-19 Vaccine (6 - 2023-24 season) 01/27/2023 (Originally 04/17/2022)   FOOT EXAM  11/27/2022   INFLUENZA VACCINE  12/30/2022   HEMOGLOBIN A1C  02/26/2023   MAMMOGRAM  04/10/2023   OPHTHALMOLOGY EXAM  07/01/2023   Diabetic kidney evaluation - eGFR measurement  08/26/2023   Diabetic kidney evaluation - Urine ACR  08/26/2023   COLONOSCOPY (Pts 45-65yrs Insurance coverage will need to be confirmed)  03/24/2024   DTaP/Tdap/Td (3 - Td or Tdap) 01/26/2032   Pneumonia Vaccine 65+ Years old  Completed   DEXA SCAN  Completed   Hepatitis C Screening  Completed   Zoster Vaccines- Shingrix  Completed   HPV VACCINES  Aged Out    Health Maintenance  Health Maintenance Due  Topic Date Due   Medicare Annual Wellness (AWV)  10/02/2022    Colorectal cancer screening: Type of screening: Colonoscopy. Completed 03/24/21. Repeat every 3 years  Mammogram status: Completed 04/09/22. Repeat every year  Bone Density status: Completed 01/02/19. Results reflect: Bone density results: OSTEOPENIA. Repeat every 2 years.  Lung Cancer Screening: (Low Dose CT Chest recommended if Age 69-80 years, 30 pack-year currently smoking OR have quit w/in 15years.) does not qualify.   Additional Screening:  Hepatitis C Screening: does qualify; Completed 06/11/15  Vision  Screening: Recommended annual ophthalmology exams for early detection of glaucoma and other disorders of the eye. Is the patient  up to date with their annual eye exam?  Yes  Who is the provider or what is the name of the office in which the patient attends annual eye exams? Dr. Blima Ledger If pt is not established with a provider, would they like to be referred to a provider to establish care? No .   Dental Screening: Recommended annual dental exams for proper oral hygiene  Community Resource Referral / Chronic Care Management: CRR required this visit?  No   CCM required this visit?  No      Plan:     I have personally reviewed and noted the following in the patient's chart:   Medical and social history Use of alcohol, tobacco or illicit drugs  Current medications and supplements including opioid prescriptions. Patient is not currently taking opioid prescriptions. Functional ability and status Nutritional status Physical activity Advanced directives List of other physicians Hospitalizations, surgeries, and ER visits in previous 12 months Vitals Screenings to include cognitive, depression, and falls Referrals and appointments  In addition, I have reviewed and discussed with patient certain preventive protocols, quality metrics, and best practice recommendations. A written personalized care plan for preventive services as well as general preventive health recommendations were provided to patient.     Donne Anon, [place]   10/05/2022   Nurse Notes: None

## 2022-10-17 ENCOUNTER — Other Ambulatory Visit: Payer: Self-pay | Admitting: Internal Medicine

## 2022-10-28 DIAGNOSIS — M1A09X0 Idiopathic chronic gout, multiple sites, without tophus (tophi): Secondary | ICD-10-CM | POA: Diagnosis not present

## 2022-10-28 DIAGNOSIS — M797 Fibromyalgia: Secondary | ICD-10-CM | POA: Diagnosis not present

## 2022-10-28 DIAGNOSIS — M1991 Primary osteoarthritis, unspecified site: Secondary | ICD-10-CM | POA: Diagnosis not present

## 2022-10-28 DIAGNOSIS — M5136 Other intervertebral disc degeneration, lumbar region: Secondary | ICD-10-CM | POA: Diagnosis not present

## 2022-10-28 DIAGNOSIS — R945 Abnormal results of liver function studies: Secondary | ICD-10-CM | POA: Diagnosis not present

## 2022-10-28 DIAGNOSIS — M65321 Trigger finger, right index finger: Secondary | ICD-10-CM | POA: Diagnosis not present

## 2022-10-28 DIAGNOSIS — Z6836 Body mass index (BMI) 36.0-36.9, adult: Secondary | ICD-10-CM | POA: Diagnosis not present

## 2022-10-28 DIAGNOSIS — E669 Obesity, unspecified: Secondary | ICD-10-CM | POA: Diagnosis not present

## 2022-10-30 ENCOUNTER — Other Ambulatory Visit: Payer: Self-pay | Admitting: Internal Medicine

## 2022-11-02 ENCOUNTER — Other Ambulatory Visit: Payer: Self-pay | Admitting: Internal Medicine

## 2022-11-04 ENCOUNTER — Other Ambulatory Visit: Payer: Self-pay | Admitting: Internal Medicine

## 2022-11-08 ENCOUNTER — Other Ambulatory Visit: Payer: Self-pay | Admitting: Internal Medicine

## 2022-12-20 ENCOUNTER — Ambulatory Visit: Payer: Medicare Other | Admitting: Family Medicine

## 2022-12-22 ENCOUNTER — Other Ambulatory Visit: Payer: Self-pay | Admitting: Internal Medicine

## 2022-12-27 ENCOUNTER — Encounter: Payer: Self-pay | Admitting: Internal Medicine

## 2022-12-27 ENCOUNTER — Ambulatory Visit (INDEPENDENT_AMBULATORY_CARE_PROVIDER_SITE_OTHER): Payer: Medicare Other | Admitting: Internal Medicine

## 2022-12-27 ENCOUNTER — Other Ambulatory Visit: Payer: Self-pay | Admitting: Internal Medicine

## 2022-12-27 VITALS — BP 122/68 | HR 83 | Temp 97.7°F | Resp 18 | Ht 65.0 in | Wt 226.0 lb

## 2022-12-27 DIAGNOSIS — E1169 Type 2 diabetes mellitus with other specified complication: Secondary | ICD-10-CM | POA: Diagnosis not present

## 2022-12-27 DIAGNOSIS — Z7984 Long term (current) use of oral hypoglycemic drugs: Secondary | ICD-10-CM

## 2022-12-27 DIAGNOSIS — E7800 Pure hypercholesterolemia, unspecified: Secondary | ICD-10-CM

## 2022-12-27 DIAGNOSIS — I10 Essential (primary) hypertension: Secondary | ICD-10-CM

## 2022-12-27 NOTE — Telephone Encounter (Signed)
Awaiting A1c result.

## 2022-12-27 NOTE — Patient Instructions (Addendum)
Vaccines I recommend:  Flu shot this fall COVID booster  Please bring or send [place] a copy of your Healthcare Power of Attorney for your chart.   Check the  blood pressure regularly BP GOAL is between 110/65 and  135/85. If it is consistently higher or lower, let me know      GO TO THE LAB : Get the blood work     GO TO THE FRONT DESK, PLEASE SCHEDULE YOUR APPOINTMENTS Come back for   checkup in 4 to 5 months

## 2022-12-27 NOTE — Progress Notes (Unsigned)
Subjective:    Patient ID: Kristy [name], female    DOB: March 02, 1946, 77 y.o.   MRN: [id number]  DOS:  12/27/2022 Type of visit - description: ROV since  Last office visit is doing okay. Normal ambulatory BPs Ambulatory CBGs are great   Review of Systems See above   Past Medical History:  Diagnosis Date   Allergy    Environmental, foods: shellfish, lactose intolerant, citrus, peanuts   Asthma    mild persistent   Atrophic vaginitis    BRCA gene mutation negative 2015   testing done at [facility]   Cataract    had corrective cataract surgery both eyes   Diabetes mellitus without complication (HCC)    Type 2   DJD (degenerative joint disease)    generalized joint pain, including back   Dysrhythmia    SVT   Family history of malignant neoplasm of breast    Fibromyalgia    Dr Dierdre Forth   GERD (gastroesophageal reflux disease)    Gout    Idiopathic, chronic   Hiatal hernia    History of kidney stones    Hyperlipidemia    Hypertension    Meniere's disease    R side worse, L side starting, see's Dr. Suszanne Conners.   Palpitations    PSVT on Holter monitoring   Shortness of breath    occ   Stroke [facility])    TIA  2014   SUI (stress urinary incontinence, female)    (resolved after surgery)   TIA (transient ischemic attack)    07/2012, see Dr Pearlean Brownie   Vertigo     Past Surgical History:  Procedure Laterality Date   ABDOMINAL HYSTERECTOMY  1994   TAH,BSO-BLADDER NECK SUSPENSION   BREAST SURGERY     LUMPECTOMY-BENIGN Breast Bx X2   CARPAL TUNNEL RELEASE Right 2005   CATARACT EXTRACTION, BILATERAL Bilateral 2016   Cataract    CHOLECYSTECTOMY     CHOLECYSTECTOMY, LAPAROSCOPIC  2001   CYSTOCELE REPAIR  1994   EYE SURGERY     JOINT REPLACEMENT     bilatral knees;    KNEE ARTHROPLASTY  05/19/2012   Procedure: COMPUTER ASSISTED TOTAL KNEE ARTHROPLASTY;  Surgeon: Eldred Manges, MD;  Location: [facility] OR;  Service: Orthopedics;  Laterality: Left;  Left  Total Knee Arthroplasty   KNEE  ARTHROPLASTY Right 09/03/2013   Procedure: COMPUTER ASSISTED TOTAL KNEE ARTHROPLASTY;  Surgeon: Eldred Manges, MD;  Location: [facility] OR;  Service: Orthopedics;  Laterality: Right;  Right Total Knee Arthroplasty, Computer Assist   KNEE ARTHROSCOPY     bil   LUMBAR LAMINECTOMY/DECOMPRESSION MICRODISCECTOMY N/A 08/18/2020   Procedure: L4-5 decompression, possible left L4-5 microdiscectomy;  Surgeon: Eldred Manges, MD;  Location: [facility] OR;  Service: Orthopedics;  Laterality: N/A;   OOPHORECTOMY     BSO   SPINE SURGERY  07/2020   SURGERY FOR POSERIOR TIBIAL TENDON  2003   TONSILLECTOMY     TRIPLE FUSION LEFT ANKLE Left 2007   TUBAL LIGATION      Current Outpatient Medications  Medication Instructions   aspirin EC 81 mg, Oral, Daily   atorvastatin (LIPITOR) 10 MG tablet TAKE 1 TABLET BY MOUTH EVERYDAY AT BEDTIME   Calcium Carbonate (CALCIUM 600 PO) 600 mg, Oral, 2 times daily, (1600 & bedtime)   Cholecalciferol 2,000 Units, Oral, Daily at bedtime   diltiazem (CARDIZEM CD) 240 mg, Oral, Daily   empagliflozin (JARDIANCE) 10 mg, Oral, Daily before breakfast   EPINEPHrine (EPI-PEN) 0.3 mg, See  admin instructions   febuxostat (ULORIC) 40 mg, Oral, Every evening   fluticasone (FLONASE) 50 MCG/ACT nasal spray 2 sprays, Each Nare, Every morning   fluticasone (FLOVENT HFA) 110 MCG/ACT inhaler 2 puffs, Inhalation, Daily   ipratropium (ATROVENT) 0.03 % nasal spray Nasal, 3 times daily PRN   levalbuterol (XOPENEX HFA) 45 MCG/ACT inhaler 2 puffs, Inhalation, As needed   levocetirizine (XYZAL) 5 mg, Oral, Daily-1600   losartan (COZAAR) 25 mg, Oral, Daily   metFORMIN (GLUCOPHAGE) 1,275 mg, Oral, 2 times daily with meals   metoprolol succinate (TOPROL-XL) 25 mg, Oral, Daily, TAKE WITH OR IMMEDIATELY FOLLOWING A MEAL.   nortriptyline (PAMELOR) 25 mg, Oral, Daily-1600   omeprazole (PRILOSEC) 40 mg, Oral, Daily   pioglitazone (ACTOS) 45 mg, Oral, Daily   vitamin E 400 Units, Oral, Daily-1600       Objective:    Physical Exam BP 122/68   Pulse 83   Temp 97.7 F (36.5 C) (Oral)   Resp 18   Ht 5\' 5"  (1.651 m)   Wt 226 lb (102.5 kg)   SpO2 95%   BMI 37.61 kg/m  General:   Well developed, NAD, BMI noted. HEENT:  Normocephalic . Face symmetric, atraumatic Lungs:  CTA B Normal respiratory effort, no intercostal retractions, no accessory muscle use. Heart: RRR,  no murmur.  DM foot exam: No edema, good pedal pulses, pinprick examination normal Skin: Not pale. Not jaundice Neurologic:  alert & oriented X3.  Speech normal, gait appropriate for age and unassisted Psych--  Cognition and judgment appear intact.  Cooperative with normal attention span and concentration.  Behavior appropriate. No anxious or depressed appearing.      Assessment   Assessment DM w/ h/o TIA HTN  Hyperlipidemia  Insomnia  Asthma , allergies-- sees Dr Irena Cords  GERD, hiatal hernia , h/o IBS Obesity  CV: ---Palpitations: PSVT by Holter, Dr Allyson Sabal ---TIA, dx 2014,last visit w/  Dr. Pearlean Brownie 09-2015, RTC prn ENT: Dr Danice Goltz -Vertigo (improved with vestibular rehabilitation 2015) -HOH- aids, brain  MRI 03/2019.   -Menier's (Dx   2020) MSK: *Osteopenia: dexa per Gyn *Fibromyalgia, DJD, Gout : per Dr. Dierdre Forth, on [place]or  *GOUT *Hyperparathyroidism h/o slt increased PTH 2015 , normal PTH 05-2014  GU:  --Kidney stones, h/o; Urinary incontinence; Atrophic vaginitis OSA dx 08-2021, started CPAP 10/2021 FH breast cancer , sister is BRCA2 (+), pt is  (-) Increased LFTs --- CT abdomen 2014: NormalLiver, negative hepatitis serologies at rheumatology per patient  PLAN: DM, good compliance with Jardiance, metformin, pioglitazone, last A1c was improving, reports normal ambulatory CBGs, never more than 120. Feet exam negative. Plan: A1c, continue present care. HTN: Reports normal ambulatory BPs, BP today is very good, continue diltiazem, losartan, metoprolol.  Check a BMP. High cholesterol: cont Atorvastatin, check FLP,  last LFTs normal, further laboratory results. Preventive care: Vaccine advice provided [facility] 03-2022. RTC 4 to 5 months       DM: Last A1c was 7.3, pioglitazone increased to 45 mg continue with  Jardiance and metformin.  Reports diet has improved (because her husband and son had to change their diet), unable to exercise much.  Check A1c and micro. HTN: BP is very good, continue Cardizem, losartan, metoprolol.  Check BMP Asthma: Symptoms slightly increased lately, per Dr. Madie Reno IDA: At the last visit, hemoglobin improving, negative celiac antibodies. Saw GI, note reviewed, no endoscopies indicated at that time  At this point she has no GI symptoms, not taking iron supplements, checking a CBC, iron,  ferritin. Gout: Well-controlled, request labs, will check a uric acid.  Forward results to rheumatology. Social: Very busy, her son has a number of medical issues, he moved in with them, has become the caregiver for her husband and son. RTC 4 months

## 2022-12-28 NOTE — Telephone Encounter (Signed)
Ok RF 6 months  

## 2022-12-28 NOTE — Telephone Encounter (Signed)
A1c was 6.8% yesterday. Okay to refill?

## 2022-12-28 NOTE — Assessment & Plan Note (Signed)
DM, good compliance with Jardiance, metformin, pioglitazone, last A1c was improving, reports normal ambulatory CBGs, never more than 120. Feet exam negative. Plan: A1c, continue present care. HTN: Reports normal ambulatory BPs, BP today is very good, continue diltiazem, losartan, metoprolol.  Check a BMP. High cholesterol: cont Atorvastatin, check FLP, last LFTs normal, further advised with results. Preventive care: Vaccine advice provided [facility]G 03-2022. RTC 4 to 5 months

## 2023-02-24 DIAGNOSIS — H903 Sensorineural hearing loss, bilateral: Secondary | ICD-10-CM | POA: Diagnosis not present

## 2023-02-24 DIAGNOSIS — R42 Dizziness and giddiness: Secondary | ICD-10-CM | POA: Diagnosis not present

## 2023-02-24 DIAGNOSIS — H838X3 Other specified diseases of inner ear, bilateral: Secondary | ICD-10-CM | POA: Diagnosis not present

## 2023-03-01 ENCOUNTER — Other Ambulatory Visit: Payer: Self-pay | Admitting: Internal Medicine

## 2023-03-03 ENCOUNTER — Other Ambulatory Visit: Payer: Self-pay | Admitting: Internal Medicine

## 2023-04-07 NOTE — Patient Instructions (Signed)

## 2023-04-07 NOTE — Progress Notes (Signed)
PATIENT: Kristy [name] DOB: 24-Dec-1945  REASON FOR VISIT: follow up HISTORY FROM: patient  Chief Complaint  Patient presents with   Follow-up    Pt in room 1 alone. Here for cpap follow up. Pt reports doing well, wear nightly at least 4 hours.      HISTORY OF PRESENT ILLNESS:  04/13/23 ALL:  Kristy [name] is a 77 y.o. female here today for follow up for OSA on CPAP. She continues to do well on therapy. She endorses more GERD symptoms which has limited her use over the past few weeks. She occasionally notes an air leak. She is due for replacement mask. Using F30 FFM. She denies concerns with machine. She sleeps fairly well, most nights. She is primary caregiver for her husband with end stage liver disease and her son with leukemia.     HISTORY: (copied from Dr Teofilo Pod previous note)  [name] is a 77 year old right-handed woman with an underlying medical history of TIA, stroke, dizziness (seen by Dr. Pearlean Brownie in the past), carotid artery stenosis, asthma, allergies, diabetes, degenerative joint disease, fibromyalgia, vertigo, reflux disease, gout, history of kidney stones, hypertension, hyperlipidemia, palpitations with history of PSVT, and obesity, who presents for follow-up consultation of her obstructive sleep apnea, after her interim CPAP titration study. The patient is unaccompanied today.  I last saw her on 12/16/21, at which time she was compliant with her AutoPap.  She had adjusted well to treatment.  We proceeded with an overnight pulse oximetry test.   She had an overnight pulse oximetry test on 12/21/2021 through 12/22/2021.  Patient was on her PAP machine and duration of test was 7 hours and 19 minutes.  Average oxygen saturation was 91.8%, nadir was 81%, time below or at 88% saturation was 16 minutes and 49 seconds.  She had just a handful of desaturations below 85%, several desaturations into the higher in mid 80s.  I recommended that patient come in for a proper CPAP titration  study.    She had a CPAP titration study on 01/11/2022.  She did well with CPAP of 16 cm.  We proceeded to change her from AutoPap to CPAP at home.  She was originally placed on home CPAP of 16 cm but in September 2023 we reduce the pressure from 16 to 14 cm as the pressure was too high for her to tolerate.    Today, 04/21/2022: I reviewed her CPAP compliance data from 03/21/2022 through 04/19/2022, which is a total of 30 days, during which time she used her machine every night with the exception of 1.  Percent use days greater than 4 hours was 97%, indicating excellent compliance with an average usage of 6 hours and 54 minutes, residual AHI at goal at 3.1/h, pressure of 14 cm with EPR of 3.  Leak acceptable with the 95th percentile at 15.7 mg/min.  She reports doing well with her CPAP, she is adjusted to treatment, started off with a F20 fullface mask but broke out in a rash, and also pinched at the nasal bridge, the medium was too big and she switched to a small.  Subsequently she switched to a F30 fullface mask from [employer] which is under the nose and mouth but still had issues with a rash, she may be allergic to silicone as she also sometimes breaks out in a rash when she wears her Apple Watch.  She is very motivated to continue with treatment, she had to skip at night because her husband  had to be hospitalized at night.  She is under stress, he was in the ICU and is still in the hospital, he may be able to be discharged today, she is going to go to the hospital straight from here.  She also worries about her son who has complications of diabetes.    The patient's allergies, current medications, family history, past medical history, past social history, past surgical history and problem list were reviewed and updated as appropriate.    Previously (copied from previous notes for reference):    I first met her at the request of her primary care physician on 09/15/2021, at which time she reported snoring and  excessive daytime somnolence.  She was advised to proceed with a sleep study.  She had a baseline sleep study on 09/21/2021 which showed a sleep efficiency of 75.3%, sleep latency delayed at 41.5 minutes and REM latency delayed at 169 minutes.  She had mild to moderate sleep fragmentation with wake after sleep onset at 76.5 minutes.  She had a decreased percentage of REM sleep at 9.1%.  Total AHI based on hypopneas was 11.5/h, REM AHI 68/h, supine AHI 16.5/h.  Average oxygen saturation was 94%, nadir was 84%.  Time below 89% saturation was 62 minutes for the night.  She did get started on supplemental oxygen at 1 L/min as she had lower average oxygen saturations around 87% during sleep.  Her sleep related average oxygen saturations improved to 90% after supplemental oxygen.  Based on her test results she was advised to start AutoPap therapy.  Her set up date was 11/03/2021.  She has a [employer] air sense 11 AutoSet machine.   I reviewed her AutoPap compliance data from 11/15/2021 through 12/14/2021, which is a total of 30 days, during which time she used her machine 29 days with percent use days greater than 4 hours at 97%, indicating excellent compliance with an average usage of 7 hours and 22 minutes, residual AHI borderline at 4.8/h, average pressure for the 95th percentile at 9.9 cm with a range of 5 to 11 cm, leak acceptable with the 95th percentile at 9.2 L/min.     09/15/21: (She) reports snoring and excessive daytime somnolence. I reviewed your office note from 07/15/2021. Her Epworth sleepiness score is 20 out of 24, fatigue severity score is 63 out of 63.  Symptoms have become worse over time.  She has had intermittent vertigo and has seen Dr. Suszanne Conners.  She goes to bed generally around 10:30 AM and rise time is around 9 AM.  Some nights she has a hard time sleeping, and some nights she sleeps a lot.  She has nocturia about twice per average 9 and has had occasional morning headaches in the front.  She reports a  family history of sleep apnea, her son has a CPAP machine.  She sleeps on a wedge, it helps her reflux symptoms in her breathing.  She wakes up with dry mouth.  She has allergy symptoms and asthma.  She does not drink caffeine daily.  She does not drink alcohol, she is a non-smoker.   REVIEW OF SYSTEMS: Out of a complete 14 system review of symptoms, the patient complains only of the following symptoms, none and all other reviewed systems are negative.   ALLERGIES: Allergies  Allergen Reactions   Iodine Anaphylaxis   Shellfish Allergy Anaphylaxis   Sulfonamide Derivatives Rash   Cephalexin Other (See Comments)   Citrus Hives, Itching and Other (See Comments)  Itching in mouth/ blisters on lips   Lactose Intolerance (Gi) Diarrhea and Swelling    Swollen mouth   Amoxicillin Diarrhea    Did it involve swelling of the face/tongue/throat, SOB, or low BP? No Did it involve sudden or severe rash/hives, skin peeling, or any reaction on the inside of your mouth or nose? No Did you need to seek medical attention at a hospital or doctor's office? No When did it last happen?     1-77 years old  If all above answers are "NO", may proceed with cephalosporin use.    HOME MEDICATIONS: Outpatient Medications Prior to Visit  Medication Sig Dispense Refill   aspirin EC 81 MG tablet Take 81 mg by mouth daily.     atorvastatin (LIPITOR) 10 MG tablet Take 1 tablet (10 mg total) by mouth at bedtime. 90 tablet 1   Calcium Carbonate (CALCIUM 600 PO) Take 600 mg by mouth in the morning and at bedtime. (1600 & bedtime)     Cholecalciferol 50 MCG (2000 UT) CAPS Take 2,000 Units by mouth at bedtime.      diltiazem (CARDIZEM CD) 240 MG 24 hr capsule Take 1 capsule (240 mg total) by mouth daily. 90 capsule 1   empagliflozin (JARDIANCE) 10 MG TABS tablet Take 1 tablet (10 mg total) by mouth daily before breakfast. 90 tablet 1   EPINEPHrine (EPI-PEN) 0.3 mg/0.3 mL DEVI Inject 0.3 mg into the muscle See admin  instructions. Inject 0.3 mg intramuscularly in the leg as needed for severe allergic reaction, call 911 and then inject again in the other leg. Reported on 06/26/2015     febuxostat (ULORIC) 40 MG tablet Take 40 mg by mouth every evening.      fluticasone (FLONASE) 50 MCG/ACT nasal spray Place 2 sprays into both nostrils in the morning.     fluticasone (FLOVENT HFA) 110 MCG/ACT inhaler Inhale 2 puffs into the lungs daily.     ipratropium (ATROVENT) 0.03 % nasal spray Place into the nose 3 (three) times daily as needed.     levalbuterol (XOPENEX HFA) 45 MCG/ACT inhaler Inhale 2 puffs into the lungs as needed for shortness of breath.     levocetirizine (XYZAL) 5 MG tablet Take 5 mg by mouth daily at 4 PM.     losartan (COZAAR) 25 MG tablet Take 1 tablet (25 mg total) by mouth daily. 90 tablet 1   metFORMIN (GLUCOPHAGE) 850 MG tablet Take 1.5 tablets (1,275 mg total) by mouth 2 (two) times daily with a meal. 270 tablet 1   metoprolol succinate (TOPROL-XL) 25 MG 24 hr tablet Take 1 tablet (25 mg total) by mouth daily. TAKE WITH OR IMMEDIATELY FOLLOWING A MEAL. 90 tablet 1   nortriptyline (PAMELOR) 25 MG capsule Take 25 mg by mouth daily at 4 PM.     omeprazole (PRILOSEC) 40 MG capsule Take 1 capsule (40 mg total) by mouth daily. 90 capsule 1   pioglitazone (ACTOS) 45 MG tablet TAKE 1 TABLET DAILY 90 tablet 1   vitamin E 400 UNIT capsule Take 400 Units by mouth daily at 4 PM.     No facility-administered medications prior to visit.    PAST MEDICAL HISTORY: Past Medical History:  Diagnosis Date   Allergy    Environmental, foods: shellfish, lactose intolerant, citrus, peanuts   Asthma    mild persistent   Atrophic vaginitis    BRCA gene mutation negative 2015   testing done at [facility]   Cataract    had  corrective cataract surgery both eyes   Diabetes mellitus without complication (HCC)    Type 2   DJD (degenerative joint disease)    generalized joint pain, including back   Dysrhythmia     SVT   Family history of malignant neoplasm of breast    Fibromyalgia    Dr Dierdre Forth   GERD (gastroesophageal reflux disease)    Gout    Idiopathic, chronic   Hiatal hernia    History of kidney stones    Hyperlipidemia    Hypertension    Meniere's disease    R side worse, L side starting, see's Dr. Suszanne Conners.   Palpitations    PSVT on Holter monitoring   Shortness of breath    occ   Stroke [facility])    TIA  2014   SUI (stress urinary incontinence, female)    (resolved after surgery)   TIA (transient ischemic attack)    07/2012, see Dr Pearlean Brownie   Vertigo     PAST SURGICAL HISTORY: Past Surgical History:  Procedure Laterality Date   ABDOMINAL HYSTERECTOMY  1994   TAH,BSO-BLADDER NECK SUSPENSION   BREAST SURGERY     LUMPECTOMY-BENIGN Breast Bx X2   CARPAL TUNNEL RELEASE Right 2005   CATARACT EXTRACTION, BILATERAL Bilateral 2016   Cataract    CHOLECYSTECTOMY     CHOLECYSTECTOMY, LAPAROSCOPIC  2001   CYSTOCELE REPAIR  1994   EYE SURGERY     JOINT REPLACEMENT     bilatral knees;    KNEE ARTHROPLASTY  05/19/2012   Procedure: COMPUTER ASSISTED TOTAL KNEE ARTHROPLASTY;  Surgeon: Eldred Manges, MD;  Location: [facility] OR;  Service: Orthopedics;  Laterality: Left;  Left  Total Knee Arthroplasty   KNEE ARTHROPLASTY Right 09/03/2013   Procedure: COMPUTER ASSISTED TOTAL KNEE ARTHROPLASTY;  Surgeon: Eldred Manges, MD;  Location: [facility] OR;  Service: Orthopedics;  Laterality: Right;  Right Total Knee Arthroplasty, Computer Assist   KNEE ARTHROSCOPY     bil   LUMBAR LAMINECTOMY/DECOMPRESSION MICRODISCECTOMY N/A 08/18/2020   Procedure: L4-5 decompression, possible left L4-5 microdiscectomy;  Surgeon: Eldred Manges, MD;  Location: [facility] OR;  Service: Orthopedics;  Laterality: N/A;   OOPHORECTOMY     BSO   SPINE SURGERY  07/2020   SURGERY FOR POSERIOR TIBIAL TENDON  2003   TONSILLECTOMY     TRIPLE FUSION LEFT ANKLE Left 2007   TUBAL LIGATION      FAMILY HISTORY: Family History  Problem Relation Age of  Onset   Breast cancer Mother 31       deceased 91   Arthritis Mother    Cancer Mother    Cancer Father        Brain tumor; path?; deceased 49   Breast cancer Sister 44       bilateral breast ca @ 78; BRCA2 positive   Hypertension Sister    Cancer Sister    Hemochromatosis Sister    Thalassemia Sister    Other Sister        negative for BRCA1 BRCA2   Birth defects Sister    Cancer Sister    Heart failure Sister        due to chemo?   Lymphoma Maternal Aunt        deceased 77s   Cancer Maternal Uncle    CAD Son        MI   Diabetes Son    Leukemia Son    Breast cancer Cousin        BRCA2  positive   Diabetes Other    Breast cancer Other        distant maternal female relatives with breast cancer   Diabetes Maternal Grandmother    Cancer Son    Diabetes Son    Colon cancer Neg Hx    Colon polyps Neg Hx    Esophageal cancer Neg Hx    Rectal cancer Neg Hx    Stomach cancer Neg Hx     SOCIAL HISTORY: Social History   Socioeconomic History   Marital status: Married    Spouse name: Dorene Sorrow   Number of children: 2   Years of education: 16   Highest education level: Some college, no degree  Occupational History   Occupation: Location manager-- retired     Associate Professor: RETIRED  Tobacco Use   Smoking status: Never   Smokeless tobacco: Never  Vaping Use   Vaping status: Never Used  Substance and Sexual Activity   Alcohol use: No    Alcohol/week: 0.0 standard drinks of alcohol   Drug use: No   Sexual activity: Not Currently    Birth control/protection: Surgical  Other Topics Concern   Not on file  Social History Narrative   HSG   1 year college for accounting. married '69. 2 sons - '70, '72: retired - [employer].     ACP - OK for CPR at least once, no long term ventilation, no heroic measures in the face of poor quality of life. HCPOA - husband, secondary son Shantay Eighmey (c) [phone]   No Cafffeine (or very rare).    Right Handed       Social Determinants of Health    Financial Resource Strain: Low Risk  (08/19/2022)   Overall Financial Resource Strain (CARDIA)    Difficulty of Paying Living Expenses: Not hard at all  Food Insecurity: No Food Insecurity (08/19/2022)   Hunger Vital Sign    Worried About Running Out of Food in the Last Year: Never true    Ran Out of Food in the Last Year: Never true  Transportation Needs: No Transportation Needs (08/19/2022)   PRAPARE - Administrator, Civil Service (Medical): No    Lack of Transportation (Non-Medical): No  Physical Activity: Insufficiently Active (08/19/2022)   Exercise Vital Sign    Days of Exercise per Week: 3 days    Minutes of Exercise per Session: 20 min  Stress: No Stress Concern Present (08/19/2022)   [employer] of Occupational Health - Occupational Stress Questionnaire    Feeling of Stress : Not at all  Social Connections: Moderately Integrated (08/19/2022)   Social Connection and Isolation Panel [NHANES]    Frequency of Communication with Friends and Family: More than three times a week    Frequency of Social Gatherings with Friends and Family: More than three times a week    Attends Religious Services: 1 to 4 times per year    Active Member of [employer] or Organizations: No    Attends Banker Meetings: Not on file    Marital Status: Married  Catering manager Violence: Not At Risk (10/05/2022)   Humiliation, Afraid, Rape, and Kick questionnaire    Fear of Current or Ex-Partner: No    Emotionally Abused: No    Physically Abused: No    Sexually Abused: No     PHYSICAL EXAM  Vitals:   04/13/23 1006  BP: (!) 141/73  Pulse: 90  Weight: 221 lb (100.2 kg)  Height: 5\' 5"  (1.651 m)  Body mass index is 36.78 kg/m.  Generalized: Well developed, in no acute distress  Cardiology: normal rate and rhythm, no murmur noted Respiratory: clear to auscultation bilaterally  Neurological examination  Mentation: Alert oriented to time, place, history taking. Follows all  commands speech and language fluent Cranial nerve II-XII: Pupils were equal round reactive to light. Extraocular movements were full, visual field were full  Motor: The motor testing reveals 5 over 5 strength of all 4 extremities. Good symmetric motor tone is noted throughout.  Gait and station: Gait is normal.    DIAGNOSTIC DATA (LABS, IMAGING, TESTING) - I reviewed patient records, labs, notes, testing and imaging myself where available.      No data to display           Lab Results  Component Value Date   WBC 7.4 08/26/2022   HGB 14.4 08/26/2022   HCT 43.7 08/26/2022   MCV 87.0 08/26/2022   PLT 271.0 08/26/2022      Component Value Date/Time   NA 139 12/27/2022 1321   NA 140 10/22/2015 0000   K 4.5 12/27/2022 1321   CL 104 12/27/2022 1321   CO2 23 12/27/2022 1321   GLUCOSE 111 (H) 12/27/2022 1321   BUN 21 12/27/2022 1321   BUN 18 10/22/2015 0000   CREATININE 0.98 12/27/2022 1321   CREATININE 0.85 10/05/2018 1332   CALCIUM 9.6 12/27/2022 1321   PROT 6.8 03/29/2022 1111   ALBUMIN 4.3 03/29/2022 1111   AST 18 03/29/2022 1111   ALT 22 03/29/2022 1111   ALKPHOS 64 03/29/2022 1111   BILITOT 0.9 03/29/2022 1111   GFRNONAA 58 (L) 08/13/2020 1449   GFRNONAA 68 10/05/2018 1332   GFRAA 55 (L) 02/23/2019 1457   GFRAA 79 10/05/2018 1332   Lab Results  Component Value Date   CHOL 109 12/27/2022   HDL 40.80 12/27/2022   LDLCALC 36 12/27/2022   LDLDIRECT 47.0 11/26/2021   TRIG 164.0 (H) 12/27/2022   CHOLHDL 3 12/27/2022   Lab Results  Component Value Date   HGBA1C 6.8 (H) 12/27/2022   No results found for: "VITAMINB12" Lab Results  Component Value Date   TSH 2.85 03/29/2022     ASSESSMENT AND PLAN 77 y.o. year old female  has a past medical history of Allergy, Asthma, Atrophic vaginitis, BRCA gene mutation negative (2015), Cataract, Diabetes mellitus without complication (HCC), DJD (degenerative joint disease), Dysrhythmia, Family history of malignant neoplasm  of breast, Fibromyalgia, GERD (gastroesophageal reflux disease), Gout, Hiatal hernia, History of kidney stones, Hyperlipidemia, Hypertension, Meniere's disease, Palpitations, Shortness of breath, Stroke (HCC), SUI (stress urinary incontinence, female), TIA (transient ischemic attack), and Vertigo. here with     ICD-10-CM   1. OSA on CPAP  G47.33 For home use only DME continuous positive airway pressure (CPAP)       SHEELA MALL is doing well on CPAP therapy. Compliance report reveals excellent compliance. She was encouraged to continue using CPAP nightly and for greater than 4 hours each night. We will update supply orders as indicated. Risks of untreated sleep apnea review and education materials provided. Healthy lifestyle habits encouraged. She will follow up in 1 year, sooner if needed. She verbalizes understanding and agreement with this plan.    Orders Placed This Encounter  Procedures   For home use only DME continuous positive airway pressure (CPAP)    Supplies    Order Specific Question:   Length of Need    Answer:   Lifetime    Order  Specific Question:   Patient has OSA or probable OSA    Answer:   Yes    Order Specific Question:   Is the patient currently using CPAP in the home    Answer:   Yes    Order Specific Question:   Settings    Answer:   Other see comments    Order Specific Question:   CPAP supplies needed    Answer:   Mask, headgear, cushions, filters, heated tubing and water chamber     No orders of the defined types were placed in this encounter.     Shawnie Dapper, FNP-C 04/13/2023, 10:42 AM [facility] [address], [address] [place], [place] [address] [phone]

## 2023-04-13 ENCOUNTER — Telehealth: Payer: Self-pay | Admitting: Cardiology

## 2023-04-13 ENCOUNTER — Encounter: Payer: Self-pay | Admitting: Family Medicine

## 2023-04-13 ENCOUNTER — Ambulatory Visit (INDEPENDENT_AMBULATORY_CARE_PROVIDER_SITE_OTHER): Payer: Medicare Other | Admitting: Family Medicine

## 2023-04-13 VITALS — BP 141/73 | HR 90 | Ht 65.0 in | Wt 221.0 lb

## 2023-04-13 DIAGNOSIS — G4733 Obstructive sleep apnea (adult) (pediatric): Secondary | ICD-10-CM

## 2023-04-13 NOTE — Telephone Encounter (Signed)
Patient identification verified by 2 forms. Marilynn Rail, RN   Called and spoke to patient  Patient states:   -saw neurology today   -was told heart rate was irregular   -was advised to contact cardiology/PCP   -has had a couple of episodes where she felt heart was racing, was occurred a few times in this past week   -has episodes of lightheadedness/dizziness but unsure if related to meniere's disease  Patient denies:   -Chest pain   -episodes of SOB Patient scheduled for OV 11/15 at 8:00am  Reviewed ED warning signs/precautions  Patient verbalized understanding, no questions at this time

## 2023-04-13 NOTE — Progress Notes (Signed)
Cardiology Office Note:  .   Date:  04/15/2023  ID:  Kristy [name], DOB [birth year], MRN [id number] PCP: Wanda Plump, MD  [facility] Providers Cardiologist: Little Ishikawa, MD {  History of Present Illness: .    Kristy [name] is a 77 y.o. female TIA, fibromyalgia, SVT, OSA Lexiscan Myoview was done on 07/22/2020, which showed fixed basal to mid anteroseptal defect likely representing artifact versus infarct, EF 49%.   Echocardiogram on 07/23/2020 showed LVEF 60 to 65%, grade 1 diastolic dysfunction, normal RV function, no significant valvular disease. Last seen by Dr. Bjorn Pippin on 03/24/2022. Zio monitor was ordered which revealed 1 episode of bradycardia during sleep.  She does have known history of sleep apnea.  Metoprolol was decreased to Toprol XL 25 mg daily.  She is here for annual follow-up.   She comes today with complaints of elevated heart rate.  Her neurologist noticed this when she saw her on follow-up and has had her come back to cardiology to be reevaluated.  She notices that her heart rate has been up more so since decreasing the dose of the metoprolol.  She is under a lot of stress taking care of her husband and her son with chronic illnesses going to multiple doctors appointments with them which does add to her own physical stressors as well.  She is followed by [facility] neurology for CPAP titration.  She also is seen by primary care and labs are drawn annually she is due in December 2024.  She denies chest pain, she has chronic shortness of breath in the setting of asthma, she does have dizziness due to Mnire's, and she has generalized fatigue in the setting of fibromyalgia and stressors within her family.  ROS: As above otherwise negative.  Studies Reviewed: Marland Kitchen   EKG Interpretation Date/Time:  Friday April 15 2023 08:04:15 EST Ventricular Rate:  93 PR Interval:  140 QRS Duration:  92 QT Interval:  372 QTC Calculation: 462 R Axis:   36  Text  Interpretation: Sinus rhythm with occasional Premature ventricular complexes When compared with ECG of 23-Feb-2019 18:08, PREVIOUS ECG IS PRESENT Confirmed by Joni Reining [phone]) on 04/15/2023 8:20:27 AM    Physical Exam:   VS:  BP 130/62 (BP Location: Left Arm, Patient Position: Sitting, Cuff Size: Normal)   Pulse 93   Ht 5\' 5"  (1.651 m)   Wt 220 lb (99.8 kg)   SpO2 93%   BMI 36.61 kg/m    Wt Readings from Last 3 Encounters:  04/15/23 220 lb (99.8 kg)  04/13/23 221 lb (100.2 kg)  12/27/22 226 lb (102.5 kg)    GEN: Well nourished, well developed in no acute distress NECK: No JVD; No carotid bruits CARDIAC: RRR, tachycardic, occasional extrasystole no murmurs, rubs, gallops RESPIRATORY:  Clear to auscultation without rales, wheezing or rhonchi  ABDOMEN: Soft, non-tender, non-distended EXTREMITIES:  No edema; No deformity   ASSESSMENT AND PLAN: .    SVT: This was noted on ZIO monitor.  She has had adjustments in metoprolol to 25 mg daily from 50 mg daily.  She is noticing increasing heart rates and racing at times since decreasing the dose.  She also had reduction in the dose due to a pause during the night while on CPAP.  Currently she is under a lot of stress and is noticing that her heart rate has been racing several times during the week.  I am going to increase her metoprolol succinate to 37.5 mg daily  from 25 mg daily to assist with better heart rate control without going all the way back to 50 mg daily.  She will let us know if she is feeling better on this or having worsening symptoms.  2.  History of TIA: Continues on aspirin 81 mg daily.  Followed by neurology.  She states she has fallen a couple times due to dizziness which she relates to Mnire's.  3.  Hypertension: Blood pressure is very well-controlled today.  Would continue losartan and diltiazem as directed metoprolol adjustment as discussed.       Signed, Bettey Mare. Liborio Nixon, ANP, AACC

## 2023-04-13 NOTE — Telephone Encounter (Signed)
Patient c/o Palpitations:  High priority if patient c/o lightheadedness, shortness of breath, or chest pain  How long have you had palpitations/irregular HR/ Afib? Are you having the symptoms now?  Patient states she saw urology this morning and her HR was irregular and they advised her to follow up with cardiology soon. Patient states she just found out today, and has been asymptomatic.  Are you currently experiencing lightheadedness, SOB or CP?  No   Do you have a history of afib (atrial fibrillation) or irregular heart rhythm?  Hx of SVT  Have you checked your BP or HR? (document readings if available):  Patient states her BP is generally normal, in the 120/70's. This morning it was a little high at 141/73. See chart for HR.  Are you experiencing any other symptoms?  No.

## 2023-04-15 ENCOUNTER — Encounter: Payer: Self-pay | Admitting: Adult Health

## 2023-04-15 ENCOUNTER — Ambulatory Visit: Payer: Medicare Other | Attending: Adult Health | Admitting: Adult Health

## 2023-04-15 VITALS — BP 130/62 | HR 93 | Ht 65.0 in | Wt 220.0 lb

## 2023-04-15 DIAGNOSIS — I471 Supraventricular tachycardia, unspecified: Secondary | ICD-10-CM | POA: Diagnosis not present

## 2023-04-15 DIAGNOSIS — R002 Palpitations: Secondary | ICD-10-CM | POA: Diagnosis present

## 2023-04-15 DIAGNOSIS — G459 Transient cerebral ischemic attack, unspecified: Secondary | ICD-10-CM | POA: Diagnosis not present

## 2023-04-15 DIAGNOSIS — I10 Essential (primary) hypertension: Secondary | ICD-10-CM | POA: Insufficient documentation

## 2023-04-15 MED ORDER — METOPROLOL SUCCINATE ER 25 MG PO TB24: 37.5000 mg | ORAL_TABLET | Freq: Every day | ORAL | 3 refills | Status: DC

## 2023-04-15 NOTE — Patient Instructions (Signed)
Medication Instructions:  Increase Metoprolol Succinate to 37.5 mg ( Take 1.5 Tablets Daily). *If you need a refill on your cardiac medications before your next appointment, please call your pharmacy*   Lab Work: No labs If you have labs (blood work) drawn today and your tests are completely normal, you will receive your results only by: MyChart Message (if you have MyChart) OR A paper copy in the mail If you have any lab test that is abnormal or we need to change your treatment, we will call you to review the results.   Testing/Procedures: No Testing   Follow-Up: At [facility], you and your health needs are our priority.  As part of our continuing mission to provide you with exceptional heart care, we have created designated Provider Care Teams.  These Care Teams include your primary Cardiologist (physician) and Advanced Practice Providers (APPs -  Physician Assistants and Nurse Practitioners) who all work together to provide you with the care you need, when you need it.  We recommend signing up for the patient portal called "MyChart".  Sign up information is provided on this After Visit Summary.  MyChart is used to connect with patients for Virtual Visits (Telemedicine).  Patients are able to view lab/test results, encounter notes, upcoming appointments, etc.  Non-urgent messages can be sent to your provider as well.   To learn more about what you can do with MyChart, go to [email or web address].    Your next appointment:   1 year(s)  Provider:   Little Ishikawa, MD

## 2023-04-18 DIAGNOSIS — R945 Abnormal results of liver function studies: Secondary | ICD-10-CM | POA: Diagnosis not present

## 2023-04-18 DIAGNOSIS — M1A09X0 Idiopathic chronic gout, multiple sites, without tophus (tophi): Secondary | ICD-10-CM | POA: Diagnosis not present

## 2023-04-18 DIAGNOSIS — Z6836 Body mass index (BMI) 36.0-36.9, adult: Secondary | ICD-10-CM | POA: Diagnosis not present

## 2023-04-18 DIAGNOSIS — M797 Fibromyalgia: Secondary | ICD-10-CM | POA: Diagnosis not present

## 2023-04-18 DIAGNOSIS — E669 Obesity, unspecified: Secondary | ICD-10-CM | POA: Diagnosis not present

## 2023-04-18 DIAGNOSIS — M1991 Primary osteoarthritis, unspecified site: Secondary | ICD-10-CM | POA: Diagnosis not present

## 2023-04-18 DIAGNOSIS — M65321 Trigger finger, right index finger: Secondary | ICD-10-CM | POA: Diagnosis not present

## 2023-04-29 ENCOUNTER — Other Ambulatory Visit: Payer: Self-pay | Admitting: Internal Medicine

## 2023-05-16 ENCOUNTER — Other Ambulatory Visit: Payer: Self-pay | Admitting: Obstetrics & Gynecology

## 2023-05-16 DIAGNOSIS — Z1231 Encounter for screening mammogram for malignant neoplasm of breast: Secondary | ICD-10-CM

## 2023-05-30 ENCOUNTER — Encounter: Payer: Self-pay | Admitting: Internal Medicine

## 2023-05-30 ENCOUNTER — Ambulatory Visit (INDEPENDENT_AMBULATORY_CARE_PROVIDER_SITE_OTHER): Payer: Medicare Other | Admitting: Internal Medicine

## 2023-05-30 VITALS — BP 124/72 | HR 95 | Temp 98.0°F | Resp 18 | Ht 65.0 in | Wt 220.4 lb

## 2023-05-30 DIAGNOSIS — E7800 Pure hypercholesterolemia, unspecified: Secondary | ICD-10-CM

## 2023-05-30 DIAGNOSIS — I471 Supraventricular tachycardia, unspecified: Secondary | ICD-10-CM | POA: Diagnosis not present

## 2023-05-30 DIAGNOSIS — F439 Reaction to severe stress, unspecified: Secondary | ICD-10-CM

## 2023-05-30 DIAGNOSIS — Z7984 Long term (current) use of oral hypoglycemic drugs: Secondary | ICD-10-CM | POA: Diagnosis not present

## 2023-05-30 DIAGNOSIS — E1169 Type 2 diabetes mellitus with other specified complication: Secondary | ICD-10-CM

## 2023-05-30 DIAGNOSIS — I10 Essential (primary) hypertension: Secondary | ICD-10-CM

## 2023-05-30 LAB — BASIC METABOLIC PANEL
BUN: 21 mg/dL (ref 6–23)
CO2: 24 meq/L (ref 19–32)
Calcium: 10 mg/dL (ref 8.4–10.5)
Chloride: 104 meq/L (ref 96–112)
Creatinine, Ser: 0.97 mg/dL (ref 0.40–1.20)
GFR: 55.96 mL/min — ABNORMAL LOW (ref >60.00)
Glucose, Bld: 110 mg/dL — ABNORMAL HIGH (ref 70–99)
Potassium: 4.7 meq/L (ref 3.5–5.1)
Sodium: 140 meq/L (ref 135–145)

## 2023-05-30 LAB — MICROALBUMIN / CREATININE URINE RATIO
Creatinine,U: 87.2 mg/dL
Microalb Creat Ratio: 0.8 mg/g (ref 0.0–30.0)
Microalb, Ur: <0.7 mg/dL (ref 0.0–1.9)

## 2023-05-30 LAB — HEMOGLOBIN A1C: Hgb A1c MFr Bld: 6.9 % — ABNORMAL HIGH (ref 4.6–6.5)

## 2023-05-30 NOTE — Assessment & Plan Note (Signed)
DM: Last A1c 6.8, no changes made, on Jardiance, metformin, pioglitazone.  Will recheck A1c, micro.  Diet is okay, has been unable to be as active as she used to.  Patient will be reluctant to change medications unless A1c has increased a lot. HTN: BP is great, continue Metoprolol, Cardizem, losartan.  Check a BMP High cholesterol: Last LDL 36, on atorvastatin 10 mg.  No change SVT Saw cardiology 04/25/2023, she reported  increased symptoms, metoprolol increased to 37.5 mg daily.  Doing better. OSA: Last visit with neurology November 2024. Stress: Husband has end-stage liver disease, their son moved back in w/ them, has diabetes, leukemia.  Listening therapy provided. Preventive care: Had a recent flu and COVID-vaccine.  Has a mammogram scheduled already and plans to see GYN in the next few months. RTC 4 to 6 months

## 2023-05-30 NOTE — Patient Instructions (Signed)
  Check the  blood pressure regularly Blood pressure goal:  between 110/65 and  135/85. If it is consistently higher or lower, let me know     GO TO THE LAB : Get the blood work     Next visit with me in 4 to 6 months Please schedule it at the front desk

## 2023-05-30 NOTE — Progress Notes (Signed)
Subjective:    Patient ID: Kristy [name], female    DOB: [birth year], 77 y.o.   MRN: [id number]  DOS:  05/30/2023 Type of visit - description: Routine follow-up  Today we talked mostly about diabetes and high blood pressure.  Also, reports a lot of stress, 2 family members have a number of medical problems. She is very busy taking care of them, has been unable to be more active. Has not changed her diet.   Wt Readings from Last 3 Encounters:  05/30/23 220 lb 6 oz (100 kg)  04/15/23 220 lb (99.8 kg)  04/13/23 221 lb (100.2 kg)     Review of Systems See above   Past Medical History:  Diagnosis Date   Allergy    Environmental, foods: shellfish, lactose intolerant, citrus, peanuts   Asthma    mild persistent   Atrophic vaginitis    BRCA gene mutation negative 2015   testing done at [facility]   Cataract    had corrective cataract surgery both eyes   Diabetes mellitus without complication (HCC)    Type 2   DJD (degenerative joint disease)    generalized joint pain, including back   Dysrhythmia    SVT   Family history of malignant neoplasm of breast    Fibromyalgia    Dr Dierdre Forth   GERD (gastroesophageal reflux disease)    Gout    Idiopathic, chronic   Hiatal hernia    History of kidney stones    Hyperlipidemia    Hypertension    Meniere's disease    R side worse, L side starting, see's Dr. Suszanne Conners.   Palpitations    PSVT on Holter monitoring   Shortness of breath    occ   Stroke [facility])    TIA  2014   SUI (stress urinary incontinence, female)    (resolved after surgery)   TIA (transient ischemic attack)    07/2012, see Dr Pearlean Brownie   Vertigo     Past Surgical History:  Procedure Laterality Date   ABDOMINAL HYSTERECTOMY  1994   TAH,BSO-BLADDER NECK SUSPENSION   BREAST SURGERY     LUMPECTOMY-BENIGN Breast Bx X2   CARPAL TUNNEL RELEASE Right 2005   CATARACT EXTRACTION, BILATERAL Bilateral 2016   Cataract    CHOLECYSTECTOMY     CHOLECYSTECTOMY, LAPAROSCOPIC   2001   CYSTOCELE REPAIR  1994   EYE SURGERY     JOINT REPLACEMENT     bilatral knees;    KNEE ARTHROPLASTY  05/19/2012   Procedure: COMPUTER ASSISTED TOTAL KNEE ARTHROPLASTY;  Surgeon: Eldred Manges, MD;  Location: [facility] OR;  Service: Orthopedics;  Laterality: Left;  Left  Total Knee Arthroplasty   KNEE ARTHROPLASTY Right 09/03/2013   Procedure: COMPUTER ASSISTED TOTAL KNEE ARTHROPLASTY;  Surgeon: Eldred Manges, MD;  Location: [facility] OR;  Service: Orthopedics;  Laterality: Right;  Right Total Knee Arthroplasty, Computer Assist   KNEE ARTHROSCOPY     bil   LUMBAR LAMINECTOMY/DECOMPRESSION MICRODISCECTOMY N/A 08/18/2020   Procedure: L4-5 decompression, possible left L4-5 microdiscectomy;  Surgeon: Eldred Manges, MD;  Location: [facility];  Service: Orthopedics;  Laterality: N/A;   OOPHORECTOMY     BSO   SPINE SURGERY  07/2020   SURGERY FOR POSERIOR TIBIAL TENDON  2003   TONSILLECTOMY     TRIPLE FUSION LEFT ANKLE Left 2007   TUBAL LIGATION      Current Outpatient Medications  Medication Instructions   aspirin EC 81 mg, Daily   atorvastatin (LIPITOR)  10 mg, Oral, Daily at bedtime   Calcium Carbonate (CALCIUM 600 PO) 600 mg, 2 times daily   Cholecalciferol 2,000 Units, Daily at bedtime   diltiazem (CARDIZEM CD) 240 mg, Oral, Daily   empagliflozin (JARDIANCE) 10 mg, Oral, Daily before breakfast   EPINEPHrine (EPI-PEN) 0.3 mg, See admin instructions   febuxostat (ULORIC) 40 mg, Every evening   fluticasone (FLONASE) 50 MCG/ACT nasal spray 2 sprays, Every morning   fluticasone (FLOVENT HFA) 110 MCG/ACT inhaler 2 puffs, Daily   ipratropium (ATROVENT) 0.03 % nasal spray 3 times daily PRN   levalbuterol (XOPENEX HFA) 45 MCG/ACT inhaler 2 puffs, As needed   levocetirizine (XYZAL) 5 mg, Daily-1600   losartan (COZAAR) 25 mg, Oral, Daily   metFORMIN (GLUCOPHAGE) 1,275 mg, Oral, 2 times daily with meals   metoprolol succinate (TOPROL XL) 37.5 mg, Oral, Daily   nortriptyline (PAMELOR) 25 mg, Daily-1600    omeprazole (PRILOSEC) 40 mg, Oral, Daily   pioglitazone (ACTOS) 45 mg, Oral, Daily   vitamin E 400 Units, Daily-1600       Objective:   Physical Exam BP 124/72   Pulse 95   Temp 98 F (36.7 C) (Oral)   Resp 18   Ht 5\' 5"  (1.651 m)   Wt 220 lb 6 oz (100 kg)   SpO2 94%   BMI 36.67 kg/m  General:   Well developed, NAD, BMI noted. HEENT:  Normocephalic . Face symmetric, atraumatic Lungs:  CTA B Normal respiratory effort, no intercostal retractions, no accessory muscle use. Heart: RRR,  no murmur.  Lower extremities: no pretibial edema bilaterally  Skin: Not pale. Not jaundice Neurologic:  alert & oriented X3.  Speech normal, gait assisted by walker  psych--  Cognition and judgment appear intact.  Cooperative with normal attention span and concentration.  Behavior appropriate. No anxious or depressed appearing.      Assessment    Assessment DM w/ h/o TIA HTN  Hyperlipidemia  Insomnia  Asthma , allergies-- sees Dr Irena Cords  GERD, hiatal hernia , h/o IBS Obesity  CV: ---Palpitations: PSVT by Holter, Dr Allyson Sabal ---TIA, dx 2014,last visit w/  Dr. Pearlean Brownie 09-2015, RTC prn ENT: Dr Danice Goltz -Vertigo (improved with vestibular rehabilitation 2015) -HOH- aids, brain  MRI 03/2019.   -Menier's (Dx   2020) MSK: *Osteopenia: dexa per Gyn *Fibromyalgia, DJD, Gout : per Dr. Dierdre Forth, on Pamelor  *GOUT *Hyperparathyroidism h/o slt increased PTH 2015 , normal PTH 05-2014  GU:  --Kidney stones, h/o; Urinary incontinence; Atrophic vaginitis OSA dx 08-2021, started CPAP 10/2021 FH breast cancer , sister is BRCA2 (+), pt is  (-) Increased LFTs --- CT abdomen 2014: NormalLiver, negative hepatitis serologies at rheumatology per patient  PLAN: DM: Last A1c 6.8, no changes made, on Jardiance, metformin, pioglitazone.  Will recheck A1c, micro.  Diet is okay, has been unable to be as active as she used to.  Patient will be reluctant to change medications unless A1c has increased a lot. HTN:  BP is great, continue Metoprolol, Cardizem, losartan.  Check a BMP High cholesterol: Last LDL 36, on atorvastatin 10 mg.  No change SVT Saw cardiology 04/25/2023, she reported  increased symptoms, metoprolol increased to 37.5 mg daily.  Doing better. OSA: Last visit with neurology November 2024. Stress: Husband has end-stage liver disease, their son moved back in w/ them, has diabetes, leukemia.  Listening therapy provided. Preventive care: Had a recent flu and COVID-vaccine.  Has a mammogram scheduled already and plans to see GYN in the next few  months. RTC 4 to 6 months

## 2023-05-31 ENCOUNTER — Other Ambulatory Visit: Payer: Self-pay | Admitting: Internal Medicine

## 2023-06-04 ENCOUNTER — Other Ambulatory Visit: Payer: Self-pay | Admitting: Internal Medicine

## 2023-06-10 ENCOUNTER — Ambulatory Visit
Admission: RE | Admit: 2023-06-10 | Discharge: 2023-06-10 | Disposition: A | Discharge status: Home / Self Care | Payer: Medicare Other | Source: Ambulatory Visit | Attending: Obstetrics & Gynecology | Admitting: Obstetrics & Gynecology

## 2023-06-10 DIAGNOSIS — Z1231 Encounter for screening mammogram for malignant neoplasm of breast: Secondary | ICD-10-CM

## 2023-06-18 ENCOUNTER — Other Ambulatory Visit: Payer: Self-pay | Admitting: Internal Medicine

## 2023-07-27 DIAGNOSIS — H26493 Other secondary cataract, bilateral: Secondary | ICD-10-CM | POA: Diagnosis not present

## 2023-07-27 DIAGNOSIS — H43393 Other vitreous opacities, bilateral: Secondary | ICD-10-CM | POA: Diagnosis not present

## 2023-07-27 DIAGNOSIS — H81399 Other peripheral vertigo, unspecified ear: Secondary | ICD-10-CM | POA: Diagnosis not present

## 2023-07-27 DIAGNOSIS — H53143 Visual discomfort, bilateral: Secondary | ICD-10-CM | POA: Diagnosis not present

## 2023-07-27 DIAGNOSIS — E119 Type 2 diabetes mellitus without complications: Secondary | ICD-10-CM | POA: Diagnosis not present

## 2023-07-27 LAB — HM DIABETES EYE EXAM

## 2023-07-28 ENCOUNTER — Other Ambulatory Visit: Payer: Self-pay | Admitting: Internal Medicine

## 2023-07-28 DIAGNOSIS — J3089 Other allergic rhinitis: Secondary | ICD-10-CM | POA: Diagnosis not present

## 2023-07-28 DIAGNOSIS — J301 Allergic rhinitis due to pollen: Secondary | ICD-10-CM | POA: Diagnosis not present

## 2023-07-28 DIAGNOSIS — J3081 Allergic rhinitis due to animal (cat) (dog) hair and dander: Secondary | ICD-10-CM | POA: Diagnosis not present

## 2023-07-28 DIAGNOSIS — J4530 Mild persistent asthma, uncomplicated: Secondary | ICD-10-CM | POA: Diagnosis not present

## 2023-08-24 ENCOUNTER — Other Ambulatory Visit: Payer: Self-pay | Admitting: Family

## 2023-08-25 ENCOUNTER — Ambulatory Visit (INDEPENDENT_AMBULATORY_CARE_PROVIDER_SITE_OTHER)

## 2023-08-25 VITALS — BP 131/78 | HR 68 | Ht 65.0 in | Wt 220.0 lb

## 2023-08-25 DIAGNOSIS — H903 Sensorineural hearing loss, bilateral: Secondary | ICD-10-CM | POA: Diagnosis not present

## 2023-08-25 DIAGNOSIS — R42 Dizziness and giddiness: Secondary | ICD-10-CM

## 2023-08-25 DIAGNOSIS — H8101 Meniere's disease, right ear: Secondary | ICD-10-CM | POA: Diagnosis not present

## 2023-08-25 NOTE — Progress Notes (Unsigned)
 Patient ID: Kristy [name], female   DOB: 1946-04-19, 78 y.o.   MRN: [id number]  Follow up: Recurrent dizziness, Meniere's disease, hearing loss  HPI: The patient is a 78 year old female who returns today for her follow-up evaluation.  She has a history of right ear Mnire's disease.  She was treated with a 1500 mg low-salt diet.  She also has a history of bilateral sensorineural hearing loss, worse on the right side.  Her previous MRI scan was negative.  The patient returns today reporting intermittent dizziness over the past 6 months.  She has had 2 episode of spinning vertigo.  She has had multiple falls due to her off-balance sensation.  She continues to have hearing difficulty, worse on the right side.  She is following the low-salt diet.  She was also on Lasix previously.  However, she was taken off the diuretic due to her low blood pressure.  Currently she denies any otalgia or otorrhea.  Exam: General: Communicates without difficulty, well nourished, no acute distress. Head: Normocephalic, no evidence injury, no tenderness, facial buttresses intact without stepoff. Face/sinus: No tenderness to palpation and percussion. Facial movement is normal and symmetric. Eyes: PERRL, EOMI. No scleral icterus, conjunctivae clear. Neuro: CN II exam reveals vision grossly intact.  No nystagmus at any point of gaze. Ears: Auricles well formed without lesions.  Ear canals are intact without mass or lesion.  No erythema or edema is appreciated.  The TMs are intact without fluid. Nose: External evaluation reveals normal support and skin without lesions.  Dorsum is intact.  Anterior rhinoscopy reveals pink mucosa over anterior aspect of inferior turbinates and intact septum.  No purulence noted. Oral:  Oral cavity and oropharynx are intact, symmetric, without erythema or edema.  Mucosa is moist without lesions. Neck: Full range of motion without pain.  There is no significant lymphadenopathy.  No masses palpable.  Thyroid  bed within normal limits to palpation.  Parotid glands and submandibular glands equal bilaterally without mass.  Trachea is midline. Neuro:  CN 2-12 grossly intact. Gait wide-based. Vestibular: No nystagmus at any point of gaze. The cerebellar examination is unremarkable. Vestibular: There is no nystagmus with pneumatic pressure on either tympanic membrane or Valsalva.   Assessment: 1.  Right ear Mnire's disease, resulting in intermittent vertigo. 2.  Bilateral sensorineural hearing loss, slightly worse on the right side. 3.  The patient's ear canals, tympanic membranes, and middle ear spaces are all normal.   Plan: 1.  The physical exam findings are reviewed with the patient.   2.  Continue with 1500 mg low-salt diet.  3.  Continue balance exercise daily. 4.  The pathophysiology of Mnire's disease and dizziness are extensively discussed with the patient.  Questions are invited and answered. 5.  The option of endolymphatic sac decompression surgery to treat her recurrent vertigo is also discussed.  The risk, benefits, and details of the procedure are reviewed. 6.  The patient would like to consider her options.  The patient will return for reevaluation in 6 months.

## 2023-08-26 DIAGNOSIS — H903 Sensorineural hearing loss, bilateral: Secondary | ICD-10-CM | POA: Insufficient documentation

## 2023-09-02 ENCOUNTER — Other Ambulatory Visit: Payer: Self-pay | Admitting: Internal Medicine

## 2023-09-02 MED ORDER — PIOGLITAZONE HCL 45 MG PO TABS: 45.0000 mg | ORAL_TABLET | Freq: Every day | ORAL | 1 refills | Status: DC

## 2023-09-02 NOTE — Telephone Encounter (Signed)
 Copied from CRM [id number]. Topic: Clinical - Medication Refill >> Sep 02, 2023  2:07 PM Sonny Dandy B wrote: Most Recent Primary Care Visit:  Provider: Willow Ora E  Department: [facility]  Visit Type: OFFICE VISIT  Date: 05/30/2023  Medication: pioglitazone (ACTOS) 45 MG tabl  Has the patient contacted their pharmacy? Yes (Agent: If no, request that the patient contact the pharmacy for the refill. If patient does not wish to contact the pharmacy document the reason why and proceed with request.) (Agent: If yes, when and what did the pharmacy advise?)  Is this the correct pharmacy for this prescription? Yes If no, delete pharmacy and type the correct one.  This is the patient's preferred pharmacy:    [facility] [place], [place] - [address] Portal to Registered Caremark Sites [address] [place] [address] Phone: [phone] Fax: [phone]   Has the prescription been filled recently? Yes  Is the patient out of the medication? Yes  Has the patient been seen for an appointment in the last year OR does the patient have an upcoming appointment? Yes  Can we respond through MyChart? Yes  Agent: Please be advised that Rx refills may take up to 3 business days. We ask that you follow-up with your pharmacy.

## 2023-09-14 ENCOUNTER — Other Ambulatory Visit: Payer: Self-pay | Admitting: Internal Medicine

## 2023-10-05 ENCOUNTER — Ambulatory Visit: Admitting: Obstetrics & Gynecology

## 2023-10-05 ENCOUNTER — Encounter: Payer: Self-pay | Admitting: Obstetrics & Gynecology

## 2023-10-05 VITALS — BP 123/62 | HR 97 | Ht 65.0 in | Wt 223.0 lb

## 2023-10-05 DIAGNOSIS — Z01419 Encounter for gynecological examination (general) (routine) without abnormal findings: Secondary | ICD-10-CM

## 2023-10-05 DIAGNOSIS — Z1339 Encounter for screening examination for other mental health and behavioral disorders: Secondary | ICD-10-CM

## 2023-10-05 DIAGNOSIS — Z9189 Other specified personal risk factors, not elsewhere classified: Secondary | ICD-10-CM

## 2023-10-05 NOTE — Progress Notes (Signed)
 Subjective:     Kristy [name] is a 78 y.o. female here for a routine exam.  Current complaints: Pt has been helping assist son and husband both have mult chronic conditions. Pt has no new complaints. She denies issues with urge incontinence. Has been walking with a cane due to Meniere ds.   Has had falls related to that. Did have a fx of her left knee after fall.     + FH of breast cancer in mother and sisters. Her ovaries have been removed.   Gynecologic History No LMP recorded. Patient has had a hysterectomy. Contraception: post menopausal status Last Pap: 10/2008. Results were: normal Last mammogram: 06/10/2023. Results were: normal  Obstetric History OB History  Gravida Para Term Preterm AB Living  2 2    2   SAB IAB Ectopic Multiple Live Births          # Outcome Date GA Lbr Len/2nd Weight Sex Type Anes PTL Lv  2 Para           1 Para              The following portions of the patient's history were reviewed and updated as appropriate: allergies, current medications, past family history, past medical history, past social history, past surgical history, and problem list.  Review of Systems Pertinent items are noted in HPI.    Objective:  BP 123/62   Pulse 97   Ht 5\' 5"  (1.651 m)   Wt 223 lb (101.2 kg)   BMI 37.11 kg/m   General Appearance:    Alert, cooperative, no distress, appears stated age  Head:    Normocephalic, without obvious abnormality, atraumatic  Eyes:    conjunctiva/corneas clear, EOM's intact, both eyes  Ears:    Normal external ear canals, both ears  Nose:   Nares normal, septum midline, mucosa normal, no drainage    or sinus tenderness  Throat:   Lips, mucosa, and tongue normal; teeth and gums normal  Neck:   Supple, symmetrical, trachea midline, no adenopathy;    thyroid :  no enlargement/tenderness/nodules  Back:     Symmetric, no curvature, ROM normal, no CVA tenderness  Lungs:     respirations unlabored  Chest Wall:    No tenderness or deformity    Heart:    Regular rate and rhythm  Breast Exam:    No tenderness, masses, or nipple abnormality  Abdomen:     Soft, non-tender, bowel sounds active all four quadrants,    no masses, no organomegaly  Genitalia:    Normal female without lesion, discharge or tenderness   Uterus and cervix surgically absent.    Extremities:   Extremities normal, atraumatic, no cyanosis or edema  Pulses:   2+ and symmetric all extremities  Skin:   Skin color, texture, turgor normal, no rashes or lesions     Assessment:    Healthy female exam.   At high risk for osteoporosis. Pt taking Vit D and Ca++. Review exercise (strength training and chair)      Plan:  Britzel was seen today for gynecologic exam.  Diagnoses and all orders for this visit:  Well female exam with routine gynecological exam  At high risk for osteoporosis -     DG Bone Density; Future   F/u in 1 year or sooner prn   Charna Neeb L. Harraway-Smith, M.D., FACOG

## 2023-10-07 ENCOUNTER — Ambulatory Visit: Payer: Medicare Other

## 2023-10-17 DIAGNOSIS — Z6837 Body mass index (BMI) 37.0-37.9, adult: Secondary | ICD-10-CM | POA: Diagnosis not present

## 2023-10-17 DIAGNOSIS — E669 Obesity, unspecified: Secondary | ICD-10-CM | POA: Diagnosis not present

## 2023-10-17 DIAGNOSIS — M797 Fibromyalgia: Secondary | ICD-10-CM | POA: Diagnosis not present

## 2023-10-17 DIAGNOSIS — M1991 Primary osteoarthritis, unspecified site: Secondary | ICD-10-CM | POA: Diagnosis not present

## 2023-10-17 DIAGNOSIS — M65321 Trigger finger, right index finger: Secondary | ICD-10-CM | POA: Diagnosis not present

## 2023-10-17 DIAGNOSIS — M1A09X0 Idiopathic chronic gout, multiple sites, without tophus (tophi): Secondary | ICD-10-CM | POA: Diagnosis not present

## 2023-10-17 DIAGNOSIS — R945 Abnormal results of liver function studies: Secondary | ICD-10-CM | POA: Diagnosis not present

## 2023-10-20 ENCOUNTER — Other Ambulatory Visit: Payer: Self-pay | Admitting: Internal Medicine

## 2023-10-28 ENCOUNTER — Encounter: Payer: Self-pay | Admitting: Internal Medicine

## 2023-10-28 ENCOUNTER — Ambulatory Visit: Payer: Medicare Other | Admitting: Internal Medicine

## 2023-10-28 VITALS — BP 126/72 | HR 85 | Temp 97.9°F | Resp 18 | Ht 65.0 in | Wt 224.4 lb

## 2023-10-28 DIAGNOSIS — M109 Gout, unspecified: Secondary | ICD-10-CM

## 2023-10-28 DIAGNOSIS — I10 Essential (primary) hypertension: Secondary | ICD-10-CM

## 2023-10-28 DIAGNOSIS — E559 Vitamin D deficiency, unspecified: Secondary | ICD-10-CM | POA: Diagnosis not present

## 2023-10-28 DIAGNOSIS — Z7984 Long term (current) use of oral hypoglycemic drugs: Secondary | ICD-10-CM

## 2023-10-28 DIAGNOSIS — R296 Repeated falls: Secondary | ICD-10-CM

## 2023-10-28 DIAGNOSIS — E1140 Type 2 diabetes mellitus with diabetic neuropathy, unspecified: Secondary | ICD-10-CM | POA: Diagnosis not present

## 2023-10-28 DIAGNOSIS — E1169 Type 2 diabetes mellitus with other specified complication: Secondary | ICD-10-CM | POA: Diagnosis not present

## 2023-10-28 LAB — LIPID PANEL
Cholesterol: 109 mg/dL (ref 0–200)
HDL: 44.2 mg/dL (ref >39.00)
LDL Cholesterol: 34 mg/dL (ref 0–99)
NonHDL: 64.52
Total CHOL/HDL Ratio: 2
Triglycerides: 155 mg/dL — ABNORMAL HIGH (ref 0.0–149.0)
VLDL: 31 mg/dL (ref 0.0–40.0)

## 2023-10-28 LAB — HEMOGLOBIN A1C: Hgb A1c MFr Bld: 7 % — ABNORMAL HIGH (ref 4.6–6.5)

## 2023-10-28 LAB — URIC ACID: Uric Acid, Serum: 2.5 mg/dL (ref 2.4–7.0)

## 2023-10-28 LAB — CBC WITH DIFFERENTIAL/PLATELET
Basophils Absolute: 0.1 10*3/uL (ref 0.0–0.1)
Basophils Relative: 1 % (ref 0.0–3.0)
Eosinophils Absolute: 0.2 10*3/uL (ref 0.0–0.7)
Eosinophils Relative: 3.1 % (ref 0.0–5.0)
HCT: 44.6 % (ref 36.0–46.0)
Hemoglobin: 14.3 g/dL (ref 12.0–15.0)
Lymphocytes Relative: 31.9 % (ref 12.0–46.0)
Lymphs Abs: 2.2 10*3/uL (ref 0.7–4.0)
MCHC: 32 g/dL (ref 30.0–36.0)
MCV: 80.2 fl (ref 78.0–100.0)
Monocytes Absolute: 0.7 10*3/uL (ref 0.1–1.0)
Monocytes Relative: 9.9 % (ref 3.0–12.0)
Neutro Abs: 3.7 10*3/uL (ref 1.4–7.7)
Neutrophils Relative %: 54.1 % (ref 43.0–77.0)
Platelets: 276 10*3/uL (ref 150.0–400.0)
RBC: 5.56 Mil/uL — ABNORMAL HIGH (ref 3.87–5.11)
RDW: 17.3 % — ABNORMAL HIGH (ref 11.5–15.5)
WBC: 6.9 10*3/uL (ref 4.0–10.5)

## 2023-10-28 LAB — BASIC METABOLIC PANEL WITH GFR
BUN: 19 mg/dL (ref 6–23)
CO2: 27 meq/L (ref 19–32)
Calcium: 10.7 mg/dL — ABNORMAL HIGH (ref 8.4–10.5)
Chloride: 98 meq/L (ref 96–112)
Creatinine, Ser: 1.04 mg/dL (ref 0.40–1.20)
GFR: 51.32 mL/min — ABNORMAL LOW (ref >60.00)
Glucose, Bld: 119 mg/dL — ABNORMAL HIGH (ref 70–99)
Potassium: 4.5 meq/L (ref 3.5–5.1)
Sodium: 138 meq/L (ref 135–145)

## 2023-10-28 LAB — VITAMIN D 25 HYDROXY (VIT D DEFICIENCY, FRACTURES): VITD: 37.03 ng/mL (ref 30.00–100.00)

## 2023-10-28 LAB — AST: AST: 15 U/L (ref 0–37)

## 2023-10-28 LAB — ALT: ALT: 15 U/L (ref 0–35)

## 2023-10-28 NOTE — Patient Instructions (Signed)
 Diabetes GOALS: Continue checking your blood sugars - early in AM fasting  ( blood sugar goal 70-130) - 2 hours after a meal (blood sugar goal less than 180) - bedtime (goal 90-150)  Continue checking your blood pressure regularly Blood pressure goal:  between 110/65 and  135/85. If it is consistently higher or lower, let me know  Please read information about feet care  GO TO THE LAB :  Get the blood work   Your results will be posted on MyChart with my comments   Next office visit for a checkup in 4 months Please make an appointment before you leave today    Diabetes Mellitus and Foot Care Diabetes, also called diabetes mellitus, may cause problems with your feet and legs because of poor blood flow (circulation). Poor circulation may make your skin: Become thinner and drier. Break more easily. Heal more slowly. Peel and crack. You may also have nerve damage (neuropathy). This can cause decreased feeling in your legs and feet. This means that you may not notice minor injuries to your feet that could lead to more serious problems. Finding and treating problems early is the best way to prevent future foot problems. How to care for your feet Foot hygiene  Wash your feet daily with warm water and mild soap. Do not use hot water. Then, pat your feet and the areas between your toes until they are fully dry. Do not soak your feet. This can dry your skin. Trim your toenails straight across. Do not dig under them or around the cuticle. File the edges of your nails with an emery board or nail file. Apply a moisturizing lotion or petroleum jelly to the skin on your feet and to dry, brittle toenails. Use lotion that does not contain alcohol and is unscented. Do not apply lotion between your toes. Shoes and socks Wear clean socks or stockings every day. Make sure they are not too tight. Do not wear knee-high stockings. These may decrease blood flow to your legs. Wear shoes that fit well and  have enough cushioning. Always look in your shoes before you put them on to be sure there are no objects inside. To break in new shoes, wear them for just a few hours a day. This prevents injuries on your feet. Wounds, scrapes, corns, and calluses  Check your feet daily for blisters, cuts, bruises, sores, and redness. If you cannot see the bottom of your feet, use a mirror or ask someone for help. Do not cut off corns or calluses or try to remove them with medicine. If you find a minor scrape, cut, or break in the skin on your feet, keep it and the skin around it clean and dry. You may clean these areas with mild soap and water. Do not clean the area with peroxide, alcohol, or iodine. If you have a wound, scrape, corn, or callus on your foot, look at it several times a day to make sure it is healing and not infected. Check for: Redness, swelling, or pain. Fluid or blood. Warmth. Pus or a bad smell. General tips Do not cross your legs. This may decrease blood flow to your feet. Do not use heating pads or hot water bottles on your feet. They may burn your skin. If you have lost feeling in your feet or legs, you may not know this is happening until it is too late. Protect your feet from hot and cold by wearing shoes, such as at the beach or  on hot pavement. Schedule a complete foot exam at least once a year or more often if you have foot problems. Report any cuts, sores, or bruises to your health care provider right away. Where to find more information American Diabetes Association: diabetes.org Association of Diabetes Care & Education Specialists: diabeteseducator.org Contact a health care provider if: You have a condition that increases your risk of infection, and you have any cuts, sores, or bruises on your feet. You have an injury that is not healing. You have redness on your legs or feet. You feel burning or tingling in your legs or feet. You have pain or cramps in your legs and  feet. Your legs or feet are numb. Your feet always feel cold. You have pain around any toenails. Get help right away if: You have a wound, scrape, corn, or callus on your foot and: You have signs of infection. You have a fever. You have a red line going up your leg. This information is not intended to replace advice given to you by your health care provider. Make sure you discuss any questions you have with your health care provider. Document Revised: 11/18/2021 Document Reviewed: 11/18/2021 Elsevier Patient Education  2024 [employer].

## 2023-10-28 NOTE — Assessment & Plan Note (Signed)
 DM: Currently on pioglitazone , metformin , Jardiance , checking A1c. Ambulatory CBGs 120s.  140s. Reports increased appetite, switch Actos  to a GLP-1's could be an option, for now she would like to think about it. Neuropathy: New issue, reports nocturnal feet numbness, for now symptoms controlled by a special pair of socks she got. Frequent falls: The patient thinks is a combination of Mnire's and MSK issue.  She also has neuropathy.  Offered PT: Declined it.  Precautions discussed. HTN: Reports ambulatory BPs in the 120/70.  Continue Cardizem , losartan , metoprolol , check BMP and CBC. Hyperlipidemia: On atorvastatin , check FLP. Gout: On Uloric , sees rheumatology, no symptoms, request uric acid levels.  Will do Preventive care reviewed  RTC 4 months

## 2023-10-28 NOTE — Progress Notes (Signed)
 Subjective:    Patient ID: Kristy [name], female    DOB: [birth year], 78 y.o.   MRN: [id number]  DOS:  10/28/2023 Type of visit - description: ROV  Chronic medical problems addressed. Saw rheumatology, note reviewed.  Complaining of feet numbness, mostly at night  Reported frequent falls, she thinks related to MSK issues, had knee replacements.  Also  has fallen few times when she turns quickly.  Denies any major injuries.  Denies chest pain or difficulty with respiration (other than allergies). No edema or palpitations No nausea or vomiting.  No blood in the stools  Wt Readings from Last 3 Encounters:  10/28/23 224 lb 6 oz (101.8 kg)  10/05/23 223 lb (101.2 kg)  08/25/23 220 lb (99.8 kg)     Review of Systems See above   Past Medical History:  Diagnosis Date   Allergy    Environmental, foods: shellfish, lactose intolerant, citrus, peanuts   Asthma    mild persistent   Atrophic vaginitis    BRCA gene mutation negative 2015   testing done at [place]   Cataract    had corrective cataract surgery both eyes   Diabetes mellitus without complication (HCC)    Type 2   DJD (degenerative joint disease)    generalized joint pain, including back   Dysrhythmia    SVT   Family history of malignant neoplasm of breast    Fibromyalgia    Dr Ebbie Goldmann   GERD (gastroesophageal reflux disease)    Gout    Idiopathic, chronic   Hiatal hernia    History of kidney stones    Hyperlipidemia    Hypertension    Meniere's disease    R side worse, L side starting, see's Dr. Darlin Ehrlich.   Palpitations    PSVT on Holter monitoring   Shortness of breath    occ   Stroke [facility])    TIA  2014   SUI (stress urinary incontinence, female)    (resolved after surgery)   TIA (transient ischemic attack)    07/2012, see Dr Janett Medin   Vertigo     Past Surgical History:  Procedure Laterality Date   ABDOMINAL HYSTERECTOMY  1994   TAH,BSO-BLADDER NECK SUSPENSION   BREAST SURGERY      LUMPECTOMY-BENIGN Breast Bx X2   CARPAL TUNNEL RELEASE Right 2005   CATARACT EXTRACTION, BILATERAL Bilateral 2016   Cataract    CHOLECYSTECTOMY     CHOLECYSTECTOMY, LAPAROSCOPIC  2001   CYSTOCELE REPAIR  1994   EYE SURGERY     JOINT REPLACEMENT     bilatral knees;    KNEE ARTHROPLASTY  05/19/2012   Procedure: COMPUTER ASSISTED TOTAL KNEE ARTHROPLASTY;  Surgeon: Adah Acron, MD;  Location: [facility] OR;  Service: Orthopedics;  Laterality: Left;  Left  Total Knee Arthroplasty   KNEE ARTHROPLASTY Right 09/03/2013   Procedure: COMPUTER ASSISTED TOTAL KNEE ARTHROPLASTY;  Surgeon: Adah Acron, MD;  Location: [facility] OR;  Service: Orthopedics;  Laterality: Right;  Right Total Knee Arthroplasty, Computer Assist   KNEE ARTHROSCOPY     bil   LUMBAR LAMINECTOMY/DECOMPRESSION MICRODISCECTOMY N/A 08/18/2020   Procedure: L4-5 decompression, possible left L4-5 microdiscectomy;  Surgeon: Adah Acron, MD;  Location: [facility];  Service: Orthopedics;  Laterality: N/A;   OOPHORECTOMY     BSO   SPINE SURGERY  07/2020   SURGERY FOR POSERIOR TIBIAL TENDON  2003   TONSILLECTOMY     TRIPLE FUSION LEFT ANKLE Left 2007   TUBAL LIGATION  Current Outpatient Medications  Medication Instructions   aspirin  EC 81 mg, Daily   atorvastatin  (LIPITOR) 10 mg, Oral, Daily at bedtime   Calcium  Carbonate (CALCIUM  600 PO) 600 mg, 2 times daily   Cholecalciferol  2,000 Units, Daily at bedtime   diltiazem  (CARDIZEM  CD) 240 mg, Oral, Daily   empagliflozin  (JARDIANCE ) 10 mg, Oral, Daily before breakfast   EPINEPHrine  (EPI-PEN) 0.3 mg, See admin instructions   febuxostat  (ULORIC ) 40 mg, Every evening   fluticasone  (FLONASE ) 50 MCG/ACT nasal spray 2 sprays, Every morning   fluticasone  (FLOVENT  HFA) 110 MCG/ACT inhaler 2 puffs, Daily   ipratropium (ATROVENT) 0.03 % nasal spray 3 times daily PRN   levalbuterol  (XOPENEX  HFA) 45 MCG/ACT inhaler 2 puffs, As needed   levocetirizine (XYZAL ) 5 mg, Daily-1600   losartan  (COZAAR ) 25 mg,  Oral, Daily   metFORMIN  (GLUCOPHAGE ) 1,275 mg, Oral, 2 times daily with meals   metoprolol  succinate (TOPROL  XL) 37.5 mg, Oral, Daily   nortriptyline  (PAMELOR ) 25 mg, Daily-1600   omeprazole  (PRILOSEC) 40 mg, Oral, Daily   pioglitazone  (ACTOS ) 45 mg, Oral, Daily   vitamin E  400 Units, Daily-1600       Objective:   Physical Exam BP 126/72   Pulse 85   Temp 97.9 F (36.6 C) (Oral)   Resp 18   Ht 5\' 5"  (1.651 m)   Wt 224 lb 6 oz (101.8 kg)   SpO2 97%   BMI 37.34 kg/m  General: Well developed, NAD, BMI noted Neck: No  thyromegaly  HEENT:  Normocephalic . Face symmetric, atraumatic Lungs:  CTA B Normal respiratory effort, no intercostal retractions, no accessory muscle use. Heart: RRR,  no murmur.  Abdomen:  Not distended, soft, non-tender. No rebound or rigidity.   DM foot exam: No edema, good pedal pulses, pinprick examination normal Skin: Exposed areas without rash. Not pale. Not jaundice Neurologic:  alert & oriented X3.  Speech normal, gait appropriate for age and unassisted Strength symmetric and appropriate for age.  Psych: Cognition and judgment appear intact.  Cooperative with normal attention span and concentration.  Behavior appropriate. No anxious or depressed appearing.     Assessment     Assessment DM w/ h/o TIA Neuropathy Dx 09/2023 HTN  Hyperlipidemia  Insomnia  Asthma , allergies-- sees Dr Seabron [place]  GERD, hiatal hernia , h/o IBS Obesity  CV: ---Palpitations: PSVT by Holter, Dr Katheryne Pane ---TIA, dx 2014,last visit w/  Dr. Janett Medin 09-2015, RTC prn ENT: Dr Anise Kerns -Vertigo (improved with vestibular rehabilitation 2015) -HOH- aids, brain  MRI 03/2019.   -Menier's (Dx   2020) MSK: *Osteopenia: dexa per Gyn *Fibromyalgia, DJD, Gout : per Dr. Ebbie Goldmann, on Pamelor   *Hyperparathyroidism h/o slt increased PTH 2015 , normal PTH 05-2014  GU:  --Kidney stones, h/o; Urinary incontinence; Atrophic vaginitis OSA dx 08-2021, started CPAP 10/2021 FH breast cancer ,  sister is BRCA2 (+), pt is  (-) Increased LFTs --- CT abdomen 2014: NormalLiver, negative hepatitis serologies at rheumatology per patient  PLAN: DM: Currently on pioglitazone , metformin , Jardiance , checking A1c. Ambulatory CBGs 120s.  140s. Reports increased appetite, switch Actos  to a GLP-1's could be an option, for now she would like to think about it. Neuropathy: New issue, reports nocturnal feet numbness, for now symptoms controlled by a special pair of socks she got. Frequent falls: The patient thinks is a combination of Mnire's and MSK issue.  She also has neuropathy.  Offered PT: Declined it.  Precautions discussed. HTN: Reports ambulatory BPs in the 120/70.  Continue  Cardizem , losartan , metoprolol , check BMP and CBC. Hyperlipidemia: On atorvastatin , check FLP. Gout: On Uloric , sees rheumatology, no symptoms, request uric acid levels.  Will do Preventive care reviewed: -Td 2023 -PNM 23: 2011, 2022; PNM 13: 2015 - S/p Shingrix , had a RSV 2024 per pt  - had a covid vax 2024,fall - Rec , flu shot every fall  Female care: Saw gynecology 09/2023 +FH  of breast cancer (pt is BRCA negative) [facility] 06/2023 ([facility]) - CCS: s/p colonoscopy in 2012, colonoscopy 11-2015, cscope 02-2021, next 3 years per GI letter.  Patient aware. RTC 4 months

## 2023-10-28 NOTE — Assessment & Plan Note (Signed)
 Preventive care reviewed: -Td 2023 -PNM 23: 2011, 2022; PNM 13: 2015 - S/p Shingrix , had a RSV 2024 per pt  - had a covid vax 2024,fall - Rec , flu shot every fall  Female care: Saw gynecology 09/2023 +FH  of breast cancer (pt is BRCA negative) [facility] 06/2023 ([facility]) - CCS: s/p colonoscopy in 2012, colonoscopy 11-2015, cscope 02-2021, next 3 years per GI letter.  Patient aware.

## 2023-10-31 ENCOUNTER — Ambulatory Visit: Payer: Self-pay | Admitting: Internal Medicine

## 2023-11-02 ENCOUNTER — Ambulatory Visit: Admitting: *Deleted

## 2023-11-02 VITALS — Ht 65.0 in | Wt 228.8 lb

## 2023-11-02 DIAGNOSIS — Z0000 Encounter for general adult medical examination without abnormal findings: Secondary | ICD-10-CM | POA: Diagnosis not present

## 2023-11-02 NOTE — Progress Notes (Signed)
 Subjective:   Kristy [name] is a 78 y.o. who presents for a Medicare Wellness preventive visit.  As a reminder, Annual Wellness Visits don't include a physical exam, and some assessments may be limited, especially if this visit is performed virtually. We may recommend an in-person follow-up visit with your provider if needed.  Visit Complete: In person   Persons Participating in Visit: Patient.  AWV Questionnaire: No: Patient Medicare AWV questionnaire was not completed prior to this visit.  Cardiac Risk Factors include: diabetes mellitus;hypertension;obesity (BMI >30kg/m2)     Objective:     Today's Vitals   11/02/23 1054  Weight: 228 lb 12.8 oz (103.8 kg)  Height: 5\' 5"  (1.651 m)   Body mass index is 38.07 kg/m.     11/02/2023   11:20 AM 10/05/2022    2:54 PM 10/07/2021   12:01 PM 10/01/2021    2:41 PM 09/18/2021    4:28 AM 12/12/2020    1:59 PM 09/25/2020    1:59 PM  Advanced Directives  Does Patient Have a Medical Advance Directive? Yes Yes Yes Yes Yes Yes Yes  Type of Advance Directive Living will;Healthcare Power of [employer] Power of [place];Living will Living will;Healthcare Power of [employer] Power of [place];Out of facility DNR (pink MOST or yellow form);Living will Healthcare Power of [place] [employer] [place];Living will Healthcare Power of [place];Living will Healthcare Power of [place];Living will  Does patient want to make changes to medical advance directive? No - Patient declined No - Patient declined No - Patient declined   No - Patient declined   Copy of Healthcare Power of Attorney in Chart? No - copy requested No - copy requested No - copy requested No - copy requested   No - copy requested    Current Medications (verified) Outpatient Encounter Medications as of 11/02/2023  Medication Sig   aspirin  EC 81 MG tablet Take 81 mg by mouth daily.   atorvastatin  (LIPITOR) 10 MG tablet Take 1 tablet (10 mg total) by mouth at bedtime.   Calcium  Carbonate  (CALCIUM  600 PO) Take 600 mg by mouth in the morning and at bedtime. (1600 & bedtime)   Cholecalciferol  50 MCG (2000 UT) CAPS Take 2,000 Units by mouth at bedtime.    diltiazem  (CARDIZEM  CD) 240 MG 24 hr capsule Take 1 capsule (240 mg total) by mouth daily.   empagliflozin  (JARDIANCE ) 10 MG TABS tablet Take 1 tablet (10 mg total) by mouth daily before breakfast.   EPINEPHrine  (EPI-PEN) 0.3 mg/0.3 mL DEVI Inject 0.3 mg into the muscle See admin instructions. Inject 0.3 mg intramuscularly in the leg as needed for severe allergic reaction, call 911 and then inject again in the other leg. Reported on 06/26/2015   febuxostat  (ULORIC ) 40 MG tablet Take 40 mg by mouth every evening.    fluticasone  (FLONASE ) 50 MCG/ACT nasal spray Place 2 sprays into both nostrils in the morning.   fluticasone  (FLOVENT  HFA) 110 MCG/ACT inhaler Inhale 2 puffs into the lungs daily.   ipratropium (ATROVENT) 0.03 % nasal spray Place into the nose 3 (three) times daily as needed.   levalbuterol  (XOPENEX  HFA) 45 MCG/ACT inhaler Inhale 2 puffs into the lungs as needed for shortness of breath.   levocetirizine (XYZAL ) 5 MG tablet Take 5 mg by mouth daily at 4 PM.   losartan  (COZAAR ) 25 MG tablet TAKE 1 TABLET (25 MG TOTAL) BY MOUTH DAILY.   metFORMIN  (GLUCOPHAGE ) 850 MG tablet Take 1.5 tablets (1,275 mg total) by mouth 2 (two) times daily  with a meal.   metoprolol  succinate (TOPROL  XL) 25 MG 24 hr tablet Take 1.5 tablets (37.5 mg total) by mouth daily.   nortriptyline  (PAMELOR ) 25 MG capsule Take 25 mg by mouth daily at 4 PM.   omeprazole  (PRILOSEC) 40 MG capsule Take 1 capsule (40 mg total) by mouth daily.   pioglitazone  (ACTOS ) 45 MG tablet Take 1 tablet (45 mg total) by mouth daily.   vitamin E  400 UNIT capsule Take 400 Units by mouth daily at 4 PM.   No facility-administered encounter medications on file as of 11/02/2023.    Allergies (verified) Iodine, Shellfish allergy, Sulfonamide derivatives, Cephalexin, Citrus, Lactose  intolerance (gi), and Amoxicillin   History: Past Medical History:  Diagnosis Date   Allergy    Environmental, foods: shellfish, lactose intolerant, citrus, peanuts   Asthma    mild persistent   Atrophic vaginitis    BRCA gene mutation negative 2015   testing done at [facility] long   Cataract 2016   had corrective cataract surgery both eyes   Diabetes mellitus without complication (HCC)    Type 2   DJD (degenerative joint disease)    generalized joint pain, including back   Dysrhythmia    SVT   Family history of malignant neoplasm of breast    Fibromyalgia    Dr Ebbie Goldmann   GERD (gastroesophageal reflux disease)    Gout    Idiopathic, chronic   Hiatal hernia    History of kidney stones    Hyperlipidemia    Hypertension    Meniere's disease    R side worse, L side starting, see's Dr. Darlin Ehrlich.   Palpitations    PSVT on Holter monitoring   Shortness of breath    occ   Stroke [facility])    TIA  2014   SUI (stress urinary incontinence, female)    (resolved after surgery)   TIA (transient ischemic attack)    07/2012, see Dr Janett Medin   Vertigo    Past Surgical History:  Procedure Laterality Date   ABDOMINAL HYSTERECTOMY  1994   TAH,BSO-BLADDER NECK SUSPENSION   BREAST SURGERY     LUMPECTOMY-BENIGN Breast Bx X2   CARPAL TUNNEL RELEASE Right 2005   CATARACT EXTRACTION, BILATERAL Bilateral 2016   Cataract    CHOLECYSTECTOMY     CHOLECYSTECTOMY, LAPAROSCOPIC  2001   CYSTOCELE REPAIR  1994   EYE SURGERY     cataracts   JOINT REPLACEMENT     bilatral knees;    KNEE ARTHROPLASTY  05/19/2012   Procedure: COMPUTER ASSISTED TOTAL KNEE ARTHROPLASTY;  Surgeon: Adah Acron, MD;  Location: [facility] OR;  Service: Orthopedics;  Laterality: Left;  Left  Total Knee Arthroplasty   KNEE ARTHROPLASTY Right 09/03/2013   Procedure: COMPUTER ASSISTED TOTAL KNEE ARTHROPLASTY;  Surgeon: Adah Acron, MD;  Location: [facility] OR;  Service: Orthopedics;  Laterality: Right;  Right Total Knee Arthroplasty, Computer  Assist   KNEE ARTHROSCOPY     bil   LUMBAR LAMINECTOMY/DECOMPRESSION MICRODISCECTOMY N/A 08/18/2020   Procedure: L4-5 decompression, possible left L4-5 microdiscectomy;  Surgeon: Adah Acron, MD;  Location: [facility] OR;  Service: Orthopedics;  Laterality: N/A;   OOPHORECTOMY     BSO   SPINE SURGERY  07/2020   SURGERY FOR POSERIOR TIBIAL TENDON  2003   TONSILLECTOMY     TRIPLE FUSION LEFT ANKLE Left 2007   TUBAL LIGATION     Family History  Problem Relation Age of Onset   Breast cancer Mother 10  deceased 29   Arthritis Mother    Cancer Mother    Cancer Father        Brain tumor; path?; deceased 24   Breast cancer Sister 15       bilateral breast ca @ 46; BRCA2 positive   Hypertension Sister    Cancer Sister    Hemochromatosis Sister    Thalassemia Sister    Other Sister        negative for BRCA1 BRCA2   Birth defects Sister    Cancer Sister    Heart failure Sister        due to chemo?   Lymphoma Maternal Aunt        deceased 55s   Cancer Maternal Uncle    CAD Son        MI   Diabetes Son    Leukemia Son    Breast cancer Cousin        BRCA2 positive   Diabetes Other    Breast cancer Other        distant maternal female relatives with breast cancer   Diabetes Maternal Grandmother    Cancer Son    Diabetes Son    Colon cancer Neg Hx    Colon polyps Neg Hx    Esophageal cancer Neg Hx    Rectal cancer Neg Hx    Stomach cancer Neg Hx    Social History   Socioeconomic History   Marital status: Married    Spouse name: Josefina Nian   Number of children: 2   Years of education: 16   Highest education level: Some college, no degree  Occupational History   Occupation: Location manager-- retired     Associate Professor: RETIRED  Tobacco Use   Smoking status: Never   Smokeless tobacco: Never  Vaping Use   Vaping status: Never Used  Substance and Sexual Activity   Alcohol use: No   Drug use: No   Sexual activity: Not Currently    Birth control/protection: Surgical  Other Topics  Concern   Not on file  Social History Narrative   HSG   1 year college for accounting. married '69. 2 sons - '70, '72: retired - [employer].     ACP - OK for CPR at least once, no long term ventilation, no heroic measures in the face of poor quality of life. HCPOA - husband, secondary son [employer] (c) [phone]   No Cafffeine (or very rare).    Right Handed       Social Drivers of Health   Financial Resource Strain: Low Risk  (11/02/2023)   Overall Financial Resource Strain (CARDIA)    Difficulty of Paying Living Expenses: Not very hard  Food Insecurity: No Food Insecurity (11/02/2023)   Hunger Vital Sign    Worried About Running Out of Food in the Last Year: Never true    Ran Out of Food in the Last Year: Never true  Transportation Needs: No Transportation Needs (11/02/2023)   PRAPARE - Administrator, Civil Service (Medical): No    Lack of Transportation (Non-Medical): No  Physical Activity: Inactive (11/02/2023)   Exercise Vital Sign    Days of Exercise per Week: 0 days    Minutes of Exercise per Session: 0 min  Stress: No Stress Concern Present (11/02/2023)   [employer] of Occupational Health - Occupational Stress Questionnaire    Feeling of Stress : Only a little  Social Connections: Moderately Isolated (11/02/2023)   Social Connection and Isolation  Panel [NHANES]    Frequency of Communication with Friends and Family: More than three times a week    Frequency of Social Gatherings with Friends and Family: Once a week    Attends Religious Services: Never    Database administrator or Organizations: No    Attends Engineer, structural: Never    Marital Status: Married    Tobacco Counseling Counseling given: Not Answered    Clinical Intake:  Pre-visit preparation completed: Yes  Pain : No/denies pain     BMI - recorded: 38.07 Nutritional Status: BMI > 30  Obese Nutritional Risks: Other (Comment) Diabetes: Yes CBG done?: No Did pt. bring in CBG  monitor from home?: No  Lab Results  Component Value Date   HGBA1C 7.0 (H) 10/28/2023   HGBA1C 6.9 (H) 05/30/2023   HGBA1C 6.8 (H) 12/27/2022     How often do you need to have someone help you when you read instructions, pamphlets, or other written materials from your doctor or pharmacy?: 1 - Never What is the last grade level you completed in school?: took accounting classes in college  Interpreter Needed?: No  Information entered by :: Susa Engman, CMA   Activities of Daily Living     11/02/2023   11:07 AM 11/02/2023   11:05 AM  In your present state of health, do you have any difficulty performing the following activities:  Hearing? 1 0  Comment has bilateral hearing aids that are broken and waiting new pair   Vision? 0 0  Comment denies difficulty, wears reading glasses. Last eye exam on 07/27/23 with Princella Brooklyn in [place].   Difficulty concentrating or making decisions? 0 0  Walking or climbing stairs? 0 0  Dressing or bathing? 0 0  Doing errands, shopping? 0 0  Preparing Food and eating ? N N  Using the Toilet? N N  In the past six months, have you accidently leaked urine? Kristy [name]  Comment sees gyn and under treatmetn Has been in therapy with GYN for urine leakage  Do you have problems with loss of bowel control? N N  Managing your Medications? N N  Managing your Finances? N N  Housekeeping or managing your Housekeeping? N N    Patient Care Team: Ezell Hollow, MD as PCP - General (Internal Medicine) Wendie Hamburg, MD as PCP - Cardiology (Cardiology) Alanson Alliance, MD (Rheumatology) Avanell Leigh, MD as Consulting Physician (Cardiology) Vincenza Greener, Counselor (Inactive) (Genetic Counselor) Lisabeth Rider, MD as Consulting Physician (Neurology) Seabron Cypress, Timmothy Foots, MD as Consulting Physician (Allergy and Immunology) Reynold Caves, MD as Consulting Physician (Otolaryngology) Princella Brooklyn, OD (Optometry)  I have updated your Care Teams any recent  Medical Services you may have received from other providers in the past year.     Assessment:    This is a routine wellness examination for Kristy [name].  Hearing/Vision screen Hearing Screening - Comments:: Denies difficulty Vision Screening - Comments:: Last exam with Princella Brooklyn 07/27/23.   Goals Addressed             This Visit's Progress    Increase physical activity   Not on track      Depression Screen     10/28/2023   10:45 AM 10/05/2023    1:05 PM 05/30/2023   10:28 AM 12/27/2022    1:24 PM 10/05/2022    3:03 PM 08/26/2022   10:18 AM 03/29/2022   10:33 AM  PHQ 2/9 Scores  PHQ -  2 Score 0 0 0 0 2 0 0  PHQ- 9 Score 3 3 2 2       No changes since 10/28/23  Fall Risk     10/28/2023   10:14 AM 05/30/2023   10:03 AM 12/27/2022   12:54 PM 10/05/2022    3:10 PM 08/26/2022   10:18 AM  Fall Risk   Falls in the past year? 1 0 1 1 1   Number falls in past yr: 1 0 0 1 1  Injury with Fall? 0 0 0 0 0  Risk for fall due to :    Impaired balance/gait;History of fall(s)   Follow up Falls evaluation completed;Education provided Falls evaluation completed;Education provided Falls evaluation completed Falls evaluation completed;Falls prevention discussed Falls evaluation completed  No changes since 10/28/23.  MEDICARE RISK AT HOME:  Medicare Risk at Home Any stairs in or around the home?: No If so, are there any without handrails?: No Home free of loose throw rugs in walkways, pet beds, electrical cords, etc?: Yes Adequate lighting in your home to reduce risk of falls?: Yes Life alert?: No Use of a cane, walker or w/c?: Yes (cane, has walker that is rarely used) Grab bars in the bathroom?: Yes Shower chair or bench in shower?: Yes Elevated toilet seat or a handicapped toilet?: Yes  TIMED UP AND GO:  Was the test performed?  Yes  Length of time to ambulate 10 feet: 10 sec Gait steady and fast with assistive device  Cognitive Function: 6CIT completed        11/02/2023   11:25 AM  10/05/2022    3:14 PM 10/01/2021    2:55 PM 09/25/2020    2:07 PM  6CIT Screen  What Year?  0 points 0 points 0 points  What month?  0 points 0 points 0 points  What time? 0 points 0 points 0 points 0 points  Count back from 20 0 points 0 points 0 points 0 points  Months in reverse 0 points 0 points 0 points 0 points  Repeat phrase 0 points 0 points 0 points 0 points  Total Score  0 points 0 points 0 points    Immunizations Immunization History  Administered Date(s) Administered   Fluad Quad(high Dose 65+) 02/16/2019, 03/19/2020   Influenza Whole 02/28/2009, 02/20/2010, 04/26/2012   Influenza, High Dose Seasonal PF 02/11/2015, 03/30/2016, 03/28/2017, 03/09/2018   Influenza,inj,Quad PF,6+ Mos 02/19/2014, 03/09/2021   Influenza-Unspecified 04/20/2013, 03/26/2022, 02/24/2023   Moderna Sars-Covid-2 Vaccination 07/13/2019, 08/10/2019, 03/29/2020   Pfizer Covid-19 Vaccine Bivalent Booster 68yrs & up 02/13/2021, 02/20/2022   Pfizer(Comirnaty)Fall Seasonal Vaccine 12 years and older 03/07/2023   Pneumococcal Conjugate-13 06/05/2013   Pneumococcal Polysaccharide-23 02/20/2010, 07/11/2020, 04/02/2021   Respiratory Syncytial Virus Vaccine,Recomb Aduvanted(Arexvy) 02/20/2022   Tdap 05/05/2011, 01/25/2022   Zoster Recombinant(Shingrix ) 01/25/2022, 03/26/2022    Screening Tests Health Maintenance  Topic Date Due   Medicare Annual Wellness (AWV)  10/05/2023   COVID-19 Vaccine (7 - 2024-25 season) 10/26/2024 (Originally 09/05/2023)   INFLUENZA VACCINE  12/30/2023   Colonoscopy  03/24/2024   HEMOGLOBIN A1C  04/29/2024   Diabetic kidney evaluation - Urine ACR  05/29/2024   OPHTHALMOLOGY EXAM  07/26/2024   Diabetic kidney evaluation - eGFR measurement  10/27/2024   FOOT EXAM  10/27/2024   DTaP/Tdap/Td (3 - Td or Tdap) 01/26/2032   Pneumonia Vaccine 2+ Years old  Completed   DEXA SCAN  Completed   Hepatitis C Screening  Completed   Zoster Vaccines- Shingrix   Completed  HPV VACCINES  Aged Out    Meningococcal B Vaccine  Aged Out    Health Maintenance  Health Maintenance Due  Topic Date Due   Medicare Annual Wellness (AWV)  10/05/2023   Health Maintenance Items Addressed: All Health Maintenance items up to date. Pt will get future COVID vaccines at local pharmacy.  Additional Screening:  Vision Screening: Recommended annual ophthalmology exams for early detection of glaucoma and other disorders of the eye. Would you like a referral to an eye doctor? No    Dental Screening: Recommended annual dental exams for proper oral hygiene  Community Resource Referral / Chronic Care Management: CRR required this visit?  No   CCM required this visit?  No   Plan:    I have personally reviewed and noted the following in the patient's chart:   Medical and social history Use of alcohol, tobacco or illicit drugs  Current medications and supplements including opioid prescriptions. Patient is not currently taking opioid prescriptions. Functional ability and status Nutritional status Physical activity Advanced directives List of other physicians Hospitalizations, surgeries, and ER visits in previous 12 months Vitals Screenings to include cognitive, depression, and falls Referrals and appointments  In addition, I have reviewed and discussed with patient certain preventive protocols, quality metrics, and best practice recommendations. A written personalized care plan for preventive services as well as general preventive health recommendations were provided to patient.   Susa Engman, CMA   11/02/2023   After Visit Summary: (In Person-Printed) AVS printed and given to the patient  Notes: Nothing significant to report at this time.

## 2023-11-02 NOTE — Patient Instructions (Addendum)
 Ms. Ackerley , Thank you for taking time out of your busy schedule to complete your Annual Wellness Visit with me. I enjoyed our conversation and look forward to speaking with you again next year. I, as well as your care team,  appreciate your ongoing commitment to your health goals. Please review the following plan we discussed and let me know if I can assist you in the future. Your Game plan/ To Do List    Follow up Visits: Next Medicare AWV with our clinical staff: 11/02/24 11am   Next Office Visit with your provider: 02/28/24 10:40am  Please get future COVID boosters at your local pharmacy.  Clinician Recommendations:  Aim for 30 minutes of exercise or brisk walking, 6-8 glasses of water, and 5 servings of fruits and vegetables each day.       This is a list of the screening recommended for you and due dates:  Health Maintenance  Topic Date Due   Medicare Annual Wellness Visit  10/05/2023   COVID-19 Vaccine (7 - 2024-25 season) 10/26/2024*   Flu Shot  12/30/2023   Colon Cancer Screening  03/24/2024   Hemoglobin A1C  04/29/2024   Yearly kidney health urinalysis for diabetes  05/29/2024   Eye exam for diabetics  07/26/2024   Yearly kidney function blood test for diabetes  10/27/2024   Complete foot exam   10/27/2024   DTaP/Tdap/Td vaccine (3 - Td or Tdap) 01/26/2032   Pneumonia Vaccine  Completed   DEXA scan (bone density measurement)  Completed   Hepatitis C Screening  Completed   Zoster (Shingles) Vaccine  Completed   HPV Vaccine  Aged Out   Meningitis B Vaccine  Aged Out  *Topic was postponed. The date shown is not the original due date.    Advanced directives: (Copy Requested) Please bring a copy of your health care power of attorney and living will to the office to be added to your chart at your convenience. You can mail to [facility] [address] [place], [place] [address] or email to [email or web address] Advance Care Planning is important because  it:  [x]  Makes sure you receive the medical care that is consistent with your values, goals, and preferences  [x]  It provides guidance to your family and loved ones and reduces their decisional burden about whether or not they are making the right decisions based on your wishes.  Follow the link provided in your after visit summary or read over the paperwork we have mailed to you to help you started getting your Advance Directives in place. If you need assistance in completing these, please reach out to us  so that we can help you!  See attachments for Preventive Care and Fall Prevention Tips.

## 2023-11-16 ENCOUNTER — Telehealth (HOSPITAL_BASED_OUTPATIENT_CLINIC_OR_DEPARTMENT_OTHER): Payer: Self-pay

## 2023-11-21 ENCOUNTER — Other Ambulatory Visit (HOSPITAL_BASED_OUTPATIENT_CLINIC_OR_DEPARTMENT_OTHER)

## 2023-11-25 ENCOUNTER — Other Ambulatory Visit: Payer: Self-pay | Admitting: Internal Medicine

## 2023-12-07 ENCOUNTER — Ambulatory Visit (HOSPITAL_BASED_OUTPATIENT_CLINIC_OR_DEPARTMENT_OTHER)
Admission: RE | Admit: 2023-12-07 | Discharge: 2023-12-07 | Disposition: A | Discharge status: Home / Self Care | Source: Ambulatory Visit | Attending: Obstetrics & Gynecology | Admitting: Obstetrics & Gynecology

## 2023-12-07 DIAGNOSIS — Z9189 Other specified personal risk factors, not elsewhere classified: Secondary | ICD-10-CM | POA: Diagnosis not present

## 2023-12-07 DIAGNOSIS — M8589 Other specified disorders of bone density and structure, multiple sites: Secondary | ICD-10-CM | POA: Diagnosis not present

## 2023-12-07 DIAGNOSIS — Z780 Asymptomatic menopausal state: Secondary | ICD-10-CM | POA: Diagnosis not present

## 2023-12-07 DIAGNOSIS — Z13820 Encounter for screening for osteoporosis: Secondary | ICD-10-CM | POA: Diagnosis not present

## 2023-12-21 ENCOUNTER — Other Ambulatory Visit: Payer: Self-pay | Admitting: Internal Medicine

## 2024-01-09 ENCOUNTER — Other Ambulatory Visit: Payer: Self-pay | Admitting: Internal Medicine

## 2024-01-14 ENCOUNTER — Other Ambulatory Visit: Payer: Self-pay | Admitting: Internal Medicine

## 2024-02-14 ENCOUNTER — Encounter: Payer: Self-pay | Admitting: Gastroenterology

## 2024-02-18 ENCOUNTER — Other Ambulatory Visit: Payer: Self-pay | Admitting: Internal Medicine

## 2024-02-28 ENCOUNTER — Encounter: Payer: Self-pay | Admitting: Internal Medicine

## 2024-02-28 ENCOUNTER — Ambulatory Visit: Admitting: Internal Medicine

## 2024-02-28 VITALS — BP 154/92 | HR 87 | Temp 98.0°F | Resp 20 | Ht 65.0 in | Wt 225.0 lb

## 2024-02-28 DIAGNOSIS — I10 Essential (primary) hypertension: Secondary | ICD-10-CM | POA: Diagnosis not present

## 2024-02-28 DIAGNOSIS — Z7984 Long term (current) use of oral hypoglycemic drugs: Secondary | ICD-10-CM

## 2024-02-28 DIAGNOSIS — E1169 Type 2 diabetes mellitus with other specified complication: Secondary | ICD-10-CM | POA: Diagnosis not present

## 2024-02-28 DIAGNOSIS — H8109 Meniere's disease, unspecified ear: Secondary | ICD-10-CM | POA: Diagnosis not present

## 2024-02-28 DIAGNOSIS — G4733 Obstructive sleep apnea (adult) (pediatric): Secondary | ICD-10-CM | POA: Diagnosis not present

## 2024-02-28 LAB — HEMOGLOBIN A1C: Hgb A1c MFr Bld: 7.1 % — ABNORMAL HIGH (ref 4.6–6.5)

## 2024-02-28 LAB — MICROALBUMIN / CREATININE URINE RATIO
Creatinine,U: 135.1 mg/dL
Microalb Creat Ratio: 7.3 mg/g (ref 0.0–30.0)
Microalb, Ur: 1 mg/dL (ref 0.0–1.9)

## 2024-02-28 LAB — BASIC METABOLIC PANEL WITH GFR
BUN: 16 mg/dL (ref 6–23)
CO2: 24 meq/L (ref 19–32)
Calcium: 10.4 mg/dL (ref 8.4–10.5)
Chloride: 102 meq/L (ref 96–112)
Creatinine, Ser: 0.88 mg/dL (ref 0.40–1.20)
GFR: 62.57 mL/min (ref >60.00)
Glucose, Bld: 109 mg/dL — ABNORMAL HIGH (ref 70–99)
Potassium: 4.4 meq/L (ref 3.5–5.1)
Sodium: 140 meq/L (ref 135–145)

## 2024-02-28 MED ORDER — OZEMPIC (0.25 OR 0.5 MG/DOSE) 2 MG/3ML ~~LOC~~ SOPN: PEN_INJECTOR | SUBCUTANEOUS | 0 refills | Status: DC

## 2024-02-28 NOTE — Progress Notes (Unsigned)
 Subjective:    Patient ID: Kristy [name], female    DOB: [birth year], 78 y.o.   MRN: [id number]  DOS:  02/28/2024 Type of visit - description: Follow-up  Discussed the use of AI scribe software for clinical note transcription with the patient, who gave verbal consent to proceed.  History of Present Illness Kristy [name] is a 78 year old female with Meniere's disease and vertigo who presents with frequent falls.  Gait instability and falls - Frequent falls and constant unsteadiness - Requires caution when stopping and turning - Uses a cane and a rollator with a seat for outings - Unable to stand steadily for long when eyes are closed - No head or neck injuries in the past 6-12 months - Sustained a back injury almost a year ago - Balance therapy attempted twice without improvement  Vertigo and meniere's disease - History of Meniere's disease with persistent vertigo contributing to balance difficulties  Diabetes mellitus - Monitors blood glucose daily - Blood glucose was 149 mg/dL this morning - Takes metformin , Jardiance , and pioglitazone  - Experiences constant hunger, suspected to be related to diabetes medication  Asthma - Asthma managed with medication  Hypertension - Takes losartan , metoprolol , and CardiaSim for blood pressure control - Blood pressure typically in the 120s/70s-80s range  Hyperlipidemia - Takes Lipitor 10 mg for cholesterol management  Gout - Takes Uloric  for gout - No recent gout flare-ups    Review of Systems See above   Past Medical History:  Diagnosis Date   Allergy    Environmental, foods: shellfish, lactose intolerant, citrus, peanuts   Asthma    mild persistent   Atrophic vaginitis    BRCA gene mutation negative 2015   testing done at [facility] long   Cataract 2016   had corrective cataract surgery both eyes   Diabetes mellitus without complication (HCC)    Type 2   DJD (degenerative joint disease)    generalized joint pain,  including back   Dysrhythmia    SVT   Family history of malignant neoplasm of breast    Fibromyalgia    Dr Mai   GERD (gastroesophageal reflux disease)    Gout    Idiopathic, chronic   Hiatal hernia    History of kidney stones    Hyperlipidemia    Hypertension    Meniere's disease    R side worse, L side starting, see's Dr. Karis.   Palpitations    PSVT on Holter monitoring   Shortness of breath    occ   Stroke [facility])    TIA  2014   SUI (stress urinary incontinence, female)    (resolved after surgery)   TIA (transient ischemic attack)    07/2012, see Dr Rosemarie   Vertigo     Past Surgical History:  Procedure Laterality Date   ABDOMINAL HYSTERECTOMY  1994   TAH,BSO-BLADDER NECK SUSPENSION   BREAST SURGERY     LUMPECTOMY-BENIGN Breast Bx X2   CARPAL TUNNEL RELEASE Right 2005   CATARACT EXTRACTION, BILATERAL Bilateral 2016   Cataract    CHOLECYSTECTOMY     CHOLECYSTECTOMY, LAPAROSCOPIC  2001   CYSTOCELE REPAIR  1994   EYE SURGERY     cataracts   JOINT REPLACEMENT     bilatral knees;    KNEE ARTHROPLASTY  05/19/2012   Procedure: COMPUTER ASSISTED TOTAL KNEE ARTHROPLASTY;  Surgeon: Oneil JAYSON Herald, MD;  Location: [facility] OR;  Service: Orthopedics;  Laterality: Left;  Left  Total Knee Arthroplasty  KNEE ARTHROPLASTY Right 09/03/2013   Procedure: COMPUTER ASSISTED TOTAL KNEE ARTHROPLASTY;  Surgeon: Oneil JAYSON Herald, MD;  Location: [facility] OR;  Service: Orthopedics;  Laterality: Right;  Right Total Knee Arthroplasty, Computer Assist   KNEE ARTHROSCOPY     bil   LUMBAR LAMINECTOMY/DECOMPRESSION MICRODISCECTOMY N/A 08/18/2020   Procedure: L4-5 decompression, possible left L4-5 microdiscectomy;  Surgeon: Herald Oneil JAYSON, MD;  Location: [facility] OR;  Service: Orthopedics;  Laterality: N/A;   OOPHORECTOMY     BSO   SPINE SURGERY  07/2020   SURGERY FOR POSERIOR TIBIAL TENDON  2003   TONSILLECTOMY     TRIPLE FUSION LEFT ANKLE Left 2007   TUBAL LIGATION      Current Outpatient Medications   Medication Instructions   aspirin  EC 81 mg, Daily   atorvastatin  (LIPITOR) 10 mg, Oral, Daily at bedtime   Calcium  Carbonate (CALCIUM  600 PO) 600 mg, 2 times daily   Cholecalciferol  2,000 Units, Daily at bedtime   diltiazem  (CARDIZEM  CD) 240 mg, Oral, Daily   empagliflozin  (JARDIANCE ) 10 mg, Oral, Daily before breakfast   EPINEPHrine  (EPI-PEN) 0.3 mg, See admin instructions   febuxostat  (ULORIC ) 40 mg, Every evening   fluticasone  (FLONASE ) 50 MCG/ACT nasal spray 2 sprays, Every morning   fluticasone  (FLOVENT  HFA) 110 MCG/ACT inhaler 2 puffs, Daily   ipratropium (ATROVENT) 0.03 % nasal spray 3 times daily PRN   levalbuterol  (XOPENEX  HFA) 45 MCG/ACT inhaler 2 puffs, As needed   levocetirizine (XYZAL ) 5 mg, Daily-1600   losartan  (COZAAR ) 25 mg, Oral, Daily   metFORMIN  (GLUCOPHAGE ) 1,275 mg, Oral, 2 times daily with meals   metoprolol  succinate (TOPROL  XL) 37.5 mg, Oral, Daily   nortriptyline  (PAMELOR ) 25 mg, Daily-1600   omeprazole  (PRILOSEC) 40 mg, Oral, Daily   pioglitazone  (ACTOS ) 45 mg, Oral, Daily   vitamin E  400 Units, Daily-1600       Objective:   Physical Exam BP (!) 146/80   Pulse 87   Temp 98 F (36.7 C) (Oral)   Resp 20   Ht 5' 5 (1.651 m)   Wt 225 lb (102.1 kg)   SpO2 97%   BMI 37.44 kg/m      Assessment     Assessment DM w/ h/o TIA Neuropathy Dx 09/2023 HTN  Hyperlipidemia  Insomnia  Asthma , allergies-- sees Dr Fleeta winkle  GERD, hiatal hernia , h/o IBS Obesity  CV: ---Palpitations: PSVT by Holter, Dr Court ---TIA, dx 2014,last visit w/  Dr. Rosemarie 09-2015, RTC prn ENT: Dr Donnajean -Vertigo (improved with vestibular rehabilitation 2015) -HOH- aids, brain  MRI 03/2019.   -Menier's (Dx   2020) MSK: *Osteopenia: dexa per Gyn *Fibromyalgia, DJD, Gout : per Dr. Mai, on Pamelor   *Hyperparathyroidism h/o slt increased PTH 2015 , normal PTH 05-2014  *Gout- Dr Mai  GU:  --Kidney stones, h/o; Urinary incontinence; Atrophic vaginitis OSA dx 08-2021,  started CPAP 10/2021 FH breast cancer , sister is BRCA2 (+), pt is  (-) Increased LFTs --- CT abdomen 2014: NormalLiver, negative hepatitis serologies at rheumatology per patient   Assessment & Plan Vertigo with frequent falls Chronic vertigo resulting in frequent falls. She declines physical therapy due to previous ineffectiveness and prefers conservative management over surgical intervention due to age. - Encourage use of walker to prevent falls - Follow-up with ENT tomorrow  Type 2 diabetes mellitus Blood glucose levels monitored daily, with recent reading of 149 mg/dL. Discussed starting Ozempic for DM management, weight loss and improved glucose control. Concerns about hunger possibly related to  pioglitazone . Discussed potential side effects of Ozempic, including nausea and constipation, and cost implications if not covered by insurance. - Prescribe Ozempic and send prescription to [facility].  To start low-dose, she will call after 4 weeks to see if she can tolerate higher doses - Discontinue pioglitazone  upon starting Ozempic - Continue metformin  and Jardiance  - Monitor blood glucose levels -Check A1c Obesity BMI of 37.  Once you start Ozempic should be able to lose some weight.   Highlighted the importance of combining medication with healthy eating habits. Hypertension Blood pressure readings are consistently normal, around 120s/70s-80s when checked at home. Current medication regimen includes losartan , metoprolol , and diltiazem . BP today is slightly elevated but for now we will continue current antihypertensive medications OSA: On CPAP Hyperlipidemia Cholesterol levels are well controlled with atorvastatin .  No change General Health Maintenance Plans [facility]. Discussed the importance of COVID vaccination, especially given age-related risks.  RTC 3 months    ================  5/30 DM: Currently on pioglitazone , metformin , Jardiance , checking  A1c. Ambulatory CBGs 120s.  140s. Reports increased appetite, switch Actos  to a GLP-1's could be an option, for now she would like to think about it. Neuropathy: New issue, reports nocturnal feet numbness, for now symptoms controlled by a special pair of socks she got. Frequent falls: The patient thinks is a combination of Mnire's and MSK issue.  She also has neuropathy.  Offered PT: Declined it.  Precautions discussed. HTN: Reports ambulatory BPs in the 120/70.  Continue Cardizem , losartan , metoprolol , check BMP and CBC. Hyperlipidemia: On atorvastatin , check FLP. Gout: On Uloric , sees rheumatology, no symptoms, request uric acid levels.  Will do Preventive care reviewed: -Td 2023 -PNM 23: 2011, 2022; PNM 13: 2015 - S/p Shingrix , had a RSV 2024 per pt  - had a covid vax 2024,fall - Rec , flu shot every fall  Female care: Saw gynecology 09/2023 +FH  of breast cancer (pt is BRCA negative) [facility]G 06/2023 ([facility]) - [facility]CS: s/p colonoscopy in 2012, colonoscopy 11-2015, cscope 02-2021, next 3 years per GI letter.  Patient aware. RTC 4 months

## 2024-02-28 NOTE — Patient Instructions (Signed)
 GO TO THE LAB :  Get the blood work    Then, go to the front desk for the checkout Please make an appointment for a checkup in 3 months   STOP BY THE FIRST FLOOR:  get the XR   Sending a prescription for Ozempic 2.5 mg 1 injection a week. If you start Ozempic, okay to stop Actos  (pioglitazone ) After the initial 4 doses call me and let me know how you are doing   Your blood pressure is high today however it is good at home.  Continue checking blood pressure regularly Blood pressure goal:  between 110/65 and  135/85. If it is consistently higher or lower, let me know  Continue checking your blood sugars: You can check your sugars at different times  - early in AM fasting  ( blood sugar goal 70-130) - 2 hours after a meal (blood sugar goal less than 180)    Please read more detailed instructions below    YOUR PLAN: VERTIGO WITH FREQUENT FALLS: You have chronic vertigo which is causing frequent falls. You prefer conservative management over surgical intervention. -Continue using your walker to prevent falls. -Follow up with ENT tomorrow.  TYPE 2 DIABETES MELLITUS: Your blood glucose levels are being monitored daily. We discussed starting Ozempic to help with weight loss and better glucose control. -Start taking Ozempic. The prescription has been sent to [facility]. -Stop taking pioglitazone  once you start Ozempic. -Continue taking metformin  and Jardiance . -Keep monitoring your blood glucose levels.    HYPERTENSION: Your blood pressure readings are consistently normal with your current medications. -Continue taking losartan , metoprolol , and diltiazem .  HYPERLIPIDEMIA: Your cholesterol levels are well controlled with atorvastatin . -Continue taking atorvastatin .  GENERAL HEALTH MAINTENANCE: We discussed the importance of flu and COVID vaccinations, especially given your age-related risks. -[facility] -[facility].

## 2024-02-29 ENCOUNTER — Encounter (INDEPENDENT_AMBULATORY_CARE_PROVIDER_SITE_OTHER): Payer: Self-pay | Admitting: Otolaryngology

## 2024-02-29 ENCOUNTER — Ambulatory Visit (INDEPENDENT_AMBULATORY_CARE_PROVIDER_SITE_OTHER): Admitting: Otolaryngology

## 2024-02-29 VITALS — BP 115/74 | HR 72 | Temp 97.7°F

## 2024-02-29 DIAGNOSIS — G4733 Obstructive sleep apnea (adult) (pediatric): Secondary | ICD-10-CM | POA: Insufficient documentation

## 2024-02-29 DIAGNOSIS — R42 Dizziness and giddiness: Secondary | ICD-10-CM | POA: Diagnosis not present

## 2024-02-29 DIAGNOSIS — H903 Sensorineural hearing loss, bilateral: Secondary | ICD-10-CM

## 2024-02-29 NOTE — Progress Notes (Unsigned)
 Patient ID: Kristy [name], female   DOB: 1945/08/31, 78 y.o.   MRN: [id number]  Follow up: Recurrent dizziness, Meniere's disease, hearing loss  HPI: The patient is a 78 year old female who returns today for evaluation of her recurrent dizziness.  She has a history of right ear Mnire's disease.  She was treated with a 1500 mg low-salt diet.  She also has a history of bilateral sensorineural hearing loss, worse on the right side.  Her previous MRI scan was negative.  The patient returns today reporting more recurrent dizziness over the past 6 months.  She has had multiple falls due to her off-balance sensation.  She continues to have hearing difficulty, worse on the right side.  She is following the low-salt diet.  She was taken off her diuretic due to her low blood pressure.  Currently she denies any otalgia or otorrhea.  Exam: General: Communicates without difficulty, well nourished, no acute distress. Head: Normocephalic, no evidence injury, no tenderness, facial buttresses intact without stepoff. Face/sinus: No tenderness to palpation and percussion. Facial movement is normal and symmetric. Eyes: PERRL, EOMI. No scleral icterus, conjunctivae clear. Neuro: CN II exam reveals vision grossly intact.  No nystagmus at any point of gaze. Ears: Auricles well formed without lesions.  Ear canals are intact without mass or lesion.  No erythema or edema is appreciated.  The TMs are intact without fluid. Nose: External evaluation reveals normal support and skin without lesions.  Dorsum is intact.  Anterior rhinoscopy reveals pink mucosa over anterior aspect of inferior turbinates and intact septum.  No purulence noted. Oral:  Oral cavity and oropharynx are intact, symmetric, without erythema or edema.  Mucosa is moist without lesions. Neck: Full range of motion without pain.  There is no significant lymphadenopathy.  No masses palpable.  Thyroid  bed within normal limits to palpation.  Parotid glands and submandibular  glands equal bilaterally without mass.  Trachea is midline. Neuro:  CN 2-12 grossly intact. Gait wide-based. Vestibular: No nystagmus at any point of gaze. The cerebellar examination is unremarkable. Vestibular: There is no nystagmus with pneumatic pressure on either tympanic membrane or Valsalva.   Assessment: 1.  Recurrent dizziness, likely secondary to exacerbations of her right ear Mnire's disease. 2.  Bilateral sensorineural hearing loss, worse on the right side.  Her MRI scan was negative. 3.  The patient's ear canals, tympanic membranes, and middle ear spaces are all normal.   Plan: 1.  The physical exam findings are reviewed with the patient.   2.  Continue with 1500 mg low-salt diet.  3.  Continue balance exercise daily. 4.  The pathophysiology of Mnire's disease and dizziness are again discussed with the patient.  Questions are invited and answered. 5.  The option of endolymphatic sac decompression surgery to treat her recurrent vertigo is also discussed.  The risk, benefits, and details of the procedure are reviewed. 6.  The patient would like to consider her options.  The patient will return for reevaluation in 6 months.

## 2024-02-29 NOTE — Assessment & Plan Note (Addendum)
 Vertigo with frequent falls Chronic vertigo w/ frequent falls. She declines physical therapy due to previous ineffectiveness and prefers conservative management over surgical intervention due to age. - Encourage use of walker to prevent falls - Follow-up with ENT tomorrow Type 2 diabetes mellitus Blood glucose levels monitored daily, with recent reading of 149 mg/dL. Discussed starting Ozempic for DM management, weight loss and improved glucose control.  Also discussed potential side effects of Ozempic, including nausea and constipation, and cost implications if not covered by insurance. Plan: - Start Ozempic, Rx sent.  To start low-dose, she will call after 4 weeks to see if she can tolerate higher doses - Discontinue pioglitazone  upon starting Ozempic - Continue metformin  and Jardiance  - Monitor blood glucose levels -Check A1c Morbid obesity BMI of 37.  To start Ozempic for DM management.  Highlighted the importance of combining medication with healthy eating habits. Hypertension Blood pressure readings are consistently normal, around 120s/70s-80s when checked at home. Current medication regimen includes losartan , metoprolol , and diltiazem . BP today is slightly elevated but for now we will continue current antihypertensive medications OSA: On CPAP Hyperlipidemia Cholesterol levels are well controlled with atorvastatin .  No change General Health Maintenance Plans [facility]  RTC 3 months

## 2024-03-01 ENCOUNTER — Ambulatory Visit: Payer: Self-pay | Admitting: Internal Medicine

## 2024-03-14 ENCOUNTER — Other Ambulatory Visit: Payer: Self-pay | Admitting: Internal Medicine

## 2024-03-25 ENCOUNTER — Other Ambulatory Visit: Payer: Self-pay | Admitting: Adult Health

## 2024-03-27 ENCOUNTER — Encounter: Payer: Self-pay | Admitting: Internal Medicine

## 2024-03-27 ENCOUNTER — Telehealth: Payer: Self-pay | Admitting: Cardiology

## 2024-03-27 MED ORDER — METOPROLOL SUCCINATE ER 25 MG PO TB24: 37.5000 mg | ORAL_TABLET | Freq: Every day | ORAL | 0 refills | Status: DC

## 2024-03-27 NOTE — Telephone Encounter (Signed)
°*  STAT* If patient is at the pharmacy, call can be transferred to refill team.   1. Which medications need to be refilled? (please list name of each medication and dose if known)  metoprolol succinate (TOPROL-XL) 25 MG 24 hr tablet  2. Which pharmacy/location (including street and city if local pharmacy) is medication to be sent to? [id number] [place], [place] [facility] [address].  3. Do they need a 30 day or 90 day supply?  90 day supply

## 2024-03-27 NOTE — Telephone Encounter (Signed)
 90 day refill [facility].

## 2024-03-28 MED ORDER — OZEMPIC (0.25 OR 0.5 MG/DOSE) 2 MG/3ML ~~LOC~~ SOPN: 0.2500 mg | PEN_INJECTOR | SUBCUTANEOUS | 0 refills | Status: DC

## 2024-04-16 DIAGNOSIS — M797 Fibromyalgia: Secondary | ICD-10-CM | POA: Diagnosis not present

## 2024-04-16 DIAGNOSIS — M65321 Trigger finger, right index finger: Secondary | ICD-10-CM | POA: Diagnosis not present

## 2024-04-16 DIAGNOSIS — M1A09X0 Idiopathic chronic gout, multiple sites, without tophus (tophi): Secondary | ICD-10-CM | POA: Diagnosis not present

## 2024-04-16 DIAGNOSIS — Z6836 Body mass index (BMI) 36.0-36.9, adult: Secondary | ICD-10-CM | POA: Diagnosis not present

## 2024-04-16 DIAGNOSIS — M1991 Primary osteoarthritis, unspecified site: Secondary | ICD-10-CM | POA: Diagnosis not present

## 2024-04-16 DIAGNOSIS — E669 Obesity, unspecified: Secondary | ICD-10-CM | POA: Diagnosis not present

## 2024-04-16 DIAGNOSIS — R945 Abnormal results of liver function studies: Secondary | ICD-10-CM | POA: Diagnosis not present

## 2024-04-16 NOTE — Progress Notes (Unsigned)
 PATIENT: Kristy [name] DOB: 21-Feb-1946  REASON FOR VISIT: follow up HISTORY FROM: patient  No chief complaint on file.    HISTORY OF PRESENT ILLNESS:  04/16/24 ALL:  Kristy [name] returns for follow up for OSA on CPAP. Se is doing well on therapy. She uses CPAP nightly for about     04/13/2023 ALL:  Kristy [name] is a 78 y.o. female here today for follow up for OSA on CPAP. She continues to do well on therapy. She endorses more GERD symptoms which has limited her use over the past few weeks. She occasionally notes an air leak. She is due for replacement mask. Using F30 FFM. She denies concerns with machine. She sleeps fairly well, most nights. She is primary caregiver for her husband with end stage liver disease and her son with leukemia.     HISTORY: (copied from Dr Obie previous note)  Kristy [name] is a 78 year old right-handed woman with an underlying medical history of TIA, stroke, dizziness (seen by Dr. Rosemarie in the past), carotid artery stenosis, asthma, allergies, diabetes, degenerative joint disease, fibromyalgia, vertigo, reflux disease, gout, history of kidney stones, hypertension, hyperlipidemia, palpitations with history of PSVT, and obesity, who presents for follow-up consultation of her obstructive sleep apnea, after her interim CPAP titration study. The patient is unaccompanied today.  I last saw her on 12/16/21, at which time she was compliant with her AutoPap.  She had adjusted well to treatment.  We proceeded with an overnight pulse oximetry test.   She had an overnight pulse oximetry test on 12/21/2021 through 12/22/2021.  Patient was on her PAP machine and duration of test was 7 hours and 19 minutes.  Average oxygen  saturation was 91.8%, nadir was 81%, time below or at 88% saturation was 16 minutes and 49 seconds.  She had just a handful of desaturations below 85%, several desaturations into the higher in mid 80s.  I recommended that patient come in for a proper CPAP titration  study.    She had a CPAP titration study on 01/11/2022.  She did well with CPAP of 16 cm.  We proceeded to change her from AutoPap to CPAP at home.  She was originally placed on home CPAP of 16 cm but in September 2023 we reduce the pressure from 16 to 14 cm as the pressure was too high for her to tolerate.    Today, 04/21/2022: I reviewed her CPAP compliance data from 03/21/2022 through 04/19/2022, which is a total of 30 days, during which time she used her machine every night with the exception of 1.  Percent use days greater than 4 hours was 97%, indicating excellent compliance with an average usage of 6 hours and 54 minutes, residual AHI at goal at 3.1/h, pressure of 14 cm with EPR of 3.  Leak acceptable with the 95th percentile at 15.7 mg/min.  She reports doing well with her CPAP, she is adjusted to treatment, started off with a F20 fullface mask but broke out in a rash, and also pinched at the nasal bridge, the medium was too big and she switched to a small.  Subsequently she switched to a F30 fullface mask from [employer] which is under the nose and mouth but still had issues with a rash, she may be allergic to silicone as she also sometimes breaks out in a rash when she wears her Apple Watch.  She is very motivated to continue with treatment, she had to skip at night because her husband had  to be hospitalized at night.  She is under stress, he was in the ICU and is still in the hospital, he may be able to be discharged today, she is going to go to the hospital straight from here.  She also worries about her son who has complications of diabetes.    The patient's allergies, current medications, family history, past medical history, past social history, past surgical history and problem list were reviewed and updated as appropriate.    Previously (copied from previous notes for reference):    I first met her at the request of her primary care physician on 09/15/2021, at which time she reported snoring and  excessive daytime somnolence.  She was advised to proceed with a sleep study.  She had a baseline sleep study on 09/21/2021 which showed a sleep efficiency of 75.3%, sleep latency delayed at 41.5 minutes and REM latency delayed at 169 minutes.  She had mild to moderate sleep fragmentation with wake after sleep onset at 76.5 minutes.  She had a decreased percentage of REM sleep at 9.1%.  Total AHI based on hypopneas was 11.5/h, REM AHI 68/h, supine AHI 16.5/h.  Average oxygen  saturation was 94%, nadir was 84%.  Time below 89% saturation was 62 minutes for the night.  She did get started on supplemental oxygen  at 1 L/min as she had lower average oxygen  saturations around 87% during sleep.  Her sleep related average oxygen  saturations improved to 90% after supplemental oxygen .  Based on her test results she was advised to start AutoPap therapy.  Her set up date was 11/03/2021.  She has a [employer] air sense 11 AutoSet machine.   I reviewed her AutoPap compliance data from 11/15/2021 through 12/14/2021, which is a total of 30 days, during which time she used her machine 29 days with percent use days greater than 4 hours at 97%, indicating excellent compliance with an average usage of 7 hours and 22 minutes, residual AHI borderline at 4.8/h, average pressure for the 95th percentile at 9.9 cm with a range of 5 to 11 cm, leak acceptable with the 95th percentile at 9.2 L/min.     09/15/21: (She) reports snoring and excessive daytime somnolence. I reviewed your office note from 07/15/2021. Her Epworth sleepiness score is 20 out of 24, fatigue severity score is 63 out of 63.  Symptoms have become worse over time.  She has had intermittent vertigo and has seen Dr. Karis.  She goes to bed generally around 10:30 AM and rise time is around 9 AM.  Some nights she has a hard time sleeping, and some nights she sleeps a lot.  She has nocturia about twice per average 9 and has had occasional morning headaches in the front.  She reports a  family history of sleep apnea, her son has a CPAP machine.  She sleeps on a wedge, it helps her reflux symptoms in her breathing.  She wakes up with dry mouth.  She has allergy symptoms and asthma.  She does not drink caffeine daily.  She does not drink alcohol, she is a non-smoker.   REVIEW OF SYSTEMS: Out of a complete 14 system review of symptoms, the patient complains only of the following symptoms, none and all other reviewed systems are negative.   ALLERGIES: Allergies  Allergen Reactions   Iodine Anaphylaxis   Shellfish Allergy Anaphylaxis   Sulfonamide Derivatives Rash   Cephalexin Other (See Comments)   Citrus Hives, Itching and Other (See Comments)    Itching  in mouth/ blisters on lips   Lactose Intolerance (Gi) Diarrhea and Swelling    Swollen mouth   Amoxicillin Diarrhea    Did it involve swelling of the face/tongue/throat, SOB, or low BP? No Did it involve sudden or severe rash/hives, skin peeling, or any reaction on the inside of your mouth or nose? No Did you need to seek medical attention at a hospital or doctor's office? No When did it last happen?     80-33 years old  If all above answers are "NO", may proceed with cephalosporin use.    HOME MEDICATIONS: Outpatient Medications Prior to Visit  Medication Sig Dispense Refill   aspirin  EC 81 MG tablet Take 81 mg by mouth daily.     atorvastatin  (LIPITOR) 10 MG tablet Take 1 tablet (10 mg total) by mouth at bedtime. 90 tablet 1   Calcium  Carbonate (CALCIUM  600 PO) Take 600 mg by mouth in the morning and at bedtime. (1600 & bedtime)     Cholecalciferol  50 MCG (2000 UT) CAPS Take 2,000 Units by mouth at bedtime.      diltiazem  (CARDIZEM  CD) 240 MG 24 hr capsule Take 1 capsule (240 mg total) by mouth daily. 90 capsule 1   empagliflozin  (JARDIANCE ) 10 MG TABS tablet Take 1 tablet (10 mg total) by mouth daily before breakfast. 90 tablet 1   EPINEPHrine  (EPI-PEN) 0.3 mg/0.3 mL DEVI Inject 0.3 mg into the muscle See admin  instructions. Inject 0.3 mg intramuscularly in the leg as needed for severe allergic reaction, call 911 and then inject again in the other leg. Reported on 06/26/2015     febuxostat  (ULORIC ) 40 MG tablet Take 40 mg by mouth every evening.      fluticasone  (FLONASE ) 50 MCG/ACT nasal spray Place 2 sprays into both nostrils in the morning.     fluticasone  (FLOVENT  HFA) 110 MCG/ACT inhaler Inhale 2 puffs into the lungs daily.     ipratropium (ATROVENT) 0.03 % nasal spray Place into the nose 3 (three) times daily as needed.     levalbuterol  (XOPENEX  HFA) 45 MCG/ACT inhaler Inhale 2 puffs into the lungs as needed for shortness of breath.     levocetirizine (XYZAL ) 5 MG tablet Take 5 mg by mouth daily at 4 PM.     losartan  (COZAAR ) 25 MG tablet Take 1 tablet (25 mg total) by mouth daily. 90 tablet 1   metFORMIN  (GLUCOPHAGE ) 850 MG tablet Take 1.5 tablets (1,275 mg total) by mouth 2 (two) times daily with a meal. 270 tablet 1   metoprolol  succinate (TOPROL  XL) 25 MG 24 hr tablet Take 1.5 tablets (37.5 mg total) by mouth daily. 125 tablet 0   nortriptyline  (PAMELOR ) 25 MG capsule Take 25 mg by mouth daily at 4 PM.     omeprazole  (PRILOSEC) 40 MG capsule Take 1 capsule (40 mg total) by mouth daily. 90 capsule 1   pioglitazone  (ACTOS ) 45 MG tablet Take 1 tablet (45 mg total) by mouth daily. 90 tablet 1   Semaglutide,0.25 or 0.5MG /DOS, (OZEMPIC, 0.25 OR 0.5 MG/DOSE,) 2 MG/3ML SOPN Inject 0.25 mg into the skin once a week. 9 mL 0   vitamin E  400 UNIT capsule Take 400 Units by mouth daily at 4 PM.     No facility-administered medications prior to visit.    PAST MEDICAL HISTORY: Past Medical History:  Diagnosis Date   Allergy    Environmental, foods: shellfish, lactose intolerant, citrus, peanuts   Asthma    mild persistent   Atrophic  vaginitis    BRCA gene mutation negative 2015   testing done at [facility] long   Cataract 2016   had corrective cataract surgery both eyes   Diabetes mellitus without  complication (HCC)    Type 2   DJD (degenerative joint disease)    generalized joint pain, including back   Dysrhythmia    SVT   Family history of malignant neoplasm of breast    Fibromyalgia    Dr Mai   GERD (gastroesophageal reflux disease)    Gout    Idiopathic, chronic   Hiatal hernia    History of kidney stones    Hyperlipidemia    Hypertension    Meniere's disease    R side worse, L side starting, see's Dr. Karis.   Palpitations    PSVT on Holter monitoring   Shortness of breath    occ   Stroke [facility])    TIA  2014   SUI (stress urinary incontinence, female)    (resolved after surgery)   TIA (transient ischemic attack)    07/2012, see Dr Rosemarie   Vertigo     PAST SURGICAL HISTORY: Past Surgical History:  Procedure Laterality Date   ABDOMINAL HYSTERECTOMY  1994   TAH,BSO-BLADDER NECK SUSPENSION   BREAST SURGERY     LUMPECTOMY-BENIGN Breast Bx X2   CARPAL TUNNEL RELEASE Right 2005   CATARACT EXTRACTION, BILATERAL Bilateral 2016   Cataract    CHOLECYSTECTOMY     CHOLECYSTECTOMY, LAPAROSCOPIC  2001   CYSTOCELE REPAIR  1994   EYE SURGERY     cataracts   JOINT REPLACEMENT     bilatral knees;    KNEE ARTHROPLASTY  05/19/2012   Procedure: COMPUTER ASSISTED TOTAL KNEE ARTHROPLASTY;  Surgeon: Oneil JAYSON Herald, MD;  Location: [facility] OR;  Service: Orthopedics;  Laterality: Left;  Left  Total Knee Arthroplasty   KNEE ARTHROPLASTY Right 09/03/2013   Procedure: COMPUTER ASSISTED TOTAL KNEE ARTHROPLASTY;  Surgeon: Oneil JAYSON Herald, MD;  Location: [facility] OR;  Service: Orthopedics;  Laterality: Right;  Right Total Knee Arthroplasty, Computer Assist   KNEE ARTHROSCOPY     bil   LUMBAR LAMINECTOMY/DECOMPRESSION MICRODISCECTOMY N/A 08/18/2020   Procedure: L4-5 decompression, possible left L4-5 microdiscectomy;  Surgeon: Herald Oneil JAYSON, MD;  Location[facility] OR;  Service: Orthopedics;  Laterality: N/A;   OOPHORECTOMY     BSO   SPINE SURGERY  07/2020   SURGERY FOR POSERIOR TIBIAL TENDON  2003    TONSILLECTOMY     TRIPLE FUSION LEFT ANKLE Left 2007   TUBAL LIGATION      FAMILY HISTORY: Family History  Problem Relation Age of Onset   Breast cancer Mother 30       deceased 19   Arthritis Mother    Cancer Mother    Cancer Father        Brain tumor; path?; deceased 75   Breast cancer Sister 50       bilateral breast ca @ 86; BRCA2 positive   Hypertension Sister    Cancer Sister    Hemochromatosis Sister    Thalassemia Sister    Other Sister        negative for BRCA1 BRCA2   Birth defects Sister    Cancer Sister    Heart failure Sister        due to chemo?   Lymphoma Maternal Aunt        deceased 25s   Cancer Maternal Uncle    CAD Son  MI   Diabetes Son    Leukemia Son    Breast cancer Cousin        BRCA2 positive   Diabetes Other    Breast cancer Other        distant maternal female relatives with breast cancer   Diabetes Maternal Grandmother    Cancer Son    Diabetes Son    Colon cancer Neg Hx    Colon polyps Neg Hx    Esophageal cancer Neg Hx    Rectal cancer Neg Hx    Stomach cancer Neg Hx     SOCIAL HISTORY: Social History   Socioeconomic History   Marital status: Married    Spouse name: Christopher   Number of children: 2   Years of education: 16   Highest education level: Some college, no degree  Occupational History   Occupation: Location Manager-- retired     Associate Professor: RETIRED  Tobacco Use   Smoking status: Never   Smokeless tobacco: Never  Vaping Use   Vaping status: Never Used  Substance and Sexual Activity   Alcohol use: No   Drug use: No   Sexual activity: Not Currently    Birth control/protection: Surgical  Other Topics Concern   Not on file  Social History Narrative   HSG   1 year college for accounting. married '69. 2 sons - '70, '72: retired - [employer].     ACP - OK for CPR at least once, no long term ventilation, no heroic measures in the face of poor quality of life. HCPOA - husband, secondary son Kadey Mihalic (c) [phone]    No Cafffeine (or very rare).    Right Handed       Social Drivers of Health   Financial Resource Strain: Low Risk  (02/21/2024)   Overall Financial Resource Strain (CARDIA)    Difficulty of Paying Living Expenses: Not hard at all  Food Insecurity: No Food Insecurity (02/21/2024)   Hunger Vital Sign    Worried About Running Out of Food in the Last Year: Never true    Ran Out of Food in the Last Year: Never true  Transportation Needs: No Transportation Needs (02/21/2024)   PRAPARE - Administrator, Civil Service (Medical): No    Lack of Transportation (Non-Medical): No  Physical Activity: Inactive (02/21/2024)   Exercise Vital Sign    Days of Exercise per Week: 0 days    Minutes of Exercise per Session: Not on file  Stress: Stress Concern Present (02/21/2024)   [employer] of Occupational Health - Occupational Stress Questionnaire    Feeling of Stress: To some extent  Social Connections: Moderately Isolated (02/21/2024)   Social Connection and Isolation Panel    Frequency of Communication with Friends and Family: More than three times a week    Frequency of Social Gatherings with Friends and Family: Once a week    Attends Religious Services: Never    Database Administrator or Organizations: No    Attends Engineer, Structural: Not on file    Marital Status: Married  Catering Manager Violence: Not At Risk (11/02/2023)   Humiliation, Afraid, Rape, and Kick questionnaire    Fear of Current or Ex-Partner: No    Emotionally Abused: No    Physically Abused: No    Sexually Abused: No     PHYSICAL EXAM  There were no vitals filed for this visit.  There is no height or weight on file to calculate BMI.  Generalized: Well developed, in no acute distress  Cardiology: normal rate and rhythm, no murmur noted Respiratory: clear to auscultation bilaterally  Neurological examination  Mentation: Alert oriented to time, place, history taking. Follows all commands speech  and language fluent Cranial nerve II-XII: Pupils were equal round reactive to light. Extraocular movements were full, visual field were full  Motor: The motor testing reveals 5 over 5 strength of all 4 extremities. Good symmetric motor tone is noted throughout.  Gait and station: Gait is normal.    DIAGNOSTIC DATA (LABS, IMAGING, TESTING) - I reviewed patient records, labs, notes, testing and imaging myself where available.      No data to display           Lab Results  Component Value Date   WBC 6.9 10/28/2023   HGB 14.3 10/28/2023   HCT 44.6 10/28/2023   MCV 80.2 10/28/2023   PLT 276.0 10/28/2023      Component Value Date/Time   NA 140 02/28/2024 1122   NA 140 10/22/2015 0000   K 4.4 02/28/2024 1122   CL 102 02/28/2024 1122   CO2 24 02/28/2024 1122   GLUCOSE 109 (H) 02/28/2024 1122   BUN 16 02/28/2024 1122   BUN 18 10/22/2015 0000   CREATININE 0.88 02/28/2024 1122   CREATININE 0.85 10/05/2018 1332   CALCIUM  10.4 02/28/2024 1122   PROT 6.8 03/29/2022 1111   ALBUMIN  4.3 03/29/2022 1111   AST 15 10/28/2023 1104   ALT 15 10/28/2023 1104   ALKPHOS 64 03/29/2022 1111   BILITOT 0.9 03/29/2022 1111   GFRNONAA 58 (L) 08/13/2020 1449   GFRNONAA 68 10/05/2018 1332   GFRAA 55 (L) 02/23/2019 1457   GFRAA 79 10/05/2018 1332   Lab Results  Component Value Date   CHOL 109 10/28/2023   HDL 44.20 10/28/2023   LDLCALC 34 10/28/2023   LDLDIRECT 47.0 11/26/2021   TRIG 155.0 (H) 10/28/2023   CHOLHDL 2 10/28/2023   Lab Results  Component Value Date   HGBA1C 7.1 (H) 02/28/2024   No results found for: VITAMINB12 Lab Results  Component Value Date   TSH 2.85 03/29/2022     ASSESSMENT AND PLAN 78 y.o. year old female  has a past medical history of Allergy, Asthma, Atrophic vaginitis, BRCA gene mutation negative (2015), Cataract (2016), Diabetes mellitus without complication (HCC), DJD (degenerative joint disease), Dysrhythmia, Family history of malignant neoplasm of  breast, Fibromyalgia, GERD (gastroesophageal reflux disease), Gout, Hiatal hernia, History of kidney stones, Hyperlipidemia, Hypertension, Meniere's disease, Palpitations, Shortness of breath, Stroke (HCC), SUI (stress urinary incontinence, female), TIA (transient ischemic attack), and Vertigo. here with   No diagnosis found.    Kristy [name] is doing well on CPAP therapy. Compliance report reveals excellent compliance. She was encouraged to continue using CPAP nightly and for greater than 4 hours each night. We will update supply orders as indicated. Risks of untreated sleep apnea review and education materials provided. Healthy lifestyle habits encouraged. She will follow up in 1 year, sooner if needed. She verbalizes understanding and agreement with this plan.    No orders of the defined types were placed in this encounter.    No orders of the defined types were placed in this encounter.     Greig Forbes, FNP-C 04/16/2024, 8:21 AM [facility] [address] [place], [place] [address] [phone]

## 2024-04-16 NOTE — Patient Instructions (Signed)

## 2024-04-16 NOTE — Progress Notes (Unsigned)
 SABRA

## 2024-04-17 ENCOUNTER — Ambulatory Visit (INDEPENDENT_AMBULATORY_CARE_PROVIDER_SITE_OTHER): Payer: Medicare Other | Admitting: Family Medicine

## 2024-04-17 ENCOUNTER — Encounter: Payer: Self-pay | Admitting: Family Medicine

## 2024-04-17 VITALS — BP 138/84 | HR 112 | Resp 15 | Ht 65.0 in

## 2024-04-17 DIAGNOSIS — G4733 Obstructive sleep apnea (adult) (pediatric): Secondary | ICD-10-CM

## 2024-04-19 NOTE — Progress Notes (Signed)
 Chief Complaint: Discuss colonoscopy and change in bowel habits  HPI:    Kristy [name] is a 78 year old female, known to Dr. Legrand, with a past medical history as listed below including OSA on CPAP, diabetes, on Ozempic , hiatal hernia and stroke on aspirin , who was referred to me by Amon Aloysius BRAVO, MD for discussion of a colonoscopy and change in bowel habits.    07/23/2020 echo with LVEF 60-65% grade 1 diastolic dysfunction.    03/24/2021 colonoscopy done for history of adenomatous polyps last colonoscopy 5 years prior with 3 sessile polyps in the proximal ascending colon and cecum measuring 2 to 4 mm in size, 2 flat polyps in the transverse colon 2 to 12 mm in size, 2 sessile polyps in the distal rectum and sigmoid colon 3 to 5 mm in size.  Sigmoid colon redundant.  Pathology showed tubular adenomas.  Repeat recommended in 3 years for surveillance.    Today, patient presents to clinic and describes that she has had a change in bowel habits really over the past 3 months or so.  Tells me she always battles with chronic constipation, then was started on Ozempic  a couple of months ago and had episodes of diarrhea with nausea, vomiting and abdominal pain.  This subsided after about a month and a half and her last 2 doses have not caused those symptoms but she is now constipated.  She does not think she has had a bowel movement in a week.  Years ago she tried Investment [employer] which seemed to work well, but this did not help her recently when she added it back in.    Along with the above experiencing some increased reflux symptoms, apparently bothering her at night regardless of Omeprazole  40 mg in the morning.  Tells me this is trouble for a few weeks, a little bit better now.    Denies fever, chills, blood in her stool or symptoms that awaken her from sleep.     Past Medical History:  Diagnosis Date   Allergy    Environmental, foods: shellfish, lactose intolerant, citrus, peanuts   Asthma    mild persistent    Atrophic vaginitis    BRCA gene mutation negative 2015   testing done at [facility] long   Cataract 2016   had corrective cataract surgery both eyes   Diabetes mellitus without complication (HCC)    Type 2   DJD (degenerative joint disease)    generalized joint pain, including back   Dysrhythmia    SVT   Family history of malignant neoplasm of breast    Fibromyalgia    Dr Mai   GERD (gastroesophageal reflux disease)    Gout    Idiopathic, chronic   Hiatal hernia    History of kidney stones    Hyperlipidemia    Hypertension    Meniere's disease    R side worse, L side starting, see's Dr. Karis.   Palpitations    PSVT on Holter monitoring   Shortness of breath    occ   Stroke [facility])    TIA  2014   SUI (stress urinary incontinence, female)    (resolved after surgery)   TIA (transient ischemic attack)    07/2012, see Dr Rosemarie   Vertigo     Past Surgical History:  Procedure Laterality Date   ABDOMINAL HYSTERECTOMY  1994   TAH,BSO-BLADDER NECK SUSPENSION   BREAST SURGERY     LUMPECTOMY-BENIGN Breast Bx X2   CARPAL TUNNEL RELEASE Right 2005  CATARACT EXTRACTION, BILATERAL Bilateral 2016   Cataract    CHOLECYSTECTOMY     CHOLECYSTECTOMY, LAPAROSCOPIC  2001   CYSTOCELE REPAIR  1994   EYE SURGERY     cataracts   JOINT REPLACEMENT     bilatral knees;    KNEE ARTHROPLASTY  05/19/2012   Procedure: COMPUTER ASSISTED TOTAL KNEE ARTHROPLASTY;  Surgeon: Oneil JAYSON Herald, MD;  Location: [facility] OR;  Service: Orthopedics;  Laterality: Left;  Left  Total Knee Arthroplasty   KNEE ARTHROPLASTY Right 09/03/2013   Procedure: COMPUTER ASSISTED TOTAL KNEE ARTHROPLASTY;  Surgeon: Oneil JAYSON Herald, MD;  Location: [facility] OR;  Service: Orthopedics;  Laterality: Right;  Right Total Knee Arthroplasty, Computer Assist   KNEE ARTHROSCOPY     bil   LUMBAR LAMINECTOMY/DECOMPRESSION MICRODISCECTOMY N/A 08/18/2020   Procedure: L4-5 decompression, possible left L4-5 microdiscectomy;  Surgeon: Herald Oneil JAYSON, MD;   Location: [facility] OR;  Service: Orthopedics;  Laterality: N/A;   OOPHORECTOMY     BSO   SPINE SURGERY  07/2020   SURGERY FOR POSERIOR TIBIAL TENDON  2003   TONSILLECTOMY     TRIPLE FUSION LEFT ANKLE Left 2007   TUBAL LIGATION      Current Outpatient Medications  Medication Sig Dispense Refill   aspirin  EC 81 MG tablet Take 81 mg by mouth daily.     atorvastatin  (LIPITOR) 10 MG tablet Take 1 tablet (10 mg total) by mouth at bedtime. 90 tablet 1   Calcium  Carbonate (CALCIUM  600 PO) Take 600 mg by mouth in the morning and at bedtime. (1600 & bedtime)     Cholecalciferol  50 MCG (2000 UT) CAPS Take 2,000 Units by mouth at bedtime.      diltiazem  (CARDIZEM  CD) 240 MG 24 hr capsule Take 1 capsule (240 mg total) by mouth daily. 90 capsule 1   empagliflozin  (JARDIANCE ) 10 MG TABS tablet Take 1 tablet (10 mg total) by mouth daily before breakfast. 90 tablet 1   EPINEPHrine  (EPI-PEN) 0.3 mg/0.3 mL DEVI Inject 0.3 mg into the muscle See admin instructions. Inject 0.3 mg intramuscularly in the leg as needed for severe allergic reaction, call 911 and then inject again in the other leg. Reported on 06/26/2015     febuxostat  (ULORIC ) 40 MG tablet Take 40 mg by mouth every evening.      fluticasone  (FLONASE ) 50 MCG/ACT nasal spray Place 2 sprays into both nostrils in the morning.     fluticasone  (FLOVENT  HFA) 110 MCG/ACT inhaler Inhale 2 puffs into the lungs daily.     ipratropium (ATROVENT) 0.03 % nasal spray Place into the nose 3 (three) times daily as needed.     levalbuterol  (XOPENEX  HFA) 45 MCG/ACT inhaler Inhale 2 puffs into the lungs as needed for shortness of breath.     levocetirizine (XYZAL ) 5 MG tablet Take 5 mg by mouth daily at 4 PM.     losartan  (COZAAR ) 25 MG tablet Take 1 tablet (25 mg total) by mouth daily. 90 tablet 1   metFORMIN  (GLUCOPHAGE ) 850 MG tablet Take 1.5 tablets (1,275 mg total) by mouth 2 (two) times daily with a meal. 270 tablet 1   metoprolol  succinate (TOPROL  XL) 25 MG 24 hr  tablet Take 1.5 tablets (37.5 mg total) by mouth daily. 125 tablet 0   nortriptyline  (PAMELOR ) 25 MG capsule Take 25 mg by mouth daily at 4 PM.     omeprazole  (PRILOSEC) 40 MG capsule Take 1 capsule (40 mg total) by mouth daily. 90 capsule 1   Semaglutide ,0.25  or 0.5MG /DOS, (OZEMPIC , 0.25 OR 0.5 MG/DOSE,) 2 MG/3ML SOPN Inject 0.25 mg into the skin once a week. 9 mL 0   vitamin E  400 UNIT capsule Take 400 Units by mouth daily at 4 PM.     No current facility-administered medications for this visit.    Allergies as of 04/20/2024 - Review Complete 04/17/2024  Allergen Reaction Noted   Iodine Anaphylaxis    Shellfish allergy Anaphylaxis 03/23/2011   Sulfonamide derivatives Rash    Cephalexin Other (See Comments) 04/27/2021   Citrus Hives, Itching, and Other (See Comments) 02/23/2019   Lactose intolerance (gi) Diarrhea and Swelling 02/23/2019   Amoxicillin Diarrhea 05/10/2012    Family History  Problem Relation Age of Onset   Breast cancer Mother 70       deceased 41   Arthritis Mother    Cancer Mother    Cancer Father        Brain tumor; path?; deceased 41   Breast cancer Sister 48       bilateral breast ca @ 23; BRCA2 positive   Hypertension Sister    Cancer Sister    Hemochromatosis Sister    Thalassemia Sister    Other Sister        negative for BRCA1 BRCA2   Birth defects Sister    Cancer Sister    Heart failure Sister        due to chemo?   Lymphoma Maternal Aunt        deceased 30s   Cancer Maternal Uncle    CAD Son        MI   Diabetes Son    Leukemia Son    Breast cancer Cousin        BRCA2 positive   Diabetes Other    Breast cancer Other        distant maternal female relatives with breast cancer   Diabetes Maternal Grandmother    Cancer Son    Diabetes Son    Colon cancer Neg Hx    Colon polyps Neg Hx    Esophageal cancer Neg Hx    Rectal cancer Neg Hx    Stomach cancer Neg Hx     Social History   Socioeconomic History   Marital status: Married     Spouse name: Christopher   Number of children: 2   Years of education: 16   Highest education level: Some college, no degree  Occupational History   Occupation: Location Manager-- retired     Associate Professor: RETIRED  Tobacco Use   Smoking status: Never   Smokeless tobacco: Never  Vaping Use   Vaping status: Never Used  Substance and Sexual Activity   Alcohol use: No   Drug use: No   Sexual activity: Not Currently    Birth control/protection: Surgical  Other Topics Concern   Not on file  Social History Narrative   HSG   1 year college for accounting. married '69. 2 sons - '70, '72: retired - [employer].     ACP - OK for CPR at least once, no long term ventilation, no heroic measures in the face of poor quality of life. HCPOA - husband, secondary son Satya Bohall (c) [phone]   No Cafffeine (or very rare).    Right Handed       Social Drivers of Health   Financial Resource Strain: Low Risk  (02/21/2024)   Overall Financial Resource Strain (CARDIA)    Difficulty of Paying Living Expenses: Not hard at all  Food Insecurity: No Food Insecurity (02/21/2024)   Hunger Vital Sign    Worried About Running Out of Food in the Last Year: Never true    Ran Out of Food in the Last Year: Never true  Transportation Needs: No Transportation Needs (02/21/2024)   PRAPARE - Administrator, Civil Service (Medical): No    Lack of Transportation (Non-Medical): No  Physical Activity: Inactive (02/21/2024)   Exercise Vital Sign    Days of Exercise per Week: 0 days    Minutes of Exercise per Session: Not on file  Stress: Stress Concern Present (02/21/2024)   [employer] of Occupational Health - Occupational Stress Questionnaire    Feeling of Stress: To some extent  Social Connections: Moderately Isolated (02/21/2024)   Social Connection and Isolation Panel    Frequency of Communication with Friends and Family: More than three times a week    Frequency of Social Gatherings with Friends and Family: Once  a week    Attends Religious Services: Never    Database Administrator or Organizations: No    Attends Engineer, Structural: Not on file    Marital Status: Married  Catering Manager Violence: Not At Risk (11/02/2023)   Humiliation, Afraid, Rape, and Kick questionnaire    Fear of Current or Ex-Partner: No    Emotionally Abused: No    Physically Abused: No    Sexually Abused: No    Review of Systems:    Constitutional: No weight loss, fever or chills Skin: No rash  Cardiovascular: No chest pain Respiratory: No SOB  Gastrointestinal: See HPI and otherwise negative Genitourinary: No dysuria  Neurological: No headache, dizziness or syncope Musculoskeletal: No new muscle or joint pain Hematologic: No bleeding  Psychiatric: No history of depression or anxiety   Physical Exam:  Vital signs: BP 130/80   Pulse 100   Ht 5' 5 (1.651 m)   Wt 217 lb (98.4 kg)   BMI 36.11 kg/m    Constitutional:   Pleasant elderly Caucasian female appears to be in NAD, Well developed, Well nourished, alert and cooperative Head:  Normocephalic and atraumatic. Eyes:   PEERL, EOMI. No icterus. Conjunctiva pink. Ears:  Normal auditory acuity. Neck:  Supple Throat: Oral cavity and pharynx without inflammation, swelling or lesion.  Respiratory: Respirations even and unlabored. Lungs clear to auscultation bilaterally.   No wheezes, crackles, or rhonchi.  Cardiovascular: Normal S1, S2. No MRG. Regular rate and rhythm. No peripheral edema, cyanosis or pallor.  Gastrointestinal:  Soft, nondistended, mild epigastric ttp, No rebound or guarding. Normal bowel sounds. No appreciable masses or hepatomegaly. Rectal:  Not performed.  Msk:  Symmetrical without gross deformities. Without edema, no deformity or joint abnormality.  Neurologic:  Alert and  oriented x4;  grossly normal neurologically.  Skin:   Dry and intact without significant lesions or rashes. Psychiatric: Demonstrates good judgement and reason  without abnormal affect or behaviors.  RELEVANT LABS AND IMAGING: CBC    Component Value Date/Time   WBC 6.9 10/28/2023 1104   RBC 5.56 (H) 10/28/2023 1104   HGB 14.3 10/28/2023 1104   HCT 44.6 10/28/2023 1104   PLT 276.0 10/28/2023 1104   MCV 80.2 10/28/2023 1104   MCH 25.5 (L) 08/13/2020 1449   MCHC 32.0 10/28/2023 1104   RDW 17.3 (H) 10/28/2023 1104   LYMPHSABS 2.2 10/28/2023 1104   MONOABS 0.7 10/28/2023 1104   EOSABS 0.2 10/28/2023 1104   BASOSABS 0.1 10/28/2023 1104    CMP  Component Value Date/Time   NA 140 02/28/2024 1122   NA 140 10/22/2015 0000   K 4.4 02/28/2024 1122   CL 102 02/28/2024 1122   CO2 24 02/28/2024 1122   GLUCOSE 109 (H) 02/28/2024 1122   BUN 16 02/28/2024 1122   BUN 18 10/22/2015 0000   CREATININE 0.88 02/28/2024 1122   CREATININE 0.85 10/05/2018 1332   CALCIUM  10.4 02/28/2024 1122   PROT 6.8 03/29/2022 1111   ALBUMIN  4.3 03/29/2022 1111   AST 15 10/28/2023 1104   ALT 15 10/28/2023 1104   ALKPHOS 64 03/29/2022 1111   BILITOT 0.9 03/29/2022 1111   GFRNONAA 58 (L) 08/13/2020 1449   GFRNONAA 68 10/05/2018 1332   GFRAA 55 (L) 02/23/2019 1457   GFRAA 79 10/05/2018 1332    Assessment: 1.  History of adenomatous polyps:last colonoscopy in October 2022 with multiple adenomatous polyps, repeat recommended in 3 years patient would like to proceed 2.  Change in bowel habits: Likely some of this due to the addition of Ozempic  2 months ago, but patient states may have been having prior to that, now constipated which is her regular related to IBS-C per her 3.  Nausea and vomiting: Related to Ozempic  4.  GERD: Currently controlled on Omeprazole  40 mg but has been having some breakthrough, does not want any additional medicine at this point  Plan: 1.  Scheduled patient for a surveillance colonoscopy in the [facility] with Dr. Legrand.  She will have a 2-day bowel prep given history of constipation.  Did provide the patient a detailed list of risks for the  procedure and she agrees to proceed.  Patient is appropriate for endoscopic procedure(s) in the ambulatory ([facility]) setting. 2.  Continue Omeprazole  40 mg daily 3.  Discussed Ozempic  and its pathophysiology.  It is likely the source of most of her symptoms now. 4.  Recommend MiraLAX  daily. 5.  Advised to hold Ozempic  for 1 week prior to procedure. 6.  Prescribed Reglan  5 mg 20 to 30 minutes before each dose of bowel prep.  #2 with no refill. 7.  Patient to follow in clinic per recommendations after time of procedure.  Delon Failing, PA-C [facility] 04/19/2024, 10:52 AM  Cc: Amon Aloysius BRAVO, MD

## 2024-04-20 ENCOUNTER — Ambulatory Visit (INDEPENDENT_AMBULATORY_CARE_PROVIDER_SITE_OTHER): Admitting: Physician Assistant

## 2024-04-20 ENCOUNTER — Encounter: Payer: Self-pay | Admitting: Physician Assistant

## 2024-04-20 VITALS — BP 130/80 | HR 100 | Ht 65.0 in | Wt 217.0 lb

## 2024-04-20 DIAGNOSIS — K219 Gastro-esophageal reflux disease without esophagitis: Secondary | ICD-10-CM

## 2024-04-20 DIAGNOSIS — K5900 Constipation, unspecified: Secondary | ICD-10-CM

## 2024-04-20 DIAGNOSIS — K581 Irritable bowel syndrome with constipation: Secondary | ICD-10-CM

## 2024-04-20 DIAGNOSIS — Z860101 Personal history of adenomatous and serrated colon polyps: Secondary | ICD-10-CM | POA: Diagnosis not present

## 2024-04-20 MED ORDER — NA SULFATE-K SULFATE-MG SULF 17.5-3.13-1.6 GM/177ML PO SOLN: 1.0000 | Freq: Once | ORAL | 0 refills | Status: AC

## 2024-04-20 MED ORDER — METOCLOPRAMIDE HCL 5 MG PO TABS: ORAL_TABLET | ORAL | 0 refills | Status: DC

## 2024-04-20 NOTE — Patient Instructions (Addendum)
 Start Miralax  1 capful daily in 8 ounces of liquid.  We have sent the following medications to your pharmacy for you to pick up at your convenience: Reglan  5 mg - take 1 tablet 20-30 minutes before drinking colonoscopy prep.   You have been scheduled for a colonoscopy. Please follow written instructions given to you at your visit today.   If you use inhalers (even only as needed), please bring them with you on the day of your procedure.  DO NOT TAKE 7 DAYS PRIOR TO TEST- Trulicity (dulaglutide) Ozempic , Wegovy (semaglutide ) Mounjaro, Zepbound (tirzepatide) Bydureon Bcise (exanatide extended release)  DO NOT TAKE 1 DAY PRIOR TO YOUR TEST Rybelsus (semaglutide ) Adlyxin (lixisenatide) Victoza (liraglutide) Byetta (exanatide) ___________________________________________________________________________

## 2024-04-20 NOTE — Progress Notes (Signed)
 ____________________________________________________________  Attending physician addendum:  Thank you for sending this case to me. I have reviewed the entire note and agree with the plan.   Victory Brand, MD  ____________________________________________________________

## 2024-05-08 ENCOUNTER — Encounter: Payer: Self-pay | Admitting: Gastroenterology

## 2024-05-08 ENCOUNTER — Ambulatory Visit: Admitting: Gastroenterology

## 2024-05-08 VITALS — BP 119/67 | HR 72 | Temp 97.3°F | Resp 13 | Ht 65.0 in | Wt 217.0 lb

## 2024-05-08 DIAGNOSIS — K6289 Other specified diseases of anus and rectum: Secondary | ICD-10-CM | POA: Diagnosis not present

## 2024-05-08 DIAGNOSIS — D125 Benign neoplasm of sigmoid colon: Secondary | ICD-10-CM | POA: Diagnosis not present

## 2024-05-08 DIAGNOSIS — K648 Other hemorrhoids: Secondary | ICD-10-CM | POA: Diagnosis not present

## 2024-05-08 DIAGNOSIS — Z860101 Personal history of adenomatous and serrated colon polyps: Secondary | ICD-10-CM

## 2024-05-08 DIAGNOSIS — Z1211 Encounter for screening for malignant neoplasm of colon: Secondary | ICD-10-CM | POA: Diagnosis not present

## 2024-05-08 DIAGNOSIS — K562 Volvulus: Secondary | ICD-10-CM | POA: Diagnosis not present

## 2024-05-08 DIAGNOSIS — Q438 Other specified congenital malformations of intestine: Secondary | ICD-10-CM | POA: Diagnosis not present

## 2024-05-08 MED ORDER — SODIUM CHLORIDE 0.9 % IV SOLN: 500.0000 mL | INTRAVENOUS | Status: DC

## 2024-05-08 NOTE — Progress Notes (Unsigned)
 Pt's states no medical or surgical changes since previsit or office visit.

## 2024-05-08 NOTE — Progress Notes (Unsigned)
 Sedate, gd SR, tolerated procedure well, VSS, report to RN

## 2024-05-08 NOTE — Progress Notes (Unsigned)
 Called to room to assist during endoscopic procedure.  Patient ID and intended procedure confirmed with present staff. Received instructions for my participation in the procedure from the performing physician.

## 2024-05-08 NOTE — Progress Notes (Unsigned)
 No significant changes to clinical history since GI office visit on 04/20/24.  The patient is appropriate for an endoscopic procedure in the ambulatory setting.  - Victory Brand, MD

## 2024-05-08 NOTE — Patient Instructions (Addendum)
 Handout provided on polyps and hemorrhoids. Resume previous diet.  Continue present medications.  Await pathology results.  No repeat surveillance colonoscopy recommended due to age, current guidelines and today's findings.   YOU HAD AN ENDOSCOPIC PROCEDURE TODAY AT THE [facility]:   Refer to the procedure report that was given to you for any specific questions about what was found during the examination.  If the procedure report does not answer your questions, please call your gastroenterologist to clarify.  If you requested that your care partner not be given the details of your procedure findings, then the procedure report has been included in a sealed envelope for you to review at your convenience later.  YOU SHOULD EXPECT: Some feelings of bloating in the abdomen. Passage of more gas than usual.  Walking can help get rid of the air that was put into your GI tract during the procedure and reduce the bloating. If you had a lower endoscopy (such as a colonoscopy or flexible sigmoidoscopy) you may notice spotting of blood in your stool or on the toilet paper. If you underwent a bowel prep for your procedure, you may not have a normal bowel movement for a few days.  Please Note:  You might notice some irritation and congestion in your nose or some drainage.  This is from the oxygen  used during your procedure.  There is no need for concern and it should clear up in a day or so.  SYMPTOMS TO REPORT IMMEDIATELY:  Following lower endoscopy (colonoscopy or flexible sigmoidoscopy):  Excessive amounts of blood in the stool  Significant tenderness or worsening of abdominal pains  Swelling of the abdomen that is new, acute  Fever of 100F or higher  For urgent or emergent issues, a gastroenterologist can be reached at any hour by calling [phone]. Do not use MyChart messaging for urgent concerns.    DIET:  We do recommend a small meal at first, but then you may proceed to your  regular diet.  Drink plenty of fluids but you should avoid alcoholic beverages for 24 hours.  ACTIVITY:  You should plan to take it easy for the rest of today and you should NOT DRIVE or use heavy machinery until tomorrow (because of the sedation medicines used during the test).    FOLLOW UP: Our staff will call the number listed on your records the next business day following your procedure.  We will call around 7:15- 8:00 am to check on you and address any questions or concerns that you may have regarding the information given to you following your procedure. If we do not reach you, we will leave a message.     If any biopsies were taken you will be contacted by phone or by letter within the next 1-3 weeks.  Please call us  at [phone] if you have not heard about the biopsies in 3 weeks.    SIGNATURES/CONFIDENTIALITY: You and/or your care partner have signed paperwork which will be entered into your electronic medical record.  These signatures attest to the fact that that the information above on your After Visit Summary has been reviewed and is understood.  Full responsibility of the confidentiality of this discharge information lies with you and/or your care-partner.

## 2024-05-08 NOTE — Op Note (Signed)
 [facility] Patient Name: Kristy [name] Procedure Date: 05/08/2024 4:17 PM MRN: [id number] Endoscopist: Victory L. Legrand , MD, [id number] Age: 78 Referring MD:  Date of Birth: 05/19/1946 Gender: Female Account #: [id number] Procedure:                Colonoscopy Indications:              Surveillance: Personal history of adenomatous                            polyps on last colonoscopy 3 years ago                           SubCM TAs in 2022                           patient incidentally reports progressive                            constipation in the last 6 months that is recently                            improved with regular use of miralax  since an                            office visit with APP Medicines:                Monitored Anesthesia Care Procedure:                Pre-Anesthesia Assessment:                           - Prior to the procedure, a History and Physical                            was performed, and patient medications and                            allergies were reviewed. The patient's tolerance of                            previous anesthesia was also reviewed. The risks                            and benefits of the procedure and the sedation                            options and risks were discussed with the patient.                            All questions were answered, and informed consent                            was obtained. Prior Anticoagulants: The patient has  taken no anticoagulant or antiplatelet agents. ASA                            Grade Assessment: III - A patient with severe                            systemic disease. After reviewing the risks and                            benefits, the patient was deemed in satisfactory                            condition to undergo the procedure.                           After obtaining informed consent, the colonoscope                            was passed under  direct vision. Throughout the                            procedure, the patient's blood pressure, pulse, and                            oxygen  saturations were monitored continuously. The                            Olympus Scope SN: [id number] was introduced through                            the anus and advanced to the the cecum, identified                            by the ileocecal valve and cecal cap. The                            colonoscopy was performed with difficulty due to a                            redundant colon and significant looping. Successful                            completion of the procedure was aided by using                            manual pressure and straightening and shortening                            the scope to obtain bowel loop reduction. The                            patient tolerated the procedure well. The quality  of the bowel preparation was good after lavage                            except the cecum was fair (obscuring AO with opaque                            liquid and debris). The ileocecal valve and the                            rectum were photographed. Scope In: 4:32:19 PM Scope Out: 4:58:57 PM Scope Withdrawal Time: 0 hours 19 minutes 7 seconds  Total Procedure Duration: 0 hours 26 minutes 38 seconds  Findings:                 The perianal and digital rectal examinations were                            normal.                           The colon (entire examined portion) was                            significantly redundant.                           A diminutive polyp was found in the distal sigmoid                            colon. The polyp was sessile. The polyp was removed                            with a cold snare. Resection and retrieval were                            complete.                           Internal hemorrhoids were found. Small                            hypertrophied anal papilla as  well.                           The exam was otherwise without abnormality on                            direct and retroflexion views. Complications:            No immediate complications. Estimated Blood Loss:     Estimated blood loss was minimal. Impression:               - Redundant colon.                           - One diminutive polyp in the distal sigmoid colon,  removed with a cold snare. Resected and retrieved.                           - Internal hemorrhoids.                           - The examination was otherwise normal on direct                            and retroflexion views. Recommendation:           - Patient has a contact number available for                            emergencies. The signs and symptoms of potential                            delayed complications were discussed with the                            patient. Return to normal activities tomorrow.                            Written discharge instructions were provided to the                            patient.                           - Resume previous diet.                           - Continue present medications.                           - Await pathology results.                           - No repeat surveillance colonoscopy recommended                            due to age, current guidelines and today's findings. Kristy [name] L. Legrand, MD 05/08/2024 5:06:29 PM This report has been signed electronically.

## 2024-05-09 ENCOUNTER — Telehealth: Payer: Self-pay

## 2024-05-09 NOTE — Telephone Encounter (Signed)
°  Follow up Call-     05/08/2024    3:04 PM  Call back number  Post procedure Call Back phone  # [phone]  Permission to leave phone message Yes     Patient questions:  Do you have a fever, pain , or abdominal swelling? No. Pain Score  0 *  Have you tolerated food without any problems? Yes.    Have you been able to return to your normal activities? Yes.    Do you have any questions about your discharge instructions: Diet   No. Medications  No. Follow up visit  No.  Do you have questions or concerns about your Care? No.  Actions: * If pain score is 4 or above: No action needed, pain <4.

## 2024-05-11 ENCOUNTER — Other Ambulatory Visit: Payer: Self-pay | Admitting: Internal Medicine

## 2024-05-11 LAB — SURGICAL PATHOLOGY

## 2024-05-15 ENCOUNTER — Ambulatory Visit: Payer: Self-pay | Admitting: Gastroenterology

## 2024-05-29 ENCOUNTER — Encounter: Payer: Self-pay | Admitting: Internal Medicine

## 2024-05-29 ENCOUNTER — Other Ambulatory Visit (HOSPITAL_COMMUNITY): Payer: Self-pay

## 2024-05-29 ENCOUNTER — Telehealth: Payer: Self-pay

## 2024-05-29 ENCOUNTER — Ambulatory Visit (INDEPENDENT_AMBULATORY_CARE_PROVIDER_SITE_OTHER): Admitting: Internal Medicine

## 2024-05-29 VITALS — BP 126/60 | HR 98 | Temp 98.0°F | Resp 18 | Ht 65.0 in | Wt 214.2 lb

## 2024-05-29 DIAGNOSIS — Z7985 Long-term (current) use of injectable non-insulin antidiabetic drugs: Secondary | ICD-10-CM

## 2024-05-29 DIAGNOSIS — E1159 Type 2 diabetes mellitus with other circulatory complications: Secondary | ICD-10-CM

## 2024-05-29 DIAGNOSIS — Z7984 Long term (current) use of oral hypoglycemic drugs: Secondary | ICD-10-CM | POA: Diagnosis not present

## 2024-05-29 MED ORDER — TIRZEPATIDE 2.5 MG/0.5ML ~~LOC~~ SOAJ: 2.5000 mg | SUBCUTANEOUS | 0 refills | Status: DC

## 2024-05-29 MED ORDER — METFORMIN HCL 850 MG PO TABS: 850.0000 mg | ORAL_TABLET | Freq: Two times a day (BID) | ORAL | Status: AC

## 2024-05-29 NOTE — Assessment & Plan Note (Signed)
 DM with history of TIA At the last visit, started Ozempic , having significant side effects mostly nausea and vomiting.  See HPI. She seems to have also some problems with metformin .  Plan: -Discontinued Ozempic , two-week washout. - Initiate Mounjaro at low dose after the washout period. - Continue metformin , decrease the dose from 1.5 to 1  tablet twice daily. - Continue Jardiance  as prescribed. - Let us  know if nausea persists.  Dizziness, seen by ENT October 2025, they noted worse sensorineural hearing loss on the R side, MRI scan was negative. They discussed the diagnosis of Mnire's.  Next visit April 2026 RTC 2 months

## 2024-05-29 NOTE — Telephone Encounter (Signed)
 Pharmacy Patient Advocate Encounter   Received notification from Physician's Office that prior authorization for Mounjaro 2.5 mg/0.5 ml is required/requested.   Insurance verification completed.   The patient is insured through [employer].   Per test claim: The current 28 day co-pay is, $0.  No PA needed at this time. This test claim was processed through [facility]- copay amounts may vary at other pharmacies due to pharmacy/plan contracts, or as the patient moves through the different stages of their insurance plan.

## 2024-05-29 NOTE — Telephone Encounter (Signed)
 Pt changing from Ozempic  to Mounjaro- will need PA please.

## 2024-05-29 NOTE — Progress Notes (Signed)
 "  Subjective:    Patient ID: Kristy [name], female    DOB: February 26, 1946, 78 y.o.   MRN: [id number]  DOS:  05/29/2024 Follow-up   Discussed the use of AI scribe software for clinical note transcription with the patient, who gave verbal consent to proceed.  History of Present Illness Kristy [name] is a 78 year old female with diabetes who presents with persistent nausea and vomiting after Ozempic  injections.  Gastrointestinal symptoms - Persistent nausea, dizziness, dry heaves, and vomiting beginning approximately two days after weekly Friday Ozempic  injection at 0.25 mg. - Symptoms are triggered by attempts to eat after initial improvement. - Pattern of symptoms has been consistent. - Occasional diarrhea associated with episodes of vomiting. - No blood in stool, fever, or chills.  Antidiabetic medication intolerance - Metformin  prescribed as 850 mg, 1.5 tablets twice daily; dose decreased to 1 tablet twice daily due to nausea and vomiting. - Symptoms improved after reducing metformin  dose. - Continues to take Jardiance  for diabetes.  Glycemic control - Home blood glucose mildly elevated but stable. - Average blood glucose 131 mg/dL last week and 875 mg/dL this morning. - Fasting glucose checked daily. - Blood sugars have mostly remained in the 120s after metformin  dose reduction.  Gout and renal status - Uloric  (febuxostat ) not taken for six weeks. - No gout flares    Wt Readings from Last 3 Encounters:  05/29/24 214 lb 4 oz (97.2 kg)  05/08/24 217 lb (98.4 kg)  04/20/24 217 lb (98.4 kg)     Review of Systems See above   Past Medical History:  Diagnosis Date   Allergy    Environmental, foods: shellfish, lactose intolerant, citrus, peanuts   Asthma    mild persistent   Atrophic vaginitis    BRCA gene mutation negative 2015   testing done at [facility]   Cataract 2016   had corrective cataract surgery both eyes   Diabetes mellitus without complication (HCC)     Type 2   DJD (degenerative joint disease)    generalized joint pain, including back   Dysrhythmia    SVT   Family history of malignant neoplasm of breast    Fibromyalgia    Dr Mai   GERD (gastroesophageal reflux disease)    Gout    Idiopathic, chronic   Hiatal hernia    History of kidney stones    Hyperlipidemia    Hypertension    Meniere's disease    R side worse, L side starting, see's Dr. Karis.   Palpitations    PSVT on Holter monitoring   Shortness of breath    occ   Stroke [facility])    TIA  2014   SUI (stress urinary incontinence, female)    (resolved after surgery)   TIA (transient ischemic attack)    07/2012, see Dr Rosemarie   Vertigo     Past Surgical History:  Procedure Laterality Date   ABDOMINAL HYSTERECTOMY  1994   TAH,BSO-BLADDER NECK SUSPENSION   BREAST SURGERY     LUMPECTOMY-BENIGN Breast Bx X2   CARPAL TUNNEL RELEASE Right 2005   CATARACT EXTRACTION, BILATERAL Bilateral 2016   Cataract    CHOLECYSTECTOMY     CHOLECYSTECTOMY, LAPAROSCOPIC  2001   COLONOSCOPY  03/24/2021   CYSTOCELE REPAIR  1994   EYE SURGERY     cataracts   JOINT REPLACEMENT     bilatral knees;    KNEE ARTHROPLASTY  05/19/2012   Procedure: COMPUTER ASSISTED TOTAL KNEE ARTHROPLASTY;  Surgeon: Oneil JAYSON Herald, MD;  Location: [facility] OR;  Service: Orthopedics;  Laterality: Left;  Left  Total Knee Arthroplasty   KNEE ARTHROPLASTY Right 09/03/2013   Procedure: COMPUTER ASSISTED TOTAL KNEE ARTHROPLASTY;  Surgeon: Oneil JAYSON Herald, MD;  Location: [facility] OR;  Service: Orthopedics;  Laterality: Right;  Right Total Knee Arthroplasty, Computer Assist   KNEE ARTHROSCOPY     bil   LUMBAR LAMINECTOMY/DECOMPRESSION MICRODISCECTOMY N/A 08/18/2020   Procedure: L4-5 decompression, possible left L4-5 microdiscectomy;  Surgeon: Herald Oneil JAYSON, MD;  Location: [facility] OR;  Service: Orthopedics;  Laterality: N/A;   OOPHORECTOMY     BSO   SPINE SURGERY  07/2020   SURGERY FOR POSERIOR TIBIAL TENDON  2003   TONSILLECTOMY      TRIPLE FUSION LEFT ANKLE Left 2007   TUBAL LIGATION      Current Outpatient Medications  Medication Instructions   aspirin  EC 81 mg, Daily   atorvastatin  (LIPITOR) 10 mg, Oral, Daily at bedtime   Calcium  Carbonate (CALCIUM  600 PO) 600 mg, 2 times daily   Cholecalciferol  2,000 Units, Daily at bedtime   diltiazem  (CARDIZEM  CD) 240 mg, Oral, Daily   empagliflozin  (JARDIANCE ) 10 mg, Oral, Daily before breakfast   EPINEPHrine  (EPI-PEN) 0.3 mg, See admin instructions   febuxostat  (ULORIC ) 40 mg, Every evening   fluticasone  (FLONASE ) 50 MCG/ACT nasal spray 2 sprays, Every morning   fluticasone  (FLOVENT  HFA) 110 MCG/ACT inhaler 2 puffs, Daily   ipratropium (ATROVENT) 0.03 % nasal spray 3 times daily PRN   levalbuterol  (XOPENEX  HFA) 45 MCG/ACT inhaler 2 puffs, As needed   levocetirizine (XYZAL ) 5 mg, Daily-1600   losartan  (COZAAR ) 25 mg, Oral, Daily   metFORMIN  (GLUCOPHAGE ) 850 mg, Oral, 2 times daily with meals   metoprolol  succinate (TOPROL  XL) 37.5 mg, Oral, Daily   nortriptyline  (PAMELOR ) 25 mg, Daily-1600   omeprazole  (PRILOSEC) 40 mg, Oral, Daily   tirzepatide (MOUNJARO) 2.5 mg, Subcutaneous, Weekly   vitamin E  400 Units, Daily-1600       Objective:   Physical Exam BP 126/60   Pulse 98   Temp 98 F (36.7 C) (Oral)   Resp 18   Ht 5' 5 (1.651 m)   Wt 214 lb 4 oz (97.2 kg)   SpO2 97%   BMI 35.65 kg/m  General:   Well developed, NAD, BMI noted. HEENT:  Normocephalic . Face symmetric, atraumatic Lungs:  CTA B Normal respiratory effort, no intercostal retractions, no accessory muscle use. Heart: RRR,  no murmur.  Lower extremities: no pretibial edema bilaterally  Skin: Not pale. Not jaundice Neurologic:  alert & oriented X3.  Speech normal, gait appropriate for age and unassisted Psych--  Cognition and judgment appear intact.  Cooperative with normal attention span and concentration.  Behavior appropriate. No anxious or depressed appearing. '    Assessment    Assessment DM w/ h/o TIA Neuropathy Dx 09/2023 HTN  Hyperlipidemia  Insomnia  Asthma , allergies-- sees Dr Fleeta smock  GERD, hiatal hernia , h/o IBS Obesity  CV: ---Palpitations: PSVT by Holter, Dr Court ---TIA, dx 2014,last visit w/  Dr. Rosemarie 09-2015, RTC prn ENT: Dr Donnajean -Vertigo (improved with vestibular rehabilitation 2015) -HOH- aids, brain  MRI 03/2019.   -Menier's (Dx   2020) MSK: *Osteopenia: dexa per Gyn *Fibromyalgia, DJD, Gout : per Dr. Mai, on P[place]or   *Hyperparathyroidism h/o slt increased PTH 2015 , normal PTH 05-2014  *Gout- Dr Mai  GU:  --Kidney stones, h/o; Urinary incontinence; Atrophic vaginitis OSA dx 08-2021, started CPAP  10/2021 FH breast cancer , sister is BRCA2 (+), pt is  (-) Increased LFTs --- CT abdomen 2014: NormalLiver, negative hepatitis serologies at rheumatology per patient  Assessment & Plan DM with history of TIA At the last visit, started Ozempic , having significant side effects mostly nausea and vomiting.  See HPI. She seems to have also some problems with metformin .  Plan: -Discontinued Ozempic , two-week washout. - Initiate Mounjaro at low dose after the washout period. - Continue metformin , decrease the dose from 1.5 to 1  tablet twice daily. - Continue Jardiance  as prescribed. - Let us  know if nausea persists.  Dizziness, seen by ENT October 2025, they noted worse sensorineural hearing loss on the R side, MRI scan was negative. They discussed the diagnosis of Mnire's.  Next visit April 2026 RTC 2 months     "

## 2024-05-29 NOTE — Patient Instructions (Addendum)
" °  Go to the front desk for the checkout Please make an appointment         TYPE 2 DIABETES MELLITUS:  - You have been experiencing nausea and vomiting after your Ozempic  injections, stop taking Ozempic    -Continue taking metformin  at the reduced dose of one tablet twice daily. -Continue taking Jardiance  as prescribed.  -After the two-week washout period, start taking Mounjaro at a low dose. -If nausea continues after starting Mounjaro, take additional doses as needed.  -Your Mounjaro prescription has been sent to [facility] for three months.  -Contact us  if your nausea continues.       "

## 2024-05-30 ENCOUNTER — Other Ambulatory Visit: Payer: Self-pay

## 2024-05-30 ENCOUNTER — Encounter: Payer: Self-pay | Admitting: Internal Medicine

## 2024-05-30 MED ORDER — TIRZEPATIDE 2.5 MG/0.5ML ~~LOC~~ SOAJ: 2.5000 mg | SUBCUTANEOUS | 0 refills | Status: AC

## 2024-06-13 ENCOUNTER — Other Ambulatory Visit: Payer: Self-pay | Admitting: Internal Medicine

## 2024-06-16 ENCOUNTER — Other Ambulatory Visit: Payer: Self-pay | Admitting: Cardiology

## 2024-06-18 NOTE — Progress Notes (Unsigned)
 " Cardiology Office Note:    Date:  06/21/2024   ID:  Kristy [name], Kristy [name] [birth year], MRN [id number]  PCP:  Amon Aloysius BRAVO, MD  Cardiologist:  Lonni LITTIE Nanas, MD  Electrophysiologist:  None   Referring MD: Amon Aloysius BRAVO, MD   Chief Complaint  Patient presents with   Chest Pain    History of Present Illness:    Kristy [name] is a 78 y.o. female with a hx of TIA, fibromyalgia, SVT, OSA who presents for follow-up.  She was referred by Dr.Paz for preop evaluation prior to spinal surgery, initially seen on 07/18/2020.  She previously followed with Dr. Court, last seen in 2017.  Had been followed for SVT. Echocardiogram in 2014 showed no significant abnormalities.  At initial clinic visit, reported dyspnea with minimal exertion.  Lexiscan  Myoview  was done on 07/22/2020, which showed fixed basal to mid anteroseptal defect likely representing artifact versus infarct, EF 49%.  Echocardiogram on 07/23/2020 showed LVEF 60 to 65%, grade 1 diastolic dysfunction, normal RV function, no significant valvular disease.  Zio patch x 14 days 02/2022 showed 3 episodes of SVT with longest lasting 5 beats and 1 episode of second-degree Mobitz 2 AV block lasting 2.4 seconds and occurred during night.  Since last clinic visit, she reports she has been having chest pain.  Describes pain on left side of chest, occurs 1-2 times per week.  No clear relationship with exertion but does report she gets short of breath with exertion.  Reports some lightheadedness but no syncope.  Denies any lower extremity edema or palpitations.  Past Medical History:  Diagnosis Date   Allergy    Environmental, foods: shellfish, lactose intolerant, citrus, peanuts   Asthma    mild persistent   Atrophic vaginitis    BRCA gene mutation negative 2015   testing done at [facility]   Cataract 2016   had corrective cataract surgery both eyes   Diabetes mellitus without complication (HCC)    Type 2   DJD (degenerative joint disease)     generalized joint pain, including back   Dysrhythmia    SVT   Family history of malignant neoplasm of breast    Fibromyalgia    Dr Mai   GERD (gastroesophageal reflux disease)    Gout    Idiopathic, chronic   Hiatal hernia    History of kidney stones    Hyperlipidemia    Hypertension    Meniere's disease    R side worse, L side starting, see's Dr. Karis.   Palpitations    PSVT on Holter monitoring   Shortness of breath    occ   Stroke [facility])    TIA  2014   SUI (stress urinary incontinence, female)    (resolved after surgery)   TIA (transient ischemic attack)    07/2012, see Dr Rosemarie   Vertigo     Past Surgical History:  Procedure Laterality Date   ABDOMINAL HYSTERECTOMY  1994   TAH,BSO-BLADDER NECK SUSPENSION   BREAST SURGERY     LUMPECTOMY-BENIGN Breast Bx X2   CARPAL TUNNEL RELEASE Right 2005   CATARACT EXTRACTION, BILATERAL Bilateral 2016   Cataract    CHOLECYSTECTOMY     CHOLECYSTECTOMY, LAPAROSCOPIC  2001   COLONOSCOPY  03/24/2021   CYSTOCELE REPAIR  1994   EYE SURGERY     cataracts   JOINT REPLACEMENT     bilatral knees;    KNEE ARTHROPLASTY  05/19/2012   Procedure: COMPUTER ASSISTED TOTAL KNEE ARTHROPLASTY;  Surgeon: Oneil JAYSON Herald, MD;  Location: [facility] OR;  Service: Orthopedics;  Laterality: Left;  Left  Total Knee Arthroplasty   KNEE ARTHROPLASTY Right 09/03/2013   Procedure: COMPUTER ASSISTED TOTAL KNEE ARTHROPLASTY;  Surgeon: Oneil JAYSON Herald, MD;  Location: [facility] OR;  Service: Orthopedics;  Laterality: Right;  Right Total Knee Arthroplasty, Computer Assist   KNEE ARTHROSCOPY     bil   LUMBAR LAMINECTOMY/DECOMPRESSION MICRODISCECTOMY N/A 08/18/2020   Procedure: L4-5 decompression, possible left L4-5 microdiscectomy;  Surgeon: Herald Oneil JAYSON, MD;  Location: [facility] OR;  Service: Orthopedics;  Laterality: N/A;   OOPHORECTOMY     BSO   SPINE SURGERY  07/2020   SURGERY FOR POSERIOR TIBIAL TENDON  2003   TONSILLECTOMY     TRIPLE FUSION LEFT ANKLE Left 2007   TUBAL  LIGATION      Current Medications: Current Meds  Medication Sig   albuterol  (VENTOLIN  HFA) 108 (90 Base) MCG/ACT inhaler 2 puffs as needed Inhalation every 4 hrs prn for cough/wheeze; Duration: 90 days   aspirin  EC 81 MG tablet Take 81 mg by mouth daily.   atorvastatin  (LIPITOR) 10 MG tablet Take 1 tablet (10 mg total) by mouth at bedtime.   Calcium  Carbonate (CALCIUM  600 PO) Take 600 mg by mouth in the morning and at bedtime. (1600 & bedtime)   Cholecalciferol  50 MCG (2000 UT) CAPS Take 2,000 Units by mouth at bedtime.    diltiazem  (CARDIZEM  CD) 240 MG 24 hr capsule Take 1 capsule (240 mg total) by mouth daily.   empagliflozin  (JARDIANCE ) 10 MG TABS tablet Take 1 tablet (10 mg total) by mouth daily before breakfast.   EPINEPHrine  (EPI-PEN) 0.3 mg/0.3 mL DEVI Inject 0.3 mg into the muscle See admin instructions. Inject 0.3 mg intramuscularly in the leg as needed for severe allergic reaction, call 911 and then inject again in the other leg. Reported on 06/26/2015   fluticasone  (FLONASE ) 50 MCG/ACT nasal spray Place 2 sprays into both nostrils in the morning.   fluticasone  (FLOVENT  HFA) 110 MCG/ACT inhaler Inhale 2 puffs into the lungs daily.   ipratropium (ATROVENT) 0.03 % nasal spray Place into the nose 3 (three) times daily as needed.   levalbuterol  (XOPENEX  HFA) 45 MCG/ACT inhaler Inhale 2 puffs into the lungs as needed for shortness of breath.   levocetirizine (XYZAL ) 5 MG tablet Take 5 mg by mouth daily at 4 PM.   losartan  (COZAAR ) 25 MG tablet Take 1 tablet (25 mg total) by mouth daily.   metFORMIN  (GLUCOPHAGE ) 850 MG tablet Take 1 tablet (850 mg total) by mouth 2 (two) times daily with a meal.   metoprolol  succinate (TOPROL -XL) 25 MG 24 hr tablet Take 1.5 tablets (37.5 mg total) by mouth daily.   nortriptyline  (PAMELOR ) 25 MG capsule Take 25 mg by mouth daily at 4 PM.   omeprazole  (PRILOSEC) 40 MG capsule Take 1 capsule (40 mg total) by mouth daily.   vitamin E  400 UNIT capsule Take 400  Units by mouth daily at 4 PM.     Allergies:   Iodine, Shellfish allergy, Sulfonamide derivatives, Amoxicillin, Cephalexin, Citrus, and Lactose intolerance (gi)   Social History   Socioeconomic History   Marital status: Married    Spouse name: Brodie Scovell   Number of children: 2   Years of education: 16   Highest education level: Some college, no degree  Occupational History   Occupation: Location Manager-- retired     Associate Professor: RETIRED  Tobacco Use   Smoking status: Never  Smokeless tobacco: Never  Vaping Use   Vaping status: Never Used  Substance and Sexual Activity   Alcohol use: No   Drug use: No   Sexual activity: Not Currently    Birth control/protection: Surgical  Other Topics Concern   Not on file  Social History Narrative   HSG   1 year college for accounting. married '69. 2 sons - '70, '72: retired - [employer].     ACP - OK for CPR at least once, no long term ventilation, no heroic measures in the face of poor quality of life. HCPOA - husband, secondary son Samarra Ridgely (c) [phone]   No Cafffeine (or very rare).    Right Handed       Social Drivers of Health   Tobacco Use: Low Risk (06/21/2024)   Patient History    Smoking Tobacco Use: Never    Smokeless Tobacco Use: Never    Passive Exposure: Not on file  Financial Resource Strain: Low Risk (05/22/2024)   Overall Financial Resource Strain (CARDIA)    Difficulty of Paying Living Expenses: Not hard at all  Food Insecurity: No Food Insecurity (05/22/2024)   Epic    Worried About Radiation Protection Practitioner of Food in the Last Year: Never true    Ran Out of Food in the Last Year: Never true  Transportation Needs: No Transportation Needs (05/22/2024)   Epic    Lack of Transportation (Medical): No    Lack of Transportation (Non-Medical): No  Physical Activity: Inactive (05/22/2024)   Exercise Vital Sign    Days of Exercise per Week: 0 days    Minutes of Exercise per Session: Not on file  Stress: No Stress Concern Present  (05/22/2024)   [employer] of Occupational Health - Occupational Stress Questionnaire    Feeling of Stress: Not at all  Social Connections: Moderately Isolated (05/22/2024)   Social Connection and Isolation Panel    Frequency of Communication with Friends and Family: More than three times a week    Frequency of Social Gatherings with Friends and Family: Once a week    Attends Religious Services: Never    Database Administrator or Organizations: No    Attends Engineer, Structural: Not on file    Marital Status: Married  Depression (PHQ2-9): Low Risk (05/29/2024)   Depression (PHQ2-9)    PHQ-2 Score: 0  Alcohol Screen: Low Risk (11/02/2023)   Alcohol Screen    Last Alcohol Screening Score (AUDIT): 0  Housing: Low Risk (05/22/2024)   Epic    Unable to Pay for Housing in the Last Year: No    Number of Times Moved in the Last Year: 0    Homeless in the Last Year: No  Utilities: Not At Risk (11/02/2023)   AHC Utilities    Threatened with loss of utilities: No  Health Literacy: Adequate Health Literacy (11/02/2023)   B1300 Health Literacy    Frequency of need for help with medical instructions: Never     Family History: The patient's family history includes Arthritis in her mother; Birth defects in her sister; Breast cancer in her cousin and another family member; Breast cancer (age of onset: 38) in her mother; Breast cancer (age of onset: 56) in her sister; CAD in her son; Cancer in her father, maternal uncle, mother, sister, sister, and son; Diabetes in her maternal grandmother, son, son, and another family member; Heart failure in her sister; Hemochromatosis in her sister; Hypertension in her sister; Leukemia in her son; Lymphoma  in her maternal aunt; Other in her sister; Thalassemia in her sister. There is no history of Colon cancer, Colon polyps, Esophageal cancer, Rectal cancer, or Stomach cancer.  ROS:   Please see the history of present illness.     All other systems  reviewed and are negative.  EKGs/Labs/Other Studies Reviewed:    The following studies were reviewed today:   EKG:  06/21/2024: Normal sinus rhythm, PACs, rate 93 03/24/2022: Normal sinus rhythm, rate 80, no ST abnormalities 07/18/2020- The ekg ordered demonstrates normal sinus rhythm with sinus arrhythmia, rate 75, no ST abnormalities  Recent Labs: 10/28/2023: ALT 15; Hemoglobin 14.3; Platelets 276.0 02/28/2024: BUN 16; Creatinine, Ser 0.88; Potassium 4.4; Sodium 140  Recent Lipid Panel    Component Value Date/Time   CHOL 109 10/28/2023 1104   TRIG 155.0 (H) 10/28/2023 1104   HDL 44.20 10/28/2023 1104   CHOLHDL 2 10/28/2023 1104   VLDL 31.0 10/28/2023 1104   LDLCALC 34 10/28/2023 1104   LDLDIRECT 47.0 11/26/2021 1104    Physical Exam:    VS:  BP 128/78   Pulse 92   Ht 5' 5 (1.651 m)   Wt 215 lb (97.5 kg)   SpO2 92%   BMI 35.78 kg/m     Wt Readings from Last 3 Encounters:  06/21/24 215 lb (97.5 kg)  05/29/24 214 lb 4 oz (97.2 kg)  05/08/24 217 lb (98.4 kg)     GEN:  in no acute distress HEENT: Normal NECK: No JVD; No carotid bruits CARDIAC: RRR, no murmurs, rubs, gallops RESPIRATORY:  Clear to auscultation without rales, wheezing or rhonchi  ABDOMEN: Soft, non-tender, non-distended MUSCULOSKELETAL:  No edema; No deformity  SKIN: Warm and dry NEUROLOGIC:  Alert and oriented x 3 PSYCHIATRIC:  Normal affect   ASSESSMENT:    1. Precordial pain   2. SOB (shortness of breath)   3. Essential hypertension   4. SVT (supraventricular tachycardia)   5. Hyperlipidemia, unspecified hyperlipidemia type      PLAN:    Chest pain/dyspnea: At initial clinic visit, reported dyspnea with minimal exertion.  Lexiscan  Myoview  was done on 07/22/2020, which showed fixed basal to mid anteroseptal defect likely representing artifact versus infarct, EF 49%.  Echocardiogram on 07/23/2020 showed LVEF 60 to 65%, grade 1 diastolic dysfunction, normal RV function, no significant valvular  disease. Given echo findings, suspect abnormality on Myoview  represents artifact - She is reporting chest pain that is new, along with dyspnea on exertion.  Recommend stress PET to evaluate for ischemia.  Not a treadmill candidate  SVT: On diltiazem  240 mg daily and Toprol -XL 37.5 mg daily.  Zio patch x 14 days 02/2022 showed 3 episodes of SVT with longest lasting 5 beats and 1 episode of second-degree Mobitz 2 AV block lasting 2.4 seconds and occurred during night.  Hyperlipidemia: On atorvastatin  10 mg daily.  LDL 34 on 09/2023  Hypertension: On losartan  25 mg daily, diltiazem  240 mg daily, and Toprol -XL 37.5 mg daily.   Appeasrs controlled.   OSA: reports compliance with CPAP  RTC in 6 months  Informed Consent   Shared Decision Making/Informed Consent The risks [chest pain, shortness of breath, cardiac arrhythmias, dizziness, blood pressure fluctuations, myocardial infarction, stroke/transient ischemic attack, nausea, vomiting, allergic reaction, radiation exposure, metallic taste sensation and life-threatening complications (estimated to be 1 in 10,000)], benefits (risk stratification, diagnosing coronary artery disease, treatment guidance) and alternatives of a cardiac PET stress test were discussed in detail with Ms. Coppinger and she agrees to proceed.  Medication Adjustments/Labs and Tests Ordered: Current medicines are reviewed at length with the patient today.  Concerns regarding medicines are outlined above.  Orders Placed This Encounter  Procedures   NM PET CT CARDIAC PERFUSION MULTI W/ABSOLUTE BLOODFLOW   EKG 12-Lead   No orders of the defined types were placed in this encounter.   Patient Instructions  Medication Instructions:  Your physician recommends that you continue on your current medications as directed. Please refer to the Current Medication list given to you today.  *If you need a refill on your cardiac medications before your next appointment, please call your  pharmacy*  Lab Work: none If you have labs (blood work) drawn today and your tests are completely normal, you will receive your results only by: MyChart Message (if you have MyChart) OR A paper copy in the mail If you have any lab test that is abnormal or we need to change your treatment, we will call you to review the results.  Testing/Procedures:    Please report to Radiology at the [facility] Main Entrance 30 minutes early for your test.  [address] [place], [place] [address]                         How to Prepare for Your Cardiac PET/CT Stress Test:  Nothing to eat or drink, except water, 3 hours prior to arrival time.  NO caffeine/decaffeinated products, or chocolate 12 hours prior to arrival. (Please note decaffeinated beverages (teas/coffees) still contain caffeine).  If you have caffeine within 12 hours prior, the test will need to be rescheduled.  Medication instructions: Do not take erectile dysfunction medications for 72 hours prior to test (sildenafil, tadalafil) Do not take nitrates (isosorbide mononitrate, Ranexa) the day before or day of test Do not take tamsulosin the day before or morning of test Hold theophylline containing medications for 12 hours. Hold Dipyridamole 48 hours prior to the test.  Diabetic Preparation: If able to eat breakfast prior to 3 hour fasting, you may take all medications, including your insulin. Do not worry if you miss your breakfast dose of insulin - start at your next meal. If you do not eat prior to 3 hour fast-Hold all diabetes (oral and insulin) medications. Patients who wear a continuous glucose monitor MUST remove the device prior to scanning.  You may take your remaining medications with water.  NO perfume, cologne or lotion on chest or abdomen area. FEMALES - Please avoid wearing dresses to this appointment.  Total time is 1 to 2 hours; you may want to bring reading material for the waiting time.  IF YOU  THINK YOU MAY BE PREGNANT, OR ARE NURSING PLEASE INFORM THE TECHNOLOGIST.  In preparation for your appointment, medication and supplies will be purchased.  Appointment availability is limited, so if you need to cancel or reschedule, please call the Radiology Department Scheduler at [phone] 24 hours in advance to avoid a cancellation fee of $100.00  What to Expect When you Arrive:  Once you arrive and check in for your appointment, you will be taken to a preparation room within the Radiology Department.  A technologist or Nurse will obtain your medical history, verify that you are correctly prepped for the exam, and explain the procedure.  Afterwards, an IV will be started in your arm and electrodes will be placed on your skin for EKG monitoring during the stress portion of the exam. Then you will be escorted to  the PET/CT scanner.  There, staff will get you positioned on the scanner and obtain a blood pressure and EKG.  During the exam, you will continue to be connected to the EKG and blood pressure machines.  A small, safe amount of a radioactive tracer will be injected in your IV to obtain a series of pictures of your heart along with an injection of a stress agent.    After your Exam:  It is recommended that you eat a meal and drink a caffeinated beverage to counter act any effects of the stress agent.  Drink plenty of fluids for the remainder of the day and urinate frequently for the first couple of hours after the exam.  Your doctor will inform you of your test results within 7-10 business days.  For more information and frequently asked questions, please visit our website: [email or web address]  For questions about your test or how to prepare for your test, please call: Cardiac Imaging Nurse Navigators Office: [phone]   Follow-Up: At [facility], you and your health needs are our priority.  As part of our continuing mission to provide you with exceptional  heart care, our providers are all part of one team.  This team includes your primary Cardiologist (physician) and Advanced Practice Providers or APPs (Physician Assistants and Nurse Practitioners) who all work together to provide you with the care you need, when you need it.  Your next appointment:   6 months  Provider:   Dr. Kate We recommend signing up for the patient portal called MyChart.  Sign up information is provided on this After Visit Summary.  MyChart is used to connect with patients for Virtual Visits (Telemedicine).  Patients are able to view lab/test results, encounter notes, upcoming appointments, etc.  Non-urgent messages can be sent to your provider as well.   To learn more about what you can do with MyChart, go to [email or web address].   Other Instructions none             Signed, Lonni LITTIE Kate, MD  06/21/2024 4:03 PM    [facility] "

## 2024-06-19 ENCOUNTER — Other Ambulatory Visit: Payer: Self-pay | Admitting: Internal Medicine

## 2024-06-21 ENCOUNTER — Encounter: Payer: Self-pay | Admitting: Cardiology

## 2024-06-21 ENCOUNTER — Ambulatory Visit: Attending: Cardiology | Admitting: Cardiology

## 2024-06-21 VITALS — BP 128/78 | HR 92 | Ht 65.0 in | Wt 215.0 lb

## 2024-06-21 DIAGNOSIS — R0602 Shortness of breath: Secondary | ICD-10-CM | POA: Insufficient documentation

## 2024-06-21 DIAGNOSIS — E785 Hyperlipidemia, unspecified: Secondary | ICD-10-CM | POA: Insufficient documentation

## 2024-06-21 DIAGNOSIS — R072 Precordial pain: Secondary | ICD-10-CM | POA: Diagnosis present

## 2024-06-21 DIAGNOSIS — I10 Essential (primary) hypertension: Secondary | ICD-10-CM | POA: Insufficient documentation

## 2024-06-21 DIAGNOSIS — I471 Supraventricular tachycardia, unspecified: Secondary | ICD-10-CM | POA: Insufficient documentation

## 2024-06-21 NOTE — Patient Instructions (Signed)
 Medication Instructions:  Your physician recommends that you continue on your current medications as directed. Please refer to the Current Medication list given to you today.  *If you need a refill on your cardiac medications before your next appointment, please call your pharmacy*  Lab Work: none If you have labs (blood work) drawn today and your tests are completely normal, you will receive your results only by: MyChart Message (if you have MyChart) OR A paper copy in the mail If you have any lab test that is abnormal or we need to change your treatment, we will call you to review the results.  Testing/Procedures:    Please report to Radiology at the [facility] Main Entrance 30 minutes early for your test.  [address] [place], [place] [address]                         How to Prepare for Your Cardiac PET/CT Stress Test:  Nothing to eat or drink, except water, 3 hours prior to arrival time.  NO caffeine/decaffeinated products, or chocolate 12 hours prior to arrival. (Please note decaffeinated beverages (teas/coffees) still contain caffeine).  If you have caffeine within 12 hours prior, the test will need to be rescheduled.  Medication instructions: Do not take erectile dysfunction medications for 72 hours prior to test (sildenafil, tadalafil) Do not take nitrates (isosorbide mononitrate, Ranexa) the day before or day of test Do not take tamsulosin the day before or morning of test Hold theophylline containing medications for 12 hours. Hold Dipyridamole 48 hours prior to the test.  Diabetic Preparation: If able to eat breakfast prior to 3 hour fasting, you may take all medications, including your insulin. Do not worry if you miss your breakfast dose of insulin - start at your next meal. If you do not eat prior to 3 hour fast-Hold all diabetes (oral and insulin) medications. Patients who wear a continuous glucose monitor MUST remove the device prior to  scanning.  You may take your remaining medications with water.  NO perfume, cologne or lotion on chest or abdomen area. FEMALES - Please avoid wearing dresses to this appointment.  Total time is 1 to 2 hours; you may want to bring reading material for the waiting time.  IF YOU THINK YOU MAY BE PREGNANT, OR ARE NURSING PLEASE INFORM THE TECHNOLOGIST.  In preparation for your appointment, medication and supplies will be purchased.  Appointment availability is limited, so if you need to cancel or reschedule, please call the Radiology Department Scheduler at [phone] 24 hours in advance to avoid a cancellation fee of $100.00  What to Expect When you Arrive:  Once you arrive and check in for your appointment, you will be taken to a preparation room within the Radiology Department.  A technologist or Nurse will obtain your medical history, verify that you are correctly prepped for the exam, and explain the procedure.  Afterwards, an IV will be started in your arm and electrodes will be placed on your skin for EKG monitoring during the stress portion of the exam. Then you will be escorted to the PET/CT scanner.  There, staff will get you positioned on the scanner and obtain a blood pressure and EKG.  During the exam, you will continue to be connected to the EKG and blood pressure machines.  A small, safe amount of a radioactive tracer will be injected in your IV to obtain a series of pictures of your heart along  with an injection of a stress agent.    After your Exam:  It is recommended that you eat a meal and drink a caffeinated beverage to counter act any effects of the stress agent.  Drink plenty of fluids for the remainder of the day and urinate frequently for the first couple of hours after the exam.  Your doctor will inform you of your test results within 7-10 business days.  For more information and frequently asked questions, please visit our  website: [email or web address]  For questions about your test or how to prepare for your test, please call: Cardiac Imaging Nurse Navigators Office: [phone]   Follow-Up: At [facility], you and your health needs are our priority.  As part of our continuing mission to provide you with exceptional heart care, our providers are all part of one team.  This team includes your primary Cardiologist (physician) and Advanced Practice Providers or APPs (Physician Assistants and Nurse Practitioners) who all work together to provide you with the care you need, when you need it.  Your next appointment:   6 months  Provider:   Dr. Kate We recommend signing up for the patient portal called MyChart.  Sign up information is provided on this After Visit Summary.  MyChart is used to connect with patients for Virtual Visits (Telemedicine).  Patients are able to view lab/test results, encounter notes, upcoming appointments, etc.  Non-urgent messages can be sent to your provider as well.   To learn more about what you can do with MyChart, go to [email or web address].   Other Instructions none

## 2024-07-24 ENCOUNTER — Other Ambulatory Visit (HOSPITAL_COMMUNITY)

## 2024-08-17 ENCOUNTER — Ambulatory Visit: Admitting: Internal Medicine

## 2024-08-28 ENCOUNTER — Ambulatory Visit (INDEPENDENT_AMBULATORY_CARE_PROVIDER_SITE_OTHER): Admitting: Otolaryngology

## 2024-11-02 ENCOUNTER — Ambulatory Visit

## 2025-04-30 ENCOUNTER — Ambulatory Visit: Admitting: Family Medicine
